# Patient Record
Sex: Female | Born: 1940 | Race: Black or African American | Hispanic: No | Smoking: Former smoker
Health system: Southern US, Community
[De-identification: ages and names within clinical notes are randomized; demographics above are authoritative.]

## PROBLEM LIST (undated history)

## (undated) DIAGNOSIS — I10 Essential (primary) hypertension: Secondary | ICD-10-CM

## (undated) DIAGNOSIS — E119 Type 2 diabetes mellitus without complications: Secondary | ICD-10-CM

## (undated) DIAGNOSIS — J45909 Unspecified asthma, uncomplicated: Secondary | ICD-10-CM

## (undated) DIAGNOSIS — M1990 Unspecified osteoarthritis, unspecified site: Secondary | ICD-10-CM

## (undated) DIAGNOSIS — J302 Other seasonal allergic rhinitis: Secondary | ICD-10-CM

## (undated) DIAGNOSIS — K219 Gastro-esophageal reflux disease without esophagitis: Secondary | ICD-10-CM

## (undated) DIAGNOSIS — M75101 Unspecified rotator cuff tear or rupture of right shoulder, not specified as traumatic: Secondary | ICD-10-CM

## (undated) DIAGNOSIS — Z860100 Personal history of colon polyps, unspecified: Secondary | ICD-10-CM

## (undated) DIAGNOSIS — I499 Cardiac arrhythmia, unspecified: Secondary | ICD-10-CM

## (undated) DIAGNOSIS — J189 Pneumonia, unspecified organism: Secondary | ICD-10-CM

## (undated) HISTORY — PX: EYE SURGERY: SHX253

## (undated) HISTORY — PX: TONSILLECTOMY: SUR1361

## (undated) HISTORY — PX: VAGINAL HYSTERECTOMY: SUR661

## (undated) HISTORY — PX: BACK SURGERY: SHX140

## (undated) HISTORY — PX: INGUINAL HERNIA REPAIR: SHX194

## (undated) HISTORY — PX: HERNIA REPAIR: SHX51

## (undated) HISTORY — PX: LUMBAR DISC SURGERY: SHX700

## (undated) HISTORY — DX: Unspecified osteoarthritis, unspecified site: M19.90

## (undated) HISTORY — PX: CHOLECYSTECTOMY OPEN: SUR202

---

## 2004-02-25 ENCOUNTER — Encounter: Payer: Self-pay | Admitting: Internal Medicine

## 2004-09-24 ENCOUNTER — Ambulatory Visit: Payer: Self-pay | Admitting: Internal Medicine

## 2004-12-23 ENCOUNTER — Ambulatory Visit: Payer: Self-pay | Admitting: Internal Medicine

## 2005-01-26 ENCOUNTER — Ambulatory Visit: Payer: Self-pay | Admitting: Internal Medicine

## 2005-01-27 ENCOUNTER — Ambulatory Visit: Payer: Self-pay | Admitting: Internal Medicine

## 2005-02-26 ENCOUNTER — Encounter: Payer: Self-pay | Admitting: Internal Medicine

## 2005-02-26 ENCOUNTER — Ambulatory Visit: Payer: Self-pay | Admitting: Internal Medicine

## 2005-03-31 ENCOUNTER — Ambulatory Visit: Payer: Self-pay | Admitting: Internal Medicine

## 2005-04-30 ENCOUNTER — Ambulatory Visit: Payer: Self-pay | Admitting: Internal Medicine

## 2005-08-31 ENCOUNTER — Ambulatory Visit: Payer: Self-pay | Admitting: Internal Medicine

## 2006-02-08 ENCOUNTER — Ambulatory Visit: Payer: Self-pay | Admitting: Internal Medicine

## 2006-06-07 ENCOUNTER — Ambulatory Visit: Payer: Self-pay | Admitting: Internal Medicine

## 2006-09-30 ENCOUNTER — Ambulatory Visit: Payer: Self-pay | Admitting: Internal Medicine

## 2006-09-30 LAB — CONVERTED CEMR LAB
AST: 21 units/L (ref 0–37)
Albumin: 4.2 g/dL (ref 3.5–5.2)
Alkaline Phosphatase: 53 units/L (ref 39–117)
BUN: 15 mg/dL (ref 6–23)
CO2: 30 meq/L (ref 19–32)
Chloride: 101 meq/L (ref 96–112)
Creatinine, Ser: 0.8 mg/dL (ref 0.4–1.2)
HCT: 39.2 % (ref 36.0–46.0)
MCHC: 34 g/dL (ref 30.0–36.0)
MCV: 91 fL (ref 78.0–100.0)
Potassium: 3.9 meq/L (ref 3.5–5.1)
Sodium: 141 meq/L (ref 135–145)
Total Bilirubin: 0.6 mg/dL (ref 0.3–1.2)
Total Protein: 6.7 g/dL (ref 6.0–8.3)
VLDL: 16 mg/dL (ref 0–40)
WBC: 5.9 10*3/uL (ref 4.5–10.5)

## 2007-04-04 ENCOUNTER — Ambulatory Visit: Payer: Self-pay | Admitting: Internal Medicine

## 2007-04-07 ENCOUNTER — Encounter: Payer: Self-pay | Admitting: Internal Medicine

## 2007-04-07 DIAGNOSIS — I10 Essential (primary) hypertension: Secondary | ICD-10-CM | POA: Insufficient documentation

## 2007-04-07 DIAGNOSIS — J45909 Unspecified asthma, uncomplicated: Secondary | ICD-10-CM

## 2007-04-07 DIAGNOSIS — J309 Allergic rhinitis, unspecified: Secondary | ICD-10-CM

## 2007-04-07 DIAGNOSIS — Z860100 Personal history of colon polyps, unspecified: Secondary | ICD-10-CM | POA: Insufficient documentation

## 2007-04-07 DIAGNOSIS — K219 Gastro-esophageal reflux disease without esophagitis: Secondary | ICD-10-CM

## 2007-10-04 ENCOUNTER — Ambulatory Visit: Payer: Self-pay | Admitting: Internal Medicine

## 2007-12-14 ENCOUNTER — Ambulatory Visit: Payer: Self-pay | Admitting: Internal Medicine

## 2008-02-05 ENCOUNTER — Encounter: Payer: Self-pay | Admitting: Internal Medicine

## 2008-02-06 ENCOUNTER — Ambulatory Visit: Payer: Self-pay | Admitting: Internal Medicine

## 2008-02-06 LAB — CONVERTED CEMR LAB
ALT: 33 units/L (ref 0–35)
Albumin: 4.1 g/dL (ref 3.5–5.2)
BUN: 13 mg/dL (ref 6–23)
Basophils Relative: 0 % (ref 0.0–1.0)
Bilirubin, Direct: 0.1 mg/dL (ref 0.0–0.3)
CO2: 33 meq/L — ABNORMAL HIGH (ref 19–32)
Calcium: 9.9 mg/dL (ref 8.4–10.5)
Cholesterol: 241 mg/dL (ref 0–200)
Creatinine, Ser: 0.7 mg/dL (ref 0.4–1.2)
Eosinophils Relative: 2.4 % (ref 0.0–5.0)
GFR calc Af Amer: 107 mL/min
Glucose, Bld: 101 mg/dL — ABNORMAL HIGH (ref 70–99)
HCT: 40.4 % (ref 36.0–46.0)
Hemoglobin: 13.2 g/dL (ref 12.0–15.0)
Lymphocytes Relative: 33.6 % (ref 12.0–46.0)
Monocytes Absolute: 0.4 10*3/uL (ref 0.1–1.0)
Monocytes Relative: 5.5 % (ref 3.0–12.0)
Neutro Abs: 4.1 10*3/uL (ref 1.4–7.7)
RBC: 4.38 M/uL (ref 3.87–5.11)
RDW: 13.1 % (ref 11.5–14.6)
TSH: 0.67 microintl units/mL (ref 0.35–5.50)
Total CHOL/HDL Ratio: 3
Total Protein: 7.5 g/dL (ref 6.0–8.3)
VLDL: 16 mg/dL (ref 0–40)

## 2008-08-07 ENCOUNTER — Ambulatory Visit: Payer: Self-pay | Admitting: Internal Medicine

## 2009-03-14 ENCOUNTER — Ambulatory Visit: Payer: Self-pay | Admitting: Internal Medicine

## 2009-04-10 ENCOUNTER — Encounter: Admission: RE | Admit: 2009-04-10 | Discharge: 2009-04-10 | Payer: Self-pay | Admitting: Orthopedic Surgery

## 2009-09-16 ENCOUNTER — Ambulatory Visit: Payer: Self-pay | Admitting: Internal Medicine

## 2009-09-16 DIAGNOSIS — M549 Dorsalgia, unspecified: Secondary | ICD-10-CM | POA: Insufficient documentation

## 2009-09-16 LAB — CONVERTED CEMR LAB
ALT: 31 units/L (ref 0–35)
BUN: 13 mg/dL (ref 6–23)
Basophils Relative: 0.4 % (ref 0.0–3.0)
CO2: 27 meq/L (ref 19–32)
Chloride: 105 meq/L (ref 96–112)
Cholesterol: 219 mg/dL — ABNORMAL HIGH (ref 0–200)
Direct LDL: 137 mg/dL
Eosinophils Absolute: 0.2 10*3/uL (ref 0.0–0.7)
Eosinophils Relative: 3.5 % (ref 0.0–5.0)
HCT: 39.7 % (ref 36.0–46.0)
Lymphs Abs: 2.3 10*3/uL (ref 0.7–4.0)
MCHC: 32.8 g/dL (ref 30.0–36.0)
MCV: 93.7 fL (ref 78.0–100.0)
Monocytes Absolute: 0.5 10*3/uL (ref 0.1–1.0)
Platelets: 311 10*3/uL (ref 150.0–400.0)
Potassium: 3.6 meq/L (ref 3.5–5.1)
RBC: 4.24 M/uL (ref 3.87–5.11)
TSH: 1.09 microintl units/mL (ref 0.35–5.50)
Total Protein: 7.7 g/dL (ref 6.0–8.3)
VLDL: 23.2 mg/dL (ref 0.0–40.0)
WBC: 6.4 10*3/uL (ref 4.5–10.5)

## 2010-02-17 ENCOUNTER — Encounter (INDEPENDENT_AMBULATORY_CARE_PROVIDER_SITE_OTHER): Payer: Self-pay | Admitting: *Deleted

## 2010-03-12 ENCOUNTER — Ambulatory Visit: Payer: Self-pay | Admitting: Internal Medicine

## 2010-09-09 ENCOUNTER — Ambulatory Visit: Payer: Self-pay | Admitting: Internal Medicine

## 2010-09-09 DIAGNOSIS — K089 Disorder of teeth and supporting structures, unspecified: Secondary | ICD-10-CM | POA: Insufficient documentation

## 2010-09-19 ENCOUNTER — Encounter: Payer: Self-pay | Admitting: Internal Medicine

## 2010-09-25 ENCOUNTER — Encounter: Payer: Self-pay | Admitting: Internal Medicine

## 2010-11-16 LAB — CONVERTED CEMR LAB
ALT: 16 units/L (ref 0–35)
BUN: 19 mg/dL (ref 6–23)
Basophils Absolute: 0.1 10*3/uL (ref 0.0–0.1)
CO2: 31 meq/L (ref 19–32)
Chloride: 101 meq/L (ref 96–112)
Direct LDL: 118.1 mg/dL
Eosinophils Absolute: 0.2 10*3/uL (ref 0.0–0.7)
Eosinophils Relative: 2.6 % (ref 0.0–5.0)
Glucose, Bld: 85 mg/dL (ref 70–99)
HCT: 37.8 % (ref 36.0–46.0)
Lymphs Abs: 2.4 10*3/uL (ref 0.7–4.0)
MCHC: 33.8 g/dL (ref 30.0–36.0)
MCV: 92.5 fL (ref 78.0–100.0)
Monocytes Absolute: 0.5 10*3/uL (ref 0.1–1.0)
Platelets: 305 10*3/uL (ref 150.0–400.0)
Potassium: 3.8 meq/L (ref 3.5–5.1)
RDW: 13.9 % (ref 11.5–14.6)
TSH: 1.52 microintl units/mL (ref 0.35–5.50)
Total Bilirubin: 0.4 mg/dL (ref 0.3–1.2)

## 2010-11-18 NOTE — Assessment & Plan Note (Signed)
Summary: 6 month rov/njr   Vital Signs:  Patient profile:   70 year old female Weight:      154 pounds Temp:     98.0 degrees F oral BP sitting:   160 / 100  (left arm) Cuff size:   regular  Vitals Entered By: Duard Brady LPN (September 09, 2010 8:13 AM) CC: 6 mos rov - doing ok - pain r/t dental procedure Is Patient Diabetic? No   CC:  6 mos rov - doing ok - pain r/t dental procedure.  History of Present Illness: 70 year old patient who is being followed closely by her dentist due to a dental pain.  She apparent has had a recent bone grafting and is having considerable pain.  She is on Vicodin and also having considerable nausea.  The pain has not been well controlled.  She is also on ibuprofen.  Seen today for follow-up of her asthma and hypertension.  Earlier in the week,  Her blood pressure was low normal.  She has not slept much last night due to the pain.  Her asthma has been stable.  She also has gastroesophageal reflux disease, currently controlled with lifestyle issues.  Allergies: 1)  ! Asa 2)  ! Catapres  Past History:  Past Medical History: Reviewed history from 04/07/2007 and no changes required. Allergic rhinitis Asthma Colonic polyps, hx of GERD Hypertension  Past Surgical History: Reviewed history from 02/06/2008 and no changes required. Cholecystectomy Hysterectomy Lumbar laminectomy colonoscopy May 2006  Review of Systems  The patient denies anorexia, fever, weight loss, weight gain, vision loss, decreased hearing, hoarseness, chest pain, syncope, dyspnea on exertion, peripheral edema, prolonged cough, headaches, hemoptysis, abdominal pain, melena, hematochezia, severe indigestion/heartburn, hematuria, incontinence, genital sores, muscle weakness, suspicious skin lesions, transient blindness, difficulty walking, depression, unusual weight change, abnormal bleeding, enlarged lymph nodes, angioedema, and breast masses.    Physical Exam  General:   overweight-appearing.  140/96 Head:  Normocephalic and atraumatic without obvious abnormalities. No apparent alopecia or balding. Eyes:  No corneal or conjunctival inflammation noted. EOMI. Perrla. Funduscopic exam benign, without hemorrhages, exudates or papilledema. Vision grossly normal. Mouth:  Oral mucosa and oropharynx without lesions or exudates.  Teeth in good repair. Neck:  No deformities, masses, or tenderness noted. Lungs:  Normal respiratory effort, chest expands symmetrically. Lungs are clear to auscultation, no crackles or wheezes. Heart:  Normal rate and regular rhythm. S1 and S2 normal without gallop, murmur, click, rub or other extra sounds. Abdomen:  Bowel sounds positive,abdomen soft and non-tender without masses, organomegaly or hernias noted. Msk:  No deformity or scoliosis noted of thoracic or lumbar spine.   Pulses:  R and L carotid,radial,femoral,dorsalis pedis and posterior tibial pulses are full and equal bilaterally Extremities:  No clubbing, cyanosis, edema, or deformity noted with normal full range of motion of all joints.   Skin:  Intact without suspicious lesions or rashes Cervical Nodes:  No lymphadenopathy noted   Impression & Recommendations:  Problem # 1:  HYPERTENSION (ICD-401.9)  Her updated medication list for this problem includes:    Hydrochlorothiazide 25 Mg Tabs (Hydrochlorothiazide) .Marland Kitchen... 1 once daily    Benazepril Hcl 40 Mg Tabs (Benazepril hcl) .Marland Kitchen... 1 once daily  Problem # 2:  GERD (ICD-530.81)  Problem # 3:  ASTHMA (ICD-493.90)  Her updated medication list for this problem includes:    Albuterol 90 Mcg/act Aers (Albuterol) .Marland Kitchen... As needed    Advair Diskus 250-50 Mcg/dose Misc (Fluticasone-salmeterol) ..... Use two times a day as needed  Problem # 4:  UNSPECIFIED DISORDER TEETH&SUPPORTING STRUCTURES (ICD-525.9) will give a prescription for a small quantity of oxycodone ; will follow-up with her dentist  Complete Medication List: 1)   Allegra-d 12 Hour 60-120 Mg Tb12 (Fexofenadine-pseudoephedrine) 2)  Hydrochlorothiazide 25 Mg Tabs (Hydrochlorothiazide) .Marland Kitchen.. 1 once daily 3)  Benazepril Hcl 40 Mg Tabs (Benazepril hcl) .Marland Kitchen.. 1 once daily 4)  Albuterol 90 Mcg/act Aers (Albuterol) .... As needed 5)  Advair Diskus 250-50 Mcg/dose Misc (Fluticasone-salmeterol) .... Use two times a day as needed 6)  Celebrex 200 Mg Caps (Celecoxib) .Marland Kitchen.. 1 once daily 7)  Tramadol Hcl 50 Mg Tabs (Tramadol hcl) .... One every 6 hours for pain 8)  Amoxicillin 500 Mg Caps (Amoxicillin) .... Three times a day - dental procedure 9)  Vicodin Es 7.5-750 Mg Tabs (Hydrocodone-acetaminophen) .... As needed - dental procdure 10)  Oxycodone-acetaminophen 5-500 Mg Caps (Oxycodone-acetaminophen) .... One every 4 hours as needed for pain  Patient Instructions: 1)  Please schedule a follow-up appointment in 4 months. 2)  Limit your Sodium (Salt). 3)  It is important that you exercise regularly at least 20 minutes 5 times a week. If you develop chest pain, have severe difficulty breathing, or feel very tired , stop exercising immediately and seek medical attention. 4)  You need to lose weight. Consider a lower calorie diet and regular exercise.  5)  Check your Blood Pressure regularly. If it is above: 150/90 you should make an appointment. Prescriptions: OXYCODONE-ACETAMINOPHEN 5-500 MG CAPS (OXYCODONE-ACETAMINOPHEN) one every 4 hours as needed for pain  #30 x 0   Entered and Authorized by:   Gordy Savers  MD   Signed by:   Gordy Savers  MD on 09/09/2010   Method used:   Print then Give to Patient   RxID:   [id number] TRAMADOL HCL 50 MG TABS (TRAMADOL HCL) one every 6 hours for pain  #90 x 6   Entered and Authorized by:   Gordy Savers  MD   Signed by:   Gordy Savers  MD on 09/09/2010   Method used:   Print then Give to Patient   RxID:   [id number] CELEBREX 200 MG CAPS (CELECOXIB) 1 once daily  #90 x 4   Entered and  Authorized by:   Gordy Savers  MD   Signed by:   Gordy Savers  MD on 09/09/2010   Method used:   Print then Give to Patient   RxID:   [id number] ADVAIR DISKUS 250-50 MCG/DOSE  MISC (FLUTICASONE-SALMETEROL) use two times a day as needed  #60 Each x 6   Entered and Authorized by:   Gordy Savers  MD   Signed by:   Gordy Savers  MD on 09/09/2010   Method used:   Print then Give to Patient   RxID:   [id number] BENAZEPRIL HCL 40 MG  TABS (BENAZEPRIL HCL) 1 once daily  #90 Tablet x 6   Entered and Authorized by:   Gordy Savers  MD   Signed by:   Gordy Savers  MD on 09/09/2010   Method used:   Print then Give to Patient   RxID:   [id number] HYDROCHLOROTHIAZIDE 25 MG  TABS (HYDROCHLOROTHIAZIDE) 1 once daily  #90 Tablet x 6   Entered and Authorized by:   Gordy Savers  MD   Signed by:   Gordy Savers  MD on 09/09/2010   Method used:   Print then Give to Patient  RxID:   [id number]    Orders Added: 1)  Est. Patient Level IV [14782]

## 2010-11-18 NOTE — Letter (Signed)
Summary: Colonoscopy Letter  [facility]  [address] [place], [place] [address]   Phone: [phone]  Fax: [phone]      Feb 17, 2010 MRN: [id number]   [facility] [address] [place], [place]  [address]   Dear [name],   According to your medical record, it is time for you to schedule a Colonoscopy. The American Cancer Society recommends this procedure as a method to detect early colon cancer. Patients with a family history of colon cancer, or a personal history of colon polyps or inflammatory bowel disease are at increased risk.  This letter has beeen generated based on the recommendations made at the time of your procedure. If you feel that in your particular situation this may no longer apply, please contact our office.  Please call our office at [phone] to schedule this appointment or to update your records at your earliest convenience.  Thank you for cooperating with [place] to provide you with the very best care possible.   Sincerely,    Wilhemina Bonito. Marina Goodell, M.D.  [facility] [phone]

## 2010-11-18 NOTE — Assessment & Plan Note (Signed)
Summary: EMP PT WILL FAST//SLM   Vital Signs:  Patient profile:   70 year old female Height:      60.5 inches Weight:      153 pounds Temp:     97.8 degrees F oral  Vitals Entered By: Duard Brady LPN (Mar 12, 2010 8:44 AM) CC: cpx - doing well Is Patient Diabetic? No   CC:  cpx - doing well.  History of Present Illness: 70 year old patient who is seen today for a health maintenance examination.  She has a history of asthma, which has been stable.  She has treated hypertension.  No concerns or complaints.  She does have a history of colonic polyps and is due for a follow-up colonoscopy soon.  She is scheduled for a mammogram in two days  Here for Medicare AWV:  1.   Risk factors based on Past M, S, F history:  as factors include hypertension, and a family history of premature coronary artery disease.  Females in her family have enjoyed longevity often in excess of 100 years 2.   Physical Activities: active with gardening.  No exercise limitations 3.   Depression/mood: none 4.   Hearing: no impairment 5.   ADL's: independent in all aspects of daily living 6.   Fall Risk: low 7.   Home Safety: the proms identifying 8.   Height, weight, &visual acuity:height and weight stable.  No visual deficits 9.   Counseling: mammogram and colonoscopy to be scheduled 10.   Labs ordered based on risk factors: laboratory profile, including lipid panel will be reviewed 11.           Referral Coordination- colonoscopy referral 12.           Care Plan- heart healthy diet, and more active lifestyle, discussed and encouraged 13.            Cognitive Assessment- alert and oriented, with normal affect.  No cognitive dysfunction   Preventive Screening-Counseling & Management  Alcohol-Tobacco     Smoking Status: quit  Allergies: 1)  ! Asa 2)  ! Catapres  Past History:  Past Medical History: Reviewed history from 04/07/2007 and no changes required. Allergic rhinitis Asthma Colonic  polyps, hx of GERD Hypertension  Past Surgical History: Reviewed history from 02/06/2008 and no changes required. Cholecystectomy Hysterectomy Lumbar laminectomy colonoscopy May 2006  Family History: Reviewed history from 02/06/2008 and no changes required. father died age 34,myocardial infarction mother age 53 mild DJD-history breast cancer at age 20 Four half-brothers four half-sisters in good health  Social History: Reviewed history from 10/04/2007 and no changes required. Retired Married  Review of Systems  The patient denies anorexia, fever, weight loss, weight gain, vision loss, decreased hearing, hoarseness, chest pain, syncope, dyspnea on exertion, peripheral edema, prolonged cough, headaches, hemoptysis, abdominal pain, melena, hematochezia, severe indigestion/heartburn, hematuria, incontinence, genital sores, muscle weakness, suspicious skin lesions, transient blindness, difficulty walking, depression, unusual weight change, abnormal bleeding, enlarged lymph nodes, angioedema, and breast masses.    Physical Exam  General:  overweight-appearing.  130/82 Head:  Normocephalic and atraumatic without obvious abnormalities. No apparent alopecia or balding. Eyes:  No corneal or conjunctival inflammation noted. EOMI. Perrla. Funduscopic exam benign, without hemorrhages, exudates or papilledema. Vision grossly normal. Ears:  External ear exam shows no significant lesions or deformities.  Otoscopic examination reveals clear canals, tympanic membranes are intact bilaterally without bulging, retraction, inflammation or discharge. Hearing is grossly normal bilaterally. Nose:  External nasal examination shows no deformity or inflammation. Nasal  mucosa are pink and moist without lesions or exudates. Neck:  No deformities, masses, or tenderness noted. Chest Wall:  No deformities, masses, or tenderness noted. Breasts:  No mass, nodules, thickening, tenderness, bulging, retraction,  inflamation, nipple discharge or skin changes noted.   Lungs:  Normal respiratory effort, chest expands symmetrically. Lungs are clear to auscultation, no crackles or wheezes. Heart:  Normal rate and regular rhythm. S1 and S2 normal without gallop, murmur, click, rub or other extra sounds. Abdomen:  Bowel sounds positive,abdomen soft and non-tender without masses, organomegaly or hernias noted. Rectal:  No external abnormalities noted. Normal sphincter tone. No rectal masses or tenderness. Genitalia:  status post hysterectomynormal introitus, no external lesions, and mucosa pink and moist.  normal introitus, no external lesions, and mucosa pink and moist.   Msk:  No deformity or scoliosis noted of thoracic or lumbar spine.   Pulses:  R and L carotid,radial,femoral,dorsalis pedis and posterior tibial pulses are full and equal bilaterally Extremities:  No clubbing, cyanosis, edema, or deformity noted with normal full range of motion of all joints.   Neurologic:  No cranial nerve deficits noted. Station and gait are normal. Plantar reflexes are down-going bilaterally. DTRs are symmetrical throughout. Sensory, motor and coordinative functions appear intact. Skin:  Intact without suspicious lesions or rashes Cervical Nodes:  No lymphadenopathy noted Axillary Nodes:  No palpable lymphadenopathy Inguinal Nodes:  No significant adenopathy Psych:  Cognition and judgment appear intact. Alert and cooperative with normal attention span and concentration. No apparent delusions, illusions, hallucinations   Impression & Recommendations:  Problem # 1:  PREVENTIVE HEALTH CARE (ICD-V70.0)  Orders: First annual wellness visit with prevention plan  (Q0347)  Problem # 2:  GERD (ICD-530.81)  Orders: Prescription Created Electronically [id number]) Venipuncture (63875) TLB-Lipid Panel (80061-LIPID) TLB-BMP (Basic Metabolic Panel-BMET) (80048-METABOL) TLB-CBC Platelet - w/Differential  (85025-CBCD) TLB-Hepatic/Liver Function Pnl (80076-HEPATIC) TLB-TSH (Thyroid Stimulating Hormone) (84443-TSH)  Problem # 3:  ASTHMA (ICD-493.90)  Her updated medication list for this problem includes:    Albuterol 90 Mcg/act Aers (Albuterol) .Marland Kitchen... As needed    Advair Diskus 250-50 Mcg/dose Misc (Fluticasone-salmeterol) ..... Use two times a day as needed  Her updated medication list for this problem includes:    Albuterol 90 Mcg/act Aers (Albuterol) .Marland Kitchen... As needed    Advair Diskus 250-50 Mcg/dose Misc (Fluticasone-salmeterol) ..... Use two times a day as needed  Problem # 4:  COLONIC POLYPS, HX OF (ICD-V12.72)  Orders: Venipuncture (64332) TLB-BMP (Basic Metabolic Panel-BMET) (80048-METABOL) TLB-CBC Platelet - w/Differential (85025-CBCD) TLB-Hepatic/Liver Function Pnl (80076-HEPATIC) TLB-TSH (Thyroid Stimulating Hormone) (84443-TSH)  Problem # 5:  HYPERTENSION (ICD-401.9)  Her updated medication list for this problem includes:    Hydrochlorothiazide 25 Mg Tabs (Hydrochlorothiazide) .Marland Kitchen... 1 once daily    Benazepril Hcl 40 Mg Tabs (Benazepril hcl) .Marland Kitchen... 1 once daily  Orders: EKG w/ Interpretation (93000)  Her updated medication list for this problem includes:    Hydrochlorothiazide 25 Mg Tabs (Hydrochlorothiazide) .Marland Kitchen... 1 once daily    Benazepril Hcl 40 Mg Tabs (Benazepril hcl) .Marland Kitchen... 1 once daily  Complete Medication List: 1)  Allegra-d 12 Hour 60-120 Mg Tb12 (Fexofenadine-pseudoephedrine) 2)  Hydrochlorothiazide 25 Mg Tabs (Hydrochlorothiazide) .Marland Kitchen.. 1 once daily 3)  Benazepril Hcl 40 Mg Tabs (Benazepril hcl) .Marland Kitchen.. 1 once daily 4)  Albuterol 90 Mcg/act Aers (Albuterol) .... As needed 5)  Advair Diskus 250-50 Mcg/dose Misc (Fluticasone-salmeterol) .... Use two times a day as needed 6)  Celebrex 200 Mg Caps (Celecoxib) .Marland Kitchen.. 1 once daily 7)  Tramadol Hcl  50 Mg Tabs (Tramadol hcl) .... One every 6 hours for pain  Patient Instructions: 1)  Please schedule a follow-up  appointment in 6 months. 2)  Limit your Sodium (Salt) to less than 2 grams a day(slightly less than 1/2 a teaspoon) to prevent fluid retention, swelling, or worsening of symptoms. 3)  Avoid foods high in acid (tomatoes, citrus juices, spicy foods). Avoid eating within two hours of lying down or before exercising. Do not over eat; try smaller more frequent meals. Elevate head of bed twelve inches when sleeping. 4)  It is important that you exercise regularly at least 20 minutes 5 times a week. If you develop chest pain, have severe difficulty breathing, or feel very tired , stop exercising immediately and seek medical attention. 5)  You need to lose weight. Consider a lower calorie diet and regular exercise.  6)  Schedule a colonoscopy/sigmoidoscopy to help detect colon cancer. 7)  Take calcium +Vitamin D daily. 8)  Check your Blood Pressure regularly. If it is above: 150/90 you should make an appointment. Prescriptions: TRAMADOL HCL 50 MG TABS (TRAMADOL HCL) one every 6 hours for pain  #90 x 6   Entered and Authorized by:   Gordy Savers  MD   Signed by:   Gordy Savers  MD on 03/12/2010   Method used:   Electronically to        [facility] # [phone]* (retail)       [address] [place], [place]  [address]       Ph: [phone]       Fax: [phone]   RxID:   [id number] CELEBREX 200 MG CAPS (CELECOXIB) 1 once daily  #90 x 4   Entered and Authorized by:   Gordy Savers  MD   Signed by:   Gordy Savers  MD on 03/12/2010   Method used:   Electronically to        [facility] # [phone]* (retail)       [address] [place], [place]  [address]       Ph: [phone]       Fax: [phone]   RxID:   [id number] ADVAIR DISKUS 250-50 MCG/DOSE  MISC (FLUTICASONE-SALMETEROL) use two times a day as needed  #60 Each x 3   Entered and Authorized by:   Gordy Savers  MD   Signed by:   Gordy Savers  MD on 03/12/2010   Method used:    Electronically to        [facility] # [phone]* (retail)       [address] [place], [place]  [address]       Ph: [phone]       Fax: [phone]   RxID:   [id number] BENAZEPRIL HCL 40 MG  TABS (BENAZEPRIL HCL) 1 once daily  #90 x 6   Entered and Authorized by:   Gordy Savers  MD   Signed by:   Gordy Savers  MD on 03/12/2010   Method used:   Electronically to        [facility] # [phone]* (retail)       [address] [place], [place]  [address]       Ph: [phone]       Fax: [phone]  RxID:   [id number] HYDROCHLOROTHIAZIDE 25 MG  TABS (HYDROCHLOROTHIAZIDE) 1 once daily  #90 x 6   Entered and Authorized by:   Gordy Savers  MD   Signed by:   Gordy Savers  MD on 03/12/2010   Method used:   Electronically to        [facility] # [phone]* (retail)       [address] [place], [place]  [address]       Ph: [phone]       Fax: [phone]   RxID:   [id number]

## 2010-12-19 ENCOUNTER — Ambulatory Visit (INDEPENDENT_AMBULATORY_CARE_PROVIDER_SITE_OTHER): Payer: Medicare Other | Admitting: Internal Medicine

## 2010-12-19 ENCOUNTER — Encounter: Payer: Self-pay | Admitting: Internal Medicine

## 2010-12-19 DIAGNOSIS — M1990 Unspecified osteoarthritis, unspecified site: Secondary | ICD-10-CM

## 2010-12-19 DIAGNOSIS — J45909 Unspecified asthma, uncomplicated: Secondary | ICD-10-CM

## 2010-12-19 DIAGNOSIS — R197 Diarrhea, unspecified: Secondary | ICD-10-CM

## 2010-12-19 DIAGNOSIS — I10 Essential (primary) hypertension: Secondary | ICD-10-CM

## 2010-12-19 LAB — CBC WITH DIFFERENTIAL/PLATELET
Eosinophils Absolute: 0.2 10*3/uL (ref 0.0–0.7)
Eosinophils Relative: 2.6 % (ref 0.0–5.0)
MCHC: 33.8 g/dL (ref 30.0–36.0)
MCV: 91.6 fl (ref 78.0–100.0)
Monocytes Absolute: 0.7 10*3/uL (ref 0.1–1.0)
Neutrophils Relative %: 51.3 % (ref 43.0–77.0)
Platelets: 300 10*3/uL (ref 150.0–400.0)
WBC: 6.3 10*3/uL (ref 4.5–10.5)

## 2010-12-19 LAB — BASIC METABOLIC PANEL
GFR: 84.68 mL/min (ref >60.00)
Potassium: 3.7 mEq/L (ref 3.5–5.1)
Sodium: 139 mEq/L (ref 135–145)

## 2010-12-19 MED ORDER — DIPHENOXYLATE-ATROPINE 2.5-0.025 MG PO TABS: 1.0000 | ORAL_TABLET | Freq: Four times a day (QID) | ORAL | Status: DC | PRN

## 2010-12-19 NOTE — Progress Notes (Signed)
  Subjective:    Patient ID: Kristy [name], female    DOB: September 22, 1941, 70 y.o.   MRN: [id number]  HPI   70 year old patient who has a history of osteoarthritis. She takes Celebrex every other day. She has a history of hypertension which has been controlled on diuretic therapy as well as Lotensin. For the past 5 days she has had frequent watery diarrhea. She states that she had 12 loose bowel movements through the night over the past   12  Hours. There's been no fever nausea or vomiting. She has been maintained on a clear to full liquid diet which he tolerates well and has been consuming considerable fluids. She does have a history of colonic polyps and is scheduled for redo colonoscopy soon. No recent antibiotic use but has used ampicillin for a dental problem in November   Review of Systems  Constitutional: Negative.   HENT: Negative for hearing loss, congestion, sore throat, rhinorrhea, dental problem, sinus pressure and tinnitus.   Eyes: Negative for pain, discharge and visual disturbance.  Respiratory: Negative for cough and shortness of breath.   Cardiovascular: Negative for chest pain, palpitations and leg swelling.  Gastrointestinal: Positive for diarrhea. Negative for nausea, vomiting, abdominal pain, constipation, blood in stool, abdominal distention, anal bleeding and rectal pain.  Genitourinary: Negative for dysuria, urgency, frequency, hematuria, flank pain, vaginal bleeding, vaginal discharge, difficulty urinating, vaginal pain and pelvic pain.  Musculoskeletal: Negative for joint swelling, arthralgias and gait problem.  Skin: Negative for rash.  Neurological: Negative for dizziness, syncope, speech difficulty, weakness, numbness and headaches.  Hematological: Negative for adenopathy.  Psychiatric/Behavioral: Negative for behavioral problems, dysphoric mood and agitation. The patient is not nervous/anxious.        Objective:   Physical Exam  Constitutional: She is oriented to  person, place, and time. She appears well-developed and well-nourished. No distress.        Blood pressure 120/78  No distress  No tachycardia  HENT:  Head: Normocephalic.  Right Ear: External ear normal.  Left Ear: External ear normal.  Mouth/Throat: Oropharynx is clear and moist.  Eyes: Conjunctivae and EOM are normal. Pupils are equal, round, and reactive to light.  Neck: Normal range of motion. Neck supple. No thyromegaly present.  Cardiovascular: Normal rate, regular rhythm, normal heart sounds and intact distal pulses.   Pulmonary/Chest: Effort normal and breath sounds normal.  Abdominal: Soft. Bowel sounds are normal. She exhibits no distension and no mass. There is no tenderness. There is no rebound and no guarding.  Musculoskeletal: Normal range of motion.  Lymphadenopathy:    She has no cervical adenopathy.  Neurological: She is alert and oriented to person, place, and time.  Skin: Skin is warm and dry. No rash noted.  Psychiatric: She has a normal mood and affect. Her behavior is normal.          Assessment & Plan:   diarrhea.   We'll discontinue Celebrex and her blood pressure medications short-term. Will place on a clear liquid diet and advance as tolerated Will treat with Lomotil for diarrhea. 2 weeks of Align  Samples dispensed. CBC and electrolytes will be checked today. She will call Monday if not resolved or markedly improved  Hypertension- hold blood pressure medications at this time in view of her risk of volume repletion  Osteoarthritis- hold Celebrex at this time in view of her risk of volume of the lesion

## 2010-12-19 NOTE — Patient Instructions (Signed)
Drink clear liquids only for the next 24 hours,  then slowly add other liquids and food as you  tolerate them  take antidiarrheal medication as directed   Hold Celebrex hydrochlorothiazide and benazepril   call if worsening abdominal pain nausea or vomiting or any clinical worsening  Align  One daily for 2 weeks

## 2010-12-23 ENCOUNTER — Encounter (INDEPENDENT_AMBULATORY_CARE_PROVIDER_SITE_OTHER): Payer: Self-pay | Admitting: *Deleted

## 2010-12-30 NOTE — Letter (Signed)
Summary: Pre Visit Letter Revised  [facility]  [address] [place], [place] [address]   Phone: [phone]  Fax: [phone]        12/23/2010 MRN: [id number] [facility] [address] [place], [place]  [address]  [place]             Procedure Date:  01/20/2011 @ 1:30   Recall colon-Dr. Marina Goodell   Welcome to the Gastroenterology Division at [facility].    You are scheduled to see a nurse for your pre-procedure visit on 01/06/2011 at 2:00 on the 3rd floor at [facility], [address].  We ask that you try to arrive at our office 15 minutes prior to your appointment time to allow for check-in.  Please take a minute to review the attached form.  If you answer "Yes" to one or more of the questions on the first page, we ask that you call the person listed at your earliest opportunity.  If you answer "No" to all of the questions, please complete the rest of the form and bring it to your appointment.    Your nurse visit will consist of discussing your medical and surgical history, your immediate family medical history, and your medications.   If you are unable to list all of your medications on the form, please bring the medication bottles to your appointment and we will list them.  We will need to be aware of both prescribed and over the counter drugs.  We will need to know exact dosage information as well.    Please be prepared to read and sign documents such as consent forms, a financial agreement, and acknowledgement forms.  If necessary, and with your consent, a friend or relative is welcome to sit-in on the nurse visit with you.  Please bring your insurance card so that we may make a copy of it.  If your insurance requires a referral to see a specialist, please bring your referral form from your primary care physician.  No co-pay is required for this nurse visit.     If you cannot keep your appointment, please call [phone] to cancel or reschedule prior to  your appointment date.  This allows [place] the opportunity to schedule an appointment for another patient in need of care.    Thank you for choosing [facility] for your medical needs.  We appreciate the opportunity to care for you.  Please visit [place] at our website  to learn more about our practice.  Sincerely, The Gastroenterology Division

## 2011-01-06 ENCOUNTER — Ambulatory Visit (AMBULATORY_SURGERY_CENTER): Payer: Medicare Other

## 2011-01-06 VITALS — Ht 62.0 in | Wt 156.3 lb

## 2011-01-06 DIAGNOSIS — Z1211 Encounter for screening for malignant neoplasm of colon: Secondary | ICD-10-CM

## 2011-01-06 MED ORDER — PEG-KCL-NACL-NASULF-NA ASC-C 100 G PO SOLR: 1.0000 | Freq: Once | ORAL | Status: AC

## 2011-01-06 NOTE — Patient Instructions (Signed)
Pt to pick up prep with in 5 days 

## 2011-01-08 ENCOUNTER — Ambulatory Visit: Payer: Self-pay | Admitting: Internal Medicine

## 2011-01-10 ENCOUNTER — Other Ambulatory Visit: Payer: Self-pay | Admitting: Internal Medicine

## 2011-01-19 ENCOUNTER — Encounter: Payer: Self-pay | Admitting: Internal Medicine

## 2011-01-20 ENCOUNTER — Encounter: Payer: Self-pay | Admitting: Internal Medicine

## 2011-01-20 ENCOUNTER — Ambulatory Visit (AMBULATORY_SURGERY_CENTER): Payer: Medicare Other | Admitting: Internal Medicine

## 2011-01-20 VITALS — BP 155/87 | HR 80 | Temp 98.5°F | Resp 20 | Ht 61.5 in | Wt 155.0 lb

## 2011-01-20 DIAGNOSIS — Z86010 Personal history of colonic polyps: Secondary | ICD-10-CM

## 2011-01-20 DIAGNOSIS — Z1211 Encounter for screening for malignant neoplasm of colon: Secondary | ICD-10-CM

## 2011-01-20 NOTE — Patient Instructions (Addendum)
We would like to see you again for another colonoscopy in 5 yrs. Per Dr. Marina Goodell.    You had a normal test today!  Resume your medicines, except the ones located in the stop taking box.    Call us if you have any questions at  [phone]. Thank you.

## 2011-01-21 ENCOUNTER — Telehealth: Payer: Self-pay | Admitting: *Deleted

## 2011-01-21 NOTE — Telephone Encounter (Signed)

## 2011-03-06 NOTE — Assessment & Plan Note (Signed)
[facility]                                 ON-CALL NOTE   NAME:RIVESToy, Samarin                          MRN:          [id number]  DATE:12/17/2007                            DOB:          1941-09-23    TIME OF CALL:  9:24 a.m.   PHONE NUMBER:  [phone].   OBJECTIVE:  The patient wants an appointment, was seen on Wednesday by  Dr. Amador Cunas for bronchitis.  Was given Doxycycline 100 mg b.i.d.,  Advair twice a day, and Proventil twice a day, and was told to take  Mucinex, which she thinks is Robitussin, every 4 hours.  She is not  breathing any better and is going out of town tomorrow.  Wants to know  if anything else she can do, or if she needs an appointment.  I would  advise that her treatment is about maxed out at this point.  The only  thing she is lacking is oral steroids, which I do not think I would want  to give her, going out of town.  Was told to give her antibiotics and  her inhalers more time to work, as they have only been for a couple of  days.   PRIMARY CARE Travonna Swindle:  Dr. Amador Cunas.  Home office is [place].  She was told to go to the ER if she had acute problems breathing.     Arta Silence, MD  Electronically Signed    RNS/MedQ  DD: 12/17/2007  DT: 12/18/2007  Job #: [id number]

## 2011-03-06 NOTE — Assessment & Plan Note (Signed)
[facility]                            [facility] OFFICE NOTE   NAME:[name], [name]                          MRN:          [id number]  DATE:09/30/2006                            DOB:          Jan 24, 1941    A 70 year old African-American female seen today for an annual exam.  She has a history of hypertension, colonic polyps, menopausal syndrome.  She does remarkably well.  There is a history of asthma and allergic  rhinitis which has been stable.  She has had a prior laminectomy,  hysterectomy, and cholecystectomy in the past.  Had a colonoscopy in  2006.   FAMILY HISTORY:  Father died young of an MI at 77.  Mother is in her  late 81s.  Maternal grandmother died at age 65.  All siblings are in  good health.   EXAMINATION:  GENERAL:  Revealed a healthy-appearing black female who  appeared younger than her stated age.  Blood pressure was well  controlled.  SKIN:  Negative.  HEENT:  Fundi, ear, nose and throat clear.  NECK:  No  bruits or adenopathy.  CHEST:  Clear.  BREASTS:  Negative.  CARDIOVASCULAR:  Normal heart sounds, no murmurs.  ABDOMEN:  Benign.  PELVIC:  Revealed absent uterus, no adnexal masses.  RECTAL:  Deferred.  EXTREMITIES:  Revealed full peripheral pulses, no edema.  NEUROLOGIC:  Negative.   IMPRESSION:  1. Hypertension.  2. Asthma.  3. Seasonal allergic rhinitis.  4. Colonic polyps.   DISPOSITION:  Medical regimen unchanged.  Reassess in 6 months.     Gordy Savers, MD  Electronically Signed    PFK/MedQ  DD: 09/30/2006  DT: 09/30/2006  Job #: [id number]

## 2011-03-25 ENCOUNTER — Ambulatory Visit (INDEPENDENT_AMBULATORY_CARE_PROVIDER_SITE_OTHER): Payer: Medicare Other | Admitting: Internal Medicine

## 2011-03-25 ENCOUNTER — Encounter: Payer: Self-pay | Admitting: Internal Medicine

## 2011-03-25 DIAGNOSIS — I10 Essential (primary) hypertension: Secondary | ICD-10-CM

## 2011-03-25 DIAGNOSIS — J45909 Unspecified asthma, uncomplicated: Secondary | ICD-10-CM

## 2011-03-25 DIAGNOSIS — J309 Allergic rhinitis, unspecified: Secondary | ICD-10-CM

## 2011-03-25 DIAGNOSIS — K219 Gastro-esophageal reflux disease without esophagitis: Secondary | ICD-10-CM

## 2011-03-25 MED ORDER — TRAMADOL HCL 50 MG PO TABS: 50.0000 mg | ORAL_TABLET | Freq: Four times a day (QID) | ORAL | Status: DC | PRN

## 2011-03-25 NOTE — Progress Notes (Signed)
  Subjective:    Patient ID: Kristy [name], female    DOB: Apr 22, 1941, 70 y.o.   MRN: [id number]  HPI  70 year old patient who is seen today for followup of her hypertension. She remains quite active in spite of caring for a disabled husband. Her main complaint is of left knee pain. She has had arthroscopic surgery and will require totally replacement therapy when able. She has a allergic rhinitis and asthma which has been stable. She had a fairly uneventful spring. She uses very little albuterol rescue medication. She is maintained on the Advair Diskus which controls her asthma quite well. She has gastro esophageal reflux disease which has been well-controlled.  She remains on a anti-reflux diet She has much situational stress caring for her husband who is now on chronic oxygen therapy for pulmonary hypertension  Review of Systems  Constitutional: Negative.   HENT: Negative for hearing loss, congestion, sore throat, rhinorrhea, dental problem, sinus pressure and tinnitus.   Eyes: Negative for pain, discharge and visual disturbance.  Respiratory: Negative for cough and shortness of breath.   Cardiovascular: Negative for chest pain, palpitations and leg swelling.  Gastrointestinal: Negative for nausea, vomiting, abdominal pain, diarrhea, constipation, blood in stool and abdominal distention.  Genitourinary: Negative for dysuria, urgency, frequency, hematuria, flank pain, vaginal bleeding, vaginal discharge, difficulty urinating, vaginal pain and pelvic pain.  Musculoskeletal: Negative for joint swelling, arthralgias and gait problem.  Skin: Negative for rash.  Neurological: Negative for dizziness, syncope, speech difficulty, weakness, numbness and headaches.  Hematological: Negative for adenopathy.  Psychiatric/Behavioral: Negative for behavioral problems, dysphoric mood and agitation. The patient is not nervous/anxious.        Objective:   Physical Exam  Constitutional: She is oriented to  person, place, and time. She appears well-developed and well-nourished.  HENT:  Head: Normocephalic.  Right Ear: External ear normal.  Left Ear: External ear normal.  Mouth/Throat: Oropharynx is clear and moist.  Eyes: Conjunctivae and EOM are normal. Pupils are equal, round, and reactive to light.  Neck: Normal range of motion. Neck supple. No thyromegaly present.  Cardiovascular: Normal rate, regular rhythm, normal heart sounds and intact distal pulses.   Pulmonary/Chest: Effort normal and breath sounds normal.  Abdominal: Soft. Bowel sounds are normal. She exhibits no mass. There is no tenderness.  Musculoskeletal: Normal range of motion.  Lymphadenopathy:    She has no cervical adenopathy.  Neurological: She is alert and oriented to person, place, and time.  Skin: Skin is warm and dry. No rash noted.  Psychiatric: She has a normal mood and affect. Her behavior is normal.          Assessment & Plan:   Hypertension. Well controlled; will continue diuretic therapy Asthma and allergic rhinitis. Stable we'll continue maintenance Advair discus Osteoarthritis with the left knee pain. Tramadol prescription refilled

## 2011-03-25 NOTE — Patient Instructions (Signed)
Avoids foods high in acid such as tomatoes citrus juices, and spicy foods.  Avoid eating within two hours of lying down or before exercising.  Do not overheat.  Try smaller more frequent meals.  If symptoms persist, elevate the head of her bed 12 inches while sleeping.  Please check your blood pressure on a regular basis.  If it is consistently greater than 150/90, please make an office appointment.  Take a calcium supplement, plus [phone] units of vitamin D  Limit your sodium (Salt) intake    It is important that you exercise regularly, at least 20 minutes 3 to 4 times per week.  If you develop chest pain or shortness of breath seek  medical attention.  Return in 6 months for follow-up

## 2011-06-29 ENCOUNTER — Emergency Department (INDEPENDENT_AMBULATORY_CARE_PROVIDER_SITE_OTHER): Payer: Medicare Other

## 2011-06-29 ENCOUNTER — Emergency Department (HOSPITAL_BASED_OUTPATIENT_CLINIC_OR_DEPARTMENT_OTHER)
Admission: EM | Admit: 2011-06-29 | Discharge: 2011-06-29 | Disposition: A | Discharge status: Home / Self Care | Payer: Medicare Other | Attending: Emergency Medicine | Admitting: Emergency Medicine

## 2011-06-29 ENCOUNTER — Encounter (HOSPITAL_BASED_OUTPATIENT_CLINIC_OR_DEPARTMENT_OTHER): Payer: Self-pay

## 2011-06-29 DIAGNOSIS — M25519 Pain in unspecified shoulder: Secondary | ICD-10-CM

## 2011-06-29 DIAGNOSIS — J45909 Unspecified asthma, uncomplicated: Secondary | ICD-10-CM | POA: Insufficient documentation

## 2011-06-29 DIAGNOSIS — I10 Essential (primary) hypertension: Secondary | ICD-10-CM | POA: Insufficient documentation

## 2011-06-29 MED ORDER — IBUPROFEN 400 MG PO TABS
ORAL_TABLET | ORAL | Status: AC
  Filled 2011-06-29: qty 1

## 2011-06-29 MED ORDER — IBUPROFEN 200 MG PO TABS
ORAL_TABLET | ORAL | Status: AC
  Filled 2011-06-29: qty 1

## 2011-06-29 MED ORDER — IBUPROFEN 200 MG PO TABS
600.0000 mg | ORAL_TABLET | Freq: Once | ORAL | Status: AC
  Administered 2011-06-29: 600 mg via ORAL

## 2011-06-29 MED ORDER — OXYCODONE-ACETAMINOPHEN 5-325 MG PO TABS: 1.0000 | ORAL_TABLET | ORAL | Status: AC | PRN

## 2011-06-29 NOTE — ED Notes (Signed)
Near fall today-caught slef with right shoulder-decreased movement to RUE

## 2011-06-29 NOTE — ED Provider Notes (Signed)
History     CSN: [id number] Arrival date & time: 06/29/2011  4:02 PM  Chief Complaint  Patient presents with  . Shoulder Pain   HPI  Past Medical History  Diagnosis Date  . ALLERGIC RHINITIS 04/07/2007  . ASTHMA 04/07/2007  . COLONIC POLYPS, HX OF 04/07/2007  . GERD 04/07/2007  . HYPERTENSION 04/07/2007  . UNSPECIFIED DISORDER TEETH&SUPPORTING STRUCTURES 09/09/2010    Past Surgical History  Procedure Date  . Cholecystectomy   . Abdominal hysterectomy   . Laminectomy     No family history on file.  History  Substance Use Topics  . Smoking status: Former Smoker    Quit date: 10/19/1988  . Smokeless tobacco: Not on file  . Alcohol Use: 0.0 oz/week    OB History    Grav Para Term Preterm Abortions TAB SAB Ect Mult Living                  Review of Systems  Physical Exam  BP 138/108  Pulse 78  Temp(Src) 98.2 F (36.8 C) (Oral)  Resp 18  Ht 5\' 2"  (1.575 m)  Wt 150 lb (68.04 kg)  BMI 27.44 kg/m2  SpO2 98%  Physical Exam  ED Course  Procedures  MDM Patient without s/s fracture or dislocation.  Plan sling and nsaid and pain medicine.  Patient will follow up with her orhtopedist asap.      Hilario Quarry, MD 06/29/11 [phone]

## 2011-06-29 NOTE — ED Provider Notes (Addendum)
History  Scribed for Dr. Rosalia Hammers, the patient was seen in room 7. The chart was scribed by Gilman Schmidt. The patients care was started at 1653.  CSN: [id number] Arrival date & time: 06/29/2011  4:02 PM  Chief Complaint  Patient presents with  . Shoulder Pain   HPI Kristy [name] is a 70 y.o. female with a history of HTN who presents to the Emergency Department complaining of right shoulder pain. States that pain has been present for ~1 week but believed it to be bursitis. Pt reports worsening of pain after a near fall today  where she caught herself with right shoulder. States that pain was an 8/10 when she came in but the pain is subsiding. Reports taking Tramadol for pain. There are no other associated symptoms and no other alleviating or aggravating factors.   HPI ELEMENTS:  Location: right shoulder  Onset: ~1 week ago Duration: consistent since onset  Modifying Factors: alleviated by Tramadol Timing: constant  Context: as above    PAST MEDICAL HISTORY:  Past Medical History  Diagnosis Date  . ALLERGIC RHINITIS 04/07/2007  . ASTHMA 04/07/2007  . COLONIC POLYPS, HX OF 04/07/2007  . GERD 04/07/2007  . HYPERTENSION 04/07/2007  . UNSPECIFIED DISORDER TEETH&SUPPORTING STRUCTURES 09/09/2010     PAST SURGICAL HISTORY:  Past Surgical History  Procedure Date  . Cholecystectomy   . Abdominal hysterectomy   . Laminectomy      MEDICATIONS:  Previous Medications   ALBUTEROL (PROVENTIL HFA;VENTOLIN HFA) 108 (90 BASE) MCG/ACT INHALER    Inhale 2 puffs into the lungs every 6 (six) hours as needed.     ASPIRIN EC 81 MG TABLET    Take 81 mg by mouth daily.     CELECOXIB (CELEBREX) 200 MG CAPSULE    Take 200 mg by mouth daily.     FEXOFENADINE-PSEUDOEPHEDRINE (ALLEGRA-D) 60-120 MG PER TABLET    Take 1 tablet by mouth daily as needed. allergies   FLUTICASONE-SALMETEROL (ADVAIR DISKUS) 250-50 MCG/DOSE AEPB    Inhale 1 puff into the lungs every 12 (twelve) hours as needed. Shortness of breath and  wheezing   HYDROCHLOROTHIAZIDE 25 MG TABLET    TAKE 1 TABLET BY MOUTH EVERY DAY   PREDNISONE, PAK, (STERAPRED) 5 MG TABS       TRAMADOL (ULTRAM) 50 MG TABLET    Take 1 tablet (50 mg total) by mouth every 6 (six) hours as needed.   TRAMADOL-ACETAMINOPHEN (ULTRACET) 37.5-325 MG PER TABLET       TROLAMINE SALICYLATE (ASPERCREME) 10 % CREAM    Apply topically as needed. pain      ALLERGIES:  Allergies as of 06/29/2011 - Review Complete 06/29/2011  Allergen Reaction Noted  . Aspirin Itching 04/07/2007  . Clonidine Hives 04/07/2007     FAMILY HISTORY:  No family history on file.   SOCIAL HISTORY: History  Substance Use Topics  . Smoking status: Former Smoker    Quit date: 10/19/1988  . Smokeless tobacco: Not on file  . Alcohol Use: 0.0 oz/week      Review of Systems  Musculoskeletal:       RUE pain  All other systems reviewed and are negative.    Physical Exam  BP 138/108  Pulse 78  Temp(Src) 98.2 F (36.8 C) (Oral)  Resp 18  Ht 5\' 2"  (1.575 m)  Wt 150 lb (68.04 kg)  BMI 27.44 kg/m2  SpO2 98%  Physical Exam  Nursing note and vitals reviewed. Constitutional: She is oriented to person, place, and  time. She appears well-developed and well-nourished.  HENT:  Head: Normocephalic and atraumatic.  Eyes: Conjunctivae and EOM are normal. Pupils are equal, round, and reactive to light.  Neck: Normal range of motion. Neck supple.  Cardiovascular: Normal rate and regular rhythm.   Pulmonary/Chest: Effort normal and breath sounds normal.  Abdominal: Soft. Bowel sounds are normal.  Musculoskeletal:       Tenderness to trapezius muscle   Neurological: She is alert and oriented to person, place, and time.  Skin: Skin is warm and dry.  Psychiatric: She has a normal mood and affect.   OTHER DATA REVIEWED: Nursing notes, vital signs, and past medical records reviewed.  DIAGNOSTIC STUDIES: Oxygen Saturation is  98% on room air, normal by my interpretation.    RADIOLOGY:  DG  Shoulder Right IMPRESSION: No acute bony findings. Original Report Authenticated By: P. Loralie Champagne, M.D.    MDM: No fracture of dislocation seen  IMPRESSION: Diagnoses that have been ruled out:  Diagnoses that are still under consideration:  Final diagnoses:    PLAN:  Home The patient is to return the emergency department if there is any worsening of symptoms. I have reviewed the discharge instructions with the patient and family.   CONDITION ON DISCHARGE: Stable.   MEDICATIONS GIVEN IN THE E.D.  Medications  aspirin EC 81 MG tablet (not administered)  trolamine salicylate (ASPERCREME) 10 % cream (not administered)  benazepril (LOTENSIN) 40 MG tablet (not administered)    DISCHARGE MEDICATIONS: New Prescriptions   No medications on file    SCRIBE ATTESTATION:   Procedures  . I personally performed the services described in this documentation, which was scribed in my presence. The recorded information has been reviewed and considered. Hilario Quarry, MD     Hilario Quarry, MD 06/29/11 1712  Hilario Quarry, MD 07/01/11 [phone]

## 2011-09-23 ENCOUNTER — Encounter: Payer: Self-pay | Admitting: Internal Medicine

## 2011-09-23 ENCOUNTER — Ambulatory Visit (INDEPENDENT_AMBULATORY_CARE_PROVIDER_SITE_OTHER): Payer: Medicare Other | Admitting: Internal Medicine

## 2011-09-23 VITALS — BP 108/80 | HR 80 | Temp 97.5°F | Wt 161.0 lb

## 2011-09-23 DIAGNOSIS — Z0000 Encounter for general adult medical examination without abnormal findings: Secondary | ICD-10-CM

## 2011-09-23 DIAGNOSIS — Z23 Encounter for immunization: Secondary | ICD-10-CM

## 2011-09-23 DIAGNOSIS — J309 Allergic rhinitis, unspecified: Secondary | ICD-10-CM

## 2011-09-23 DIAGNOSIS — I10 Essential (primary) hypertension: Secondary | ICD-10-CM

## 2011-09-23 DIAGNOSIS — J45909 Unspecified asthma, uncomplicated: Secondary | ICD-10-CM

## 2011-09-23 MED ORDER — BENAZEPRIL HCL 40 MG PO TABS: 40.0000 mg | ORAL_TABLET | Freq: Every day | ORAL | Status: DC

## 2011-09-23 MED ORDER — CELECOXIB 200 MG PO CAPS: 200.0000 mg | ORAL_CAPSULE | Freq: Every day | ORAL | Status: DC

## 2011-09-23 MED ORDER — TRAMADOL HCL 50 MG PO TABS: 50.0000 mg | ORAL_TABLET | Freq: Four times a day (QID) | ORAL | Status: DC | PRN

## 2011-09-23 MED ORDER — HYDROCHLOROTHIAZIDE 25 MG PO TABS: 25.0000 mg | ORAL_TABLET | Freq: Every day | ORAL | Status: DC

## 2011-09-23 NOTE — Progress Notes (Signed)
  Subjective:    Patient ID: Kristy [name], female    DOB: 1941-03-08, 70 y.o.   MRN: [id number]  HPI  70 year old patient who is in today for her six-month followup she has a history of asthma which has been quite stable. She is on no maintenance medications at this time. She has not required any albuterol rescue medication She has treated hypertension and blood pressure today is in the low-normal range. She remains on combination therapy. She has some osteoarthritis and benefits well from Celebrex and when necessary tramadol. She is quite active with dancing 3 times per week and bowling 2 times weekly. Kristy [name] quite well without concerns or complaints.    Review of Systems  Constitutional: Negative.   HENT: Negative for hearing loss, congestion, sore throat, rhinorrhea, dental problem, sinus pressure and tinnitus.   Eyes: Negative for pain, discharge and visual disturbance.  Respiratory: Negative for cough and shortness of breath.   Cardiovascular: Negative for chest pain, palpitations and leg swelling.  Gastrointestinal: Negative for nausea, vomiting, abdominal pain, diarrhea, constipation, blood in stool and abdominal distention.  Genitourinary: Negative for dysuria, urgency, frequency, hematuria, flank pain, vaginal bleeding, vaginal discharge, difficulty urinating, vaginal pain and pelvic pain.  Musculoskeletal: Positive for back pain and arthralgias. Negative for joint swelling and gait problem.  Skin: Negative for rash.  Neurological: Negative for dizziness, syncope, speech difficulty, weakness, numbness and headaches.  Hematological: Negative for adenopathy.  Psychiatric/Behavioral: Negative for behavioral problems, dysphoric mood and agitation. The patient is not nervous/anxious.        Objective:   Physical Exam  Constitutional: She is oriented to person, place, and time. She appears well-developed and well-nourished.  HENT:  Head: Normocephalic.  Right Ear: External ear  normal.  Left Ear: External ear normal.  Mouth/Throat: Oropharynx is clear and moist.  Eyes: Conjunctivae and EOM are normal. Pupils are equal, round, and reactive to light.  Neck: Normal range of motion. Neck supple. No thyromegaly present.  Cardiovascular: Normal rate, regular rhythm, normal heart sounds and intact distal pulses.   Pulmonary/Chest: Effort normal and breath sounds normal.  Abdominal: Soft. Bowel sounds are normal. She exhibits no mass. There is no tenderness.  Musculoskeletal: Normal range of motion.  Lymphadenopathy:    She has no cervical adenopathy.  Neurological: She is alert and oriented to person, place, and time.  Skin: Skin is warm and dry. No rash noted.  Psychiatric: She has a normal mood and affect. Her behavior is normal.          Assessment & Plan:   Hypertension. Well controlled we'll continue combination therapy Asthma stable we'll continue rescue albuterol samples refilled Osteoarthritis. Fairly mild and stable. Medications refilled  Low-salt diet regular exercise encouraged. We'll see in 6 months.

## 2011-09-23 NOTE — Patient Instructions (Signed)
Limit your sodium (Salt) intake    It is important that you exercise regularly, at least 20 minutes 3 to 4 times per week.  If you develop chest pain or shortness of breath seek  medical attention.  Return in 6 months for follow-up  Please check your blood pressure on a regular basis.  If it is consistently greater than 150/90, please make an office appointment.   

## 2011-10-21 ENCOUNTER — Other Ambulatory Visit: Payer: Self-pay | Admitting: Internal Medicine

## 2012-03-05 DIAGNOSIS — Z803 Family history of malignant neoplasm of breast: Secondary | ICD-10-CM | POA: Diagnosis not present

## 2012-03-05 DIAGNOSIS — Z1231 Encounter for screening mammogram for malignant neoplasm of breast: Secondary | ICD-10-CM | POA: Diagnosis not present

## 2012-03-08 ENCOUNTER — Encounter: Payer: Self-pay | Admitting: Internal Medicine

## 2012-03-17 ENCOUNTER — Encounter: Payer: Self-pay | Admitting: Internal Medicine

## 2012-03-23 ENCOUNTER — Encounter: Payer: Self-pay | Admitting: Internal Medicine

## 2012-03-23 ENCOUNTER — Ambulatory Visit (INDEPENDENT_AMBULATORY_CARE_PROVIDER_SITE_OTHER): Payer: Medicare Other | Admitting: Internal Medicine

## 2012-03-23 ENCOUNTER — Telehealth: Payer: Self-pay

## 2012-03-23 VITALS — BP 130/88 | HR 72 | Temp 97.6°F | Resp 18 | Ht 60.5 in | Wt 156.0 lb

## 2012-03-23 DIAGNOSIS — Z0000 Encounter for general adult medical examination without abnormal findings: Secondary | ICD-10-CM

## 2012-03-23 DIAGNOSIS — J45909 Unspecified asthma, uncomplicated: Secondary | ICD-10-CM

## 2012-03-23 DIAGNOSIS — E785 Hyperlipidemia, unspecified: Secondary | ICD-10-CM | POA: Diagnosis not present

## 2012-03-23 DIAGNOSIS — I10 Essential (primary) hypertension: Secondary | ICD-10-CM | POA: Diagnosis not present

## 2012-03-23 DIAGNOSIS — K219 Gastro-esophageal reflux disease without esophagitis: Secondary | ICD-10-CM

## 2012-03-23 DIAGNOSIS — Z860100 Personal history of colon polyps, unspecified: Secondary | ICD-10-CM

## 2012-03-23 DIAGNOSIS — J309 Allergic rhinitis, unspecified: Secondary | ICD-10-CM

## 2012-03-23 LAB — CBC WITH DIFFERENTIAL/PLATELET
Basophils Relative: 0.5 % (ref 0.0–3.0)
Eosinophils Relative: 3.5 % (ref 0.0–5.0)
HCT: 39.9 % (ref 36.0–46.0)
Hemoglobin: 13 g/dL (ref 12.0–15.0)
Lymphs Abs: 2.6 10*3/uL (ref 0.7–4.0)
MCV: 91.4 fl (ref 78.0–100.0)
Monocytes Absolute: 0.3 10*3/uL (ref 0.1–1.0)
Neutro Abs: 3.3 10*3/uL (ref 1.4–7.7)
Platelets: 289 10*3/uL (ref 150.0–400.0)
RBC: 4.37 Mil/uL (ref 3.87–5.11)
WBC: 6.5 10*3/uL (ref 4.5–10.5)

## 2012-03-23 LAB — COMPREHENSIVE METABOLIC PANEL
Alkaline Phosphatase: 51 U/L (ref 39–117)
Glucose, Bld: 83 mg/dL (ref 70–99)
Sodium: 143 mEq/L (ref 135–145)
Total Bilirubin: 0.6 mg/dL (ref 0.3–1.2)
Total Protein: 7.3 g/dL (ref 6.0–8.3)

## 2012-03-23 LAB — TSH: TSH: 1.3 u[IU]/mL (ref 0.35–5.50)

## 2012-03-23 LAB — LIPID PANEL
HDL: 67.8 mg/dL (ref >39.00)
VLDL: 17.8 mg/dL (ref 0.0–40.0)

## 2012-03-23 MED ORDER — BENAZEPRIL HCL 40 MG PO TABS: 40.0000 mg | ORAL_TABLET | Freq: Every day | ORAL | Status: DC

## 2012-03-23 MED ORDER — HYDROCHLOROTHIAZIDE 25 MG PO TABS: 25.0000 mg | ORAL_TABLET | Freq: Every day | ORAL | Status: DC

## 2012-03-23 MED ORDER — TRAMADOL HCL 50 MG PO TABS: 50.0000 mg | ORAL_TABLET | Freq: Four times a day (QID) | ORAL | Status: DC | PRN

## 2012-03-23 MED ORDER — CELECOXIB 200 MG PO CAPS: 200.0000 mg | ORAL_CAPSULE | Freq: Every day | ORAL | Status: DC

## 2012-03-23 NOTE — Telephone Encounter (Signed)
Opened in error

## 2012-03-23 NOTE — Patient Instructions (Signed)
Limit your sodium (Salt) intake    It is important that you exercise regularly, at least 20 minutes 3 to 4 times per week.  If you develop chest pain or shortness of breath seek  medical attention.  Return in 6 months for follow-up  

## 2012-03-23 NOTE — Progress Notes (Signed)
Subjective:    Patient ID: Kristy [name], female    DOB: Aug 25, 1941, 71 y.o.   MRN: [id number]  HPI  71 year old patient who is seen today for a preventive health examination. She does quite well for history of hypertension. She does have a history of allergic rhinitis and asthma that didn't feel well this past print she has gastroesophageal reflux disease. Her most pressing problem is osteoarthritis affecting primarily the small joints of the hands. She uses Celebrex and also tramadol as needed. She is maintained nice blood pressure control.  She had a colonoscopy last year She had a mammogram performed last month  Past Medical History  Diagnosis Date  . ALLERGIC RHINITIS 04/07/2007  . ASTHMA 04/07/2007  . COLONIC POLYPS, HX OF 04/07/2007  . GERD 04/07/2007  . HYPERTENSION 04/07/2007  . UNSPECIFIED DISORDER TEETH&SUPPORTING STRUCTURES 09/09/2010    History   Social History  . Marital Status: Married    Spouse Name: N/A    Number of Children: N/A  . Years of Education: N/A   Occupational History  . Not on file.   Social History Main Topics  . Smoking status: Former Smoker    Quit date: 10/19/1988  . Smokeless tobacco: Not on file  . Alcohol Use: 0.0 oz/week  . Drug Use: Not on file  . Sexually Active: Not on file   Other Topics Concern  . Not on file   Social History Narrative  . No narrative on file    Past Surgical History  Procedure Date  . Cholecystectomy   . Abdominal hysterectomy   . Laminectomy     No family history on file.  Allergies  Allergen Reactions  . Aspirin Itching  . Clonidine Hives    Current Outpatient Prescriptions on File Prior to Visit  Medication Sig Dispense Refill  . aspirin EC 81 MG tablet Take 81 mg by mouth daily.        Marland Kitchen trolamine salicylate (ASPERCREME) 10 % cream Apply topically as needed. pain       . DISCONTD: benazepril (LOTENSIN) 40 MG tablet TAKE 1 TABLET BY MOUTH EVERY DAY  90 tablet  3  . DISCONTD: hydrochlorothiazide  (HYDRODIURIL) 25 MG tablet TAKE 1 TABLET BY MOUTH EVERY DAY  90 tablet  3  . DISCONTD: benazepril (LOTENSIN) 40 MG tablet Take 1 tablet (40 mg total) by mouth daily.  90 tablet  6  . DISCONTD: hydrochlorothiazide (HYDRODIURIL) 25 MG tablet Take 1 tablet (25 mg total) by mouth daily.  90 tablet  6    BP 130/88  Pulse 72  Temp(Src) 97.6 F (36.4 C) (Oral)  Resp 18  Ht 5' 0.5" (1.537 m)  Wt 156 lb (70.761 kg)  BMI 29.97 kg/m2  SpO2 95%  1. Risk factors, based on past  M,S,F history-   cardiovascular risk factors include a history of hypertension  2.  Physical activities: Remains quite active walks daily and bowls frequently does care for her disabled husband who has pulmonary hypertension and is on chronic oxygen therapy  3.  Depression/mood: No history depression or mood disorder  4.  Hearing: No deficits  5.  ADL's: Independent in all aspects of daily living  6.  Fall risk: Low  7.  Home safety: No problems identified  8.  Height weight, and visual acuity; height and weight stable no change in visual acuity  9.  Counseling: Heart healthy diet regular exercise all encouraged  10. Lab orders based on risk factors: Laboratory profile including lipid  panel will be reviewed  11. Referral : Not appropriate at this time  12. Care plan: Heart healthy diet regular exercise encouraged. Home blood pressure monitoring encouraged 13. Cognitive assessment: Alert and appropriate with normal affect no cognitive dysfunction        Review of Systems  Constitutional: Negative for fever, appetite change, fatigue and unexpected weight change.  HENT: Negative for hearing loss, ear pain, nosebleeds, congestion, sore throat, mouth sores, trouble swallowing, neck stiffness, dental problem, voice change, sinus pressure and tinnitus.   Eyes: Negative for photophobia, pain, redness and visual disturbance.  Respiratory: Negative for cough, chest tightness and shortness of breath.     Cardiovascular: Negative for chest pain, palpitations and leg swelling.  Gastrointestinal: Negative for nausea, vomiting, abdominal pain, diarrhea, constipation, blood in stool, abdominal distention and rectal pain.  Genitourinary: Negative for dysuria, urgency, frequency, hematuria, flank pain, vaginal bleeding, vaginal discharge, difficulty urinating, genital sores, vaginal pain, menstrual problem and pelvic pain.  Musculoskeletal: Positive for joint swelling and arthralgias. Negative for back pain.  Skin: Negative for rash.  Neurological: Negative for dizziness, syncope, speech difficulty, weakness, light-headedness, numbness and headaches.  Hematological: Negative for adenopathy. Does not bruise/bleed easily.  Psychiatric/Behavioral: Negative for suicidal ideas, behavioral problems, self-injury, dysphoric mood and agitation. The patient is not nervous/anxious.        Objective:   Physical Exam  Constitutional: She is oriented to person, place, and time. She appears well-developed and well-nourished.       Weight 156. Blood pressure 140/82 (the medications today)  HENT:  Head: Normocephalic and atraumatic.  Right Ear: External ear normal.  Left Ear: External ear normal.  Mouth/Throat: Oropharynx is clear and moist.  Eyes: Conjunctivae and EOM are normal.  Neck: Normal range of motion. Neck supple. No JVD present. No thyromegaly present.  Cardiovascular: Normal rate, regular rhythm, normal heart sounds and intact distal pulses.   No murmur heard. Pulmonary/Chest: Effort normal and breath sounds normal. She has no wheezes. She has no rales.  Abdominal: Soft. Bowel sounds are normal. She exhibits no distension and no mass. There is no tenderness. There is no rebound and no guarding.       Right upper quadrant scar  Genitourinary: Vagina normal.  Musculoskeletal: Normal range of motion. She exhibits no edema and no tenderness.  Neurological: She is alert and oriented to person, place,  and time. She has normal reflexes. No cranial nerve deficit. She exhibits normal muscle tone. Coordination normal.  Skin: Skin is warm and dry. No rash noted.  Psychiatric: She has a normal mood and affect. Her behavior is normal.          Assessment & Plan:   Preventive health examination Hypertension stable we'll continue present regimen we'll continue low-salt diet and exercise program modest weight loss encouraged Asthma/allergic rhinitis stable Osteoarthritis  Recheck 6 months

## 2012-06-01 DIAGNOSIS — M2550 Pain in unspecified joint: Secondary | ICD-10-CM | POA: Diagnosis not present

## 2012-06-01 DIAGNOSIS — M1990 Unspecified osteoarthritis, unspecified site: Secondary | ICD-10-CM | POA: Diagnosis not present

## 2012-06-01 DIAGNOSIS — M545 Low back pain: Secondary | ICD-10-CM | POA: Diagnosis not present

## 2012-07-08 DIAGNOSIS — M545 Low back pain: Secondary | ICD-10-CM | POA: Diagnosis not present

## 2012-07-08 DIAGNOSIS — Z79899 Other long term (current) drug therapy: Secondary | ICD-10-CM | POA: Diagnosis not present

## 2012-07-08 DIAGNOSIS — M1990 Unspecified osteoarthritis, unspecified site: Secondary | ICD-10-CM | POA: Diagnosis not present

## 2012-07-08 DIAGNOSIS — M159 Polyosteoarthritis, unspecified: Secondary | ICD-10-CM | POA: Diagnosis not present

## 2012-09-22 ENCOUNTER — Ambulatory Visit (INDEPENDENT_AMBULATORY_CARE_PROVIDER_SITE_OTHER): Payer: Medicare Other | Admitting: Internal Medicine

## 2012-09-22 ENCOUNTER — Encounter: Payer: Self-pay | Admitting: Internal Medicine

## 2012-09-22 VITALS — BP 138/88 | HR 76 | Temp 97.8°F | Resp 18 | Wt 159.0 lb

## 2012-09-22 DIAGNOSIS — I10 Essential (primary) hypertension: Secondary | ICD-10-CM | POA: Diagnosis not present

## 2012-09-22 DIAGNOSIS — M1710 Unilateral primary osteoarthritis, unspecified knee: Secondary | ICD-10-CM | POA: Insufficient documentation

## 2012-09-22 DIAGNOSIS — M1990 Unspecified osteoarthritis, unspecified site: Secondary | ICD-10-CM

## 2012-09-22 MED ORDER — BENAZEPRIL HCL 40 MG PO TABS: 40.0000 mg | ORAL_TABLET | Freq: Every day | ORAL | Status: DC

## 2012-09-22 MED ORDER — CELECOXIB 200 MG PO CAPS: 200.0000 mg | ORAL_CAPSULE | Freq: Every day | ORAL | Status: DC

## 2012-09-22 MED ORDER — HYDROCHLOROTHIAZIDE 25 MG PO TABS: 25.0000 mg | ORAL_TABLET | Freq: Every day | ORAL | Status: DC

## 2012-09-22 MED ORDER — TRAMADOL HCL 50 MG PO TABS: 50.0000 mg | ORAL_TABLET | Freq: Four times a day (QID) | ORAL | Status: DC | PRN

## 2012-09-22 NOTE — Patient Instructions (Signed)
Limit your sodium (Salt) intake    It is important that you exercise regularly, at least 20 minutes 3 to 4 times per week.  If you develop chest pain or shortness of breath seek  medical attention.  Please check your blood pressure on a regular basis.  If it is consistently greater than 150/90, please make an office appointment.  You need to lose weight.  Consider a lower calorie diet and regular exercise. 

## 2012-09-22 NOTE — Progress Notes (Signed)
Subjective:    Patient ID: Kristy [name], female    DOB: 01-31-1941, 71 y.o.   MRN: [id number]  HPI  71 year old patient who is seen today for followup of hypertension. She has a history of osteoarthritis and has been self-referred to her mother's rheumatologist. She continues to have pain most marked in the small joints of the hands. Otherwise done quite well she has considerable situational stress due to the poor health of her husband an elderly mother. Otherwise done quite well. She has a history of asthma and allergic rhinitis which has been stable. No new concerns or complaints. She does need medications refilled  Past Medical History  Diagnosis Date  . ALLERGIC RHINITIS 04/07/2007  . ASTHMA 04/07/2007  . COLONIC POLYPS, HX OF 04/07/2007  . GERD 04/07/2007  . HYPERTENSION 04/07/2007  . UNSPECIFIED DISORDER TEETH&SUPPORTING STRUCTURES 09/09/2010    History   Social History  . Marital Status: Married    Spouse Name: N/A    Number of Children: N/A  . Years of Education: N/A   Occupational History  . Not on file.   Social History Main Topics  . Smoking status: Former Smoker    Quit date: 10/19/1988  . Smokeless tobacco: Not on file  . Alcohol Use: 0.0 oz/week  . Drug Use: Not on file  . Sexually Active: Not on file   Other Topics Concern  . Not on file   Social History Narrative  . No narrative on file    Past Surgical History  Procedure Date  . Cholecystectomy   . Abdominal hysterectomy   . Laminectomy     No family history on file.  Allergies  Allergen Reactions  . Aspirin Itching  . Clonidine Hives    Current Outpatient Prescriptions on File Prior to Visit  Medication Sig Dispense Refill  . aspirin EC 81 MG tablet Take 81 mg by mouth daily.        . benazepril (LOTENSIN) 40 MG tablet Take 1 tablet (40 mg total) by mouth daily.  90 tablet  3  . celecoxib (CELEBREX) 200 MG capsule Take 1 capsule (200 mg total) by mouth daily.  90 capsule  6  .  hydrochlorothiazide (HYDRODIURIL) 25 MG tablet Take 1 tablet (25 mg total) by mouth daily.  90 tablet  3  . traMADol (ULTRAM) 50 MG tablet Take 1 tablet (50 mg total) by mouth every 6 (six) hours as needed.  90 tablet  4  . trolamine salicylate (ASPERCREME) 10 % cream Apply topically as needed. pain         BP 138/88  Pulse 76  Temp 97.8 F (36.6 C) (Oral)  Resp 18  Wt 159 lb (72.122 kg)       Review of Systems  Constitutional: Negative.   HENT: Negative for hearing loss, congestion, sore throat, rhinorrhea, dental problem, sinus pressure and tinnitus.   Eyes: Negative for pain, discharge and visual disturbance.  Respiratory: Negative for cough and shortness of breath.   Cardiovascular: Negative for chest pain, palpitations and leg swelling.  Gastrointestinal: Negative for nausea, vomiting, abdominal pain, diarrhea, constipation, blood in stool and abdominal distention.  Genitourinary: Negative for dysuria, urgency, frequency, hematuria, flank pain, vaginal bleeding, vaginal discharge, difficulty urinating, vaginal pain and pelvic pain.  Musculoskeletal: Negative for joint swelling, arthralgias and gait problem.  Skin: Negative for rash.  Neurological: Negative for dizziness, syncope, speech difficulty, weakness, numbness and headaches.  Hematological: Negative for adenopathy.  Psychiatric/Behavioral: Negative for behavioral problems, dysphoric mood and  agitation. The patient is not nervous/anxious.        Objective:   Physical Exam  Constitutional: She is oriented to person, place, and time. She appears well-developed and well-nourished.  HENT:  Head: Normocephalic.  Right Ear: External ear normal.  Left Ear: External ear normal.  Mouth/Throat: Oropharynx is clear and moist.  Eyes: Conjunctivae normal and EOM are normal. Pupils are equal, round, and reactive to light.  Neck: Normal range of motion. Neck supple. No thyromegaly present.  Cardiovascular: Normal rate, regular  rhythm, normal heart sounds and intact distal pulses.   Pulmonary/Chest: Effort normal and breath sounds normal.  Abdominal: Soft. Bowel sounds are normal. She exhibits no mass. There is no tenderness.  Musculoskeletal: Normal range of motion.       Striking osteoarthritic changes of the small joints of the hands  Lymphadenopathy:    She has no cervical adenopathy.  Neurological: She is alert and oriented to person, place, and time.  Skin: Skin is warm and dry. No rash noted.  Psychiatric: She has a normal mood and affect. Her behavior is normal.          Assessment & Plan:   Hypertension well controlled. Repeat blood pressure 130/80 Osteoarthritis History of asthma/allergic rhinitis stable  All medications refilled Schedule CPX

## 2012-10-19 HISTORY — PX: KNEE ARTHROSCOPY: SUR90

## 2012-10-23 ENCOUNTER — Other Ambulatory Visit: Payer: Self-pay | Admitting: Internal Medicine

## 2012-12-29 ENCOUNTER — Telehealth: Payer: Self-pay | Admitting: Internal Medicine

## 2012-12-29 NOTE — Telephone Encounter (Signed)
Patient Information:  Caller Name: Adanya  Phone: [phone]  Patient: Kristy [name]  Gender: Female  DOB: [birth year]  Age: 72 Years  PCP: Eleonore Chiquito [facility])  Office Follow Up:  Does the office need to follow up with this patient?: Yes  Instructions For The Office: triaged to go to office now  she is about 20 min from the office  RN Note:  onset Monday of scratchy throat and then developed into a lot of chest congestion, she has productive cough of yellow sputum  no fever.  she said she does have wheezing if she doesn't use inhaler.   She said chest feels tight like it does when she has bronchitis  Symptoms  Reason For Call & Symptoms: sorethroat, cough and chest congestion  Reviewed Health History In EMR: Yes  Reviewed Medications In EMR: Yes  Reviewed Allergies In EMR: Yes  Reviewed Surgeries / Procedures: Yes  Date of Onset of Symptoms: 12/26/2012  Guideline(s) Used:  Colds  Cough  Disposition Per Guideline:   Go to Office Now  Reason For Disposition Reached:   Wheezing is present  Advice Given:  N/A  RN Overrode Recommendation:  Follow Up With Office Later  have to check with office for workin

## 2012-12-29 NOTE — Telephone Encounter (Signed)
appt set/kjh 

## 2012-12-30 ENCOUNTER — Encounter: Payer: Self-pay | Admitting: Internal Medicine

## 2012-12-30 ENCOUNTER — Ambulatory Visit (INDEPENDENT_AMBULATORY_CARE_PROVIDER_SITE_OTHER): Payer: Medicare Other | Admitting: Internal Medicine

## 2012-12-30 VITALS — BP 138/90 | HR 83 | Temp 97.6°F | Resp 20 | Wt 165.0 lb

## 2012-12-30 DIAGNOSIS — I10 Essential (primary) hypertension: Secondary | ICD-10-CM | POA: Diagnosis not present

## 2012-12-30 DIAGNOSIS — J309 Allergic rhinitis, unspecified: Secondary | ICD-10-CM

## 2012-12-30 DIAGNOSIS — J45909 Unspecified asthma, uncomplicated: Secondary | ICD-10-CM | POA: Diagnosis not present

## 2012-12-30 MED ORDER — PREDNISONE 20 MG PO TABS: 20.0000 mg | ORAL_TABLET | Freq: Every day | ORAL | Status: DC

## 2012-12-30 NOTE — Progress Notes (Signed)
Subjective:    Patient ID: Kristy [name], female    DOB: [birth year], 72 y.o.   MRN: [id number]  HPI  72 year old patient who has a history of asthma. She has been ill for 5 days with initial sore throat but presently has had head and chest congestion and cough. Cough is largely nonproductive but she has noted increasing wheezing. She is on maintenance Advair but no albuterol. No fever.  Past Medical History  Diagnosis Date  . ALLERGIC RHINITIS 04/07/2007  . ASTHMA 04/07/2007  . COLONIC POLYPS, HX OF 04/07/2007  . GERD 04/07/2007  . HYPERTENSION 04/07/2007  . UNSPECIFIED DISORDER TEETH&SUPPORTING STRUCTURES 09/09/2010    History   Social History  . Marital Status: Married    Spouse Name: N/A    Number of Children: N/A  . Years of Education: N/A   Occupational History  . Not on file.   Social History Main Topics  . Smoking status: Former Smoker    Quit date: 10/19/1988  . Smokeless tobacco: Not on file  . Alcohol Use: 0.0 oz/week  . Drug Use: Not on file  . Sexually Active: Not on file   Other Topics Concern  . Not on file   Social History Narrative  . No narrative on file    Past Surgical History  Procedure Laterality Date  . Cholecystectomy    . Abdominal hysterectomy    . Laminectomy      No family history on file.  Allergies  Allergen Reactions  . Aspirin Itching  . Clonidine Hives    Current Outpatient Prescriptions on File Prior to Visit  Medication Sig Dispense Refill  . aspirin EC 81 MG tablet Take 81 mg by mouth daily.        . benazepril (LOTENSIN) 40 MG tablet TAKE 1 TABLET BY MOUTH EVERY DAY  90 tablet  3  . celecoxib (CELEBREX) 200 MG capsule Take 1 capsule (200 mg total) by mouth daily.  90 capsule  6  . hydrochlorothiazide (HYDRODIURIL) 25 MG tablet TAKE 1 TABLET BY MOUTH EVERY DAY  90 tablet  3  . traMADol (ULTRAM) 50 MG tablet Take 1 tablet (50 mg total) by mouth every 6 (six) hours as needed.  90 tablet  4  . trolamine salicylate (ASPERCREME)  10 % cream Apply topically as needed. pain        No current facility-administered medications on file prior to visit.    BP 138/90  Pulse 83  Temp(Src) 97.6 F (36.4 C) (Oral)  Resp 20  Wt 165 lb (74.844 kg)  BMI 31.68 kg/m2  SpO2 95%       Review of Systems  Constitutional: Negative.   HENT: Positive for congestion and rhinorrhea. Negative for hearing loss, sore throat, dental problem, sinus pressure and tinnitus.   Eyes: Negative for pain, discharge and visual disturbance.  Respiratory: Positive for cough and wheezing. Negative for shortness of breath.   Cardiovascular: Negative for chest pain, palpitations and leg swelling.  Gastrointestinal: Negative for nausea, vomiting, abdominal pain, diarrhea, constipation, blood in stool and abdominal distention.  Genitourinary: Negative for dysuria, urgency, frequency, hematuria, flank pain, vaginal bleeding, vaginal discharge, difficulty urinating, vaginal pain and pelvic pain.  Musculoskeletal: Negative for joint swelling, arthralgias and gait problem.  Skin: Negative for rash.  Neurological: Negative for dizziness, syncope, speech difficulty, weakness, numbness and headaches.  Hematological: Negative for adenopathy.  Psychiatric/Behavioral: Negative for behavioral problems, dysphoric mood and agitation. The patient is not nervous/anxious.  Objective:   Physical Exam  Constitutional: She is oriented to person, place, and time. She appears well-developed and well-nourished. No distress.  HENT:  Head: Normocephalic.  Right Ear: External ear normal.  Left Ear: External ear normal.  Mouth/Throat: Oropharynx is clear and moist.  Eyes: Conjunctivae and EOM are normal. Pupils are equal, round, and reactive to light.  Neck: Normal range of motion. Neck supple. No thyromegaly present.  Cardiovascular: Normal rate, regular rhythm, normal heart sounds and intact distal pulses.   Pulmonary/Chest: Effort normal. She has no wheezes.   Scattered coarse rhonchi  Abdominal: Soft. Bowel sounds are normal. She exhibits no mass. There is no tenderness.  Musculoskeletal: Normal range of motion.  Lymphadenopathy:    She has no cervical adenopathy.  Neurological: She is alert and oriented to person, place, and time.  Skin: Skin is warm and dry. No rash noted.  Psychiatric: She has a normal mood and affect. Her behavior is normal.          Assessment & Plan:   Viral URI with cough and asthma exacerbation. We'll add albuterol continue Advair expectorants. We'll treat with the brief course of oral prednisone Hypertension stable

## 2012-12-30 NOTE — Patient Instructions (Signed)
Use saline irrigation, warm  moist compresses and over-the-counter decongestants only as directed.  Call if there is no improvement in 5 to 7 days, or sooner if you develop increasing pain, fever, or any new symptoms. Albuterol 2 inhalations every 6 hours as needed for shortness of breath or wheezing  Continue Advair Continue Mucinex  Prednisone twice daily for 5 days

## 2013-01-03 ENCOUNTER — Encounter (HOSPITAL_BASED_OUTPATIENT_CLINIC_OR_DEPARTMENT_OTHER): Payer: Self-pay | Admitting: *Deleted

## 2013-01-03 ENCOUNTER — Emergency Department (HOSPITAL_BASED_OUTPATIENT_CLINIC_OR_DEPARTMENT_OTHER): Payer: Medicare Other

## 2013-01-03 ENCOUNTER — Telehealth: Payer: Self-pay | Admitting: Internal Medicine

## 2013-01-03 ENCOUNTER — Emergency Department (HOSPITAL_BASED_OUTPATIENT_CLINIC_OR_DEPARTMENT_OTHER)
Admission: EM | Admit: 2013-01-03 | Discharge: 2013-01-03 | Disposition: A | Discharge status: Home / Self Care | Payer: Medicare Other | Attending: Emergency Medicine | Admitting: Emergency Medicine

## 2013-01-03 DIAGNOSIS — R059 Cough, unspecified: Secondary | ICD-10-CM | POA: Diagnosis not present

## 2013-01-03 DIAGNOSIS — IMO0002 Reserved for concepts with insufficient information to code with codable children: Secondary | ICD-10-CM | POA: Diagnosis not present

## 2013-01-03 DIAGNOSIS — J209 Acute bronchitis, unspecified: Secondary | ICD-10-CM | POA: Diagnosis not present

## 2013-01-03 DIAGNOSIS — J40 Bronchitis, not specified as acute or chronic: Secondary | ICD-10-CM | POA: Diagnosis not present

## 2013-01-03 DIAGNOSIS — J45909 Unspecified asthma, uncomplicated: Secondary | ICD-10-CM | POA: Diagnosis not present

## 2013-01-03 DIAGNOSIS — Z860100 Personal history of colon polyps, unspecified: Secondary | ICD-10-CM | POA: Insufficient documentation

## 2013-01-03 DIAGNOSIS — Z8719 Personal history of other diseases of the digestive system: Secondary | ICD-10-CM | POA: Diagnosis not present

## 2013-01-03 DIAGNOSIS — Z87891 Personal history of nicotine dependence: Secondary | ICD-10-CM | POA: Insufficient documentation

## 2013-01-03 DIAGNOSIS — Z7982 Long term (current) use of aspirin: Secondary | ICD-10-CM | POA: Insufficient documentation

## 2013-01-03 DIAGNOSIS — Z79899 Other long term (current) drug therapy: Secondary | ICD-10-CM | POA: Insufficient documentation

## 2013-01-03 DIAGNOSIS — I10 Essential (primary) hypertension: Secondary | ICD-10-CM | POA: Insufficient documentation

## 2013-01-03 DIAGNOSIS — R05 Cough: Secondary | ICD-10-CM | POA: Insufficient documentation

## 2013-01-03 MED ORDER — IPRATROPIUM BROMIDE 0.02 % IN SOLN
0.5000 mg | Freq: Once | RESPIRATORY_TRACT | Status: AC
  Administered 2013-01-03: 0.5 mg via RESPIRATORY_TRACT
  Filled 2013-01-03: qty 2.5

## 2013-01-03 MED ORDER — ALBUTEROL SULFATE (5 MG/ML) 0.5% IN NEBU
5.0000 mg | INHALATION_SOLUTION | Freq: Once | RESPIRATORY_TRACT | Status: AC
  Administered 2013-01-03: 5 mg via RESPIRATORY_TRACT
  Filled 2013-01-03: qty 1

## 2013-01-03 NOTE — ED Notes (Signed)
Pt states she finished previous antibiotics but is still having difficulty with SOB and coughing. Doctors office sent her to ED for chest xray.

## 2013-01-03 NOTE — Telephone Encounter (Signed)
Received call from CAN Terri regarding pt refusing to go to ED. Pt is SOB using inhaler every hour with no relief asked me to speak to pt.   Spoke to pt told her need to go to the ED to be evaluated for possible pneumonia due to breathing worse. Told pt see if someone can stay with her husband so she can go. Pt verbalized understanding and will go to ED to be evaluated.

## 2013-01-03 NOTE — ED Notes (Signed)
Sob. Was seen by her MD on Friday for bronchitis. No better.

## 2013-01-03 NOTE — ED Provider Notes (Signed)
History     CSN: [id number]  Arrival date & time 01/03/13  1752   First MD Initiated Contact with Patient 01/03/13 1808      Chief Complaint  Patient presents with  . Shortness of Breath    (Consider location/radiation/quality/duration/timing/severity/associated sxs/prior treatment) The history is provided by the patient.  Kristy [name] is a 72 y.o. female history of asthma, GERD here presenting with shortness of breath and cough. Cough and shortness of breath for the last week. She's been having nonproductive cough. She was started on prednisone 3 days ago and continued on albuterol and Advair. However she says she is not feeling better shows she sent here for rule out pneumonia. She denies any fevers or chills. Denies any leg swelling or history of DVT or PE. Denies any chest pain and no history of MIs.    Past Medical History  Diagnosis Date  . ALLERGIC RHINITIS 04/07/2007  . ASTHMA 04/07/2007  . COLONIC POLYPS, HX OF 04/07/2007  . GERD 04/07/2007  . HYPERTENSION 04/07/2007  . UNSPECIFIED DISORDER TEETH&SUPPORTING STRUCTURES 09/09/2010    Past Surgical History  Procedure Laterality Date  . Cholecystectomy    . Abdominal hysterectomy    . Laminectomy      No family history on file.  History  Substance Use Topics  . Smoking status: Former Smoker    Quit date: 10/19/1988  . Smokeless tobacco: Not on file  . Alcohol Use: 0.0 oz/week    OB History   Grav Para Term Preterm Abortions TAB SAB Ect Mult Living                  Review of Systems  Respiratory: Positive for cough and shortness of breath.   All other systems reviewed and are negative.    Allergies  Aspirin and Clonidine  Home Medications   Current Outpatient Rx  Name  Route  Sig  Dispense  Refill  . albuterol (PROVENTIL HFA;VENTOLIN HFA) 108 (90 BASE) MCG/ACT inhaler   Inhalation   Inhale 2 puffs into the lungs every 6 (six) hours as needed for wheezing.         Marland Kitchen Fexofenadine HCl (ALLEGRA PO)  Oral   Take by mouth.         . Fluticasone-Salmeterol (ADVAIR HFA IN)   Inhalation   Inhale into the lungs.         Marland Kitchen aspirin EC 81 MG tablet   Oral   Take 81 mg by mouth daily.           . benazepril (LOTENSIN) 40 MG tablet      TAKE 1 TABLET BY MOUTH EVERY DAY   90 tablet   3   . celecoxib (CELEBREX) 200 MG capsule   Oral   Take 1 capsule (200 mg total) by mouth daily.   90 capsule   6   . hydrochlorothiazide (HYDRODIURIL) 25 MG tablet      TAKE 1 TABLET BY MOUTH EVERY DAY   90 tablet   3   . predniSONE (DELTASONE) 20 MG tablet   Oral   Take 1 tablet (20 mg total) by mouth daily.   10 tablet   0   . traMADol (ULTRAM) 50 MG tablet   Oral   Take 1 tablet (50 mg total) by mouth every 6 (six) hours as needed.   90 tablet   4   . trolamine salicylate (ASPERCREME) 10 % cream   Topical   Apply topically as needed. pain  BP 147/90  Pulse 88  Temp(Src) 97.4 F (36.3 C) (Oral)  Resp 24  Wt 165 lb (74.844 kg)  BMI 31.68 kg/m2  SpO2 99%  Physical Exam  Nursing note and vitals reviewed. Constitutional: She is oriented to person, place, and time. She appears well-developed and well-nourished.  + coughing   HENT:  Head: Normocephalic.  Mouth/Throat: Oropharynx is clear and moist.  Eyes: Conjunctivae are normal. Pupils are equal, round, and reactive to light.  Neck: Normal range of motion. Neck supple.  Cardiovascular: Normal rate, regular rhythm and normal heart sounds.   Pulmonary/Chest:  + slightly tachypneic. + diffuse wheezing, no retractions. No crackles.   Abdominal: Soft. Bowel sounds are normal. She exhibits no distension. There is no tenderness. There is no rebound.  Musculoskeletal: Normal range of motion. She exhibits no edema and no tenderness.  Neurological: She is alert and oriented to person, place, and time.  Skin: Skin is warm and dry.  Psychiatric: She has a normal mood and affect. Her behavior is normal. Judgment and  thought content normal.    ED Course  Procedures (including critical care time)  Labs Reviewed - No data to display Dg Chest 2 View  01/03/2013  *RADIOLOGY REPORT*  Clinical Data: Cough and shortness of breath.  Ex-smoker.  CHEST - 2 VIEW  Comparison: None.  Findings: Normal sized heart.  Tortuous aorta.  Clear lungs.  Mild central peribronchial thickening.  Thoracic spine degenerative changes.  IMPRESSION: Mild bronchitic changes.   Original Report Authenticated By: Beckie Salts, M.D.      No diagnosis found.    MDM  Kristy [name] is a 72 y.o. female here with wheezing, SOB. Likely asthma exacerbation. She is on steroids already. Not hypoxic here. Comfortable and well appearing. Will get cxr to r/o pneumonia. Will give nebs and reassess.   6:56 PM Felt better after 1 neb. Less wheezing. CXR showed no pneumonia. I don't think she requires admission. Continue albuterol prn for now.        Richardean Canal, MD 01/03/13 [phone]

## 2013-01-03 NOTE — Telephone Encounter (Signed)
Patient Information:  Caller Name: Tikita  Phone: [phone]  Patient: Kristy [name]  Gender: Female  DOB: [birth year]  Age: 72 Years  PCP: Eleonore Chiquito [facility])  Office Follow Up:  Does the office need to follow up with this patient?: No  Instructions For The Office: N/A  RN Note:  Patient was seen on 12/30/12 in office and diagnosed with Bronchial-asthma.  Stated she was feeling better, but today is worse. Patient is talking in breathy short phrases-used inhaler about 60 mins. ago.  Coughing a lot with drainage mostly clear.  Cares for an elderly husband at home.  Is already on steroid from the visit and albuterol inhaler. Refused to go to ED or UC due to ill husband.  Stated she could go to office.  With office closing in 15 mins-Rn attempted to reach [place] [facility] [address] but was out of the office.  Transferred to Dr. Vernon Prey nurse who offered to speak to patient to get her to go to the care level needed.  Symptoms  Reason For Call & Symptoms: Was seen on Friday-12/30/12 for bronchospasms and asthma  Reviewed Health History In EMR: No  Reviewed Medications In EMR: No  Reviewed Allergies In EMR: Yes  Reviewed Surgeries / Procedures: Yes  Date of Onset of Symptoms: 01/03/2013  Treatments Tried: Medications have not helped  Treatments Tried Worked: No  Guideline(s) Used:  Asthma Attack  Disposition Per Guideline:   Go to ED Now (or to Office with PCP Approval)  Reason For Disposition Reached:   Patient sounds very sick or weak to the triager  Advice Given:  N/A  Patient Refused Recommendation:  Patient Refused Care Advice  Patient will not go to UC or ED but is willing to go to office.

## 2013-01-24 ENCOUNTER — Other Ambulatory Visit: Payer: Self-pay | Admitting: Internal Medicine

## 2013-02-15 DIAGNOSIS — M1990 Unspecified osteoarthritis, unspecified site: Secondary | ICD-10-CM | POA: Diagnosis not present

## 2013-02-15 DIAGNOSIS — M159 Polyosteoarthritis, unspecified: Secondary | ICD-10-CM | POA: Diagnosis not present

## 2013-02-15 DIAGNOSIS — Z79899 Other long term (current) drug therapy: Secondary | ICD-10-CM | POA: Diagnosis not present

## 2013-02-15 DIAGNOSIS — M2550 Pain in unspecified joint: Secondary | ICD-10-CM | POA: Diagnosis not present

## 2013-03-24 ENCOUNTER — Encounter: Payer: Medicare Other | Admitting: Internal Medicine

## 2013-03-24 ENCOUNTER — Telehealth: Payer: Self-pay | Admitting: Internal Medicine

## 2013-03-24 NOTE — Telephone Encounter (Signed)
Pt had 9:30am appt 6/10 for CPE.  Pt must come in fasting. Pt just found out her husband must have pre op done that very same morning as her appt. (Colon cancer).  Pt needs 30 min early am appt to come in fasting. Is it OK to put 2/ 15 min slots together?

## 2013-03-24 NOTE — Telephone Encounter (Signed)
appt set/kh 

## 2013-03-28 ENCOUNTER — Encounter: Payer: Medicare Other | Admitting: Internal Medicine

## 2013-03-30 ENCOUNTER — Encounter: Payer: Self-pay | Admitting: Internal Medicine

## 2013-03-30 ENCOUNTER — Ambulatory Visit (INDEPENDENT_AMBULATORY_CARE_PROVIDER_SITE_OTHER): Payer: Medicare Other | Admitting: Internal Medicine

## 2013-03-30 VITALS — BP 130/80 | HR 79 | Temp 98.1°F | Resp 20 | Ht 60.5 in | Wt 159.0 lb

## 2013-03-30 DIAGNOSIS — J45909 Unspecified asthma, uncomplicated: Secondary | ICD-10-CM | POA: Diagnosis not present

## 2013-03-30 DIAGNOSIS — Z0000 Encounter for general adult medical examination without abnormal findings: Secondary | ICD-10-CM

## 2013-03-30 DIAGNOSIS — M1990 Unspecified osteoarthritis, unspecified site: Secondary | ICD-10-CM

## 2013-03-30 DIAGNOSIS — I10 Essential (primary) hypertension: Secondary | ICD-10-CM

## 2013-03-30 LAB — LIPID PANEL
Cholesterol: 205 mg/dL — ABNORMAL HIGH (ref 0–200)
HDL: 60.3 mg/dL (ref >39.00)
Total CHOL/HDL Ratio: 3
VLDL: 22.4 mg/dL (ref 0.0–40.0)

## 2013-03-30 LAB — CBC WITH DIFFERENTIAL/PLATELET
Basophils Absolute: 0 10*3/uL (ref 0.0–0.1)
Basophils Relative: 0.6 % (ref 0.0–3.0)
Eosinophils Absolute: 0.2 10*3/uL (ref 0.0–0.7)
HCT: 39.7 % (ref 36.0–46.0)
Hemoglobin: 13.3 g/dL (ref 12.0–15.0)
Lymphocytes Relative: 40.7 % (ref 12.0–46.0)
Lymphs Abs: 2.6 10*3/uL (ref 0.7–4.0)
MCHC: 33.5 g/dL (ref 30.0–36.0)
MCV: 91.2 fl (ref 78.0–100.0)
Monocytes Absolute: 0.4 10*3/uL (ref 0.1–1.0)
Neutro Abs: 3 10*3/uL (ref 1.4–7.7)
RBC: 4.36 Mil/uL (ref 3.87–5.11)
RDW: 13.7 % (ref 11.5–14.6)

## 2013-03-30 LAB — COMPREHENSIVE METABOLIC PANEL
ALT: 15 U/L (ref 0–35)
AST: 16 U/L (ref 0–37)
Alkaline Phosphatase: 48 U/L (ref 39–117)
BUN: 15 mg/dL (ref 6–23)
Calcium: 9.8 mg/dL (ref 8.4–10.5)
Chloride: 103 mEq/L (ref 96–112)
Creatinine, Ser: 0.7 mg/dL (ref 0.4–1.2)
Total Bilirubin: 0.6 mg/dL (ref 0.3–1.2)

## 2013-03-30 LAB — TSH: TSH: 0.74 u[IU]/mL (ref 0.35–5.50)

## 2013-03-30 NOTE — Patient Instructions (Addendum)

## 2013-03-30 NOTE — Progress Notes (Signed)
Patient ID: Kristy [name], female   DOB: [birth year], 72 y.o.   MRN: [id number]  Subjective:    Patient ID: Kristy [name], female    DOB: 1940-11-09, 72 y.o.   MRN: [id number]  HPI  72 year-old patient who is seen today for a preventive health examination. She does quite well for history of hypertension. She does have a history of allergic rhinitis and asthma that didn't feel well this past print she has gastroesophageal reflux disease. Her most pressing problem is osteoarthritis affecting primarily the small joints of the hands. She uses Celebrex and also tramadol as needed. She is maintained nice blood pressure control.  She had a colonoscopy 2 years ago She had a mammogram performed last month  Past Medical History  Diagnosis Date  . ALLERGIC RHINITIS 04/07/2007  . ASTHMA 04/07/2007  . COLONIC POLYPS, HX OF 04/07/2007  . GERD 04/07/2007  . HYPERTENSION 04/07/2007  . UNSPECIFIED DISORDER TEETH&SUPPORTING STRUCTURES 09/09/2010    History   Social History  . Marital Status: Married    Spouse Name: N/A    Number of Children: N/A  . Years of Education: N/A   Occupational History  . Not on file.   Social History Main Topics  . Smoking status: Former Smoker    Quit date: 10/19/1988  . Smokeless tobacco: Not on file  . Alcohol Use: 0.0 oz/week  . Drug Use: Not on file  . Sexually Active: Not on file   Other Topics Concern  . Not on file   Social History Narrative  . No narrative on file    Past Surgical History  Procedure Laterality Date  . Cholecystectomy    . Abdominal hysterectomy    . Laminectomy      No family history on file.  Allergies  Allergen Reactions  . Aspirin Itching  . Clonidine Hives    Current Outpatient Prescriptions on File Prior to Visit  Medication Sig Dispense Refill  . albuterol (PROVENTIL HFA;VENTOLIN HFA) 108 (90 BASE) MCG/ACT inhaler Inhale 2 puffs into the lungs every 6 (six) hours as needed for wheezing.      Marland Kitchen aspirin EC 81 MG tablet Take 81  mg by mouth daily.        . benazepril (LOTENSIN) 40 MG tablet TAKE 1 TABLET BY MOUTH EVERY DAY  90 tablet  3  . celecoxib (CELEBREX) 200 MG capsule Take 1 capsule (200 mg total) by mouth daily.  90 capsule  6  . Fexofenadine HCl (ALLEGRA PO) Take by mouth.      . Fluticasone-Salmeterol (ADVAIR HFA IN) Inhale into the lungs.      . hydrochlorothiazide (HYDRODIURIL) 25 MG tablet TAKE 1 TABLET BY MOUTH EVERY DAY  90 tablet  3  . traMADol (ULTRAM) 50 MG tablet Take 1 tablet (50 mg total) by mouth every 6 (six) hours as needed.  90 tablet  4  . trolamine salicylate (ASPERCREME) 10 % cream Apply topically as needed. pain        No current facility-administered medications on file prior to visit.    BP 130/80  Pulse 79  Temp(Src) 98.1 F (36.7 C) (Oral)  Resp 20  Ht 5' 0.5" (1.537 m)  Wt 159 lb (72.122 kg)  BMI 30.53 kg/m2  SpO2 94%  1. Risk factors, based on past  M,S,F history-   cardiovascular risk factors include a history of hypertension  2.  Physical activities: Remains quite active walks daily and bowls frequently does care for her disabled husband who  has pulmonary hypertension and is on chronic oxygen therapy  3.  Depression/mood: No history depression or mood disorder  4.  Hearing: No deficits  5.  ADL's: Independent in all aspects of daily living  6.  Fall risk: Low  7.  Home safety: No problems identified  8.  Height weight, and visual acuity; height and weight stable no change in visual acuity  9.  Counseling: Heart healthy diet regular exercise all encouraged  10. Lab orders based on risk factors: Laboratory profile including lipid panel will be reviewed  11. Referral : Not appropriate at this time  12. Care plan: Heart healthy diet regular exercise encouraged. Home blood pressure monitoring encouraged 13. Cognitive assessment: Alert and appropriate with normal affect no cognitive dysfunction        Review of Systems  Constitutional: Negative for fever,  appetite change, fatigue and unexpected weight change.  HENT: Negative for hearing loss, ear pain, nosebleeds, congestion, sore throat, mouth sores, trouble swallowing, neck stiffness, dental problem, voice change, sinus pressure and tinnitus.   Eyes: Negative for photophobia, pain, redness and visual disturbance.  Respiratory: Negative for cough, chest tightness and shortness of breath.   Cardiovascular: Negative for chest pain, palpitations and leg swelling.  Gastrointestinal: Negative for nausea, vomiting, abdominal pain, diarrhea, constipation, blood in stool, abdominal distention and rectal pain.  Genitourinary: Negative for dysuria, urgency, frequency, hematuria, flank pain, vaginal bleeding, vaginal discharge, difficulty urinating, genital sores, vaginal pain, menstrual problem and pelvic pain.  Musculoskeletal: Positive for joint swelling and arthralgias. Negative for back pain.  Skin: Negative for rash.  Neurological: Negative for dizziness, syncope, speech difficulty, weakness, light-headedness, numbness and headaches.  Hematological: Negative for adenopathy. Does not bruise/bleed easily.  Psychiatric/Behavioral: Negative for suicidal ideas, behavioral problems, self-injury, dysphoric mood and agitation. The patient is not nervous/anxious.        Objective:   Physical Exam  Constitutional: She is oriented to person, place, and time. She appears well-developed and well-nourished.  Weight 159. Blood pressure 130/82 (the medications today)  HENT:  Head: Normocephalic and atraumatic.  Right Ear: External ear normal.  Left Ear: External ear normal.  Mouth/Throat: Oropharynx is clear and moist.  Eyes: Conjunctivae and EOM are normal.  Neck: Normal range of motion. Neck supple. No JVD present. No thyromegaly present.  Cardiovascular: Normal rate, regular rhythm, normal heart sounds and intact distal pulses.   No murmur heard. Pulmonary/Chest: Effort normal and breath sounds normal. She  has no wheezes. She has no rales.  Abdominal: Soft. Bowel sounds are normal. She exhibits no distension and no mass. There is no tenderness. There is no rebound and no guarding.  Right upper quadrant scar  Genitourinary: Vagina normal.  Musculoskeletal: Normal range of motion. She exhibits no edema and no tenderness.  Neurological: She is alert and oriented to person, place, and time. She has normal reflexes. No cranial nerve deficit. She exhibits normal muscle tone. Coordination normal.  Skin: Skin is warm and dry. No rash noted.  Psychiatric: She has a normal mood and affect. Her behavior is normal.          Assessment & Plan:   Preventive health examination Hypertension stable we'll continue present regimen we'll continue low-salt diet and exercise program modest weight loss encouraged Asthma/allergic rhinitis stable Osteoarthritis  Recheck 6 months

## 2013-06-26 ENCOUNTER — Other Ambulatory Visit: Payer: Self-pay | Admitting: Internal Medicine

## 2013-08-18 DIAGNOSIS — M1990 Unspecified osteoarthritis, unspecified site: Secondary | ICD-10-CM | POA: Diagnosis not present

## 2013-08-18 DIAGNOSIS — M2550 Pain in unspecified joint: Secondary | ICD-10-CM | POA: Diagnosis not present

## 2013-08-18 DIAGNOSIS — Z791 Long term (current) use of non-steroidal anti-inflammatories (NSAID): Secondary | ICD-10-CM | POA: Diagnosis not present

## 2013-08-18 DIAGNOSIS — M19049 Primary osteoarthritis, unspecified hand: Secondary | ICD-10-CM | POA: Diagnosis not present

## 2013-08-18 DIAGNOSIS — M25579 Pain in unspecified ankle and joints of unspecified foot: Secondary | ICD-10-CM | POA: Diagnosis not present

## 2013-09-01 DIAGNOSIS — Z1231 Encounter for screening mammogram for malignant neoplasm of breast: Secondary | ICD-10-CM | POA: Diagnosis not present

## 2013-09-29 ENCOUNTER — Ambulatory Visit: Payer: Medicare Other | Admitting: Internal Medicine

## 2013-09-30 ENCOUNTER — Encounter: Payer: Self-pay | Admitting: Internal Medicine

## 2013-10-06 ENCOUNTER — Encounter: Payer: Self-pay | Admitting: *Deleted

## 2013-10-09 ENCOUNTER — Ambulatory Visit (INDEPENDENT_AMBULATORY_CARE_PROVIDER_SITE_OTHER): Payer: Medicare Other | Admitting: Internal Medicine

## 2013-10-09 ENCOUNTER — Encounter: Payer: Self-pay | Admitting: Internal Medicine

## 2013-10-09 VITALS — BP 140/80 | Temp 98.7°F | Wt 164.0 lb

## 2013-10-09 DIAGNOSIS — I10 Essential (primary) hypertension: Secondary | ICD-10-CM

## 2013-10-09 DIAGNOSIS — J45909 Unspecified asthma, uncomplicated: Secondary | ICD-10-CM | POA: Diagnosis not present

## 2013-10-09 DIAGNOSIS — M1990 Unspecified osteoarthritis, unspecified site: Secondary | ICD-10-CM | POA: Diagnosis not present

## 2013-10-09 DIAGNOSIS — Z23 Encounter for immunization: Secondary | ICD-10-CM

## 2013-10-09 MED ORDER — TRAMADOL HCL 50 MG PO TABS: 50.0000 mg | ORAL_TABLET | Freq: Four times a day (QID) | ORAL | Status: DC | PRN

## 2013-10-09 NOTE — Progress Notes (Signed)
Subjective:    Patient ID: Kristy [name], female    DOB: [birth year], 72 y.o.   MRN: [id number]  HPI  72 year old patient who is seen today for followup of hypertension. This remains well controlled on combination therapy. She has significant symptomatic osteoarthritis and this is her chief complaint today. She remains on Celebrex daily as well as tramadol. She has been seen by rheumatology. She has a history of asthma allergic rhinitis which is well-controlled on her present medications. She does have a history of colonic polyps and did have colonoscopy in 2012. She is under considerable situational stress due to the poor health of her husband and also his primary caregiver for her elderly mother  Past Medical History  Diagnosis Date  . ALLERGIC RHINITIS 04/07/2007  . ASTHMA 04/07/2007  . COLONIC POLYPS, HX OF 04/07/2007  . GERD 04/07/2007  . HYPERTENSION 04/07/2007  . UNSPECIFIED DISORDER TEETH&SUPPORTING STRUCTURES 09/09/2010    History   Social History  . Marital Status: Married    Spouse Name: N/A    Number of Children: N/A  . Years of Education: N/A   Occupational History  . Not on file.   Social History Main Topics  . Smoking status: Former Smoker    Quit date: 10/19/1988  . Smokeless tobacco: Not on file  . Alcohol Use: 0.0 oz/week  . Drug Use: Not on file  . Sexual Activity: Not on file   Other Topics Concern  . Not on file   Social History Narrative  . No narrative on file    Past Surgical History  Procedure Laterality Date  . Cholecystectomy    . Abdominal hysterectomy    . Laminectomy      No family history on file.  Allergies  Allergen Reactions  . Aspirin Itching  . Clonidine Hives    Current Outpatient Prescriptions on File Prior to Visit  Medication Sig Dispense Refill  . albuterol (PROVENTIL HFA;VENTOLIN HFA) 108 (90 BASE) MCG/ACT inhaler Inhale 2 puffs into the lungs every 6 (six) hours as needed for wheezing.      Marland Kitchen aspirin EC 81 MG tablet  Take 81 mg by mouth daily.        . benazepril (LOTENSIN) 40 MG tablet TAKE 1 TABLET BY MOUTH EVERY DAY  90 tablet  3  . celecoxib (CELEBREX) 200 MG capsule Take 1 capsule (200 mg total) by mouth daily.  90 capsule  6  . Fexofenadine HCl (ALLEGRA PO) Take by mouth.      . Fluticasone-Salmeterol (ADVAIR HFA IN) Inhale into the lungs.      . hydrochlorothiazide (HYDRODIURIL) 25 MG tablet TAKE 1 TABLET BY MOUTH EVERY DAY  90 tablet  3  . traMADol (ULTRAM) 50 MG tablet Take 1 tablet (50 mg total) by mouth every 6 (six) hours as needed.  90 tablet  4  . trolamine salicylate (ASPERCREME) 10 % cream Apply topically as needed. pain        No current facility-administered medications on file prior to visit.    BP 140/80  Temp(Src) 98.7 F (37.1 C) (Oral)  Wt 164 lb (74.39 kg)      Review of Systems  Constitutional: Negative.   HENT: Negative for congestion, dental problem, hearing loss, rhinorrhea, sinus pressure, sore throat and tinnitus.   Eyes: Negative for pain, discharge and visual disturbance.  Respiratory: Negative for cough and shortness of breath.   Cardiovascular: Negative for chest pain, palpitations and leg swelling.  Gastrointestinal: Negative for nausea, vomiting,  abdominal pain, diarrhea, constipation, blood in stool and abdominal distention.  Genitourinary: Negative for dysuria, urgency, frequency, hematuria, flank pain, vaginal bleeding, vaginal discharge, difficulty urinating, vaginal pain and pelvic pain.  Musculoskeletal: Positive for arthralgias, back pain, neck pain and neck stiffness. Negative for gait problem and joint swelling.  Skin: Negative for rash.  Neurological: Negative for dizziness, syncope, speech difficulty, weakness, numbness and headaches.  Hematological: Negative for adenopathy.  Psychiatric/Behavioral: Positive for dysphoric mood. Negative for behavioral problems and agitation. The patient is not nervous/anxious.        Objective:   Physical Exam    Constitutional: She is oriented to person, place, and time. She appears well-developed and well-nourished.  HENT:  Head: Normocephalic.  Right Ear: External ear normal.  Left Ear: External ear normal.  Mouth/Throat: Oropharynx is clear and moist.  Eyes: Conjunctivae and EOM are normal. Pupils are equal, round, and reactive to light.  Neck: Normal range of motion. Neck supple. No thyromegaly present.  Cardiovascular: Normal rate, regular rhythm, normal heart sounds and intact distal pulses.   Pulmonary/Chest: Effort normal and breath sounds normal.  Abdominal: Soft. Bowel sounds are normal. She exhibits no mass. There is no tenderness.  Musculoskeletal: Normal range of motion.  Osteoarthritic changes of the small joints of the hands  Lymphadenopathy:    She has no cervical adenopathy.  Neurological: She is alert and oriented to person, place, and time.  Skin: Skin is warm and dry. No rash noted.  Psychiatric: She has a normal mood and affect. Her behavior is normal.          Assessment & Plan:   Hypertension well controlled Asthma/allergic rhinitis stable Oseoarthritis. Will continue present regimen  All medications updated Pneumonia vaccine and tetanus vaccine administered CPX 6 months he

## 2013-10-09 NOTE — Patient Instructions (Signed)
Limit your sodium (Salt) intake    It is important that you exercise regularly, at least 20 minutes 3 to 4 times per week.  If you develop chest pain or shortness of breath seek  medical attention.  Return in 6 months for follow-up  

## 2013-10-09 NOTE — Progress Notes (Signed)
Pre visit review using our clinic review tool, if applicable. No additional management support is needed unless otherwise documented below in the visit note. 

## 2013-11-20 DIAGNOSIS — M2550 Pain in unspecified joint: Secondary | ICD-10-CM | POA: Diagnosis not present

## 2013-11-20 DIAGNOSIS — Z791 Long term (current) use of non-steroidal anti-inflammatories (NSAID): Secondary | ICD-10-CM | POA: Diagnosis not present

## 2013-11-20 DIAGNOSIS — M1990 Unspecified osteoarthritis, unspecified site: Secondary | ICD-10-CM | POA: Diagnosis not present

## 2013-11-20 DIAGNOSIS — M19049 Primary osteoarthritis, unspecified hand: Secondary | ICD-10-CM | POA: Diagnosis not present

## 2014-01-11 ENCOUNTER — Other Ambulatory Visit: Payer: Self-pay | Admitting: Internal Medicine

## 2014-01-12 DIAGNOSIS — M76899 Other specified enthesopathies of unspecified lower limb, excluding foot: Secondary | ICD-10-CM | POA: Diagnosis not present

## 2014-01-17 ENCOUNTER — Ambulatory Visit: Payer: Medicare Other | Admitting: Internal Medicine

## 2014-01-23 ENCOUNTER — Ambulatory Visit: Payer: Medicare Other | Admitting: Internal Medicine

## 2014-01-24 ENCOUNTER — Telehealth: Payer: Self-pay | Admitting: Internal Medicine

## 2014-01-24 ENCOUNTER — Encounter: Payer: Self-pay | Admitting: Internal Medicine

## 2014-01-24 ENCOUNTER — Ambulatory Visit (INDEPENDENT_AMBULATORY_CARE_PROVIDER_SITE_OTHER): Payer: Medicare Other | Admitting: Internal Medicine

## 2014-01-24 VITALS — BP 140/80 | HR 73 | Temp 98.3°F | Resp 20 | Ht 60.5 in | Wt 160.0 lb

## 2014-01-24 DIAGNOSIS — I10 Essential (primary) hypertension: Secondary | ICD-10-CM

## 2014-01-24 DIAGNOSIS — L678 Other hair color and hair shaft abnormalities: Secondary | ICD-10-CM | POA: Diagnosis not present

## 2014-01-24 DIAGNOSIS — L738 Other specified follicular disorders: Secondary | ICD-10-CM | POA: Diagnosis not present

## 2014-01-24 DIAGNOSIS — L739 Follicular disorder, unspecified: Secondary | ICD-10-CM

## 2014-01-24 MED ORDER — DOXYCYCLINE HYCLATE 100 MG PO TABS: 100.0000 mg | ORAL_TABLET | Freq: Two times a day (BID) | ORAL | Status: DC

## 2014-01-24 NOTE — Telephone Encounter (Signed)
Relevant patient education assigned to patient using Emmi. ° °

## 2014-01-24 NOTE — Progress Notes (Signed)
Subjective:    Patient ID: Kristy [name], female    DOB: 1941/01/23, 73 y.o.   MRN: [id number]  HPI  73 year old patient who has a family history of breast cancer.  Her mother was affected.  She did have a mammogram performed in December of last year.  She presents with a inflammatory nodule over the right, breast.  That's been the present off and on for the past 4 weeks.  Past Medical History  Diagnosis Date  . ALLERGIC RHINITIS 04/07/2007  . ASTHMA 04/07/2007  . COLONIC POLYPS, HX OF 04/07/2007  . GERD 04/07/2007  . HYPERTENSION 04/07/2007  . UNSPECIFIED DISORDER TEETH&SUPPORTING STRUCTURES 09/09/2010    History   Social History  . Marital Status: Married    Spouse Name: N/A    Number of Children: N/A  . Years of Education: N/A   Occupational History  . Not on file.   Social History Main Topics  . Smoking status: Former Smoker    Quit date: 10/19/1988  . Smokeless tobacco: Not on file  . Alcohol Use: 0.0 oz/week  . Drug Use: Not on file  . Sexual Activity: Not on file   Other Topics Concern  . Not on file   Social History Narrative  . No narrative on file    Past Surgical History  Procedure Laterality Date  . Cholecystectomy    . Abdominal hysterectomy    . Laminectomy      No family history on file.  Allergies  Allergen Reactions  . Aspirin Itching  . Clonidine Hives    Current Outpatient Prescriptions on File Prior to Visit  Medication Sig Dispense Refill  . albuterol (PROVENTIL HFA;VENTOLIN HFA) 108 (90 BASE) MCG/ACT inhaler Inhale 2 puffs into the lungs every 6 (six) hours as needed for wheezing.      . benazepril (LOTENSIN) 40 MG tablet TAKE 1 TABLET BY MOUTH EVERY DAY  90 tablet  3  . CELEBREX 200 MG capsule TAKE 1 CAPSULE (200 MG TOTAL) BY MOUTH DAILY.  90 capsule  0  . Fexofenadine HCl (ALLEGRA PO) Take by mouth.      . Fluticasone-Salmeterol (ADVAIR HFA IN) Inhale into the lungs.      . hydrochlorothiazide (HYDRODIURIL) 25 MG tablet TAKE 1 TABLET  BY MOUTH EVERY DAY  90 tablet  0  . traMADol (ULTRAM) 50 MG tablet Take 1 tablet (50 mg total) by mouth every 6 (six) hours as needed.  90 tablet  4  . trolamine salicylate (ASPERCREME) 10 % cream Apply topically as needed. pain        No current facility-administered medications on file prior to visit.    BP 140/80  Pulse 73  Temp(Src) 98.3 F (36.8 C) (Oral)  Resp 20  Ht 5' 0.5" (1.537 m)  Wt 160 lb (72.576 kg)  BMI 30.72 kg/m2  SpO2 96%       Review of Systems  Constitutional: Negative.   HENT: Negative for congestion, dental problem, hearing loss, rhinorrhea, sinus pressure, sore throat and tinnitus.   Eyes: Negative for pain, discharge and visual disturbance.  Respiratory: Negative for cough and shortness of breath.   Cardiovascular: Negative for chest pain, palpitations and leg swelling.  Gastrointestinal: Negative for nausea, vomiting, abdominal pain, diarrhea, constipation, blood in stool and abdominal distention.  Genitourinary: Negative for dysuria, urgency, frequency, hematuria, flank pain, vaginal bleeding, vaginal discharge, difficulty urinating, vaginal pain and pelvic pain.  Musculoskeletal: Negative for arthralgias, gait problem and joint swelling.  Skin: Positive for  rash.  Neurological: Negative for dizziness, syncope, speech difficulty, weakness, numbness and headaches.  Hematological: Negative for adenopathy.  Psychiatric/Behavioral: Negative for behavioral problems, dysphoric mood and agitation. The patient is not nervous/anxious.        Objective:   Physical Exam  Constitutional: She appears well-developed and well-nourished. No distress.  Skin:  A small pustule noted over the right lateral breast.  Breast exam unremarkable          Assessment & Plan:   Small pustule skin overlying right breast.  Local wound care discussed. We'll place on doxycycline for one week  Hypertension stable

## 2014-01-24 NOTE — Progress Notes (Signed)
Pre-visit discussion using our clinic review tool. No additional management support is needed unless otherwise documented below in the visit note.  

## 2014-01-24 NOTE — Patient Instructions (Addendum)
Folliculitis  Folliculitis is redness, soreness, and swelling (inflammation) of the hair follicles. This condition can occur anywhere on the body. People with weakened immune systems, diabetes, or obesity have a greater risk of getting folliculitis. CAUSES  Bacterial infection. This is the most common cause.  Fungal infection.  Viral infection.  Contact with certain chemicals, especially oils and tars. Long-term folliculitis can result from bacteria that live in the nostrils. The bacteria may trigger multiple outbreaks of folliculitis over time. SYMPTOMS Folliculitis most commonly occurs on the scalp, thighs, legs, back, buttocks, and areas where hair is shaved frequently. An early sign of folliculitis is a small, white or yellow, pus-filled, itchy lesion (pustule). These lesions appear on a red, inflamed follicle. They are usually less than 0.2 inches (5 mm) wide. When there is an infection of the follicle that goes deeper, it becomes a boil or furuncle. A group of closely packed boils creates a larger lesion (carbuncle). Carbuncles tend to occur in hairy, sweaty areas of the body. DIAGNOSIS  Your caregiver can usually tell what is wrong by doing a physical exam. A sample may be taken from one of the lesions and tested in a lab. This can help determine what is causing your folliculitis. TREATMENT  Treatment may include:  Applying warm compresses to the affected areas.  Taking antibiotic medicines orally or applying them to the skin.  Draining the lesions if they contain a large amount of pus or fluid.  Laser hair removal for cases of long-lasting folliculitis. This helps to prevent regrowth of the hair. HOME CARE INSTRUCTIONS  Apply warm compresses to the affected areas as directed by your caregiver.  If antibiotics are prescribed, take them as directed. Finish them even if you start to feel better.  You may take over-the-counter medicines to relieve itching.  Do not shave  irritated skin.  Follow up with your caregiver as directed. SEEK IMMEDIATE MEDICAL CARE IF:   You have increasing redness, swelling, or pain in the affected area.  You have a fever. MAKE SURE YOU:  Understand these instructions.  Will watch your condition.  Will get help right away if you are not doing well or get worse. Document Released: 12/14/2001 Document Revised: 04/05/2012 Document Reviewed: 01/05/2012 [facility] Patient Information 2014 [place], [place].   Call or return to clinic prn if these symptoms worsen or fail to improve as anticipated.  Take your antibiotic as prescribed until ALL of it is gone, but stop if you develop a rash, swelling, or any side effects of the medication.  Contact our office as soon as possible if  there are side effects of the medication.

## 2014-02-19 DIAGNOSIS — Z791 Long term (current) use of non-steroidal anti-inflammatories (NSAID): Secondary | ICD-10-CM | POA: Diagnosis not present

## 2014-02-19 DIAGNOSIS — M2550 Pain in unspecified joint: Secondary | ICD-10-CM | POA: Diagnosis not present

## 2014-02-19 DIAGNOSIS — M1990 Unspecified osteoarthritis, unspecified site: Secondary | ICD-10-CM | POA: Diagnosis not present

## 2014-02-19 DIAGNOSIS — M19049 Primary osteoarthritis, unspecified hand: Secondary | ICD-10-CM | POA: Diagnosis not present

## 2014-03-08 ENCOUNTER — Other Ambulatory Visit: Payer: Self-pay | Admitting: Internal Medicine

## 2014-04-02 ENCOUNTER — Encounter: Payer: Medicare Other | Admitting: Internal Medicine

## 2014-04-02 DIAGNOSIS — Z0289 Encounter for other administrative examinations: Secondary | ICD-10-CM

## 2014-04-22 ENCOUNTER — Other Ambulatory Visit: Payer: Self-pay | Admitting: Internal Medicine

## 2014-06-05 ENCOUNTER — Encounter: Payer: Self-pay | Admitting: Internal Medicine

## 2014-06-05 ENCOUNTER — Ambulatory Visit (INDEPENDENT_AMBULATORY_CARE_PROVIDER_SITE_OTHER): Payer: Medicare Other | Admitting: Internal Medicine

## 2014-06-05 ENCOUNTER — Encounter: Payer: Self-pay | Admitting: *Deleted

## 2014-06-05 ENCOUNTER — Ambulatory Visit (INDEPENDENT_AMBULATORY_CARE_PROVIDER_SITE_OTHER)
Admission: RE | Admit: 2014-06-05 | Discharge: 2014-06-05 | Disposition: A | Discharge status: Home / Self Care | Payer: Medicare Other | Source: Ambulatory Visit | Attending: Internal Medicine | Admitting: Internal Medicine

## 2014-06-05 VITALS — BP 162/90 | HR 69 | Temp 98.0°F | Resp 20 | Ht 60.5 in | Wt 159.0 lb

## 2014-06-05 DIAGNOSIS — M25519 Pain in unspecified shoulder: Secondary | ICD-10-CM | POA: Diagnosis not present

## 2014-06-05 DIAGNOSIS — M7989 Other specified soft tissue disorders: Secondary | ICD-10-CM | POA: Diagnosis not present

## 2014-06-05 DIAGNOSIS — M25512 Pain in left shoulder: Secondary | ICD-10-CM

## 2014-06-05 DIAGNOSIS — I10 Essential (primary) hypertension: Secondary | ICD-10-CM

## 2014-06-05 NOTE — Patient Instructions (Signed)
X-ray evaluation as discussed

## 2014-06-05 NOTE — Progress Notes (Signed)
Pre visit review using our clinic review tool, if applicable. No additional management support is needed unless otherwise documented below in the visit note. 

## 2014-06-05 NOTE — Progress Notes (Signed)
Subjective:    Patient ID: Kristy [name], female    DOB: 18-Dec-1940, 73 y.o.   MRN: [id number]  HPI  73 year old patient, who presents with a two-week history of fullness and tenderness involving the left upper anterior chest wall area.  She has noted a painful nodule just lateral to the upper sternal area.  She also describes some mild left-sided headache and sore throat  for the past few days.  No recent trauma  No fever, or other constitutional complaints  Past Medical History  Diagnosis Date  . ALLERGIC RHINITIS 04/07/2007  . ASTHMA 04/07/2007  . COLONIC POLYPS, HX OF 04/07/2007  . GERD 04/07/2007  . HYPERTENSION 04/07/2007  . UNSPECIFIED DISORDER TEETH&SUPPORTING STRUCTURES 09/09/2010    History   Social History  . Marital Status: Married    Spouse Name: N/A    Number of Children: N/A  . Years of Education: N/A   Occupational History  . Not on file.   Social History Main Topics  . Smoking status: Former Smoker    Quit date: 10/19/1988  . Smokeless tobacco: Not on file  . Alcohol Use: 0.0 oz/week  . Drug Use: Not on file  . Sexual Activity: Not on file   Other Topics Concern  . Not on file   Social History Narrative  . No narrative on file    Past Surgical History  Procedure Laterality Date  . Cholecystectomy    . Abdominal hysterectomy    . Laminectomy      No family history on file.  Allergies  Allergen Reactions  . Aspirin Itching  . Clonidine Hives    Current Outpatient Prescriptions on File Prior to Visit  Medication Sig Dispense Refill  . albuterol (PROVENTIL HFA;VENTOLIN HFA) 108 (90 BASE) MCG/ACT inhaler Inhale 2 puffs into the lungs every 6 (six) hours as needed for wheezing.      . benazepril (LOTENSIN) 40 MG tablet TAKE 1 TABLET BY MOUTH EVERY DAY  90 tablet  3  . celecoxib (CELEBREX) 200 MG capsule TAKE 1 CAPSULE (200 MG TOTAL) BY MOUTH DAILY.  90 capsule  0  . Fexofenadine HCl (ALLEGRA PO) Take by mouth.      . Fluticasone-Salmeterol  (ADVAIR HFA IN) Inhale into the lungs.      . hydrochlorothiazide (HYDRODIURIL) 25 MG tablet TAKE 1 TABLET BY MOUTH EVERY DAY  90 tablet  0  . traMADol (ULTRAM) 50 MG tablet TAKE 1 TABLET EVERY 6 HOURS AS NEEDED FOR PAIN  90 tablet  3  . trolamine salicylate (ASPERCREME) 10 % cream Apply topically as needed. pain        No current facility-administered medications on file prior to visit.    BP 162/90  Pulse 69  Temp(Src) 98 F (36.7 C) (Oral)  Resp 20  Ht 5' 0.5" (1.537 m)  Wt 159 lb (72.122 kg)  BMI 30.53 kg/m2  SpO2 96%     Review of Systems  Constitutional: Negative.   HENT: Negative for congestion, dental problem, hearing loss, rhinorrhea, sinus pressure, sore throat and tinnitus.   Eyes: Negative for pain, discharge and visual disturbance.  Respiratory: Negative for cough and shortness of breath.   Cardiovascular: Negative for chest pain, palpitations and leg swelling.  Gastrointestinal: Negative for nausea, vomiting, abdominal pain, diarrhea, constipation, blood in stool and abdominal distention.  Genitourinary: Negative for dysuria, urgency, frequency, hematuria, flank pain, vaginal bleeding, vaginal discharge, difficulty urinating, vaginal pain and pelvic pain.  Musculoskeletal: Positive for joint swelling. Negative for  arthralgias and gait problem.  Skin: Negative for rash.  Neurological: Negative for dizziness, syncope, speech difficulty, weakness, numbness and headaches.  Hematological: Negative for adenopathy.  Psychiatric/Behavioral: Negative for behavioral problems, dysphoric mood and agitation. The patient is not nervous/anxious.        Objective:   Physical Exam  Constitutional: She is oriented to person, place, and time. She appears well-developed and well-nourished. No distress.  HENT:  Head: Normocephalic.  Right Ear: External ear normal.  Left Ear: External ear normal.  Mouth/Throat: Oropharynx is clear and moist.  Eyes: Conjunctivae and EOM are  normal. Pupils are equal, round, and reactive to light.  Neck: Normal range of motion. Neck supple. No thyromegaly present.  Cardiovascular: Normal rate, regular rhythm, normal heart sounds and intact distal pulses.   Pulmonary/Chest: Effort normal and breath sounds normal.  Abdominal: She exhibits no mass. There is no tenderness.  Musculoskeletal:  Soft tissue swelling and tenderness about the left sternoclavicular joint  Lymphadenopathy:    She has no cervical adenopathy.  Neurological: She is alert and oriented to person, place, and time.  Skin: No rash noted.  Psychiatric: She has a normal mood and affect. Her behavior is normal.          Assessment & Plan:   Pain and tenderness left sternoclavicular joint.  Rule out dislocation, arthritis, other etiologies.  Will check a x-ray of the left clavicle.  Continue Celebrex and tramadol

## 2014-06-12 DIAGNOSIS — M949 Disorder of cartilage, unspecified: Secondary | ICD-10-CM | POA: Diagnosis not present

## 2014-06-12 DIAGNOSIS — Z791 Long term (current) use of non-steroidal anti-inflammatories (NSAID): Secondary | ICD-10-CM | POA: Diagnosis not present

## 2014-06-12 DIAGNOSIS — M19049 Primary osteoarthritis, unspecified hand: Secondary | ICD-10-CM | POA: Diagnosis not present

## 2014-06-12 DIAGNOSIS — M899 Disorder of bone, unspecified: Secondary | ICD-10-CM | POA: Diagnosis not present

## 2014-06-12 DIAGNOSIS — M1990 Unspecified osteoarthritis, unspecified site: Secondary | ICD-10-CM | POA: Diagnosis not present

## 2014-06-13 ENCOUNTER — Other Ambulatory Visit: Payer: Self-pay | Admitting: Internal Medicine

## 2014-07-25 ENCOUNTER — Other Ambulatory Visit: Payer: Self-pay | Admitting: Internal Medicine

## 2014-07-26 ENCOUNTER — Other Ambulatory Visit: Payer: Self-pay | Admitting: Internal Medicine

## 2014-08-07 ENCOUNTER — Ambulatory Visit (INDEPENDENT_AMBULATORY_CARE_PROVIDER_SITE_OTHER): Payer: Medicare Other | Admitting: Internal Medicine

## 2014-08-07 ENCOUNTER — Encounter: Payer: Self-pay | Admitting: Internal Medicine

## 2014-08-07 VITALS — BP 150/86 | HR 76 | Temp 98.2°F | Resp 20 | Ht 60.75 in | Wt 162.0 lb

## 2014-08-07 DIAGNOSIS — Z23 Encounter for immunization: Secondary | ICD-10-CM | POA: Diagnosis not present

## 2014-08-07 DIAGNOSIS — M159 Polyosteoarthritis, unspecified: Secondary | ICD-10-CM

## 2014-08-07 DIAGNOSIS — Z0000 Encounter for general adult medical examination without abnormal findings: Secondary | ICD-10-CM | POA: Diagnosis not present

## 2014-08-07 DIAGNOSIS — I10 Essential (primary) hypertension: Secondary | ICD-10-CM

## 2014-08-07 DIAGNOSIS — M150 Primary generalized (osteo)arthritis: Secondary | ICD-10-CM | POA: Diagnosis not present

## 2014-08-07 LAB — COMPREHENSIVE METABOLIC PANEL
ALBUMIN: 3.7 g/dL (ref 3.5–5.2)
ALT: 26 U/L (ref 0–35)
AST: 21 U/L (ref 0–37)
Alkaline Phosphatase: 53 U/L (ref 39–117)
BUN: 15 mg/dL (ref 6–23)
CALCIUM: 9.5 mg/dL (ref 8.4–10.5)
CHLORIDE: 102 meq/L (ref 96–112)
CO2: 30 mEq/L (ref 19–32)
Creatinine, Ser: 0.7 mg/dL (ref 0.4–1.2)
GFR: 99.92 mL/min (ref >60.00)
GLUCOSE: 89 mg/dL (ref 70–99)
POTASSIUM: 3.3 meq/L — AB (ref 3.5–5.1)
Sodium: 140 mEq/L (ref 135–145)
TOTAL PROTEIN: 7.7 g/dL (ref 6.0–8.3)
Total Bilirubin: 0.6 mg/dL (ref 0.2–1.2)

## 2014-08-07 LAB — LIPID PANEL
CHOL/HDL RATIO: 3
CHOLESTEROL: 194 mg/dL (ref 0–200)
HDL: 64.9 mg/dL (ref >39.00)
LDL Cholesterol: 109 mg/dL — ABNORMAL HIGH (ref 0–99)
NonHDL: 129.1
TRIGLYCERIDES: 103 mg/dL (ref 0.0–149.0)
VLDL: 20.6 mg/dL (ref 0.0–40.0)

## 2014-08-07 LAB — CBC WITH DIFFERENTIAL/PLATELET
BASOS ABS: 0 10*3/uL (ref 0.0–0.1)
BASOS PCT: 0.7 % (ref 0.0–3.0)
EOS ABS: 0.2 10*3/uL (ref 0.0–0.7)
Eosinophils Relative: 3.2 % (ref 0.0–5.0)
HCT: 39.3 % (ref 36.0–46.0)
Hemoglobin: 13 g/dL (ref 12.0–15.0)
LYMPHS PCT: 42.8 % (ref 12.0–46.0)
Lymphs Abs: 2.6 10*3/uL (ref 0.7–4.0)
MCHC: 33 g/dL (ref 30.0–36.0)
MCV: 90.2 fl (ref 78.0–100.0)
Monocytes Absolute: 0.4 10*3/uL (ref 0.1–1.0)
Monocytes Relative: 5.8 % (ref 3.0–12.0)
NEUTROS PCT: 47.5 % (ref 43.0–77.0)
Neutro Abs: 2.9 10*3/uL (ref 1.4–7.7)
PLATELETS: 281 10*3/uL (ref 150.0–400.0)
RBC: 4.36 Mil/uL (ref 3.87–5.11)
RDW: 13.6 % (ref 11.5–15.5)
WBC: 6.2 10*3/uL (ref 4.0–10.5)

## 2014-08-07 LAB — TSH: TSH: 1.17 u[IU]/mL (ref 0.35–4.50)

## 2014-08-07 MED ORDER — TRAMADOL HCL 50 MG PO TABS: ORAL_TABLET | ORAL | Status: DC

## 2014-08-07 MED ORDER — HYDROCHLOROTHIAZIDE 25 MG PO TABS: ORAL_TABLET | ORAL | Status: DC

## 2014-08-07 MED ORDER — BENAZEPRIL HCL 40 MG PO TABS: ORAL_TABLET | ORAL | Status: DC

## 2014-08-07 NOTE — Progress Notes (Signed)
Pre visit review using our clinic review tool, if applicable. No additional management support is needed unless otherwise documented below in the visit note. 

## 2014-08-07 NOTE — Patient Instructions (Addendum)
Limit your sodium (Salt) intake    It is important that you exercise regularly, at least 20 minutes 3 to 4 times per week.  If you develop chest pain or shortness of breath seek  medical attention.  Please check your blood pressure on a regular basis.  If it is consistently greater than 150/90, please make an office appointment.  Return in 6 months for follow-up Health Maintenance Adopting a healthy lifestyle and getting preventive care can go a long way to promote health and wellness. Talk with your health care provider about what schedule of regular examinations is right for you. This is a good chance for you to check in with your provider about disease prevention and staying healthy. In between checkups, there are plenty of things you can do on your own. Experts have done a lot of research about which lifestyle changes and preventive measures are most likely to keep you healthy. Ask your health care provider for more information. WEIGHT AND DIET  Eat a healthy diet  Be sure to include plenty of vegetables, fruits, low-fat dairy products, and lean protein.  Do not eat a lot of foods high in solid fats, added sugars, or salt.  Get regular exercise. This is one of the most important things you can do for your health.  Most adults should exercise for at least 150 minutes each week. The exercise should increase your heart rate and make you sweat (moderate-intensity exercise).  Most adults should also do strengthening exercises at least twice a week. This is in addition to the moderate-intensity exercise.  Maintain a healthy weight  Body mass index (BMI) is a measurement that can be used to identify possible weight problems. It estimates body fat based on height and weight. Your health care provider can help determine your BMI and help you achieve or maintain a healthy weight.  For females 30 years of age and older:   A BMI below 18.5 is considered underweight.  A BMI of 18.5 to 24.9 is  normal.  A BMI of 25 to 29.9 is considered overweight.  A BMI of 30 and above is considered obese.  Watch levels of cholesterol and blood lipids  You should start having your blood tested for lipids and cholesterol at 75 years of age, then have this test every 5 years.  You may need to have your cholesterol levels checked more often if:  Your lipid or cholesterol levels are high.  You are older than 75 years of age.  You are at high risk for heart disease.  CANCER SCREENING   Lung Cancer  Lung cancer screening is recommended for adults 37-42 years old who are at high risk for lung cancer because of a history of smoking.  A yearly low-dose CT scan of the lungs is recommended for people who:  Currently smoke.  Have quit within the past 15 years.  Have at least a 30-pack-year history of smoking. A pack year is smoking an average of one pack of cigarettes a day for 1 year.  Yearly screening should continue until it has been 15 years since you quit.  Yearly screening should stop if you develop a health problem that would prevent you from having lung cancer treatment.  Breast Cancer  Practice breast self-awareness. This means understanding how your breasts normally appear and feel.  It also means doing regular breast self-exams. Let your health care provider know about any changes, no matter how small.  If you are in your 48s  or 30s, you should have a clinical breast exam (CBE) by a health care provider every 1-3 years as part of a regular health exam.  If you are 40 or older, have a CBE every year. Also consider having a breast X-ray (mammogram) every year.  If you have a family history of breast cancer, talk to your health care provider about genetic screening.  If you are at high risk for breast cancer, talk to your health care provider about having an MRI and a mammogram every year.  Breast cancer gene (BRCA) assessment is recommended for women who have family members  with BRCA-related cancers. BRCA-related cancers include:  Breast.  Ovarian.  Tubal.  Peritoneal cancers.  Results of the assessment will determine the need for genetic counseling and BRCA1 and BRCA2 testing. Cervical Cancer Routine pelvic examinations to screen for cervical cancer are no longer recommended for nonpregnant women who are considered low risk for cancer of the pelvic organs (ovaries, uterus, and vagina) and who do not have symptoms. A pelvic examination may be necessary if you have symptoms including those associated with pelvic infections. Ask your health care provider if a screening pelvic exam is right for you.   The Pap test is the screening test for cervical cancer for women who are considered at risk.  If you had a hysterectomy for a problem that was not cancer or a condition that could lead to cancer, then you no longer need Pap tests.  If you are older than 65 years, and you have had normal Pap tests for the past 10 years, you no longer need to have Pap tests.  If you have had past treatment for cervical cancer or a condition that could lead to cancer, you need Pap tests and screening for cancer for at least 20 years after your treatment.  If you no longer get a Pap test, assess your risk factors if they change (such as having a new sexual partner). This can affect whether you should start being screened again.  Some women have medical problems that increase their chance of getting cervical cancer. If this is the case for you, your health care provider may recommend more frequent screening and Pap tests.  The human papillomavirus (HPV) test is another test that may be used for cervical cancer screening. The HPV test looks for the virus that can cause cell changes in the cervix. The cells collected during the Pap test can be tested for HPV.  The HPV test can be used to screen women 30 years of age and older. Getting tested for HPV can extend the interval between normal  Pap tests from three to five years.  An HPV test also should be used to screen women of any age who have unclear Pap test results.  After 75 years of age, women should have HPV testing as often as Pap tests.  Colorectal Cancer  This type of cancer can be detected and often prevented.  Routine colorectal cancer screening usually begins at 75 years of age and continues through 75 years of age.  Your health care provider may recommend screening at an earlier age if you have risk factors for colon cancer.  Your health care provider may also recommend using home test kits to check for hidden blood in the stool.  A small camera at the end of a tube can be used to examine your colon directly (sigmoidoscopy or colonoscopy). This is done to check for the earliest forms of colorectal cancer.    Routine screening usually begins at age 50.  Direct examination of the colon should be repeated every 5-10 years through 75 years of age. However, you may need to be screened more often if early forms of precancerous polyps or small growths are found. Skin Cancer  Check your skin from head to toe regularly.  Tell your health care provider about any new moles or changes in moles, especially if there is a change in a mole's shape or color.  Also tell your health care provider if you have a mole that is larger than the size of a pencil eraser.  Always use sunscreen. Apply sunscreen liberally and repeatedly throughout the day.  Protect yourself by wearing long sleeves, pants, a wide-brimmed hat, and sunglasses whenever you are outside. HEART DISEASE, DIABETES, AND HIGH BLOOD PRESSURE   Have your blood pressure checked at least every 1-2 years. High blood pressure causes heart disease and increases the risk of stroke.  If you are between 55 years and 79 years old, ask your health care provider if you should take aspirin to prevent strokes.  Have regular diabetes screenings. This involves taking a blood  sample to check your fasting blood sugar level.  If you are at a normal weight and have a low risk for diabetes, have this test once every three years after 75 years of age.  If you are overweight and have a high risk for diabetes, consider being tested at a younger age or more often. PREVENTING INFECTION  Hepatitis B  If you have a higher risk for hepatitis B, you should be screened for this virus. You are considered at high risk for hepatitis B if:  You were born in a country where hepatitis B is common. Ask your health care provider which countries are considered high risk.  Your parents were born in a high-risk country, and you have not been immunized against hepatitis B (hepatitis B vaccine).  You have HIV or AIDS.  You use needles to inject street drugs.  You live with someone who has hepatitis B.  You have had sex with someone who has hepatitis B.  You get hemodialysis treatment.  You take certain medicines for conditions, including cancer, organ transplantation, and autoimmune conditions. Hepatitis C  Blood testing is recommended for:  Everyone born from 1945 through 1965.  Anyone with known risk factors for hepatitis C. Sexually transmitted infections (STIs)  You should be screened for sexually transmitted infections (STIs) including gonorrhea and chlamydia if:  You are sexually active and are younger than 75 years of age.  You are older than 75 years of age and your health care provider tells you that you are at risk for this type of infection.  Your sexual activity has changed since you were last screened and you are at an increased risk for chlamydia or gonorrhea. Ask your health care provider if you are at risk.  If you do not have HIV, but are at risk, it may be recommended that you take a prescription medicine daily to prevent HIV infection. This is called pre-exposure prophylaxis (PrEP). You are considered at risk if:  You are sexually active and do not  regularly use condoms or know the HIV status of your partner(s).  You take drugs by injection.  You are sexually active with a partner who has HIV. Talk with your health care provider about whether you are at high risk of being infected with HIV. If you choose to begin PrEP, you should first   be tested for HIV. You should then be tested every 3 months for as long as you are taking PrEP.  PREGNANCY   If you are premenopausal and you may become pregnant, ask your health care provider about preconception counseling.  If you may become pregnant, take 400 to 800 micrograms (mcg) of folic acid every day.  If you want to prevent pregnancy, talk to your health care provider about birth control (contraception). OSTEOPOROSIS AND MENOPAUSE   Osteoporosis is a disease in which the bones lose minerals and strength with aging. This can result in serious bone fractures. Your risk for osteoporosis can be identified using a bone density scan.  If you are 65 years of age or older, or if you are at risk for osteoporosis and fractures, ask your health care provider if you should be screened.  Ask your health care provider whether you should take a calcium or vitamin D supplement to lower your risk for osteoporosis.  Menopause may have certain physical symptoms and risks.  Hormone replacement therapy may reduce some of these symptoms and risks. Talk to your health care provider about whether hormone replacement therapy is right for you.  HOME CARE INSTRUCTIONS   Schedule regular health, dental, and eye exams.  Stay current with your immunizations.   Do not use any tobacco products including cigarettes, chewing tobacco, or electronic cigarettes.  If you are pregnant, do not drink alcohol.  If you are breastfeeding, limit how much and how often you drink alcohol.  Limit alcohol intake to no more than 1 drink per day for nonpregnant women. One drink equals 12 ounces of beer, 5 ounces of wine, or 1  ounces of hard liquor.  Do not use street drugs.  Do not share needles.  Ask your health care provider for help if you need support or information about quitting drugs.  Tell your health care provider if you often feel depressed.  Tell your health care provider if you have ever been abused or do not feel safe at home. Document Released: 04/20/2011 Document Revised: 02/19/2014 Document Reviewed: 09/06/2013 [employer] Patient Information 2015 [employer]. This information is not intended to replace advice given to you by your health care provider. Make sure you discuss any questions you have with your health care provider.  

## 2014-08-07 NOTE — Progress Notes (Signed)
Patient ID: Kristy [name], female   DOB: Mar 08, 1941, 73 y.o.   MRN: [id number]  Subjective:    Patient ID: Kristy PinaNancy Vandenbos, female    DOB: Mar 08, 1941, 73 y.o.   MRN: [id number]  HPI 73  year-old patient who is seen today for a preventive health examination.  She does quite well for history of hypertension. She does have a history of allergic rhinitis and asthma that didn't feel well this past print she has gastroesophageal reflux disease. Her most pressing problem is osteoarthritis affecting primarily the small joints of the hands. She uses Celebrex and also tramadol as needed. She is maintained nice blood pressure control.  She had a colonoscopy  2012 She had a mammogram performed last month  Past Medical History  Diagnosis Date  . ALLERGIC RHINITIS 04/07/2007  . ASTHMA 04/07/2007  . COLONIC POLYPS, HX OF 04/07/2007  . GERD 04/07/2007  . HYPERTENSION 04/07/2007  . UNSPECIFIED DISORDER TEETH&SUPPORTING STRUCTURES 09/09/2010    History   Social History  . Marital Status: Married    Spouse Name: N/A    Number of Children: N/A  . Years of Education: N/A   Occupational History  . Not on file.   Social History Main Topics  . Smoking status: Former Smoker    Quit date: 10/19/1988  . Smokeless tobacco: Not on file  . Alcohol Use: 0.0 oz/week  . Drug Use: Not on file  . Sexual Activity: Not on file   Other Topics Concern  . Not on file   Social History Narrative  . No narrative on file    Past Surgical History  Procedure Laterality Date  . Cholecystectomy    . Abdominal hysterectomy    . Laminectomy      No family history on file.  Allergies  Allergen Reactions  . Aspirin Itching  . Clonidine Hives    Current Outpatient Prescriptions on File Prior to Visit  Medication Sig Dispense Refill  . albuterol (PROVENTIL HFA;VENTOLIN HFA) 108 (90 BASE) MCG/ACT inhaler Inhale 2 puffs into the lungs every 6 (six) hours as needed for wheezing.      . benazepril (LOTENSIN) 40 MG tablet  TAKE 1 TABLET BY MOUTH EVERY DAY  90 tablet  3  . celecoxib (CELEBREX) 200 MG capsule TAKE 1 CAPSULE (200 MG TOTAL) BY MOUTH DAILY.  90 capsule  0  . celecoxib (CELEBREX) 200 MG capsule TAKE 1 CAPSULE (200 MG TOTAL) BY MOUTH DAILY.  90 capsule  3  . Fexofenadine HCl (ALLEGRA PO) Take 1 tablet by mouth daily.       . Fluticasone-Salmeterol (ADVAIR HFA IN) Inhale 1 puff into the lungs as needed.       . hydrochlorothiazide (HYDRODIURIL) 25 MG tablet TAKE 1 TABLET BY MOUTH EVERY DAY  90 tablet  0  . trolamine salicylate (ASPERCREME) 10 % cream Apply topically as needed. pain        No current facility-administered medications on file prior to visit.    BP 150/86  Pulse 76  Temp(Src) 98.2 F (36.8 C) (Oral)  Resp 20  Ht 5' 0.75" (1.543 m)  Wt 162 lb (73.483 kg)  BMI 30.86 kg/m2  SpO2 97%  1. Risk factors, based on past  M,S,F history-   cardiovascular risk factors include a history of hypertension  2.  Physical activities: Remains quite active walks daily and bowls frequently does care for her disabled husband who has pulmonary hypertension and is on chronic oxygen therapy  3.  Depression/mood: No history  depression or mood disorder  4.  Hearing: No deficits  5.  ADL's: Independent in all aspects of daily living  6.  Fall risk: Low  7.  Home safety: No problems identified  8.  Height weight, and visual acuity; height and weight stable no change in visual acuity  9.  Counseling: Heart healthy diet regular exercise all encouraged  10. Lab orders based on risk factors: Laboratory profile including lipid panel will be reviewed  11. Referral : Not appropriate at this time  12. Care plan: Heart healthy diet regular exercise encouraged. Home blood pressure monitoring encouraged 13. Cognitive assessment: Alert and appropriate with normal affect no cognitive dysfunction 14.  Preventive services will include an annual mammogram and health maintenance evaluation with screening lab.   Patient was provided with a written and personalized care plan 15.  Provider list includes ophthalmology radiology primary care.  She also sees GI at five-year intervals do to a history of colonic polyps         Review of Systems  Constitutional: Negative for fever, appetite change, fatigue and unexpected weight change.  HENT: Negative for congestion, dental problem, ear pain, hearing loss, mouth sores, nosebleeds, sinus pressure, sore throat, tinnitus, trouble swallowing and voice change.   Eyes: Negative for photophobia, pain, redness and visual disturbance.  Respiratory: Negative for cough, chest tightness and shortness of breath.   Cardiovascular: Negative for chest pain, palpitations and leg swelling.  Gastrointestinal: Negative for nausea, vomiting, abdominal pain, diarrhea, constipation, blood in stool, abdominal distention and rectal pain.  Genitourinary: Negative for dysuria, urgency, frequency, hematuria, flank pain, vaginal bleeding, vaginal discharge, difficulty urinating, genital sores, vaginal pain, menstrual problem and pelvic pain.  Musculoskeletal: Positive for arthralgias and joint swelling. Negative for back pain and neck stiffness.  Skin: Negative for rash.  Neurological: Negative for dizziness, syncope, speech difficulty, weakness, light-headedness, numbness and headaches.  Hematological: Negative for adenopathy. Does not bruise/bleed easily.  Psychiatric/Behavioral: Negative for suicidal ideas, behavioral problems, self-injury, dysphoric mood and agitation. The patient is not nervous/anxious.        Objective:   Physical Exam  Constitutional: She is oriented to person, place, and time. She appears well-developed and well-nourished.  Weight 159. Blood pressure  140/80  HENT:  Head: Normocephalic and atraumatic.  Right Ear: External ear normal.  Left Ear: External ear normal.  Mouth/Throat: Oropharynx is clear and moist.  Eyes: Conjunctivae and EOM are normal.   Neck: Normal range of motion. Neck supple. No JVD present. No thyromegaly present.  Cardiovascular: Normal rate, regular rhythm, normal heart sounds and intact distal pulses.   No murmur heard. Pulmonary/Chest: Effort normal. She has no wheezes. She has rales.  Few crackles, right base  Abdominal: Soft. Bowel sounds are normal. She exhibits no distension and no mass. There is no tenderness. There is no rebound and no guarding.  Right upper quadrant scar  Genitourinary: Vagina normal.  Musculoskeletal: Normal range of motion. She exhibits no edema and no tenderness.  Neurological: She is alert and oriented to person, place, and time. She has normal reflexes. No cranial nerve deficit. She exhibits normal muscle tone. Coordination normal.  Skin: Skin is warm and dry. No rash noted.  Psychiatric: She has a normal mood and affect. Her behavior is normal.          Assessment & Plan:   Preventive health examination Hypertension stable we'll continue present regimen we'll continue low-salt diet and exercise program modest weight loss encouraged Asthma/allergic rhinitis stable Osteoarthritis  Recheck 6 months and in her and in and and

## 2014-08-22 ENCOUNTER — Other Ambulatory Visit: Payer: Self-pay | Admitting: Rheumatology

## 2014-08-22 DIAGNOSIS — M1990 Unspecified osteoarthritis, unspecified site: Secondary | ICD-10-CM | POA: Diagnosis not present

## 2014-08-22 DIAGNOSIS — M2550 Pain in unspecified joint: Secondary | ICD-10-CM | POA: Diagnosis not present

## 2014-08-22 DIAGNOSIS — R5383 Other fatigue: Secondary | ICD-10-CM | POA: Diagnosis not present

## 2014-08-22 DIAGNOSIS — M899 Disorder of bone, unspecified: Secondary | ICD-10-CM | POA: Diagnosis not present

## 2014-08-22 DIAGNOSIS — M154 Erosive (osteo)arthritis: Secondary | ICD-10-CM | POA: Diagnosis not present

## 2014-08-23 ENCOUNTER — Ambulatory Visit
Admission: RE | Admit: 2014-08-23 | Discharge: 2014-08-23 | Disposition: A | Discharge status: Home / Self Care | Payer: Medicare Other | Source: Ambulatory Visit | Attending: Rheumatology | Admitting: Rheumatology

## 2014-08-23 DIAGNOSIS — M899 Disorder of bone, unspecified: Secondary | ICD-10-CM

## 2014-08-23 DIAGNOSIS — R079 Chest pain, unspecified: Secondary | ICD-10-CM | POA: Diagnosis not present

## 2014-08-30 ENCOUNTER — Other Ambulatory Visit: Payer: Self-pay | Admitting: Rheumatology

## 2014-08-30 DIAGNOSIS — M25512 Pain in left shoulder: Secondary | ICD-10-CM

## 2014-09-19 DIAGNOSIS — Z791 Long term (current) use of non-steroidal anti-inflammatories (NSAID): Secondary | ICD-10-CM | POA: Diagnosis not present

## 2014-09-19 DIAGNOSIS — M1990 Unspecified osteoarthritis, unspecified site: Secondary | ICD-10-CM | POA: Diagnosis not present

## 2014-09-19 DIAGNOSIS — M898X1 Other specified disorders of bone, shoulder: Secondary | ICD-10-CM | POA: Diagnosis not present

## 2014-09-19 DIAGNOSIS — M154 Erosive (osteo)arthritis: Secondary | ICD-10-CM | POA: Diagnosis not present

## 2015-01-03 DIAGNOSIS — S92514A Nondisplaced fracture of proximal phalanx of right lesser toe(s), initial encounter for closed fracture: Secondary | ICD-10-CM | POA: Diagnosis not present

## 2015-01-03 DIAGNOSIS — M79674 Pain in right toe(s): Secondary | ICD-10-CM | POA: Diagnosis not present

## 2015-01-03 DIAGNOSIS — M25512 Pain in left shoulder: Secondary | ICD-10-CM | POA: Diagnosis not present

## 2015-01-17 DIAGNOSIS — M25511 Pain in right shoulder: Secondary | ICD-10-CM | POA: Diagnosis not present

## 2015-01-17 DIAGNOSIS — S92514D Nondisplaced fracture of proximal phalanx of right lesser toe(s), subsequent encounter for fracture with routine healing: Secondary | ICD-10-CM | POA: Diagnosis not present

## 2015-01-18 HISTORY — PX: ROTATOR CUFF REPAIR: SHX139

## 2015-01-24 DIAGNOSIS — M25512 Pain in left shoulder: Secondary | ICD-10-CM | POA: Diagnosis not present

## 2015-01-31 DIAGNOSIS — S46019A Strain of muscle(s) and tendon(s) of the rotator cuff of unspecified shoulder, initial encounter: Secondary | ICD-10-CM | POA: Diagnosis not present

## 2015-02-06 ENCOUNTER — Ambulatory Visit: Payer: Medicare Other | Admitting: Internal Medicine

## 2015-02-06 DIAGNOSIS — G8918 Other acute postprocedural pain: Secondary | ICD-10-CM | POA: Diagnosis not present

## 2015-02-06 DIAGNOSIS — M7542 Impingement syndrome of left shoulder: Secondary | ICD-10-CM | POA: Diagnosis not present

## 2015-02-06 DIAGNOSIS — M75122 Complete rotator cuff tear or rupture of left shoulder, not specified as traumatic: Secondary | ICD-10-CM | POA: Diagnosis not present

## 2015-02-06 DIAGNOSIS — S46012A Strain of muscle(s) and tendon(s) of the rotator cuff of left shoulder, initial encounter: Secondary | ICD-10-CM | POA: Diagnosis not present

## 2015-02-11 ENCOUNTER — Ambulatory Visit: Payer: Medicare Other | Admitting: Internal Medicine

## 2015-02-14 DIAGNOSIS — S46012D Strain of muscle(s) and tendon(s) of the rotator cuff of left shoulder, subsequent encounter: Secondary | ICD-10-CM | POA: Diagnosis not present

## 2015-02-21 ENCOUNTER — Ambulatory Visit: Payer: Medicare Other | Admitting: Internal Medicine

## 2015-02-21 DIAGNOSIS — M7542 Impingement syndrome of left shoulder: Secondary | ICD-10-CM | POA: Diagnosis not present

## 2015-02-21 DIAGNOSIS — M545 Low back pain: Secondary | ICD-10-CM | POA: Diagnosis not present

## 2015-02-21 DIAGNOSIS — S46012D Strain of muscle(s) and tendon(s) of the rotator cuff of left shoulder, subsequent encounter: Secondary | ICD-10-CM | POA: Diagnosis not present

## 2015-02-21 DIAGNOSIS — Z4789 Encounter for other orthopedic aftercare: Secondary | ICD-10-CM | POA: Diagnosis not present

## 2015-03-12 DIAGNOSIS — S46012D Strain of muscle(s) and tendon(s) of the rotator cuff of left shoulder, subsequent encounter: Secondary | ICD-10-CM | POA: Diagnosis not present

## 2015-03-12 DIAGNOSIS — Z4789 Encounter for other orthopedic aftercare: Secondary | ICD-10-CM | POA: Diagnosis not present

## 2015-03-14 ENCOUNTER — Ambulatory Visit: Payer: Medicare Other | Admitting: Internal Medicine

## 2015-03-26 DIAGNOSIS — S46012D Strain of muscle(s) and tendon(s) of the rotator cuff of left shoulder, subsequent encounter: Secondary | ICD-10-CM | POA: Diagnosis not present

## 2015-03-26 DIAGNOSIS — Z4789 Encounter for other orthopedic aftercare: Secondary | ICD-10-CM | POA: Diagnosis not present

## 2015-03-28 DIAGNOSIS — S46012D Strain of muscle(s) and tendon(s) of the rotator cuff of left shoulder, subsequent encounter: Secondary | ICD-10-CM | POA: Diagnosis not present

## 2015-04-01 DIAGNOSIS — S46012D Strain of muscle(s) and tendon(s) of the rotator cuff of left shoulder, subsequent encounter: Secondary | ICD-10-CM | POA: Diagnosis not present

## 2015-04-04 ENCOUNTER — Ambulatory Visit (INDEPENDENT_AMBULATORY_CARE_PROVIDER_SITE_OTHER): Payer: Medicare Other | Admitting: Internal Medicine

## 2015-04-04 ENCOUNTER — Encounter: Payer: Self-pay | Admitting: Internal Medicine

## 2015-04-04 VITALS — BP 140/90 | HR 82 | Temp 98.6°F | Resp 20 | Ht 60.75 in | Wt 159.0 lb

## 2015-04-04 DIAGNOSIS — J069 Acute upper respiratory infection, unspecified: Secondary | ICD-10-CM | POA: Diagnosis not present

## 2015-04-04 DIAGNOSIS — I10 Essential (primary) hypertension: Secondary | ICD-10-CM

## 2015-04-04 DIAGNOSIS — J4520 Mild intermittent asthma, uncomplicated: Secondary | ICD-10-CM

## 2015-04-04 DIAGNOSIS — S46012D Strain of muscle(s) and tendon(s) of the rotator cuff of left shoulder, subsequent encounter: Secondary | ICD-10-CM | POA: Diagnosis not present

## 2015-04-04 DIAGNOSIS — B9789 Other viral agents as the cause of diseases classified elsewhere: Secondary | ICD-10-CM

## 2015-04-04 MED ORDER — HYDROCODONE-ACETAMINOPHEN 5-325 MG PO TABS: 1.0000 | ORAL_TABLET | Freq: Four times a day (QID) | ORAL | Status: DC | PRN

## 2015-04-04 NOTE — Progress Notes (Signed)
Pre visit review using our clinic review tool, if applicable. No additional management support is needed unless otherwise documented below in the visit note. 

## 2015-04-04 NOTE — Progress Notes (Signed)
Subjective:    Patient ID: Kristy [name], female    DOB: 24-Jul-1941, 74 y.o.   MRN: [id number]  HPI  74 year old patient who has a history of essential hypertension and asthma. She is status post surgery for a left rotator cuff tear.  2 months ago.  For the past several days she has had cough, sore throat, congestion and headache.  There is been no fever.  Past Medical History  Diagnosis Date  . ALLERGIC RHINITIS 04/07/2007  . ASTHMA 04/07/2007  . COLONIC POLYPS, HX OF 04/07/2007  . GERD 04/07/2007  . HYPERTENSION 04/07/2007  . UNSPECIFIED DISORDER TEETH&SUPPORTING STRUCTURES 09/09/2010    History   Social History  . Marital Status: Married    Spouse Name: N/A  . Number of Children: N/A  . Years of Education: N/A   Occupational History  . Not on file.   Social History Main Topics  . Smoking status: Former Smoker    Quit date: 10/19/1988  . Smokeless tobacco: Not on file  . Alcohol Use: 0.0 oz/week  . Drug Use: Not on file  . Sexual Activity: Not on file   Other Topics Concern  . Not on file   Social History Narrative    Past Surgical History  Procedure Laterality Date  . Cholecystectomy    . Abdominal hysterectomy    . Laminectomy      No family history on file.  Allergies  Allergen Reactions  . Aspirin Itching  . Clonidine Hives    Current Outpatient Prescriptions on File Prior to Visit  Medication Sig Dispense Refill  . albuterol (PROVENTIL HFA;VENTOLIN HFA) 108 (90 BASE) MCG/ACT inhaler Inhale 2 puffs into the lungs every 6 (six) hours as needed for wheezing.    . benazepril (LOTENSIN) 40 MG tablet TAKE 1 TABLET BY MOUTH EVERY DAY 90 tablet 3  . celecoxib (CELEBREX) 200 MG capsule TAKE 1 CAPSULE (200 MG TOTAL) BY MOUTH DAILY. 90 capsule 3  . Fexofenadine HCl (ALLEGRA PO) Take 1 tablet by mouth daily.     . Fluticasone-Salmeterol (ADVAIR HFA IN) Inhale 1 puff into the lungs as needed.     . hydrochlorothiazide (HYDRODIURIL) 25 MG tablet TAKE 1 TABLET BY  MOUTH EVERY DAY 90 tablet 3  . traMADol (ULTRAM) 50 MG tablet TAKE 1 TABLET EVERY 6 HOURS AS NEEDED FOR PAIN 90 tablet 5  . trolamine salicylate (ASPERCREME) 10 % cream Apply topically as needed. pain      No current facility-administered medications on file prior to visit.    BP 140/90 mmHg  Pulse 82  Temp(Src) 98.6 F (37 C) (Oral)  Resp 20  Ht 5' 0.75" (1.543 m)  Wt 159 lb (72.122 kg)  BMI 30.29 kg/m2  SpO2 95%     Review of Systems  Constitutional: Positive for activity change, appetite change and fatigue.  HENT: Positive for congestion, postnasal drip and rhinorrhea. Negative for dental problem, hearing loss, sinus pressure, sore throat and tinnitus.   Eyes: Negative for pain, discharge and visual disturbance.  Respiratory: Positive for cough. Negative for shortness of breath.   Cardiovascular: Negative for chest pain, palpitations and leg swelling.  Gastrointestinal: Negative for nausea, vomiting, abdominal pain, diarrhea, constipation, blood in stool and abdominal distention.  Genitourinary: Negative for dysuria, urgency, frequency, hematuria, flank pain, vaginal bleeding, vaginal discharge, difficulty urinating, vaginal pain and pelvic pain.  Musculoskeletal: Negative for joint swelling, arthralgias and gait problem.  Skin: Negative for rash.  Neurological: Negative for dizziness, syncope, speech difficulty, weakness,  numbness and headaches.  Hematological: Negative for adenopathy.  Psychiatric/Behavioral: Negative for behavioral problems, dysphoric mood and agitation. The patient is not nervous/anxious.        Objective:   Physical Exam  Constitutional: She is oriented to person, place, and time. She appears well-developed and well-nourished.  HENT:  Head: Normocephalic.  Right Ear: External ear normal.  Left Ear: External ear normal.  Mouth/Throat: Oropharynx is clear and moist.  Eyes: Conjunctivae and EOM are normal. Pupils are equal, round, and reactive to  light.  Neck: Normal range of motion. Neck supple. No thyromegaly present.  Cardiovascular: Normal rate, regular rhythm, normal heart sounds and intact distal pulses.   Pulmonary/Chest: Effort normal and breath sounds normal.  Abdominal: Soft. Bowel sounds are normal. She exhibits no mass. There is no tenderness.  Musculoskeletal: Normal range of motion.  Lymphadenopathy:    She has no cervical adenopathy.  Neurological: She is alert and oriented to person, place, and time.  Skin: Skin is warm and dry. No rash noted.  Psychiatric: She has a normal mood and affect. Her behavior is normal.          Assessment & Plan:   Viral URI with cough Essential hypertension, stable  We'll treat symptomatically Patient will report any new or worsening symptoms

## 2015-04-04 NOTE — Patient Instructions (Signed)
Acute bronchitis symptoms for less than 10 days are generally not helped by antibiotics.  Take over-the-counter expectorants and cough medications such as  Mucinex DM.  Call if there is no improvement in 5 to 7 days or if  you develop worsening cough, fever, or new symptoms, such as shortness of breath or chest pain.  HOME CARE INSTRUCTIONS  Get plenty of rest.  Drink enough fluids to keep your urine clear or pale yellow (unless you have a medical condition that requires fluid restriction). Increasing fluids may help thin your respiratory secretions (sputum) and reduce chest congestion, and it will prevent dehydration.  Take medicines only as directed by your health care provider.  If you were prescribed an antibiotic medicine, finish it all even if you start to feel better.  Avoid smoking and secondhand smoke. Exposure to cigarette smoke or irritating chemicals will make bronchitis worse. If you are a smoker, consider using nicotine gum or skin patches to help control withdrawal symptoms. Quitting smoking will help your lungs heal faster.  Reduce the chances of another bout of acute bronchitis by washing your hands frequently, avoiding people with cold symptoms, and trying not to touch your hands to your mouth, nose, or eyes.   

## 2015-04-08 DIAGNOSIS — S46012D Strain of muscle(s) and tendon(s) of the rotator cuff of left shoulder, subsequent encounter: Secondary | ICD-10-CM | POA: Diagnosis not present

## 2015-04-09 DIAGNOSIS — Z4789 Encounter for other orthopedic aftercare: Secondary | ICD-10-CM | POA: Diagnosis not present

## 2015-04-09 DIAGNOSIS — S46012D Strain of muscle(s) and tendon(s) of the rotator cuff of left shoulder, subsequent encounter: Secondary | ICD-10-CM | POA: Diagnosis not present

## 2015-04-12 DIAGNOSIS — S46012D Strain of muscle(s) and tendon(s) of the rotator cuff of left shoulder, subsequent encounter: Secondary | ICD-10-CM | POA: Diagnosis not present

## 2015-04-15 ENCOUNTER — Other Ambulatory Visit: Payer: Self-pay

## 2015-04-15 DIAGNOSIS — S46012D Strain of muscle(s) and tendon(s) of the rotator cuff of left shoulder, subsequent encounter: Secondary | ICD-10-CM | POA: Diagnosis not present

## 2015-04-17 DIAGNOSIS — S46012D Strain of muscle(s) and tendon(s) of the rotator cuff of left shoulder, subsequent encounter: Secondary | ICD-10-CM | POA: Diagnosis not present

## 2015-04-23 DIAGNOSIS — S46012D Strain of muscle(s) and tendon(s) of the rotator cuff of left shoulder, subsequent encounter: Secondary | ICD-10-CM | POA: Diagnosis not present

## 2015-04-26 DIAGNOSIS — S46012D Strain of muscle(s) and tendon(s) of the rotator cuff of left shoulder, subsequent encounter: Secondary | ICD-10-CM | POA: Diagnosis not present

## 2015-04-30 DIAGNOSIS — S46012D Strain of muscle(s) and tendon(s) of the rotator cuff of left shoulder, subsequent encounter: Secondary | ICD-10-CM | POA: Diagnosis not present

## 2015-05-02 DIAGNOSIS — S46012D Strain of muscle(s) and tendon(s) of the rotator cuff of left shoulder, subsequent encounter: Secondary | ICD-10-CM | POA: Diagnosis not present

## 2015-05-03 DIAGNOSIS — Z1231 Encounter for screening mammogram for malignant neoplasm of breast: Secondary | ICD-10-CM | POA: Diagnosis not present

## 2015-05-03 LAB — HM MAMMOGRAPHY

## 2015-05-06 DIAGNOSIS — S46012D Strain of muscle(s) and tendon(s) of the rotator cuff of left shoulder, subsequent encounter: Secondary | ICD-10-CM | POA: Diagnosis not present

## 2015-05-07 ENCOUNTER — Encounter: Payer: Self-pay | Admitting: Internal Medicine

## 2015-05-07 DIAGNOSIS — Z4789 Encounter for other orthopedic aftercare: Secondary | ICD-10-CM | POA: Diagnosis not present

## 2015-05-07 DIAGNOSIS — M25511 Pain in right shoulder: Secondary | ICD-10-CM | POA: Diagnosis not present

## 2015-05-10 DIAGNOSIS — S46012D Strain of muscle(s) and tendon(s) of the rotator cuff of left shoulder, subsequent encounter: Secondary | ICD-10-CM | POA: Diagnosis not present

## 2015-05-15 DIAGNOSIS — S46012D Strain of muscle(s) and tendon(s) of the rotator cuff of left shoulder, subsequent encounter: Secondary | ICD-10-CM | POA: Diagnosis not present

## 2015-05-17 DIAGNOSIS — S46012D Strain of muscle(s) and tendon(s) of the rotator cuff of left shoulder, subsequent encounter: Secondary | ICD-10-CM | POA: Diagnosis not present

## 2015-05-21 DIAGNOSIS — S46012D Strain of muscle(s) and tendon(s) of the rotator cuff of left shoulder, subsequent encounter: Secondary | ICD-10-CM | POA: Diagnosis not present

## 2015-05-23 DIAGNOSIS — S46012D Strain of muscle(s) and tendon(s) of the rotator cuff of left shoulder, subsequent encounter: Secondary | ICD-10-CM | POA: Diagnosis not present

## 2015-05-28 DIAGNOSIS — S46012D Strain of muscle(s) and tendon(s) of the rotator cuff of left shoulder, subsequent encounter: Secondary | ICD-10-CM | POA: Diagnosis not present

## 2015-05-31 DIAGNOSIS — S46012D Strain of muscle(s) and tendon(s) of the rotator cuff of left shoulder, subsequent encounter: Secondary | ICD-10-CM | POA: Diagnosis not present

## 2015-06-04 DIAGNOSIS — S46012D Strain of muscle(s) and tendon(s) of the rotator cuff of left shoulder, subsequent encounter: Secondary | ICD-10-CM | POA: Diagnosis not present

## 2015-06-07 DIAGNOSIS — S46012D Strain of muscle(s) and tendon(s) of the rotator cuff of left shoulder, subsequent encounter: Secondary | ICD-10-CM | POA: Diagnosis not present

## 2015-06-11 DIAGNOSIS — S46012D Strain of muscle(s) and tendon(s) of the rotator cuff of left shoulder, subsequent encounter: Secondary | ICD-10-CM | POA: Diagnosis not present

## 2015-06-18 ENCOUNTER — Other Ambulatory Visit: Payer: Self-pay | Admitting: Internal Medicine

## 2015-07-02 IMAGING — CT CT CHEST LIMITED W/O CM
1 of 2 series · 16 of 30 positions shown, 20 images · non-contrast
Comparison: Chest x-ray of 01/03/2013

CLINICAL DATA: Pain in the left sternoclavicular joint for 2 months

EXAM:
CT CHEST WITHOUT CONTRAST
TECHNIQUE: Multidetector CT imaging of the chest was performed following the
standard protocol without IV contrast..

[Series 400: sag · sagittal · 0.61mm/px · 16 of 155 slices shown, 20 images]
[im 8/155  mediastinal]
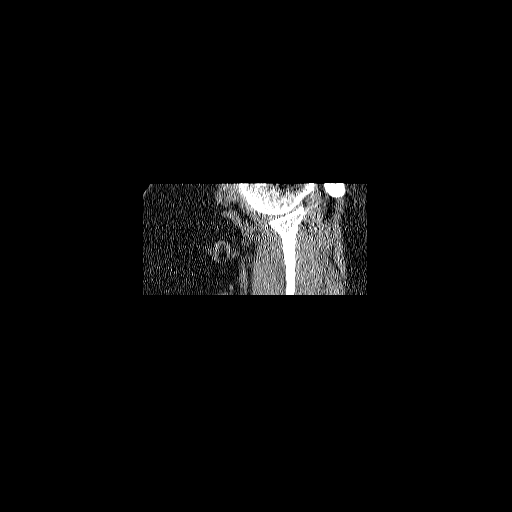
[im 8/155  lung]
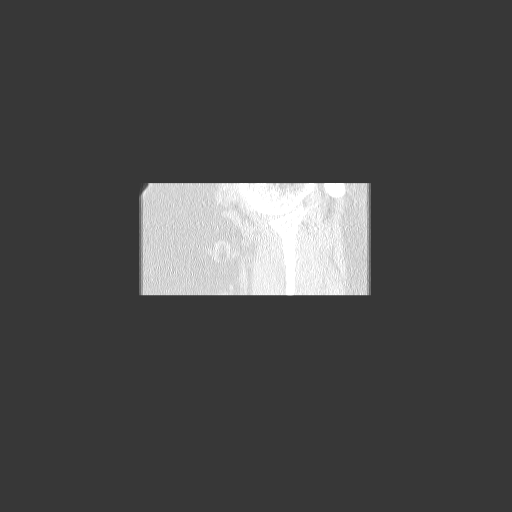
[im 16/155  lung]
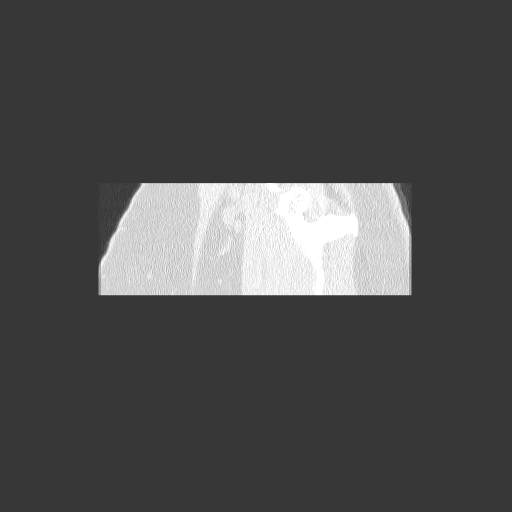
[im 31/155  lung]
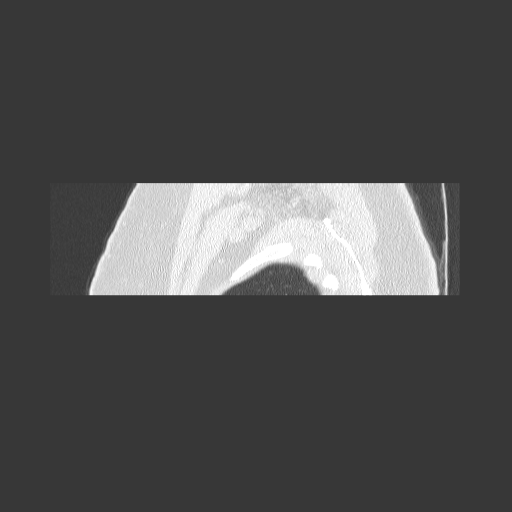
[im 39/155  lung]
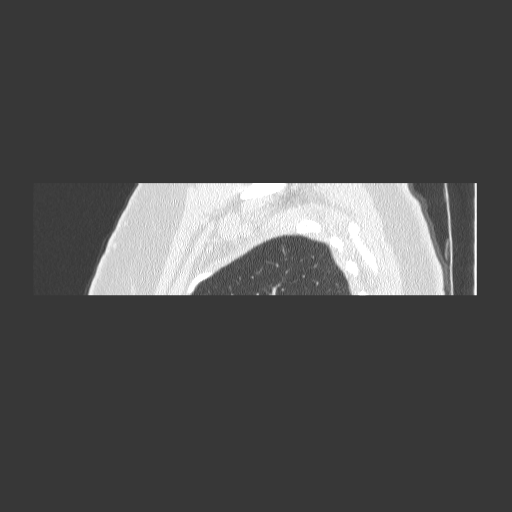
[im 47/155  mediastinal]
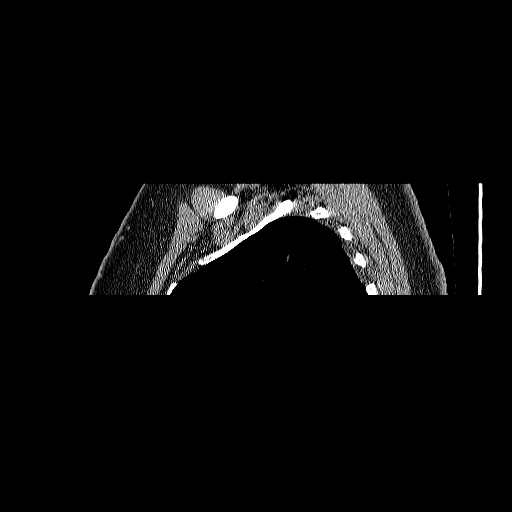
[im 47/155  lung]
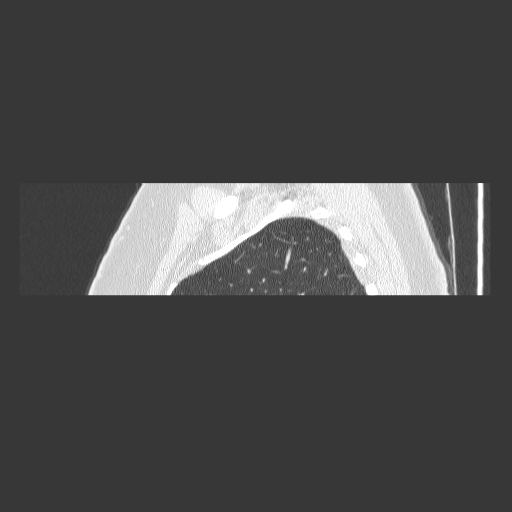
[im 54/155  lung]
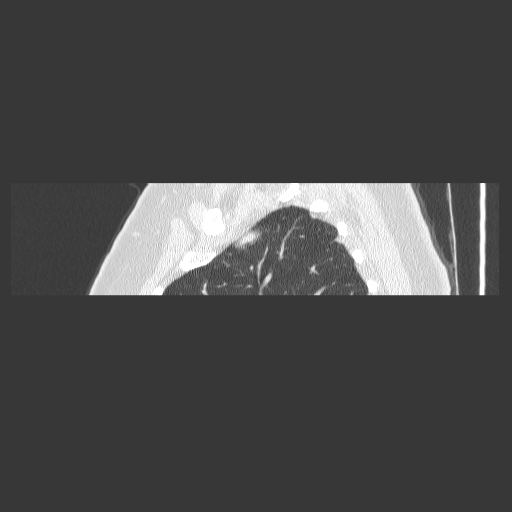
[im 62/155  lung]
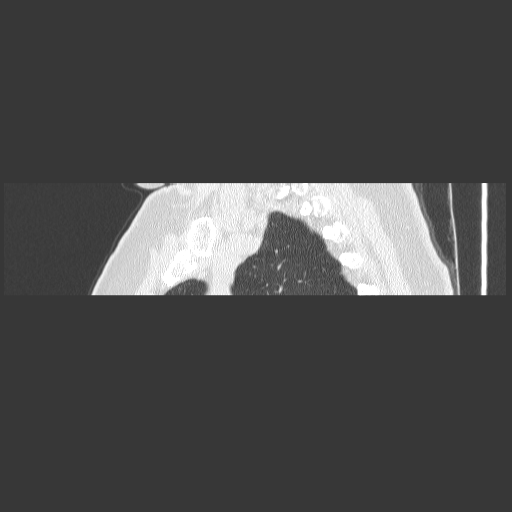
[im 70/155  lung]
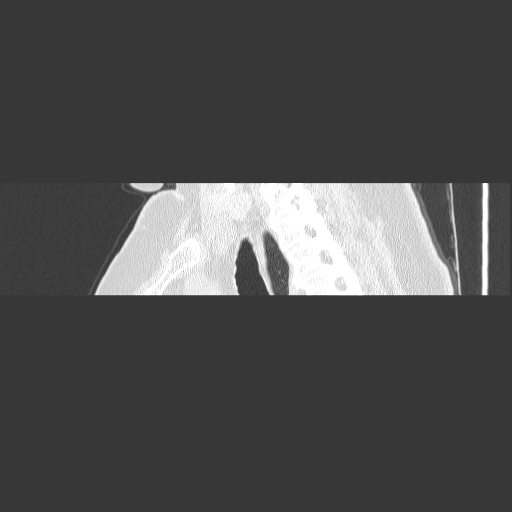
[im 85/155  mediastinal]
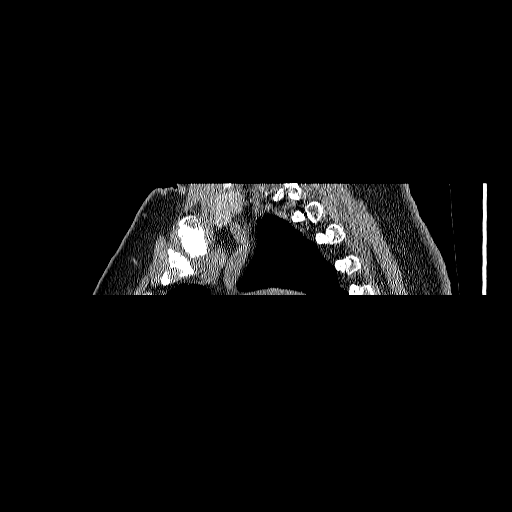
[im 85/155  lung]
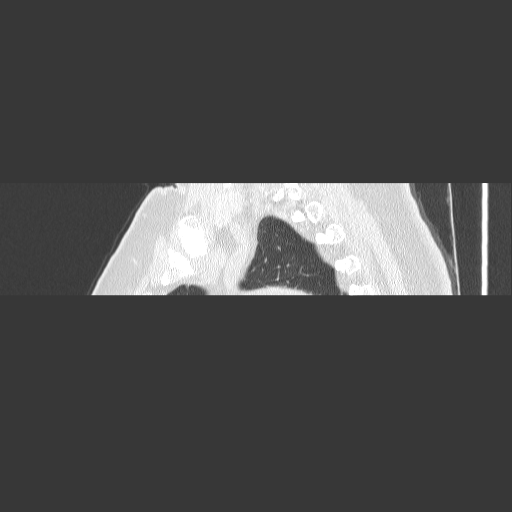
[im 93/155  lung]
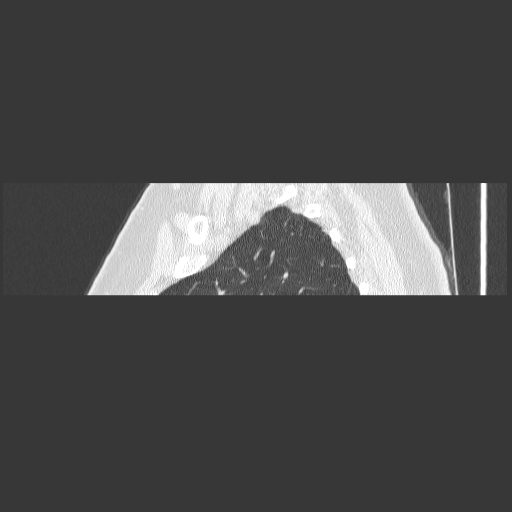
[im 101/155  lung]
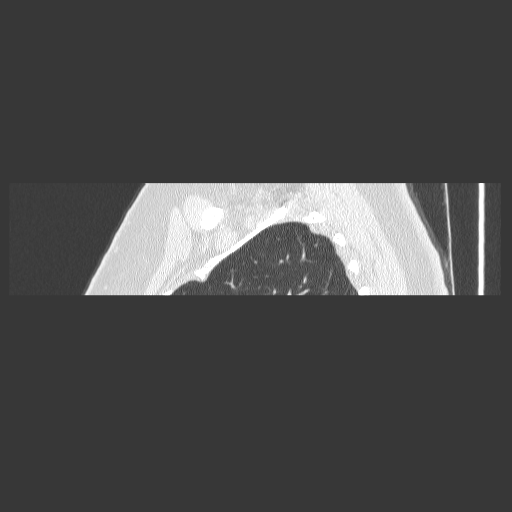
[im 108/155  lung]
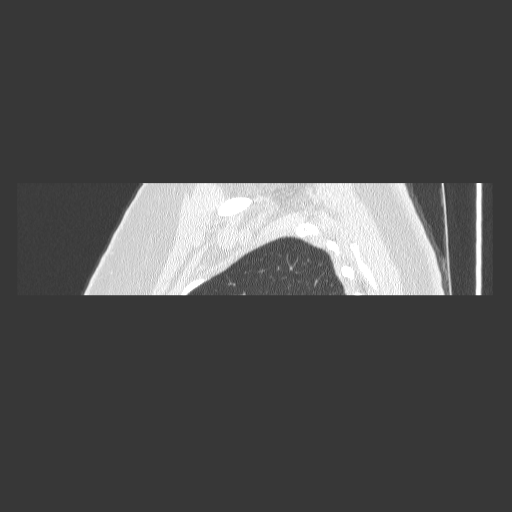
[im 116/155  mediastinal]
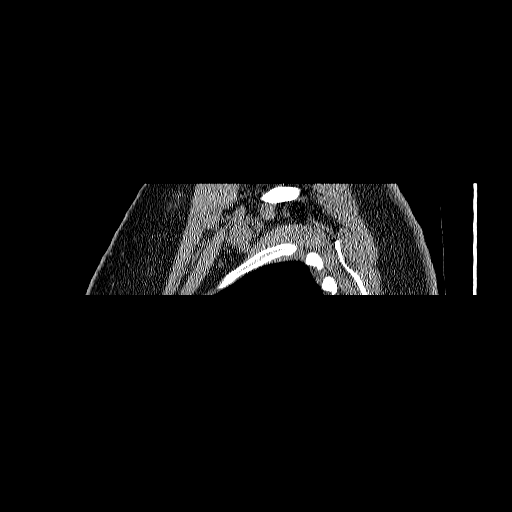
[im 116/155  lung]
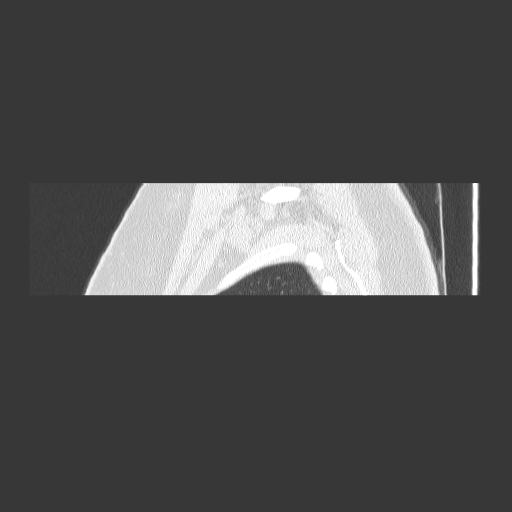
[im 124/155  lung]
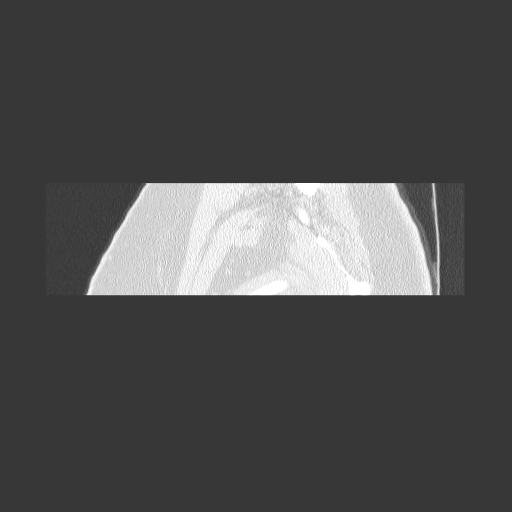
[im 139/155  lung]
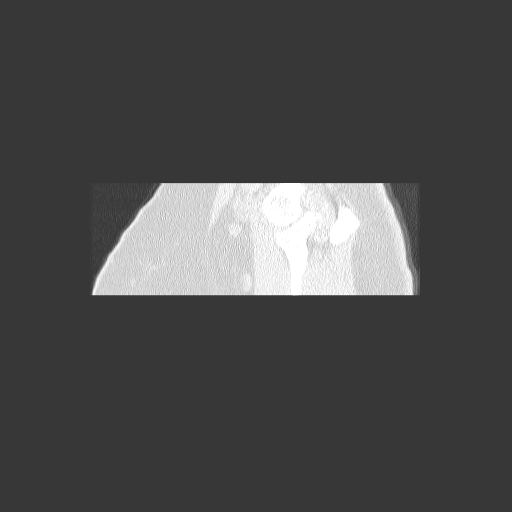
[im 147/155  lung]
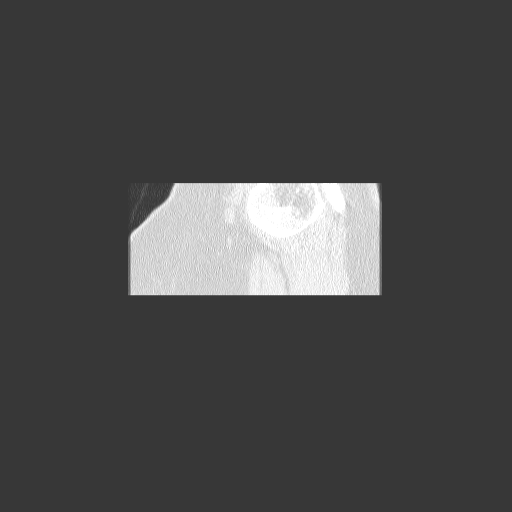

[16 of 30 positions shown; findings below may reference images not displayed]

FINDINGS: There is degenerative joint disease primarily involving the left
sternoclavicular joint. There is sclerosis of the left clavicular
head with spur formation and subchondral cyst formation also
affecting the sternum. No bony erosion or destruction is seen. No
rib abnormality is evident. The thyroid gland is normal in size. No
axillary adenopathy is seen. The lung apices are clear.
IMPRESSION: Degenerative joint disease involving the left sternoclavicular joint
with sclerosis, spurring, and subchondral cyst formation.

## 2015-07-09 DIAGNOSIS — S46012D Strain of muscle(s) and tendon(s) of the rotator cuff of left shoulder, subsequent encounter: Secondary | ICD-10-CM | POA: Diagnosis not present

## 2015-07-09 DIAGNOSIS — Z4789 Encounter for other orthopedic aftercare: Secondary | ICD-10-CM | POA: Diagnosis not present

## 2015-08-28 ENCOUNTER — Telehealth: Payer: Self-pay | Admitting: Internal Medicine

## 2015-08-28 DIAGNOSIS — M898X1 Other specified disorders of bone, shoulder: Secondary | ICD-10-CM | POA: Diagnosis not present

## 2015-08-28 DIAGNOSIS — Z79899 Other long term (current) drug therapy: Secondary | ICD-10-CM | POA: Diagnosis not present

## 2015-08-28 DIAGNOSIS — M154 Erosive (osteo)arthritis: Secondary | ICD-10-CM | POA: Diagnosis not present

## 2015-08-28 DIAGNOSIS — M150 Primary generalized (osteo)arthritis: Secondary | ICD-10-CM | POA: Diagnosis not present

## 2015-08-28 MED ORDER — ALBUTEROL SULFATE HFA 108 (90 BASE) MCG/ACT IN AERS: 2.0000 | INHALATION_SPRAY | Freq: Four times a day (QID) | RESPIRATORY_TRACT | Status: DC | PRN

## 2015-08-28 MED ORDER — FLUTICASONE-SALMETEROL 250-50 MCG/DOSE IN AEPB: 1.0000 | INHALATION_SPRAY | Freq: Two times a day (BID) | RESPIRATORY_TRACT | Status: DC

## 2015-08-28 NOTE — Telephone Encounter (Signed)
Left message Rx's sent to pharmacy as requested. 

## 2015-08-28 NOTE — Telephone Encounter (Signed)
Pt request refill   Fluticasone-Salmeterol (ADVAIR HFA IN) albuterol (PROVENTIL HFA;VENTOLIN HFA) 108 (90 BASE) MCG/ACT inhaler  [facility] Pt is out and needs asap  Pt has 6 mo follow up appointment on 10/04/15

## 2015-09-13 ENCOUNTER — Other Ambulatory Visit: Payer: Self-pay | Admitting: Internal Medicine

## 2015-10-04 ENCOUNTER — Ambulatory Visit (INDEPENDENT_AMBULATORY_CARE_PROVIDER_SITE_OTHER): Payer: Medicare Other | Admitting: Internal Medicine

## 2015-10-04 ENCOUNTER — Encounter: Payer: Self-pay | Admitting: Internal Medicine

## 2015-10-04 VITALS — BP 140/80 | HR 81 | Temp 98.6°F | Ht 60.0 in | Wt 168.0 lb

## 2015-10-04 DIAGNOSIS — M150 Primary generalized (osteo)arthritis: Secondary | ICD-10-CM

## 2015-10-04 DIAGNOSIS — I10 Essential (primary) hypertension: Secondary | ICD-10-CM | POA: Diagnosis not present

## 2015-10-04 DIAGNOSIS — Z23 Encounter for immunization: Secondary | ICD-10-CM

## 2015-10-04 DIAGNOSIS — J4520 Mild intermittent asthma, uncomplicated: Secondary | ICD-10-CM | POA: Diagnosis not present

## 2015-10-04 DIAGNOSIS — M159 Polyosteoarthritis, unspecified: Secondary | ICD-10-CM

## 2015-10-04 MED ORDER — TRAMADOL HCL 50 MG PO TABS: 50.0000 mg | ORAL_TABLET | Freq: Four times a day (QID) | ORAL | Status: DC | PRN

## 2015-10-04 MED ORDER — HYDROCHLOROTHIAZIDE 25 MG PO TABS: ORAL_TABLET | ORAL | Status: DC

## 2015-10-04 MED ORDER — CELECOXIB 200 MG PO CAPS: ORAL_CAPSULE | ORAL | Status: DC

## 2015-10-04 MED ORDER — BENAZEPRIL HCL 40 MG PO TABS: 40.0000 mg | ORAL_TABLET | Freq: Every day | ORAL | Status: DC

## 2015-10-04 NOTE — Addendum Note (Signed)
Addended by: Griselda MinerJIMENEZ, Wyatt Galvan E on: 10/04/2015 08:44 AM   Modules accepted: Orders

## 2015-10-04 NOTE — Progress Notes (Signed)
Pre visit review using our clinic review tool, if applicable. No additional management support is needed unless otherwise documented below in the visit note. 

## 2015-10-04 NOTE — Patient Instructions (Signed)
Limit your sodium (Salt) intake  Please check your blood pressure on a regular basis.  If it is consistently greater than 150/90, please make an office appointment.    It is important that you exercise regularly, at least 20 minutes 3 to 4 times per week.  If you develop chest pain or shortness of breath seek  medical attention.  Return in 6 months for follow-up  

## 2015-10-04 NOTE — Progress Notes (Signed)
Subjective:    Patient ID: Kristy [name], female    DOB: 13-Jul-1941, 74 y.o.   MRN: [id number]  HPI  74 year old patient who is seen for follow-up.  She has essential hypertension, asthma.  She has significant osteoarthritis.  Has been fairly stable.  Past Medical History  Diagnosis Date  . ALLERGIC RHINITIS 04/07/2007  . ASTHMA 04/07/2007  . COLONIC POLYPS, HX OF 04/07/2007  . GERD 04/07/2007  . HYPERTENSION 04/07/2007  . UNSPECIFIED DISORDER TEETH&SUPPORTING STRUCTURES 09/09/2010    Social History   Social History  . Marital Status: Married    Spouse Name: N/A  . Number of Children: N/A  . Years of Education: N/A   Occupational History  . Not on file.   Social History Main Topics  . Smoking status: Former Smoker    Quit date: 10/19/1988  . Smokeless tobacco: Not on file  . Alcohol Use: 0.0 oz/week  . Drug Use: Not on file  . Sexual Activity: Not on file   Other Topics Concern  . Not on file   Social History Narrative    Past Surgical History  Procedure Laterality Date  . Cholecystectomy    . Abdominal hysterectomy    . Laminectomy      No family history on file.  Allergies  Allergen Reactions  . Aspirin Itching  . Clonidine Hives    Current Outpatient Prescriptions on File Prior to Visit  Medication Sig Dispense Refill  . albuterol (PROVENTIL HFA;VENTOLIN HFA) 108 (90 BASE) MCG/ACT inhaler Inhale 2 puffs into the lungs every 6 (six) hours as needed for wheezing. 1 Inhaler 2  . benazepril (LOTENSIN) 40 MG tablet TAKE 1 TABLET BY MOUTH EVERY DAY 90 tablet 3  . celecoxib (CELEBREX) 200 MG capsule TAKE 1 CAPSULE (200 MG TOTAL) BY MOUTH DAILY. 90 capsule 3  . Fluticasone-Salmeterol (ADVAIR DISKUS) 250-50 MCG/DOSE AEPB Inhale 1 puff into the lungs every 12 (twelve) hours. Shortness of breath and wheezing 60 each 2  . hydrochlorothiazide (HYDRODIURIL) 25 MG tablet TAKE 1 TABLET BY MOUTH EVERY DAY 90 tablet 3  . traMADol (ULTRAM) 50 MG tablet TAKE 1 TABLET BY  MOUTH EVERY 6 HOURS AS NEEDED PAIN 90 tablet 2  . trolamine salicylate (ASPERCREME) 10 % cream Apply topically as needed. pain      No current facility-administered medications on file prior to visit.    BP 140/80 mmHg  Pulse 81  Temp(Src) 98.6 F (37 C) (Oral)  Ht 5' (1.524 m)  Wt 168 lb (76.204 kg)  BMI 32.81 kg/m2  SpO2 95%      Review of Systems  Constitutional: Negative.   HENT: Negative for congestion, dental problem, hearing loss, rhinorrhea, sinus pressure, sore throat and tinnitus.   Eyes: Negative for pain, discharge and visual disturbance.  Respiratory: Negative for cough and shortness of breath.   Cardiovascular: Negative for chest pain, palpitations and leg swelling.  Gastrointestinal: Negative for nausea, vomiting, abdominal pain, diarrhea, constipation, blood in stool and abdominal distention.  Genitourinary: Negative for dysuria, urgency, frequency, hematuria, flank pain, vaginal bleeding, vaginal discharge, difficulty urinating, vaginal pain and pelvic pain.  Musculoskeletal: Positive for back pain and arthralgias. Negative for joint swelling and gait problem.  Skin: Negative for rash.  Neurological: Negative for dizziness, syncope, speech difficulty, weakness, numbness and headaches.  Hematological: Negative for adenopathy.  Psychiatric/Behavioral: Negative for behavioral problems, dysphoric mood and agitation. The patient is not nervous/anxious.        Objective:   Physical Exam  Constitutional:  She is oriented to person, place, and time. She appears well-developed and well-nourished.  HENT:  Head: Normocephalic.  Right Ear: External ear normal.  Left Ear: External ear normal.  Mouth/Throat: Oropharynx is clear and moist.  Eyes: Conjunctivae and EOM are normal. Pupils are equal, round, and reactive to light.  Neck: Normal range of motion. Neck [name]. No thyromegaly present.  Cardiovascular: Normal rate, regular rhythm, normal heart sounds and intact  distal pulses.   Pulmonary/Chest: Effort normal and breath sounds normal. No respiratory distress. She has no wheezes. She has no rales.  Abdominal: Soft. Bowel sounds are normal. She exhibits no mass. There is no tenderness.  Musculoskeletal: Normal range of motion.  Lymphadenopathy:    She has no cervical adenopathy.  Neurological: She is alert and oriented to person, place, and time.  Skin: Skin is warm and dry. No rash noted.  Psychiatric: She has a normal mood and affect. Her behavior is normal.          Assessment & Plan:    Essential hypertension, stable.  No change in therapy  Asthma, stable  Osteoarthritis.  Celebrex and tramadol refilled   CPX 6 months

## 2015-10-16 ENCOUNTER — Encounter: Payer: Self-pay | Admitting: *Deleted

## 2015-12-13 ENCOUNTER — Other Ambulatory Visit: Payer: Self-pay | Admitting: Internal Medicine

## 2016-01-13 ENCOUNTER — Ambulatory Visit (INDEPENDENT_AMBULATORY_CARE_PROVIDER_SITE_OTHER): Payer: Medicare Other | Admitting: Internal Medicine

## 2016-01-13 ENCOUNTER — Encounter: Payer: Self-pay | Admitting: Internal Medicine

## 2016-01-13 VITALS — BP 148/90 | HR 77 | Temp 98.1°F | Resp 20 | Ht 60.0 in | Wt 164.0 lb

## 2016-01-13 DIAGNOSIS — I10 Essential (primary) hypertension: Secondary | ICD-10-CM

## 2016-01-13 DIAGNOSIS — J3089 Other allergic rhinitis: Secondary | ICD-10-CM | POA: Diagnosis not present

## 2016-01-13 DIAGNOSIS — R519 Headache, unspecified: Secondary | ICD-10-CM

## 2016-01-13 DIAGNOSIS — H1132 Conjunctival hemorrhage, left eye: Secondary | ICD-10-CM | POA: Diagnosis not present

## 2016-01-13 DIAGNOSIS — R51 Headache: Secondary | ICD-10-CM | POA: Diagnosis not present

## 2016-01-13 MED ORDER — FLUTICASONE PROPIONATE 50 MCG/ACT NA SUSP: 2.0000 | Freq: Every day | NASAL | Status: DC

## 2016-01-13 NOTE — Progress Notes (Signed)
Subjective:    Patient ID: Kristy [name], female    DOB: 1941/05/18, 75 y.o.   MRN: [id number]  HPI  75 year old patient who has a history of allergic rhinitis and essential hypertension.  She has mild asthma. The past week she has had worsening headaches.  These are bilateral described as stabbing and at times throbbing.  She does describe some sinus congestion.  Over this week.  Her blood pressure has been labile.  She was seen by her eye doctor earlier today due to a sub-conjunctival hemorrhage involving the left eye. No history of chronic headaches  Past Medical History  Diagnosis Date  . ALLERGIC RHINITIS 04/07/2007  . ASTHMA 04/07/2007  . COLONIC POLYPS, HX OF 04/07/2007  . GERD 04/07/2007  . HYPERTENSION 04/07/2007  . UNSPECIFIED DISORDER TEETH&SUPPORTING STRUCTURES 09/09/2010    Social History   Social History  . Marital Status: Married    Spouse Name: N/A  . Number of Children: N/A  . Years of Education: N/A   Occupational History  . Not on file.   Social History Main Topics  . Smoking status: Former Smoker    Quit date: 10/19/1988  . Smokeless tobacco: Not on file  . Alcohol Use: 0.0 oz/week  . Drug Use: Not on file  . Sexual Activity: Not on file   Other Topics Concern  . Not on file   Social History Narrative    Past Surgical History  Procedure Laterality Date  . Cholecystectomy    . Abdominal hysterectomy    . Laminectomy      No family history on file.  Allergies  Allergen Reactions  . Aspirin Itching  . Clonidine Hives    Current Outpatient Prescriptions on File Prior to Visit  Medication Sig Dispense Refill  . ADVAIR DISKUS 250-50 MCG/DOSE AEPB INHALE 1 PUFF INTO THE LUNGS EVERY 12 (TWELVE) HOURS FOR SHORTNESS OF BREATH AND WHEEZING 60 each 2  . albuterol (PROVENTIL HFA;VENTOLIN HFA) 108 (90 BASE) MCG/ACT inhaler Inhale 2 puffs into the lungs every 6 (six) hours as needed for wheezing. 1 Inhaler 2  . benazepril (LOTENSIN) 40 MG tablet Take 1  tablet (40 mg total) by mouth daily. 90 tablet 3  . celecoxib (CELEBREX) 200 MG capsule TAKE 1 CAPSULE (200 MG TOTAL) BY MOUTH DAILY. 90 capsule 3  . hydrochlorothiazide (HYDRODIURIL) 25 MG tablet TAKE 1 TABLET BY MOUTH EVERY DAY 90 tablet 3  . traMADol (ULTRAM) 50 MG tablet Take 1 tablet (50 mg total) by mouth every 6 (six) hours as needed. 90 tablet 2  . trolamine salicylate (ASPERCREME) 10 % cream Apply topically as needed. pain      No current facility-administered medications on file prior to visit.    BP 148/90 mmHg  Pulse 77  Temp(Src) 98.1 F (36.7 C) (Oral)  Resp 20  Ht 5' (1.524 m)  Wt 164 lb (74.39 kg)  BMI 32.03 kg/m2  SpO2 95%     Review of Systems  Constitutional: Positive for fatigue.  HENT: Positive for congestion. Negative for dental problem, hearing loss, rhinorrhea, sinus pressure, sore throat and tinnitus.   Eyes: Positive for redness. Negative for pain, discharge and visual disturbance.  Respiratory: Negative for cough and shortness of breath.   Cardiovascular: Negative for chest pain, palpitations and leg swelling.  Gastrointestinal: Negative for nausea, vomiting, abdominal pain, diarrhea, constipation, blood in stool and abdominal distention.  Genitourinary: Negative for dysuria, urgency, frequency, hematuria, flank pain, vaginal bleeding, vaginal discharge, difficulty urinating, vaginal pain and  pelvic pain.  Musculoskeletal: Negative for joint swelling, arthralgias and gait problem.  Skin: Negative for rash.  Neurological: Positive for headaches. Negative for dizziness, syncope, speech difficulty, weakness and numbness.  Hematological: Negative for adenopathy.  Psychiatric/Behavioral: Negative for behavioral problems, dysphoric mood and agitation. The patient is not nervous/anxious.        Objective:   Physical Exam  Constitutional: She is oriented to person, place, and time. She appears well-developed and well-nourished.  HENT:  Head: Normocephalic.   Right Ear: External ear normal.  Left Ear: External ear normal.  Mouth/Throat: Oropharynx is clear and moist.  Left subconjunctival hemorrhage  No focal sinus tenderness  Eyes: Conjunctivae and EOM are normal. Pupils are equal, round, and reactive to light.  Neck: Normal range of motion. Neck supple. No thyromegaly present.  Cardiovascular: Normal rate, regular rhythm, normal heart sounds and intact distal pulses.   Pulmonary/Chest: Effort normal and breath sounds normal. No respiratory distress. She has no wheezes. She has no rales.  Abdominal: Soft. Bowel sounds are normal. She exhibits no mass. There is no tenderness.  Musculoskeletal: Normal range of motion.  Lymphadenopathy:    She has no cervical adenopathy.  Neurological: She is alert and oriented to person, place, and time.  Skin: Skin is warm and dry. No rash noted.  Psychiatric: She has a normal mood and affect. Her behavior is normal.          Assessment & Plan:   Headaches.  Probable flare allergic rhinitis.  Will treat aggressively.  Will place on a prednisone taper for 1 week.  Will add fluticasone nasal spray Labile hypertension Asthma, stable Left sub-conjunctival hemorrhage

## 2016-01-13 NOTE — Progress Notes (Signed)
Pre visit review using our clinic review tool, if applicable. No additional management support is needed unless otherwise documented below in the visit note. 

## 2016-01-13 NOTE — Patient Instructions (Signed)
Limit your sodium (Salt) intake  Used fluticasone nasal spray daily  Allegra daily  Please check your blood pressure on a regular basis.  If it is consistently greater than 150/90, please make an office appointment.    HOME CARE INSTRUCTIONS  Drink plenty of water. Water helps thin the mucus so your sinuses can drain more easily.  Use a humidifier.  Inhale steam 3-4 times a day (for example, sit in the bathroom with the shower running).  Apply a warm, moist washcloth to your face 3-4 times a day, or as directed by your health care provider.  Use saline nasal sprays to help moisten and clean your sinuses.  Take medicines only as directed by your health care provider.

## 2016-01-28 DIAGNOSIS — H2513 Age-related nuclear cataract, bilateral: Secondary | ICD-10-CM | POA: Diagnosis not present

## 2016-01-28 DIAGNOSIS — H1132 Conjunctival hemorrhage, left eye: Secondary | ICD-10-CM | POA: Diagnosis not present

## 2016-01-28 DIAGNOSIS — Z0100 Encounter for examination of eyes and vision without abnormal findings: Secondary | ICD-10-CM | POA: Diagnosis not present

## 2016-02-20 DIAGNOSIS — Z79899 Other long term (current) drug therapy: Secondary | ICD-10-CM | POA: Diagnosis not present

## 2016-02-20 DIAGNOSIS — M150 Primary generalized (osteo)arthritis: Secondary | ICD-10-CM | POA: Diagnosis not present

## 2016-02-20 DIAGNOSIS — M154 Erosive (osteo)arthritis: Secondary | ICD-10-CM | POA: Diagnosis not present

## 2016-02-20 DIAGNOSIS — M898X1 Other specified disorders of bone, shoulder: Secondary | ICD-10-CM | POA: Diagnosis not present

## 2016-04-03 ENCOUNTER — Ambulatory Visit (INDEPENDENT_AMBULATORY_CARE_PROVIDER_SITE_OTHER): Payer: Medicare Other | Admitting: Internal Medicine

## 2016-04-03 ENCOUNTER — Encounter: Payer: Self-pay | Admitting: Internal Medicine

## 2016-04-03 VITALS — BP 156/90 | HR 72 | Temp 98.6°F | Resp 20 | Ht 60.0 in | Wt 160.0 lb

## 2016-04-03 DIAGNOSIS — E2839 Other primary ovarian failure: Secondary | ICD-10-CM | POA: Diagnosis not present

## 2016-04-03 DIAGNOSIS — Z23 Encounter for immunization: Secondary | ICD-10-CM

## 2016-04-03 DIAGNOSIS — M150 Primary generalized (osteo)arthritis: Secondary | ICD-10-CM | POA: Diagnosis not present

## 2016-04-03 DIAGNOSIS — J4520 Mild intermittent asthma, uncomplicated: Secondary | ICD-10-CM | POA: Diagnosis not present

## 2016-04-03 DIAGNOSIS — E785 Hyperlipidemia, unspecified: Secondary | ICD-10-CM | POA: Diagnosis not present

## 2016-04-03 DIAGNOSIS — M159 Polyosteoarthritis, unspecified: Secondary | ICD-10-CM

## 2016-04-03 DIAGNOSIS — I10 Essential (primary) hypertension: Secondary | ICD-10-CM

## 2016-04-03 LAB — LIPID PANEL
Cholesterol: 208 mg/dL — ABNORMAL HIGH (ref 0–200)
HDL: 61.9 mg/dL (ref >39.00)
LDL Cholesterol: 123 mg/dL — ABNORMAL HIGH (ref 0–99)
NONHDL: 145.75
Total CHOL/HDL Ratio: 3
Triglycerides: 112 mg/dL (ref 0.0–149.0)
VLDL: 22.4 mg/dL (ref 0.0–40.0)

## 2016-04-03 LAB — CBC WITH DIFFERENTIAL/PLATELET
BASOS PCT: 0.7 % (ref 0.0–3.0)
Basophils Absolute: 0 10*3/uL (ref 0.0–0.1)
EOS ABS: 0.3 10*3/uL (ref 0.0–0.7)
EOS PCT: 4.7 % (ref 0.0–5.0)
HEMATOCRIT: 39.3 % (ref 36.0–46.0)
HEMOGLOBIN: 13.1 g/dL (ref 12.0–15.0)
LYMPHS PCT: 41.1 % (ref 12.0–46.0)
Lymphs Abs: 2.5 10*3/uL (ref 0.7–4.0)
MCHC: 33.4 g/dL (ref 30.0–36.0)
MCV: 90.3 fl (ref 78.0–100.0)
Monocytes Absolute: 0.4 10*3/uL (ref 0.1–1.0)
Monocytes Relative: 6.2 % (ref 3.0–12.0)
Neutro Abs: 2.9 10*3/uL (ref 1.4–7.7)
Neutrophils Relative %: 47.3 % (ref 43.0–77.0)
Platelets: 309 10*3/uL (ref 150.0–400.0)
RBC: 4.35 Mil/uL (ref 3.87–5.11)
RDW: 14.4 % (ref 11.5–15.5)
WBC: 6.1 10*3/uL (ref 4.0–10.5)

## 2016-04-03 LAB — COMPREHENSIVE METABOLIC PANEL
ALT: 12 U/L (ref 0–35)
AST: 12 U/L (ref 0–37)
Albumin: 4.3 g/dL (ref 3.5–5.2)
Alkaline Phosphatase: 52 U/L (ref 39–117)
BUN: 15 mg/dL (ref 6–23)
CHLORIDE: 102 meq/L (ref 96–112)
CO2: 30 mEq/L (ref 19–32)
Calcium: 9.7 mg/dL (ref 8.4–10.5)
Creatinine, Ser: 0.7 mg/dL (ref 0.40–1.20)
GFR: 104.42 mL/min (ref >60.00)
Glucose, Bld: 99 mg/dL (ref 70–99)
POTASSIUM: 3.8 meq/L (ref 3.5–5.1)
SODIUM: 141 meq/L (ref 135–145)
TOTAL PROTEIN: 7.3 g/dL (ref 6.0–8.3)
Total Bilirubin: 0.5 mg/dL (ref 0.2–1.2)

## 2016-04-03 LAB — TSH: TSH: 1.68 u[IU]/mL (ref 0.35–4.50)

## 2016-04-03 NOTE — Progress Notes (Signed)
Subjective:    Patient ID: Kristy [name], female    DOB: [birth year], 75 y.o.   MRN: [id number]  HPI 75 year old patient who is seen today for follow-up.  She has a history of essential hypertension, allergic rhinitis and asthma.  She has done quite well.  She does monitor home blood pressure readings with good results. No blood pressure medications today since she came in fasting. Doing quite well and remains quite active.  She bowls twice per week and is also very active with dance.  She mows her own lawn  Past Medical History  Diagnosis Date  . ALLERGIC RHINITIS 04/07/2007  . ASTHMA 04/07/2007  . COLONIC POLYPS, HX OF 04/07/2007  . GERD 04/07/2007  . HYPERTENSION 04/07/2007  . UNSPECIFIED DISORDER TEETH&SUPPORTING STRUCTURES 09/09/2010     Social History   Social History  . Marital Status: Married    Spouse Name: N/A  . Number of Children: N/A  . Years of Education: N/A   Occupational History  . Not on file.   Social History Main Topics  . Smoking status: Former Smoker    Quit date: 10/19/1988  . Smokeless tobacco: Not on file  . Alcohol Use: 0.0 oz/week  . Drug Use: Not on file  . Sexual Activity: Not on file   Other Topics Concern  . Not on file   Social History Narrative    Past Surgical History  Procedure Laterality Date  . Cholecystectomy    . Abdominal hysterectomy    . Laminectomy      No family history on file.  Allergies  Allergen Reactions  . Aspirin Itching  . Clonidine Hives    Current Outpatient Prescriptions on File Prior to Visit  Medication Sig Dispense Refill  . ADVAIR DISKUS 250-50 MCG/DOSE AEPB INHALE 1 PUFF INTO THE LUNGS EVERY 12 (TWELVE) HOURS FOR SHORTNESS OF BREATH AND WHEEZING 60 each 2  . albuterol (PROVENTIL HFA;VENTOLIN HFA) 108 (90 BASE) MCG/ACT inhaler Inhale 2 puffs into the lungs every 6 (six) hours as needed for wheezing. 1 Inhaler 2  . benazepril (LOTENSIN) 40 MG tablet Take 1 tablet (40 mg total) by mouth daily. 90 tablet  3  . celecoxib (CELEBREX) 200 MG capsule TAKE 1 CAPSULE (200 MG TOTAL) BY MOUTH DAILY. 90 capsule 3  . fluticasone (FLONASE) 50 MCG/ACT nasal spray Place 2 sprays into both nostrils daily. 16 g 6  . hydrochlorothiazide (HYDRODIURIL) 25 MG tablet TAKE 1 TABLET BY MOUTH EVERY DAY 90 tablet 3  . traMADol (ULTRAM) 50 MG tablet Take 1 tablet (50 mg total) by mouth every 6 (six) hours as needed. 90 tablet 2  . trolamine salicylate (ASPERCREME) 10 % cream Apply topically as needed. pain      No current facility-administered medications on file prior to visit.    BP 156/90 mmHg  Pulse 72  Temp(Src) 98.6 F (37 C) (Oral)  Resp 20  Ht 5' (1.524 m)  Wt 160 lb (72.576 kg)  BMI 31.25 kg/m2  SpO2 96%      Review of Systems  Constitutional: Negative.   HENT: Negative for congestion, dental problem, hearing loss, rhinorrhea, sinus pressure, sore throat and tinnitus.   Eyes: Negative for pain, discharge and visual disturbance.  Respiratory: Negative for cough and shortness of breath.   Cardiovascular: Negative for chest pain, palpitations and leg swelling.  Gastrointestinal: Negative for nausea, vomiting, abdominal pain, diarrhea, constipation, blood in stool and abdominal distention.  Genitourinary: Negative for dysuria, urgency, frequency, hematuria, flank pain, vaginal  bleeding, vaginal discharge, difficulty urinating, vaginal pain and pelvic pain.  Musculoskeletal: Negative for joint swelling, arthralgias and gait problem.  Skin: Negative for rash.  Neurological: Negative for dizziness, syncope, speech difficulty, weakness, numbness and headaches.  Hematological: Negative for adenopathy.  Psychiatric/Behavioral: Negative for behavioral problems, dysphoric mood and agitation. The patient is not nervous/anxious.        Objective:   Physical Exam  Constitutional: She is oriented to person, place, and time. She appears well-developed and well-nourished.  Blood pressure 150/85-90  HENT:    Head: Normocephalic.  Right Ear: External ear normal.  Left Ear: External ear normal.  Mouth/Throat: Oropharynx is clear and moist.  Eyes: Conjunctivae and EOM are normal. Pupils are equal, round, and reactive to light.  Neck: Normal range of motion. Neck supple. No thyromegaly present.  Cardiovascular: Normal rate, regular rhythm, normal heart sounds and intact distal pulses.   Pulmonary/Chest: Effort normal and breath sounds normal.  Abdominal: Soft. Bowel sounds are normal. She exhibits no mass. There is no tenderness.  Musculoskeletal: Normal range of motion.  Lymphadenopathy:    She has no cervical adenopathy.  Neurological: She is alert and oriented to person, place, and time.  Skin: Skin is warm and dry. No rash noted.  Psychiatric: She has a normal mood and affect. Her behavior is normal.          Assessment & Plan:   Hypertension.  Will continue home blood pressure monitoring.  No change in regimen Allergic rhinitis Asthma Osteoarthritis   We'll check screening lab Check bone density CPX 6 months  Rogelia BogaKWIATKOWSKI,PETER FRANK, MD

## 2016-04-03 NOTE — Patient Instructions (Signed)
Limit your sodium (Salt) intake  Please check your blood pressure on a regular basis.  If it is consistently greater than 150/90, please make an office appointment.    It is important that you exercise regularly, at least 20 minutes 3 to 4 times per week.  If you develop chest pain or shortness of breath seek  medical attention.  Return in 6 months for follow-up  

## 2016-04-03 NOTE — Progress Notes (Signed)
Pre visit review using our clinic review tool, if applicable. No additional management support is needed unless otherwise documented below in the visit note. 

## 2016-04-06 ENCOUNTER — Ambulatory Visit (INDEPENDENT_AMBULATORY_CARE_PROVIDER_SITE_OTHER)
Admission: RE | Admit: 2016-04-06 | Discharge: 2016-04-06 | Disposition: A | Discharge status: Home / Self Care | Payer: Medicare Other | Source: Ambulatory Visit | Attending: Internal Medicine | Admitting: Internal Medicine

## 2016-04-06 DIAGNOSIS — E2839 Other primary ovarian failure: Secondary | ICD-10-CM | POA: Diagnosis not present

## 2016-04-14 ENCOUNTER — Encounter: Payer: Medicare Other | Admitting: Internal Medicine

## 2016-05-27 ENCOUNTER — Other Ambulatory Visit: Payer: Self-pay | Admitting: *Deleted

## 2016-05-27 MED ORDER — MELOXICAM 15 MG PO TABS: 15.0000 mg | ORAL_TABLET | Freq: Every day | ORAL | 3 refills | Status: DC

## 2016-05-27 NOTE — Telephone Encounter (Signed)
Per [employer]: Rx for Celecoxib was changed to Meloxicam 15 mg to be taken once daily as okayed by Dr. Kirtland BouchardK. New Rx sent in to pharmacy and old one discontinued.

## 2016-06-04 ENCOUNTER — Other Ambulatory Visit: Payer: Self-pay | Admitting: Internal Medicine

## 2016-06-05 NOTE — Telephone Encounter (Signed)
Okay to refill? 
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
## 2016-07-03 ENCOUNTER — Ambulatory Visit: Payer: Medicare Other

## 2016-07-17 DIAGNOSIS — Z1231 Encounter for screening mammogram for malignant neoplasm of breast: Secondary | ICD-10-CM | POA: Diagnosis not present

## 2016-07-17 DIAGNOSIS — Z803 Family history of malignant neoplasm of breast: Secondary | ICD-10-CM | POA: Diagnosis not present

## 2016-07-17 LAB — HM MAMMOGRAPHY

## 2016-07-22 ENCOUNTER — Encounter: Payer: Self-pay | Admitting: Internal Medicine

## 2016-08-05 ENCOUNTER — Ambulatory Visit (INDEPENDENT_AMBULATORY_CARE_PROVIDER_SITE_OTHER): Payer: Medicare Other

## 2016-08-05 DIAGNOSIS — Z23 Encounter for immunization: Secondary | ICD-10-CM

## 2016-08-10 ENCOUNTER — Other Ambulatory Visit: Payer: Self-pay | Admitting: Internal Medicine

## 2016-08-25 DIAGNOSIS — Z79899 Other long term (current) drug therapy: Secondary | ICD-10-CM | POA: Diagnosis not present

## 2016-08-25 DIAGNOSIS — M154 Erosive (osteo)arthritis: Secondary | ICD-10-CM | POA: Diagnosis not present

## 2016-08-25 DIAGNOSIS — M150 Primary generalized (osteo)arthritis: Secondary | ICD-10-CM | POA: Diagnosis not present

## 2016-09-24 ENCOUNTER — Other Ambulatory Visit: Payer: Self-pay

## 2016-10-02 ENCOUNTER — Other Ambulatory Visit: Payer: Self-pay | Admitting: Internal Medicine

## 2016-10-07 ENCOUNTER — Encounter: Payer: Self-pay | Admitting: Internal Medicine

## 2016-10-07 ENCOUNTER — Ambulatory Visit (INDEPENDENT_AMBULATORY_CARE_PROVIDER_SITE_OTHER): Payer: Medicare Other | Admitting: Internal Medicine

## 2016-10-07 VITALS — BP 160/90 | HR 76 | Temp 98.0°F | Ht 60.5 in | Wt 161.0 lb

## 2016-10-07 DIAGNOSIS — I10 Essential (primary) hypertension: Secondary | ICD-10-CM

## 2016-10-07 DIAGNOSIS — M159 Polyosteoarthritis, unspecified: Secondary | ICD-10-CM

## 2016-10-07 DIAGNOSIS — M150 Primary generalized (osteo)arthritis: Secondary | ICD-10-CM | POA: Diagnosis not present

## 2016-10-07 DIAGNOSIS — J4520 Mild intermittent asthma, uncomplicated: Secondary | ICD-10-CM | POA: Diagnosis not present

## 2016-10-07 DIAGNOSIS — Z0000 Encounter for general adult medical examination without abnormal findings: Secondary | ICD-10-CM

## 2016-10-07 NOTE — Progress Notes (Signed)
Subjective:    Patient ID: Kristy PinaNancy [name], female    DOB: 09-17-1941, 75 y.o.   MRN: [id number]  HPI  75 year old patient who is seen today for a Medicare wellness visit and annual evaluation. She does quite well.  She does have a history of essential hypertension.  She has a history of osteoarthritis and has been followed by orthopedics as well as rheumatology. Arthritic pain is her only complaint. She remains quite active with yoga daily walking and bowls with 2 leagues.  She does have responsibilities as primary caregiver for her disabled husband and her elderly mother  She does monitor home blood pressure readings with readings in the 1:30 over 80 range, generally  She has a history mild asthma, which has been stable   Past Medical History:  Diagnosis Date  . ALLERGIC RHINITIS 04/07/2007  . ASTHMA 04/07/2007  . COLONIC POLYPS, HX OF 04/07/2007  . GERD 04/07/2007  . HYPERTENSION 04/07/2007  . UNSPECIFIED DISORDER TEETH&SUPPORTING STRUCTURES 09/09/2010     Social History   Social History  . Marital status: Married    Spouse name: N/A  . Number of children: N/A  . Years of education: N/A   Occupational History  . Not on file.   Social History Main Topics  . Smoking status: Former Smoker    Quit date: 10/19/1988  . Smokeless tobacco: Not on file  . Alcohol use 0.0 oz/week  . Drug use: Unknown  . Sexual activity: Not on file   Other Topics Concern  . Not on file   Social History Narrative  . No narrative on file    Past Surgical History:  Procedure Laterality Date  . ABDOMINAL HYSTERECTOMY    . CHOLECYSTECTOMY    . LAMINECTOMY      No family history on file.  Allergies  Allergen Reactions  . Aspirin Itching  . Clonidine Hives    Current Outpatient Prescriptions on File Prior to Visit  Medication Sig Dispense Refill  . ADVAIR DISKUS 250-50 MCG/DOSE AEPB INHALE 1 PUFF INTO THE LUNGS EVERY 12 (TWELVE) HOURS FOR SHORTNESS OF BREATH AND WHEEZING 60 each 2  .  albuterol (PROVENTIL HFA;VENTOLIN HFA) 108 (90 BASE) MCG/ACT inhaler Inhale 2 puffs into the lungs every 6 (six) hours as needed for wheezing. 1 Inhaler 2  . benazepril (LOTENSIN) 40 MG tablet Take 1 tablet (40 mg total) by mouth daily. 90 tablet 3  . fluticasone (FLONASE) 50 MCG/ACT nasal spray Place 2 sprays into both nostrils daily. 16 g 6  . hydrochlorothiazide (HYDRODIURIL) 25 MG tablet TAKE 1 TABLET BY MOUTH EVERY DAY 90 tablet 3  . meloxicam (MOBIC) 15 MG tablet Take 1 tablet (15 mg total) by mouth daily. 90 tablet 3  . traMADol (ULTRAM) 50 MG tablet TAKE 1 TABLET BY MOUTH EVERY 6 HOURS AS NEEDED 90 tablet 2  . trolamine salicylate (ASPERCREME) 10 % cream Apply topically as needed. pain      No current facility-administered medications on file prior to visit.     BP (!) 160/90 (BP Location: Left Arm, Patient Position: Sitting, Cuff Size: Normal)   Pulse 76   Temp 98 F (36.7 C) (Oral)   Ht 5' 0.5" (1.537 m)   Wt 161 lb (73 kg)   SpO2 97%   BMI 30.93 kg/m   Medicare wellness visit  1. Risk factors, based on past  M,S,F history.  Cardio vascular risk factors include hypertension.  2.  Physical activities:remains quite active, although does have some arthritic  complaints.  She walks her dog twice daily and participates in yoga and dance activities and bowls at least twice weekly into bowling leagues  3.  Depression/mood:no history of major depression or mood disorder  4.  Hearing:no deficits  5.  ADL's:independent  6.  Fall risk:low  7.  Home safety:no problems identified  8.  Height weight, and visual acuity;height and weight stable no change in visual acuity  9.  Counseling:continue active lifestyle and heart healthy diet  10. Lab orders based on risk factors:laboratory studies reviewed from June of this year  6511. Referral :follow-up rheumatology  12. Care plan:continue efforts at aggressive risk factor modification.  Active lifestyle.  Heart healthy diet all  encouraged  13. Cognitive assessment: alert in order with normal affect.  No cognitive dysfunction 14. Screening: Patient provided with a written and personalized 5-10 year screening schedule in the AVS.    15. Provider List Update: primary care.  Rheumatology and orthopedic   Review of Systems  Constitutional: Negative.   HENT: Negative for congestion, dental problem, hearing loss, rhinorrhea, sinus pressure, sore throat and tinnitus.   Eyes: Negative for pain, discharge and visual disturbance.  Respiratory: Negative for cough and shortness of breath.   Cardiovascular: Negative for chest pain, palpitations and leg swelling.  Gastrointestinal: Negative for abdominal distention, abdominal pain, blood in stool, constipation, diarrhea, nausea and vomiting.  Genitourinary: Negative for difficulty urinating, dysuria, flank pain, frequency, hematuria, pelvic pain, urgency, vaginal bleeding, vaginal discharge and vaginal pain.  Musculoskeletal: Positive for arthralgias, back pain, neck pain and neck stiffness. Negative for gait problem and joint swelling.  Skin: Negative for rash.  Neurological: Negative for dizziness, syncope, speech difficulty, weakness, numbness and headaches.  Hematological: Negative for adenopathy.  Psychiatric/Behavioral: Negative for agitation, behavioral problems and dysphoric mood. The patient is not nervous/anxious.        Objective:   Physical Exam  Constitutional: She is oriented to person, place, and time. She appears well-developed and well-nourished.  HENT:  Head: Normocephalic and atraumatic.  Right Ear: External ear normal.  Left Ear: External ear normal.  Mouth/Throat: Oropharynx is clear and moist.  Eyes: Conjunctivae and EOM are normal.  Neck: Normal range of motion. Neck supple. No JVD present. No thyromegaly present.  Cardiovascular: Normal rate, regular rhythm, normal heart sounds and intact distal pulses.   No murmur heard. Pulmonary/Chest: Effort  normal and breath sounds normal. She has no wheezes. She has no rales.  Abdominal: Soft. Bowel sounds are normal. She exhibits no distension and no mass. There is no tenderness. There is no rebound and no guarding.  Musculoskeletal: Normal range of motion. She exhibits no edema or tenderness.  Neurological: She is alert and oriented to person, place, and time. She has normal reflexes. No cranial nerve deficit. She exhibits normal muscle tone. Coordination normal.  Skin: Skin is warm and dry. No rash noted.  Psychiatric: She has a normal mood and affect. Her behavior is normal.          Assessment & Plan:   Preventive health examination Subsequent annual Medicare wellness visit Essential hypertension.  Blood pressure high normal today but generally runs better at home.  No change in medical regimen.  Will encourage low-salt diet regular exercise.  Continue home blood pressure monitoring Osteoarthritis.  Follow-up rheumatology.  Continue anti-inflammatory medications  Follow-up 6 months or as needed  Rogelia BogaKWIATKOWSKI,Shameek Nyquist FRANK

## 2016-10-07 NOTE — Progress Notes (Signed)
Pre visit review using our clinic review tool, if applicable. No additional management support is needed unless otherwise documented below in the visit note. 

## 2016-10-07 NOTE — Patient Instructions (Signed)
Limit your sodium (Salt) intake    It is important that you exercise regularly, at least 20 minutes 3 to 4 times per week.  If you develop chest pain or shortness of breath seek  medical attention.  Please check your blood pressure on a regular basis.  If it is consistently greater than 150/90, please make an office appointment.  Return in 6 months for follow-up  Take a calcium supplement, plus 800-1200 units of vitamin D 

## 2016-10-13 ENCOUNTER — Other Ambulatory Visit: Payer: Self-pay | Admitting: Emergency Medicine

## 2016-10-13 MED ORDER — FLUTICASONE PROPIONATE 50 MCG/ACT NA SUSP: 2.0000 | Freq: Every day | NASAL | 6 refills | Status: DC

## 2016-10-14 ENCOUNTER — Other Ambulatory Visit: Payer: Self-pay | Admitting: Emergency Medicine

## 2016-10-23 ENCOUNTER — Other Ambulatory Visit: Payer: Self-pay | Admitting: Internal Medicine

## 2016-11-03 ENCOUNTER — Encounter: Payer: Self-pay | Admitting: Internal Medicine

## 2016-11-03 ENCOUNTER — Ambulatory Visit (INDEPENDENT_AMBULATORY_CARE_PROVIDER_SITE_OTHER): Payer: Medicare Other | Admitting: Internal Medicine

## 2016-11-03 VITALS — BP 130/80 | HR 86 | Temp 98.0°F | Ht 60.5 in | Wt 159.8 lb

## 2016-11-03 DIAGNOSIS — I10 Essential (primary) hypertension: Secondary | ICD-10-CM

## 2016-11-03 DIAGNOSIS — J181 Lobar pneumonia, unspecified organism: Secondary | ICD-10-CM | POA: Diagnosis not present

## 2016-11-03 DIAGNOSIS — J4520 Mild intermittent asthma, uncomplicated: Secondary | ICD-10-CM

## 2016-11-03 DIAGNOSIS — J189 Pneumonia, unspecified organism: Secondary | ICD-10-CM

## 2016-11-03 MED ORDER — HYDROCODONE-ACETAMINOPHEN 5-325 MG PO TABS: 1.0000 | ORAL_TABLET | Freq: Four times a day (QID) | ORAL | 0 refills | Status: DC | PRN

## 2016-11-03 MED ORDER — AZITHROMYCIN 250 MG PO TABS: ORAL_TABLET | ORAL | 0 refills | Status: DC

## 2016-11-03 NOTE — Progress Notes (Signed)
Pre visit review using our clinic review tool, if applicable. No additional management support is needed unless otherwise documented below in the visit note. 

## 2016-11-03 NOTE — Patient Instructions (Addendum)
Take over-the-counter expectorants and cough medications such as  Mucinex DM.  Call if there is no improvement in 5 to 7 days or if  you develop worsening cough, fever, or new symptoms, such as shortness of breath or chest pain.  Take your antibiotic as prescribed until ALL of it is gone, but stop if you develop a rash, swelling, or any side effects of the medication.  Contact our office as soon as possible if  there are side effects of the medication.  Hydrocodone.  One tablet every 6 hours as needed for cough  Hydrate and Humidify  Drink enough water to keep your urine clear or pale yellow. Staying hydrated will help to thin your mucus.  Use a cool mist humidifier to keep the humidity level in your home above 50%.  Inhale steam for 10-15 minutes, 3-4 times a day or as told by your health care provider. You can do this in the bathroom while a hot shower is running.  Limit your exposure to cool or dry air. Rest  Rest as much as possible.  Sleep with your head raised (elevated).  Make sure to get enough sleep each night.  Please call for any clinical worsening such as wheezing, fever, shortness of breath or chills

## 2016-11-03 NOTE — Progress Notes (Signed)
Subjective:    Patient ID: Kristy [name], female    DOB: 06/24/1941, 76 y.o.   MRN: [id number]  HPI  [age] year old patient who has a history of essential hypertension and chronic asthma. For the past 2 weeks she has had increasing cough and wheezing.  This has intensified over the past 2-3 days and has been associated with more purulent sputum production. Medical regimen includes maintenance bronchodilators.  She has been using Mucinex. Denies any high fever or chills  Past Medical History:  Diagnosis Date  . ALLERGIC RHINITIS 04/07/2007  . ASTHMA 04/07/2007  . COLONIC POLYPS, HX OF 04/07/2007  . GERD 04/07/2007  . HYPERTENSION 04/07/2007  . UNSPECIFIED DISORDER TEETH&SUPPORTING STRUCTURES 09/09/2010     Social History   Social History  . Marital status: Married    Spouse name: N/A  . Number of children: N/A  . Years of education: N/A   Occupational History  . Not on file.   Social History Main Topics  . Smoking status: Former Smoker    Quit date: 10/19/1988  . Smokeless tobacco: Not on file  . Alcohol use 0.0 oz/week  . Drug use: Unknown  . Sexual activity: Not on file   Other Topics Concern  . Not on file   Social History Narrative  . No narrative on file    Past Surgical History:  Procedure Laterality Date  . ABDOMINAL HYSTERECTOMY    . CHOLECYSTECTOMY    . LAMINECTOMY      No family history on file.  Allergies  Allergen Reactions  . Aspirin Itching  . Clonidine Hives    Current Outpatient Prescriptions on File Prior to Visit  Medication Sig Dispense Refill  . ADVAIR DISKUS 250-50 MCG/DOSE AEPB INHALE 1 PUFF INTO THE LUNGS EVERY 12 (TWELVE) HOURS FOR SHORTNESS OF BREATH AND WHEEZING 60 each 2  . albuterol (PROVENTIL HFA;VENTOLIN HFA) 108 (90 BASE) MCG/ACT inhaler Inhale 2 puffs into the lungs every 6 (six) hours as needed for wheezing. 1 Inhaler 2  . benazepril (LOTENSIN) 40 MG tablet Take 1 tablet (40 mg total) by mouth daily. 90 tablet 3  . fluticasone  (FLONASE) 50 MCG/ACT nasal spray Place 2 sprays into both nostrils daily. 16 g 6  . hydrochlorothiazide (HYDRODIURIL) 25 MG tablet TAKE 1 TABLET BY MOUTH EVERY DAY 90 tablet 3  . meloxicam (MOBIC) 15 MG tablet Take 1 tablet (15 mg total) by mouth daily. 90 tablet 3  . traMADol (ULTRAM) 50 MG tablet TAKE 1 TABLET BY MOUTH EVERY 6 HOURS AS NEEDED 90 tablet 2  . trolamine salicylate (ASPERCREME) 10 % cream Apply topically as needed. pain      No current facility-administered medications on file prior to visit.     BP 130/80 (BP Location: Left Arm, Patient Position: Sitting, Cuff Size: Normal)   Pulse 86   Temp 98 F (36.7 C) (Oral)   Ht 5' 0.5" (1.537 m)   Wt 159 lb 12.8 oz (72.5 kg)   SpO2 97%   BMI 30.70 kg/m     Review of Systems  Constitutional: Positive for activity change, appetite change and fatigue.  HENT: Negative for congestion, dental problem, hearing loss, rhinorrhea, sinus pressure, sore throat and tinnitus.   Eyes: Negative for pain, discharge and visual disturbance.  Respiratory: Positive for cough, shortness of breath and wheezing.   Cardiovascular: Negative for chest pain, palpitations and leg swelling.  Gastrointestinal: Negative for abdominal distention, abdominal pain, blood in stool, constipation, diarrhea, nausea and vomiting.  Genitourinary:  Negative for difficulty urinating, dysuria, flank pain, frequency, hematuria, pelvic pain, urgency, vaginal bleeding, vaginal discharge and vaginal pain.  Musculoskeletal: Negative for arthralgias, gait problem and joint swelling.  Skin: Negative for rash.  Neurological: Negative for dizziness, syncope, speech difficulty, weakness, numbness and headaches.  Hematological: Negative for adenopathy.  Psychiatric/Behavioral: Negative for agitation, behavioral problems and dysphoric mood. The patient is not nervous/anxious.        Objective:   Physical Exam  Constitutional: She is oriented to person, place, and time. She  appears well-developed and well-nourished. No distress.  Appears unwell, but in no acute distress Afebrile Frequent paroxysms of coughing  HENT:  Head: Normocephalic.  Right Ear: External ear normal.  Left Ear: External ear normal.  Mouth/Throat: Oropharynx is clear and moist.  Eyes: Conjunctivae and EOM are normal. Pupils are equal, round, and reactive to light.  Neck: Normal range of motion. Neck supple. No thyromegaly present.  Cardiovascular: Normal rate, regular rhythm, normal heart sounds and intact distal pulses.   Pulmonary/Chest: Effort normal.  Considerable rales right lower lung  Abdominal: Soft. Bowel sounds are normal. She exhibits no mass. There is no tenderness.  Musculoskeletal: Normal range of motion.  Lymphadenopathy:    She has no cervical adenopathy.  Neurological: She is alert and oriented to person, place, and time.  Skin: Skin is warm and dry. No rash noted.  Psychiatric: She has a normal mood and affect. Her behavior is normal.          Assessment & Plan:   Suspect right lower lobe community-acquired pneumonia.  Will start on azithromycin.  Continue expectorants and maintenance bronchodilators.  Will treat cough with hydrocodone. Chest x-ray deferred at this time since it is now 5 PM and would need to be deferred till tomorrow and will not affect treatment.  She report any clinical worsening Essential hypertension Chronic asthma  Rogelia Boga

## 2016-11-11 ENCOUNTER — Encounter: Payer: Self-pay | Admitting: Internal Medicine

## 2016-11-11 ENCOUNTER — Ambulatory Visit (INDEPENDENT_AMBULATORY_CARE_PROVIDER_SITE_OTHER)
Admission: RE | Admit: 2016-11-11 | Discharge: 2016-11-11 | Disposition: A | Discharge status: Home / Self Care | Payer: Medicare Other | Source: Ambulatory Visit | Attending: Internal Medicine | Admitting: Internal Medicine

## 2016-11-11 ENCOUNTER — Ambulatory Visit (INDEPENDENT_AMBULATORY_CARE_PROVIDER_SITE_OTHER): Payer: Medicare Other | Admitting: Internal Medicine

## 2016-11-11 VITALS — BP 122/70 | HR 80 | Temp 98.2°F | Ht 60.5 in | Wt 161.0 lb

## 2016-11-11 DIAGNOSIS — J4520 Mild intermittent asthma, uncomplicated: Secondary | ICD-10-CM

## 2016-11-11 DIAGNOSIS — I10 Essential (primary) hypertension: Secondary | ICD-10-CM | POA: Diagnosis not present

## 2016-11-11 DIAGNOSIS — J189 Pneumonia, unspecified organism: Secondary | ICD-10-CM

## 2016-11-11 DIAGNOSIS — J181 Lobar pneumonia, unspecified organism: Secondary | ICD-10-CM

## 2016-11-11 DIAGNOSIS — R0602 Shortness of breath: Secondary | ICD-10-CM | POA: Diagnosis not present

## 2016-11-11 DIAGNOSIS — R05 Cough: Secondary | ICD-10-CM | POA: Diagnosis not present

## 2016-11-11 LAB — COMPREHENSIVE METABOLIC PANEL
ALT: 57 U/L — ABNORMAL HIGH (ref 0–35)
AST: 40 U/L — ABNORMAL HIGH (ref 0–37)
Albumin: 4.4 g/dL (ref 3.5–5.2)
Alkaline Phosphatase: 165 U/L — ABNORMAL HIGH (ref 39–117)
BUN: 19 mg/dL (ref 6–23)
CALCIUM: 9.6 mg/dL (ref 8.4–10.5)
CHLORIDE: 102 meq/L (ref 96–112)
CO2: 30 mEq/L (ref 19–32)
Creatinine, Ser: 0.69 mg/dL (ref 0.40–1.20)
GFR: 106 mL/min (ref >60.00)
Glucose, Bld: 74 mg/dL (ref 70–99)
POTASSIUM: 3.8 meq/L (ref 3.5–5.1)
SODIUM: 140 meq/L (ref 135–145)
Total Bilirubin: 0.4 mg/dL (ref 0.2–1.2)
Total Protein: 7.3 g/dL (ref 6.0–8.3)

## 2016-11-11 LAB — CBC WITH DIFFERENTIAL/PLATELET
BASOS PCT: 0.4 % (ref 0.0–3.0)
Basophils Absolute: 0 10*3/uL (ref 0.0–0.1)
EOS PCT: 5.2 % — AB (ref 0.0–5.0)
Eosinophils Absolute: 0.4 10*3/uL (ref 0.0–0.7)
HEMATOCRIT: 39.7 % (ref 36.0–46.0)
HEMOGLOBIN: 13.6 g/dL (ref 12.0–15.0)
LYMPHS PCT: 43.9 % (ref 12.0–46.0)
Lymphs Abs: 3.4 10*3/uL (ref 0.7–4.0)
MCHC: 34.1 g/dL (ref 30.0–36.0)
MCV: 89.5 fl (ref 78.0–100.0)
MONOS PCT: 6.3 % (ref 3.0–12.0)
Monocytes Absolute: 0.5 10*3/uL (ref 0.1–1.0)
Neutro Abs: 3.4 10*3/uL (ref 1.4–7.7)
Neutrophils Relative %: 44.2 % (ref 43.0–77.0)
Platelets: 316 10*3/uL (ref 150.0–400.0)
RBC: 4.44 Mil/uL (ref 3.87–5.11)
RDW: 13.6 % (ref 11.5–15.5)
WBC: 7.7 10*3/uL (ref 4.0–10.5)

## 2016-11-11 MED ORDER — CEFTRIAXONE SODIUM 500 MG IJ SOLR
500.0000 mg | Freq: Once | INTRAMUSCULAR | Status: AC
  Administered 2016-11-11: 500 mg via INTRAMUSCULAR

## 2016-11-11 MED ORDER — LEVOFLOXACIN 500 MG PO TABS: 500.0000 mg | ORAL_TABLET | Freq: Every day | ORAL | 0 refills | Status: DC

## 2016-11-11 NOTE — Patient Instructions (Addendum)
Take your antibiotic as prescribed until ALL of it is gone, but stop if you develop a rash, swelling, or any side effects of the medication.  Contact our office as soon as possible if  there are side effects of the medication.  Take over-the-counter expectorants and cough medications such as  Mucinex DM.  Call if there is no improvement in 5 to 7 days or if  you develop worsening cough, fever, or new symptoms, such as shortness of breath or chest pain.  Chest x-ray as discussed  Report any new or worsening symptoms such as fever or chills or worsening shortness of breath Community-Acquired Pneumonia, Adult Introduction Pneumonia is an infection of the lungs. One type of pneumonia can happen while a person is in a hospital. A different type can happen when a person is not in a hospital (community-acquired pneumonia). It is easy for this kind to spread from person to person. It can spread to you if you breathe near an infected person who coughs or sneezes. Some symptoms include:  A dry cough.  A wet (productive) cough.  Fever.  Sweating.  Chest pain. Follow these instructions at home:  Take over-the-counter and prescription medicines only as told by your doctor.  Only take cough medicine if you are losing sleep.  If you were prescribed an antibiotic medicine, take it as told by your doctor. Do not stop taking the antibiotic even if you start to feel better.  Sleep with your head and neck raised (elevated). You can do this by putting a few pillows under your head, or you can sleep in a recliner.  Do not use tobacco products. These include cigarettes, chewing tobacco, and e-cigarettes. If you need help quitting, ask your doctor.  Drink enough water to keep your pee (urine) clear or pale yellow. A shot (vaccine) can help prevent pneumonia. Shots are often suggested for:  People older than 78 years of age.  People older than 78 years of age:  Who are having cancer treatment.  Who  have long-term (chronic) lung disease.  Who have problems with their body's defense system (immune system). You may also prevent pneumonia if you take these actions:  Get the flu (influenza) shot every year.  Go to the dentist as often as told.  Wash your hands often. If soap and water are not available, use hand sanitizer. Contact a doctor if:  You have a fever.  You lose sleep because your cough medicine does not help. Get help right away if:  You are short of breath and it gets worse.  You have more chest pain.  Your sickness gets worse. This is very serious if:  You are an older adult.  Your body's defense system is weak.  You cough up blood. This information is not intended to replace advice given to you by your health care provider. Make sure you discuss any questions you have with your health care provider. Document Released: 03/23/2008 Document Revised: 03/12/2016 Document Reviewed: 01/30/2015  2017 [employer]

## 2016-11-11 NOTE — Progress Notes (Signed)
Subjective:    Patient ID: Kristy [name], female    DOB: 04-08-1941, 76 y.o.   MRN: [id number]  HPI  76 year old patient who has a history of chronic asthma.  She also has essential hypertension. She was seen 8 days ago and treated for suspected right lower lobe community-acquired pneumonia.  She was treated with a Zithromax and has not improved.  She remains weak with occasional wheezing and mildly productive cough.  A chest x-ray was not obtained 8 days ago. Denies any documented fever or chills.  Past Medical History:  Diagnosis Date  . ALLERGIC RHINITIS 04/07/2007  . ASTHMA 04/07/2007  . COLONIC POLYPS, HX OF 04/07/2007  . GERD 04/07/2007  . HYPERTENSION 04/07/2007  . UNSPECIFIED DISORDER TEETH&SUPPORTING STRUCTURES 09/09/2010     Social History   Social History  . Marital status: Married    Spouse name: N/A  . Number of children: N/A  . Years of education: N/A   Occupational History  . Not on file.   Social History Main Topics  . Smoking status: Former Smoker    Quit date: 10/19/1988  . Smokeless tobacco: Not on file  . Alcohol use 0.0 oz/week  . Drug use: Unknown  . Sexual activity: Not on file   Other Topics Concern  . Not on file   Social History Narrative  . No narrative on file    Past Surgical History:  Procedure Laterality Date  . ABDOMINAL HYSTERECTOMY    . CHOLECYSTECTOMY    . LAMINECTOMY      No family history on file.  Allergies  Allergen Reactions  . Aspirin Itching  . Clonidine Hives    Current Outpatient Prescriptions on File Prior to Visit  Medication Sig Dispense Refill  . ADVAIR DISKUS 250-50 MCG/DOSE AEPB INHALE 1 PUFF INTO THE LUNGS EVERY 12 (TWELVE) HOURS FOR SHORTNESS OF BREATH AND WHEEZING 60 each 2  . albuterol (PROVENTIL HFA;VENTOLIN HFA) 108 (90 BASE) MCG/ACT inhaler Inhale 2 puffs into the lungs every 6 (six) hours as needed for wheezing. 1 Inhaler 2  . benazepril (LOTENSIN) 40 MG tablet Take 1 tablet (40 mg total) by mouth  daily. 90 tablet 3  . fluticasone (FLONASE) 50 MCG/ACT nasal spray Place 2 sprays into both nostrils daily. 16 g 6  . hydrochlorothiazide (HYDRODIURIL) 25 MG tablet TAKE 1 TABLET BY MOUTH EVERY DAY 90 tablet 3  . HYDROcodone-acetaminophen (NORCO/VICODIN) 5-325 MG tablet Take 1 tablet by mouth every 6 (six) hours as needed for moderate pain. 30 tablet 0  . meloxicam (MOBIC) 15 MG tablet Take 1 tablet (15 mg total) by mouth daily. 90 tablet 3  . traMADol (ULTRAM) 50 MG tablet TAKE 1 TABLET BY MOUTH EVERY 6 HOURS AS NEEDED 90 tablet 2  . trolamine salicylate (ASPERCREME) 10 % cream Apply topically as needed. pain      No current facility-administered medications on file prior to visit.     BP 122/70 (BP Location: Left Arm, Patient Position: Sitting, Cuff Size: Normal)   Pulse 80   Temp 98.2 F (36.8 C) (Oral)   Ht 5' 0.5" (1.537 m)   Wt 161 lb (73 kg)   SpO2 98%   BMI 30.93 kg/m     Review of Systems  Constitutional: Positive for activity change, appetite change and fatigue. Negative for chills, diaphoresis and fever.  HENT: Negative for congestion, dental problem, hearing loss, rhinorrhea, sinus pressure, sore throat and tinnitus.   Eyes: Negative for pain, discharge and visual disturbance.  Respiratory:  Positive for cough, shortness of breath and wheezing.   Cardiovascular: Negative for chest pain, palpitations and leg swelling.  Gastrointestinal: Negative for abdominal distention, abdominal pain, blood in stool, constipation, diarrhea, nausea and vomiting.  Genitourinary: Negative for difficulty urinating, dysuria, flank pain, frequency, hematuria, pelvic pain, urgency, vaginal bleeding, vaginal discharge and vaginal pain.  Musculoskeletal: Negative for arthralgias, gait problem and joint swelling.  Skin: Negative for rash.  Neurological: Negative for dizziness, syncope, speech difficulty, weakness, numbness and headaches.  Hematological: Negative for adenopathy.    Psychiatric/Behavioral: Negative for agitation, behavioral problems and dysphoric mood. The patient is not nervous/anxious.        Objective:   Physical Exam  Constitutional: She is oriented to person, place, and time. She appears well-developed and well-nourished. No distress.  Appears unwell, but in no acute distress Afebrile Pulse 80 O2 saturation 98% Blood pressure controlled  HENT:  Head: Normocephalic.  Right Ear: External ear normal.  Left Ear: External ear normal.  Mouth/Throat: Oropharynx is clear and moist.  Well-hydrated  Eyes: Conjunctivae and EOM are normal. Pupils are equal, round, and reactive to light.  Neck: Normal range of motion. Neck supple. No thyromegaly present.  Cardiovascular: Normal rate, regular rhythm, normal heart sounds and intact distal pulses.   No tachycardia  Pulmonary/Chest: Effort normal. She has rales.  No increased work of breathing Rales involving right lower lung field  Abdominal: Soft. Bowel sounds are normal. She exhibits no mass. There is no tenderness.  Musculoskeletal: Normal range of motion.  Lymphadenopathy:    She has no cervical adenopathy.  Neurological: She is alert and oriented to person, place, and time.  Skin: Skin is warm and dry. No rash noted.  Psychiatric: She has a normal mood and affect. Her behavior is normal.          Assessment & Plan:   Suspected right lower lobe pneumonia.  Will check lab and  a 2 view chest x-ray Treat with Levaquin 500 milligrams daily for one week.  Continue humidification expectorants.  Will also treat with Rocephin 500 mg IM  Rogelia Boga

## 2016-11-11 NOTE — Progress Notes (Signed)
Pre visit review using our clinic review tool, if applicable. No additional management support is needed unless otherwise documented below in the visit note. 

## 2016-12-16 ENCOUNTER — Other Ambulatory Visit: Payer: Self-pay | Admitting: Internal Medicine

## 2016-12-17 ENCOUNTER — Other Ambulatory Visit: Payer: Self-pay | Admitting: Internal Medicine

## 2017-02-20 ENCOUNTER — Other Ambulatory Visit: Payer: Self-pay | Admitting: Internal Medicine

## 2017-02-23 DIAGNOSIS — Z79899 Other long term (current) drug therapy: Secondary | ICD-10-CM | POA: Diagnosis not present

## 2017-02-23 DIAGNOSIS — M150 Primary generalized (osteo)arthritis: Secondary | ICD-10-CM | POA: Diagnosis not present

## 2017-02-23 DIAGNOSIS — M154 Erosive (osteo)arthritis: Secondary | ICD-10-CM | POA: Diagnosis not present

## 2017-02-23 DIAGNOSIS — Z6830 Body mass index (BMI) 30.0-30.9, adult: Secondary | ICD-10-CM | POA: Diagnosis not present

## 2017-02-23 DIAGNOSIS — E669 Obesity, unspecified: Secondary | ICD-10-CM | POA: Diagnosis not present

## 2017-03-10 DIAGNOSIS — M79675 Pain in left toe(s): Secondary | ICD-10-CM | POA: Diagnosis not present

## 2017-03-22 ENCOUNTER — Other Ambulatory Visit: Payer: Self-pay | Admitting: Internal Medicine

## 2017-04-04 ENCOUNTER — Other Ambulatory Visit: Payer: Self-pay | Admitting: Internal Medicine

## 2017-04-07 ENCOUNTER — Encounter: Payer: Self-pay | Admitting: Internal Medicine

## 2017-04-07 ENCOUNTER — Ambulatory Visit (INDEPENDENT_AMBULATORY_CARE_PROVIDER_SITE_OTHER): Payer: Medicare Other | Admitting: Internal Medicine

## 2017-04-07 VITALS — BP 138/82 | HR 76 | Temp 97.8°F | Wt 164.2 lb

## 2017-04-07 DIAGNOSIS — I10 Essential (primary) hypertension: Secondary | ICD-10-CM

## 2017-04-07 DIAGNOSIS — J4520 Mild intermittent asthma, uncomplicated: Secondary | ICD-10-CM | POA: Diagnosis not present

## 2017-04-07 DIAGNOSIS — M159 Polyosteoarthritis, unspecified: Secondary | ICD-10-CM

## 2017-04-07 DIAGNOSIS — M150 Primary generalized (osteo)arthritis: Secondary | ICD-10-CM | POA: Diagnosis not present

## 2017-04-07 NOTE — Progress Notes (Signed)
Subjective:    Patient ID: Kristy PinaNancy [name], female    DOB: 1941-03-14, 76 y.o.   MRN: [id number]  HPI  76 year old patient who has a history of essential hypertension.  She has asthma. She has done quite well over the past several months.  She was treated for community-acquired pneumonia earlier in the year She remains quite active without concerns or complaints  Past Medical History:  Diagnosis Date  . ALLERGIC RHINITIS 04/07/2007  . ASTHMA 04/07/2007  . COLONIC POLYPS, HX OF 04/07/2007  . GERD 04/07/2007  . HYPERTENSION 04/07/2007  . UNSPECIFIED DISORDER TEETH&SUPPORTING STRUCTURES 09/09/2010     Social History   Social History  . Marital status: Married    Spouse name: N/A  . Number of children: N/A  . Years of education: N/A   Occupational History  . Not on file.   Social History Main Topics  . Smoking status: Former Smoker    Quit date: 10/19/1988  . Smokeless tobacco: Never Used  . Alcohol use 0.0 oz/week  . Drug use: Unknown  . Sexual activity: Not on file   Other Topics Concern  . Not on file   Social History Narrative  . No narrative on file    Past Surgical History:  Procedure Laterality Date  . ABDOMINAL HYSTERECTOMY    . CHOLECYSTECTOMY    . LAMINECTOMY      No family history on file.  Allergies  Allergen Reactions  . Aspirin Itching  . Clonidine Hives    Current Outpatient Prescriptions on File Prior to Visit  Medication Sig Dispense Refill  . ADVAIR DISKUS 250-50 MCG/DOSE AEPB INHALE 1 PUFF INTO THE LUNGS EVERY 12 (TWELVE) HOURS FOR SHORTNESS OF BREATH AND WHEEZING 60 each 1  . albuterol (PROVENTIL HFA;VENTOLIN HFA) 108 (90 BASE) MCG/ACT inhaler Inhale 2 puffs into the lungs every 6 (six) hours as needed for wheezing. 1 Inhaler 2  . benazepril (LOTENSIN) 40 MG tablet TAKE 1 TABLET (40 MG TOTAL) BY MOUTH DAILY. 90 tablet 0  . fluticasone (FLONASE) 50 MCG/ACT nasal spray Place 2 sprays into both nostrils daily. 16 g 6  . hydrochlorothiazide  (HYDRODIURIL) 25 MG tablet TAKE 1 TABLET BY MOUTH EVERY DAY 90 tablet 3  . HYDROcodone-acetaminophen (NORCO/VICODIN) 5-325 MG tablet Take 1 tablet by mouth every 6 (six) hours as needed for moderate pain. 30 tablet 0  . traMADol (ULTRAM) 50 MG tablet TAKE 1 TABLET BY MOUTH EVERY 6 HOURS AS NEEDED 90 tablet 2  . trolamine salicylate (ASPERCREME) 10 % cream Apply topically as needed. pain      No current facility-administered medications on file prior to visit.     BP 138/82 (BP Location: Left Arm, Patient Position: Sitting, Cuff Size: Normal)   Pulse 76   Temp 97.8 F (36.6 C) (Oral)   Wt 164 lb 3.2 oz (74.5 kg)   SpO2 96%   BMI 31.54 kg/m     Review of Systems  Constitutional: Negative.   HENT: Negative for congestion, dental problem, hearing loss, rhinorrhea, sinus pressure, sore throat and tinnitus.   Eyes: Negative for pain, discharge and visual disturbance.  Respiratory: Negative for cough and shortness of breath.   Cardiovascular: Negative for chest pain, palpitations and leg swelling.  Gastrointestinal: Negative for abdominal distention, abdominal pain, blood in stool, constipation, diarrhea, nausea and vomiting.  Genitourinary: Negative for difficulty urinating, dysuria, flank pain, frequency, hematuria, pelvic pain, urgency, vaginal bleeding, vaginal discharge and vaginal pain.  Musculoskeletal: Negative for arthralgias, gait problem and  joint swelling.  Skin: Negative for rash.  Neurological: Negative for dizziness, syncope, speech difficulty, weakness, numbness and headaches.  Hematological: Negative for adenopathy.  Psychiatric/Behavioral: Negative for agitation, behavioral problems and dysphoric mood. The patient is not nervous/anxious.        Objective:   Physical Exam  Constitutional: She is oriented to person, place, and time. She appears well-developed and well-nourished.  HENT:  Head: Normocephalic.  Right Ear: External ear normal.  Left Ear: External ear  normal.  Mouth/Throat: Oropharynx is clear and moist.  Eyes: Conjunctivae and EOM are normal. Pupils are equal, round, and reactive to light.  Neck: Normal range of motion. Neck supple. No thyromegaly present.  Cardiovascular: Normal rate, regular rhythm, normal heart sounds and intact distal pulses.   Pulmonary/Chest: Effort normal and breath sounds normal.  Abdominal: Soft. Bowel sounds are normal. She exhibits no mass. There is no tenderness.  Musculoskeletal: Normal range of motion.  Lymphadenopathy:    She has no cervical adenopathy.  Neurological: She is alert and oriented to person, place, and time.  Skin: Skin is warm and dry. No rash noted.  Psychiatric: She has a normal mood and affect. Her behavior is normal.          Assessment & Plan:   Essential hypertension, well-controlled Asthma, stable  No change in medical regimen CPX 6 months  KWIATKOWSKI,PETER Homero Fellers

## 2017-04-07 NOTE — Patient Instructions (Signed)
Limit your sodium (Salt) intake  Please check your blood pressure on a regular basis.  If it is consistently greater than 150/90, please make an office appointment.    It is important that you exercise regularly, at least 20 minutes 3 to 4 times per week.  If you develop chest pain or shortness of breath seek  medical attention.  Return in 6 months for follow-up  

## 2017-04-13 ENCOUNTER — Other Ambulatory Visit: Payer: Self-pay | Admitting: Family Medicine

## 2017-04-13 MED ORDER — CELECOXIB 200 MG PO CAPS: 200.0000 mg | ORAL_CAPSULE | Freq: Every day | ORAL | 0 refills | Status: DC

## 2017-04-13 NOTE — Telephone Encounter (Signed)
Pt now seen in the office on 04/07/17.  Medication was denied before she made appointment.

## 2017-06-19 ENCOUNTER — Other Ambulatory Visit: Payer: Self-pay | Admitting: Internal Medicine

## 2017-07-08 ENCOUNTER — Encounter: Payer: Self-pay | Admitting: Internal Medicine

## 2017-07-12 ENCOUNTER — Other Ambulatory Visit: Payer: Self-pay | Admitting: Internal Medicine

## 2017-07-30 DIAGNOSIS — Z803 Family history of malignant neoplasm of breast: Secondary | ICD-10-CM | POA: Diagnosis not present

## 2017-07-30 DIAGNOSIS — Z1231 Encounter for screening mammogram for malignant neoplasm of breast: Secondary | ICD-10-CM | POA: Diagnosis not present

## 2017-07-30 LAB — HM MAMMOGRAPHY

## 2017-08-04 ENCOUNTER — Encounter: Payer: Self-pay | Admitting: Internal Medicine

## 2017-08-17 ENCOUNTER — Other Ambulatory Visit: Payer: Self-pay | Admitting: Internal Medicine

## 2017-08-18 ENCOUNTER — Other Ambulatory Visit: Payer: Self-pay | Admitting: Internal Medicine

## 2017-08-26 DIAGNOSIS — M2550 Pain in unspecified joint: Secondary | ICD-10-CM | POA: Diagnosis not present

## 2017-08-26 DIAGNOSIS — Z79899 Other long term (current) drug therapy: Secondary | ICD-10-CM | POA: Diagnosis not present

## 2017-08-26 DIAGNOSIS — E669 Obesity, unspecified: Secondary | ICD-10-CM | POA: Diagnosis not present

## 2017-08-26 DIAGNOSIS — Z6830 Body mass index (BMI) 30.0-30.9, adult: Secondary | ICD-10-CM | POA: Diagnosis not present

## 2017-08-26 DIAGNOSIS — M150 Primary generalized (osteo)arthritis: Secondary | ICD-10-CM | POA: Diagnosis not present

## 2017-08-26 DIAGNOSIS — J40 Bronchitis, not specified as acute or chronic: Secondary | ICD-10-CM | POA: Diagnosis not present

## 2017-08-26 DIAGNOSIS — M154 Erosive (osteo)arthritis: Secondary | ICD-10-CM | POA: Diagnosis not present

## 2017-09-03 ENCOUNTER — Ambulatory Visit (INDEPENDENT_AMBULATORY_CARE_PROVIDER_SITE_OTHER): Payer: Medicare Other | Admitting: Family Medicine

## 2017-09-03 ENCOUNTER — Encounter: Payer: Self-pay | Admitting: Family Medicine

## 2017-09-03 VITALS — BP 128/84 | HR 80 | Temp 97.7°F | Resp 16 | Ht 60.5 in | Wt 165.0 lb

## 2017-09-03 DIAGNOSIS — I499 Cardiac arrhythmia, unspecified: Secondary | ICD-10-CM | POA: Diagnosis not present

## 2017-09-03 DIAGNOSIS — R0609 Other forms of dyspnea: Secondary | ICD-10-CM

## 2017-09-03 DIAGNOSIS — J4521 Mild intermittent asthma with (acute) exacerbation: Secondary | ICD-10-CM | POA: Diagnosis not present

## 2017-09-03 DIAGNOSIS — M549 Dorsalgia, unspecified: Secondary | ICD-10-CM | POA: Diagnosis not present

## 2017-09-03 MED ORDER — PREDNISONE 20 MG PO TABS: 40.0000 mg | ORAL_TABLET | Freq: Every day | ORAL | 0 refills | Status: AC

## 2017-09-03 MED ORDER — DOXYCYCLINE HYCLATE 100 MG PO TABS: 100.0000 mg | ORAL_TABLET | Freq: Two times a day (BID) | ORAL | 0 refills | Status: AC

## 2017-09-03 NOTE — Progress Notes (Signed)
ACUTE VISIT  HPI:  Chief Complaint  Patient presents with  . Bronchitis    [name] is a 76 y.o.female here today complaining of 2 weeks of respiratory symptoms. According to patient, she has similar symptoms once or twice per year, reporting history of "chronic bronchitis."  Productive cough with yellowish sputum.  Cough  This is a recurrent problem. The current episode started 1 to 4 weeks ago. The problem has been unchanged. The cough is productive of sputum. Associated symptoms include postnasal drip, rhinorrhea, shortness of breath and wheezing. Pertinent negatives include no ear congestion, ear pain, eye redness, fever, headaches, heartburn, hemoptysis, myalgias, rash, sore throat or sweats. The symptoms are aggravated by exercise. Risk factors for lung disease include smoking/tobacco exposure. She has tried a beta-agonist inhaler for the symptoms. Her past medical history is significant for environmental allergies.    Right upper back pain for the past few days, exacerbated by deep breathing. She denies recent injury. Hx of back pain but this pain is different. She has not noted rash,numbness,or tingling.   No Hx of recent travel. No sick contact. No known insect bite.  Hx of allergies: Allergic rhinitis on Flonase nasal spray and per records asthma. Former smoker.  OTC medications for this problem: None. She is on Advair and Albuterol inh, the latter one use about 2 hours ago.  Symptoms otherwise stable.   Today noted irregular HR, She has not noted palpitations. States that occasionally she has sharp chest pain upon walking, she has not had this recently.  No Hx of CVD.   Review of Systems  Constitutional: Positive for fatigue. Negative for activity change, appetite change and fever.  HENT: Positive for congestion, postnasal drip, rhinorrhea and trouble swallowing. Negative for ear pain, mouth sores, sinus pressure, sore throat and voice change.    Eyes: Negative for discharge, redness and itching.  Respiratory: Positive for cough, shortness of breath and wheezing. Negative for hemoptysis and chest tightness.   Cardiovascular: Negative for palpitations and leg swelling.  Gastrointestinal: Negative for abdominal pain, diarrhea, heartburn, nausea and vomiting.  Musculoskeletal: Positive for back pain. Negative for myalgias.  Skin: Negative for pallor and rash.  Allergic/Immunologic: Positive for environmental allergies.  Neurological: Negative for syncope, weakness and headaches.  Hematological: Negative for adenopathy. Does not bruise/bleed easily.  Psychiatric/Behavioral: Negative for confusion. The patient is not nervous/anxious.       Current Outpatient Medications on File Prior to Visit  Medication Sig Dispense Refill  . ADVAIR DISKUS 250-50 MCG/DOSE AEPB INHALE 1 PUFF INTO THE LUNGS EVERY 12 (TWELVE) HOURS FOR SHORTNESS OF BREATH AND WHEEZING 60 each 1  . albuterol (PROVENTIL HFA;VENTOLIN HFA) 108 (90 Base) MCG/ACT inhaler Inhale 2 puffs into the lungs every 6 (six) hours as needed for wheezing. 1 each 0  . benazepril (LOTENSIN) 40 MG tablet TAKE 1 TABLET (40 MG TOTAL) BY MOUTH DAILY. 90 tablet 0  . celecoxib (CELEBREX) 200 MG capsule TAKE 1 CAPSULE BY MOUTH EVERY DAY 90 capsule 0  . fluticasone (FLONASE) 50 MCG/ACT nasal spray Place 2 sprays into both nostrils daily. 16 g 6  . hydrochlorothiazide (HYDRODIURIL) 25 MG tablet TAKE 1 TABLET BY MOUTH EVERY DAY 90 tablet 3  . traMADol (ULTRAM) 50 MG tablet TAKE 1 TABLET BY MOUTH EVERY 6 HOURS AS NEEDED 90 tablet 2  . trolamine salicylate (ASPERCREME) 10 % cream Apply topically as needed. pain      No current facility-administered medications on file prior  to visit.      Past Medical History:  Diagnosis Date  . ALLERGIC RHINITIS 04/07/2007  . ASTHMA 04/07/2007  . COLONIC POLYPS, HX OF 04/07/2007  . GERD 04/07/2007  . HYPERTENSION 04/07/2007  . UNSPECIFIED DISORDER  TEETH&SUPPORTING STRUCTURES 09/09/2010   Allergies  Allergen Reactions  . Aspirin Itching  . Clonidine Hives    Social History   Socioeconomic History  . Marital status: Married    Spouse name: None  . Number of children: None  . Years of education: None  . Highest education level: None  Social Needs  . Financial resource strain: None  . Food insecurity - worry: None  . Food insecurity - inability: None  . Transportation needs - medical: None  . Transportation needs - non-medical: None  Occupational History  . None  Tobacco Use  . Smoking status: Former Smoker    Last attempt to quit: 10/19/1988    Years since quitting: 28.8  . Smokeless tobacco: Never Used  Substance and Sexual Activity  . Alcohol use: Yes    Alcohol/week: 0.0 oz  . Drug use: None  . Sexual activity: None  Other Topics Concern  . None  Social History Narrative  . None    Vitals:   09/03/17 1500  BP: 128/84  Pulse: 80  Resp: 16  Temp: 97.7 F (36.5 C)  SpO2: 98%   Body mass index is 31.69 kg/m.   Physical Exam  Nursing note and vitals reviewed. Constitutional: She is oriented to person, place, and time. She appears well-developed. She does not appear ill. No distress.  HENT:  Head: Normocephalic and atraumatic.  Nose: Rhinorrhea present.  Mouth/Throat: Oropharynx is clear and moist and mucous membranes are normal.  Dysphonic. Post nasal drainage.   Eyes: Conjunctivae are normal.  Cardiovascular: Normal rate. An irregular rhythm present.  Occasional extrasystoles are present.  No murmur heard. Respiratory: Effort normal. No respiratory distress. She has wheezes. She has no rales.  Musculoskeletal: She exhibits no edema.       Thoracic back: She exhibits tenderness. She exhibits no bony tenderness.  Tenderness upon palpation of right interscapular muscles.  Lymphadenopathy:    She has no cervical adenopathy.  Neurological: She is alert and oriented to person, place, and time. She  has normal strength.  Skin: Skin is warm. No rash noted. No erythema.  Psychiatric: She has a normal mood and affect. Her speech is normal.  Well groomed, good eye contact.    ASSESSMENT AND PLAN:   [name] was seen today for bronchitis.  Diagnoses and all orders for this visit:  Exertional dyspnea  We discussed possible etiologies, most likely related to asthma/? COPD. She was clearly instructed about warning signs. Follow-up with PCP in 7-10 days.  Irregular heart rate  Mild. We discussed possible causes. EKG today shows sinus arrhythmia, normal axis and intervals.  No signs of acute ischemia.? LAE.  No significant differences when compared with EKG in 03/2013. EKG 03/2012 rate variation. Instructed about warning signs. Follow with PCP if needed.  -     EKG 12-Lead  Mild intermittent asthma with acute exacerbation  Wheezing improved with DuoNeb treatment here in the office, which she tolerated well. I do not appreciate rhonchi or rales.  Because she has had symptoms for about 2 weeks, prescription for Doxycycline was sent to the pharmacy to start in 2-3 days if not any better or if she feels worse. We discussed some side effects.  She was instructed to  continue with Albuterol inh 2 puff every 6 hours for a week then as needed for wheezing or shortness of breath.  No changes on Advair. She has taken Prednisone in the past and has tolerated it well, some side effects discussed. Instructed about warning signs. Follow-up with PCP in 7-10 days.  -     predniSONE (DELTASONE) 20 MG tablet; Take 2 tablets (40 mg total) daily with breakfast for 3 days by mouth. -     doxycycline (VIBRA-TABS) 100 MG tablet; Take 1 tablet (100 mg total) 2 (two) times daily for 7 days by mouth.  Upper back pain on right side  Most likely muscle related. Recommend topical OTC icy hot/Aspercreme. Follow-up with PCP as needed.    -[name] advised to seek attention immediately if symptoms  worsen or to follow if they persist or new concerns arise.       Kristy [name] G. [place], MD  [facility]. [place] office.

## 2017-09-03 NOTE — Patient Instructions (Addendum)
  [name] I have seen you today for an acute visit.  A few things to remember from today's visit:   Exertional dyspnea  Irregular heart rate - Plan: EKG 12-Lead  Mild intermittent asthma with acute exacerbation - Plan: predniSONE (DELTASONE) 20 MG tablet, doxycycline (VIBRA-TABS) 100 MG tablet  You could hold on antibiotic and start in 2-3 days if not any better.  Medications prescribed today are intended for short period of time and will not be refill upon request, a follow up appointment might be necessary to discuss continuation of of treatment if appropriate.   Albuterol inh 2 puff every 6 hours for a week then as needed for wheezing or shortness of breath.    In general please monitor for signs of worsening symptoms and seek immediate medical attention if any concerning.    I hope you get better soon!

## 2017-09-14 ENCOUNTER — Ambulatory Visit (INDEPENDENT_AMBULATORY_CARE_PROVIDER_SITE_OTHER): Payer: Medicare Other | Admitting: Internal Medicine

## 2017-09-14 ENCOUNTER — Encounter: Payer: Self-pay | Admitting: Internal Medicine

## 2017-09-14 VITALS — BP 142/70 | HR 90 | Temp 97.8°F | Ht 60.0 in | Wt 166.0 lb

## 2017-09-14 DIAGNOSIS — J4520 Mild intermittent asthma, uncomplicated: Secondary | ICD-10-CM

## 2017-09-14 DIAGNOSIS — M150 Primary generalized (osteo)arthritis: Secondary | ICD-10-CM | POA: Diagnosis not present

## 2017-09-14 DIAGNOSIS — I10 Essential (primary) hypertension: Secondary | ICD-10-CM

## 2017-09-14 DIAGNOSIS — Z23 Encounter for immunization: Secondary | ICD-10-CM

## 2017-09-14 DIAGNOSIS — M159 Polyosteoarthritis, unspecified: Secondary | ICD-10-CM

## 2017-09-14 NOTE — Progress Notes (Signed)
Subjective:    Patient ID: Kristy PinaNancy [name], female    DOB: 07-12-1941, 76 y.o.   MRN: [id number]  HPI  76 year old patient has essential hypertension.  She also has a history of allergic rhinitis and asthma.  She was seen and evaluated 1 week ago with URI with asthma exacerbation.  She was treated with short-term steroids and azithromycin and has nicely improved. His medications include Advair and albuterol rescue inhaler. Today she feels quite well ; she is scheduled for her annual exam next month  Past Medical History:  Diagnosis Date  . ALLERGIC RHINITIS 04/07/2007  . ASTHMA 04/07/2007  . COLONIC POLYPS, HX OF 04/07/2007  . GERD 04/07/2007  . HYPERTENSION 04/07/2007  . UNSPECIFIED DISORDER TEETH&SUPPORTING STRUCTURES 09/09/2010     Social History   Socioeconomic History  . Marital status: Married    Spouse name: Not on file  . Number of children: Not on file  . Years of education: Not on file  . Highest education level: Not on file  Social Needs  . Financial resource strain: Not on file  . Food insecurity - worry: Not on file  . Food insecurity - inability: Not on file  . Transportation needs - medical: Not on file  . Transportation needs - non-medical: Not on file  Occupational History  . Not on file  Tobacco Use  . Smoking status: Former Smoker    Last attempt to quit: 10/19/1988    Years since quitting: 28.9  . Smokeless tobacco: Never Used  Substance and Sexual Activity  . Alcohol use: Yes    Alcohol/week: 0.0 oz  . Drug use: Not on file  . Sexual activity: Not on file  Other Topics Concern  . Not on file  Social History Narrative  . Not on file    Past Surgical History:  Procedure Laterality Date  . ABDOMINAL HYSTERECTOMY    . CHOLECYSTECTOMY    . LAMINECTOMY      History reviewed. No pertinent family history.  Allergies  Allergen Reactions  . Aspirin Itching  . Clonidine Hives    Current Outpatient Medications on File Prior to Visit  Medication Sig  Dispense Refill  . ADVAIR DISKUS 250-50 MCG/DOSE AEPB INHALE 1 PUFF INTO THE LUNGS EVERY 12 (TWELVE) HOURS FOR SHORTNESS OF BREATH AND WHEEZING 60 each 1  . albuterol (PROVENTIL HFA;VENTOLIN HFA) 108 (90 Base) MCG/ACT inhaler Inhale 2 puffs into the lungs every 6 (six) hours as needed for wheezing. 1 each 0  . benazepril (LOTENSIN) 40 MG tablet TAKE 1 TABLET (40 MG TOTAL) BY MOUTH DAILY. 90 tablet 0  . celecoxib (CELEBREX) 200 MG capsule TAKE 1 CAPSULE BY MOUTH EVERY DAY 90 capsule 0  . fluticasone (FLONASE) 50 MCG/ACT nasal spray Place 2 sprays into both nostrils daily. 16 g 6  . hydrochlorothiazide (HYDRODIURIL) 25 MG tablet TAKE 1 TABLET BY MOUTH EVERY DAY 90 tablet 3  . traMADol (ULTRAM) 50 MG tablet TAKE 1 TABLET BY MOUTH EVERY 6 HOURS AS NEEDED 90 tablet 2  . trolamine salicylate (ASPERCREME) 10 % cream Apply topically as needed. pain      No current facility-administered medications on file prior to visit.     BP (!) 142/70 (BP Location: Left Arm, Patient Position: Sitting, Cuff Size: Normal)   Pulse 90   Temp 97.8 F (36.6 C) (Oral)   Ht 5' (1.524 m)   Wt 166 lb (75.3 kg)   SpO2 96%   BMI 32.42 kg/m  Review of Systems  Constitutional: Negative.   HENT: Negative for congestion, dental problem, hearing loss, rhinorrhea, sinus pressure, sore throat and tinnitus.   Eyes: Negative for pain, discharge and visual disturbance.  Respiratory: Positive for cough and wheezing. Negative for shortness of breath.   Cardiovascular: Negative for chest pain, palpitations and leg swelling.  Gastrointestinal: Negative for abdominal distention, abdominal pain, blood in stool, constipation, diarrhea, nausea and vomiting.  Genitourinary: Negative for difficulty urinating, dysuria, flank pain, frequency, hematuria, pelvic pain, urgency, vaginal bleeding, vaginal discharge and vaginal pain.  Musculoskeletal: Negative for arthralgias, gait problem and joint swelling.  Skin: Negative for rash.    Neurological: Negative for dizziness, syncope, speech difficulty, weakness, numbness and headaches.  Hematological: Negative for adenopathy.  Psychiatric/Behavioral: Negative for agitation, behavioral problems and dysphoric mood. The patient is not nervous/anxious.        Objective:   Physical Exam  Constitutional: She is oriented to person, place, and time. She appears well-developed and well-nourished. No distress.  HENT:  Head: Normocephalic.  Right Ear: External ear normal.  Left Ear: External ear normal.  Mouth/Throat: Oropharynx is clear and moist.  Eyes: Conjunctivae and EOM are normal. Pupils are equal, round, and reactive to light.  Neck: Normal range of motion. Neck supple. No thyromegaly present.  Cardiovascular: Normal rate, regular rhythm, normal heart sounds and intact distal pulses.  Pulmonary/Chest: Effort normal and breath sounds normal. No respiratory distress. She has no wheezes. She has no rales.  Abdominal: Soft. Bowel sounds are normal. She exhibits no mass. There is no tenderness.  Musculoskeletal: Normal range of motion.  Lymphadenopathy:    She has no cervical adenopathy.  Neurological: She is alert and oriented to person, place, and time.  Skin: Skin is warm and dry. No rash noted.  Psychiatric: She has a normal mood and affect. Her behavior is normal.          Assessment & Plan:   Asthma exacerbation.  Resolved Essential hypertension stable  No change in medical regimen Annual exam as scheduled  Kristy [name],Kristy [name]

## 2017-10-01 ENCOUNTER — Other Ambulatory Visit: Payer: Self-pay | Admitting: Internal Medicine

## 2017-10-06 ENCOUNTER — Ambulatory Visit: Payer: Medicare Other | Admitting: Internal Medicine

## 2017-10-08 ENCOUNTER — Other Ambulatory Visit: Payer: Self-pay | Admitting: Internal Medicine

## 2017-10-08 MED ORDER — CELECOXIB 200 MG PO CAPS: ORAL_CAPSULE | ORAL | 1 refills | Status: DC

## 2017-10-30 ENCOUNTER — Other Ambulatory Visit: Payer: Self-pay | Admitting: Internal Medicine

## 2017-12-01 ENCOUNTER — Other Ambulatory Visit: Payer: Self-pay | Admitting: Internal Medicine

## 2017-12-07 DIAGNOSIS — H2513 Age-related nuclear cataract, bilateral: Secondary | ICD-10-CM | POA: Diagnosis not present

## 2017-12-07 DIAGNOSIS — H524 Presbyopia: Secondary | ICD-10-CM | POA: Diagnosis not present

## 2017-12-07 NOTE — Telephone Encounter (Signed)
Rx printed, to PCP for signature. 

## 2017-12-07 NOTE — Telephone Encounter (Signed)
Okay for refill?  

## 2017-12-07 NOTE — Telephone Encounter (Signed)
Rx faxed to pharmacy. Fax confirmation received. 

## 2017-12-07 NOTE — Telephone Encounter (Signed)
Last OV: 09/14/17 Last filled: 03/24/17, #90, 2 RF Sig: TAKE 1 TABLET BY MOUTH EVERY 6 HOURS AS NEEDED UDS: Not on file

## 2018-01-10 ENCOUNTER — Encounter: Payer: Self-pay | Admitting: Internal Medicine

## 2018-01-10 ENCOUNTER — Ambulatory Visit (INDEPENDENT_AMBULATORY_CARE_PROVIDER_SITE_OTHER): Payer: Medicare Other | Admitting: Internal Medicine

## 2018-01-10 VITALS — BP 118/76 | HR 77 | Temp 97.6°F | Ht 60.0 in | Wt 163.2 lb

## 2018-01-10 DIAGNOSIS — M150 Primary generalized (osteo)arthritis: Secondary | ICD-10-CM | POA: Diagnosis not present

## 2018-01-10 DIAGNOSIS — J4520 Mild intermittent asthma, uncomplicated: Secondary | ICD-10-CM

## 2018-01-10 DIAGNOSIS — Z0000 Encounter for general adult medical examination without abnormal findings: Secondary | ICD-10-CM

## 2018-01-10 DIAGNOSIS — Z86010 Personal history of colonic polyps: Secondary | ICD-10-CM

## 2018-01-10 DIAGNOSIS — M159 Polyosteoarthritis, unspecified: Secondary | ICD-10-CM

## 2018-01-10 DIAGNOSIS — I10 Essential (primary) hypertension: Secondary | ICD-10-CM | POA: Diagnosis not present

## 2018-01-10 LAB — CBC WITH DIFFERENTIAL/PLATELET
BASOS ABS: 0 10*3/uL (ref 0.0–0.1)
Basophils Relative: 0.7 % (ref 0.0–3.0)
EOS ABS: 0.2 10*3/uL (ref 0.0–0.7)
Eosinophils Relative: 3.1 % (ref 0.0–5.0)
HCT: 39.4 % (ref 36.0–46.0)
Hemoglobin: 13.4 g/dL (ref 12.0–15.0)
LYMPHS ABS: 2.4 10*3/uL (ref 0.7–4.0)
Lymphocytes Relative: 37.1 % (ref 12.0–46.0)
MCHC: 33.9 g/dL (ref 30.0–36.0)
MCV: 91.6 fl (ref 78.0–100.0)
MONOS PCT: 7.3 % (ref 3.0–12.0)
Monocytes Absolute: 0.5 10*3/uL (ref 0.1–1.0)
NEUTROS ABS: 3.3 10*3/uL (ref 1.4–7.7)
NEUTROS PCT: 51.8 % (ref 43.0–77.0)
PLATELETS: 303 10*3/uL (ref 150.0–400.0)
RBC: 4.31 Mil/uL (ref 3.87–5.11)
RDW: 13.7 % (ref 11.5–15.5)
WBC: 6.4 10*3/uL (ref 4.0–10.5)

## 2018-01-10 LAB — COMPREHENSIVE METABOLIC PANEL
ALK PHOS: 49 U/L (ref 39–117)
ALT: 14 U/L (ref 0–35)
AST: 13 U/L (ref 0–37)
Albumin: 4 g/dL (ref 3.5–5.2)
BILIRUBIN TOTAL: 0.5 mg/dL (ref 0.2–1.2)
BUN: 23 mg/dL (ref 6–23)
CALCIUM: 9.6 mg/dL (ref 8.4–10.5)
CO2: 32 meq/L (ref 19–32)
CREATININE: 0.69 mg/dL (ref 0.40–1.20)
Chloride: 101 mEq/L (ref 96–112)
GFR: 105.68 mL/min (ref >60.00)
GLUCOSE: 107 mg/dL — AB (ref 70–99)
Potassium: 3.8 mEq/L (ref 3.5–5.1)
Sodium: 140 mEq/L (ref 135–145)
TOTAL PROTEIN: 7.1 g/dL (ref 6.0–8.3)

## 2018-01-10 LAB — LIPID PANEL
CHOL/HDL RATIO: 3
Cholesterol: 174 mg/dL (ref 0–200)
HDL: 61.3 mg/dL (ref >39.00)
LDL Cholesterol: 94 mg/dL (ref 0–99)
NONHDL: 112.78
TRIGLYCERIDES: 92 mg/dL (ref 0.0–149.0)
VLDL: 18.4 mg/dL (ref 0.0–40.0)

## 2018-01-10 LAB — TSH: TSH: 2.31 u[IU]/mL (ref 0.35–4.50)

## 2018-01-10 NOTE — Patient Instructions (Signed)
Please check your blood pressure on a regular basis.  If it is consistently greater than 150/90, please make an office appointment.    It is important that you exercise regularly, at least 20 minutes 3 to 4 times per week.  If you develop chest pain or shortness of breath seek  medical attention.  Return in 6 months for follow-up  Schedule your colonoscopy to help detect colon cancer.

## 2018-01-10 NOTE — Progress Notes (Signed)
Subjective:    Patient ID: Kristy [name], female    DOB: 09/29/1941, 77 y.o.   MRN: [id number]  HPI  77 year old patient who is seen today for a preventive health examination as well as subsequent Medicare wellness visit.  She has a history of hypertension as well as chronic stable asthma.  She is followed by rheumatology.  She also has a history of colonic polyps.  Her last colonoscopy was 2012 and a 5-year follow-up was suggested.  No change in her bowel habits. Other than significant arthritic pain she is doing quite well.  Does have considerable situational stress due to an elderly mother as well as the poor health of her disabled husband.  Her asthma has been stable  Past Medical History:  Diagnosis Date  . ALLERGIC RHINITIS 04/07/2007  . ASTHMA 04/07/2007  . COLONIC POLYPS, HX OF 04/07/2007  . GERD 04/07/2007  . HYPERTENSION 04/07/2007  . UNSPECIFIED DISORDER TEETH&SUPPORTING STRUCTURES 09/09/2010     Social History   Socioeconomic History  . Marital status: Married    Spouse name: Not on file  . Number of children: Not on file  . Years of education: Not on file  . Highest education level: Not on file  Occupational History  . Not on file  Social Needs  . Financial resource strain: Not on file  . Food insecurity:    Worry: Not on file    Inability: Not on file  . Transportation needs:    Medical: Not on file    Non-medical: Not on file  Tobacco Use  . Smoking status: Former Smoker    Last attempt to quit: 10/19/1988    Years since quitting: 29.2  . Smokeless tobacco: Never Used  Substance and Sexual Activity  . Alcohol use: Yes    Alcohol/week: 0.0 oz  . Drug use: Not on file  . Sexual activity: Not on file  Lifestyle  . Physical activity:    Days per week: Not on file    Minutes per session: Not on file  . Stress: Not on file  Relationships  . Social connections:    Talks on phone: Not on file    Gets together: Not on file    Attends religious service: Not on  file    Active member of club or organization: Not on file    Attends meetings of clubs or organizations: Not on file    Relationship status: Not on file  . Intimate partner violence:    Fear of current or ex partner: Not on file    Emotionally abused: Not on file    Physically abused: Not on file    Forced sexual activity: Not on file  Other Topics Concern  . Not on file  Social History Narrative  . Not on file    Past Surgical History:  Procedure Laterality Date  . ABDOMINAL HYSTERECTOMY    . CHOLECYSTECTOMY    . LAMINECTOMY      History reviewed. No pertinent family history.  Allergies  Allergen Reactions  . Aspirin Itching  . Clonidine Hives    Current Outpatient Medications on File Prior to Visit  Medication Sig Dispense Refill  . ADVAIR DISKUS 250-50 MCG/DOSE AEPB INHALE 1 PUFF INTO THE LUNGS EVERY 12 (TWELVE) HOURS FOR SHORTNESS OF BREATH AND WHEEZING 60 each 1  . albuterol (PROVENTIL HFA;VENTOLIN HFA) 108 (90 Base) MCG/ACT inhaler Inhale 2 puffs into the lungs every 6 (six) hours as needed for wheezing. 1 each 0  .  benazepril (LOTENSIN) 40 MG tablet TAKE 1 TABLET (40 MG TOTAL) BY MOUTH DAILY. 90 tablet 0  . celecoxib (CELEBREX) 200 MG capsule TAKE 1 CAPSULE BY MOUTH EVERY DAY 90 capsule 1  . fluticasone (FLONASE) 50 MCG/ACT nasal spray Place 2 sprays into both nostrils daily. 16 g 6  . hydrochlorothiazide (HYDRODIURIL) 25 MG tablet TAKE 1 TABLET BY MOUTH EVERY DAY 90 tablet 3  . traMADol (ULTRAM) 50 MG tablet TAKE 1 TABLET BY MOUTH EVERY 6 HOURS 90 tablet 0  . trolamine salicylate (ASPERCREME) 10 % cream Apply topically as needed. pain      No current facility-administered medications on file prior to visit.     BP 118/76 (BP Location: Right Arm, Patient Position: Sitting, Cuff Size: Large)   Pulse 77   Temp 97.6 F (36.4 C) (Oral)   Ht 5' (1.524 m)   Wt 163 lb 3.2 oz (74 kg)   SpO2 96%   BMI 31.87 kg/m   Subsequent Medicare wellness visit  1. Risk  factors, based on past  M,S,F history.  Cardio vascular risk factors include hypertension.  2.  Physical activities:remains quite active, although does have some arthritic complaints.  She walks her dog twice daily and participates in yoga and dance activities and bowls at least twice weekly into bowling leagues  3.  Depression/mood:no history of major depression or mood disorder.  Situational stress due to poor health of her husband.  She also cares for her elderly mother who is age 92  4.  Hearing:no deficits  5.  ADL's:independent  6.  Fall risk:low  7.  Home safety:no problems identified  8.  Height weight, and visual acuity;height and weight stable no change in visual acuity.  Is followed by ophthalmology annually  9.  Counseling:continue active lifestyle and heart healthy diet  10. Lab orders based on risk factors:laboratory studies  will be updated   11. Referral :follow-up rheumatology.  Patient agreeable for follow-up colonoscopy  12. Care plan:continue efforts at aggressive risk factor modification.  Active lifestyle.  Heart healthy diet all encouraged  13. Cognitive assessment: alert in order with normal affect.  No cognitive dysfunction 14. Screening: Patient provided with a written and personalized 5-10 year screening schedule in the AVS.    15. Provider List Update: primary care.  Rheumatology and orthopedic as well as GI and ophthalmology     Review of Systems  Constitutional: Negative.   HENT: Negative for congestion, dental problem, hearing loss, rhinorrhea, sinus pressure, sore throat and tinnitus.   Eyes: Negative for pain, discharge and visual disturbance.  Respiratory: Negative for cough and shortness of breath.   Cardiovascular: Negative for chest pain, palpitations and leg swelling.  Gastrointestinal: Negative for abdominal distention, abdominal pain, blood in stool, constipation, diarrhea, nausea and vomiting.  Genitourinary: Negative for  difficulty urinating, dysuria, flank pain, frequency, hematuria, pelvic pain, urgency, vaginal bleeding, vaginal discharge and vaginal pain.  Musculoskeletal: Positive for arthralgias. Negative for gait problem and joint swelling.  Skin: Negative for rash.  Neurological: Negative for dizziness, syncope, speech difficulty, weakness, numbness and headaches.  Hematological: Negative for adenopathy.  Psychiatric/Behavioral: Positive for dysphoric mood. Negative for agitation and behavioral problems. The patient is not nervous/anxious.        Objective:   Physical Exam  Constitutional: She is oriented to person, place, and time. She appears well-developed and well-nourished.  HENT:  Head: Normocephalic and atraumatic.  Right Ear: External ear normal.  Left Ear: External ear normal.  Mouth/Throat: Oropharynx  is clear and moist.  Eyes: Conjunctivae and EOM are normal.  Neck: Normal range of motion. Neck supple. No JVD present. No thyromegaly present.  Cardiovascular: Normal rate, regular rhythm, normal heart sounds and intact distal pulses.  No murmur heard. Pulmonary/Chest: Effort normal and breath sounds normal. She has no wheezes. She has no rales.  Abdominal: Soft. Bowel sounds are normal. She exhibits no distension and no mass. There is no tenderness. There is no rebound and no guarding.  Right upper quadrant and lower midline scar  Genitourinary: Vagina normal.  Musculoskeletal: Normal range of motion. She exhibits no edema or tenderness.  Neurological: She is alert and oriented to person, place, and time. She has normal reflexes. No cranial nerve deficit. She exhibits normal muscle tone. Coordination normal.  Skin: Skin is warm and dry. No rash noted.  Psychiatric: She has a normal mood and affect. Her behavior is normal.          Assessment & Plan:   Essential hypertension.  Well controlled History of colonic polyps.  We will schedule follow-up colonoscopy History of asthma  stable Osteoarthritis.  Follow-up rheumatology.  Patient encouraged to use tramadol on a more regular basis Subsequent Medicare wellness visit  Follow-up 6 months  Grier Vu Homero Fellers

## 2018-01-22 ENCOUNTER — Other Ambulatory Visit: Payer: Self-pay | Admitting: Internal Medicine

## 2018-01-27 DIAGNOSIS — M7061 Trochanteric bursitis, right hip: Secondary | ICD-10-CM | POA: Diagnosis not present

## 2018-02-13 ENCOUNTER — Other Ambulatory Visit: Payer: Self-pay | Admitting: Internal Medicine

## 2018-02-23 DIAGNOSIS — Z6830 Body mass index (BMI) 30.0-30.9, adult: Secondary | ICD-10-CM | POA: Diagnosis not present

## 2018-02-23 DIAGNOSIS — Z79899 Other long term (current) drug therapy: Secondary | ICD-10-CM | POA: Diagnosis not present

## 2018-02-23 DIAGNOSIS — M154 Erosive (osteo)arthritis: Secondary | ICD-10-CM | POA: Diagnosis not present

## 2018-02-23 DIAGNOSIS — E669 Obesity, unspecified: Secondary | ICD-10-CM | POA: Diagnosis not present

## 2018-02-23 DIAGNOSIS — M2550 Pain in unspecified joint: Secondary | ICD-10-CM | POA: Diagnosis not present

## 2018-02-23 DIAGNOSIS — M150 Primary generalized (osteo)arthritis: Secondary | ICD-10-CM | POA: Diagnosis not present

## 2018-02-24 ENCOUNTER — Telehealth: Payer: Self-pay | Admitting: Internal Medicine

## 2018-03-03 NOTE — Telephone Encounter (Signed)
Patient states pharmacy did not receive request. Please advise

## 2018-03-04 NOTE — Telephone Encounter (Signed)
I have tried calling pt several time to check the status of her medication but both phone numbers have a full vm. Calling pharmacy to check.

## 2018-03-04 NOTE — Telephone Encounter (Signed)
Called pharmacy and their system was down. However pharmacist took a phone in and stated that the Rx wil be filled soon.

## 2018-03-10 ENCOUNTER — Other Ambulatory Visit: Payer: Self-pay

## 2018-03-10 ENCOUNTER — Ambulatory Visit (AMBULATORY_SURGERY_CENTER): Payer: Self-pay | Admitting: *Deleted

## 2018-03-10 VITALS — Ht 60.5 in | Wt 163.8 lb

## 2018-03-10 DIAGNOSIS — Z86010 Personal history of colonic polyps: Secondary | ICD-10-CM

## 2018-03-10 MED ORDER — NA SULFATE-K SULFATE-MG SULF 17.5-3.13-1.6 GM/177ML PO SOLN: 1.0000 | Freq: Once | ORAL | 0 refills | Status: AC

## 2018-03-10 NOTE — Progress Notes (Signed)
Denies allergies to eggs or soy products. Denies complications with sedation or anesthesia. Denies O2 use. Denies use of diet or weight loss medications.  Emmi instructions given for colonoscopy.  

## 2018-03-24 ENCOUNTER — Ambulatory Visit (AMBULATORY_SURGERY_CENTER): Payer: Medicare Other | Admitting: Internal Medicine

## 2018-03-24 ENCOUNTER — Other Ambulatory Visit: Payer: Self-pay

## 2018-03-24 ENCOUNTER — Encounter: Payer: Self-pay | Admitting: Internal Medicine

## 2018-03-24 VITALS — BP 134/82 | HR 74 | Temp 96.9°F | Resp 20 | Ht 60.0 in | Wt 163.0 lb

## 2018-03-24 DIAGNOSIS — I10 Essential (primary) hypertension: Secondary | ICD-10-CM | POA: Diagnosis not present

## 2018-03-24 DIAGNOSIS — J45909 Unspecified asthma, uncomplicated: Secondary | ICD-10-CM | POA: Diagnosis not present

## 2018-03-24 DIAGNOSIS — Z86010 Personal history of colonic polyps: Secondary | ICD-10-CM | POA: Diagnosis not present

## 2018-03-24 MED ORDER — SODIUM CHLORIDE 0.9 % IV SOLN: 500.0000 mL | Freq: Once | INTRAVENOUS | Status: DC

## 2018-03-24 NOTE — Progress Notes (Signed)
Report to PACU, RN, vss, BBS= Clear.  

## 2018-03-24 NOTE — Op Note (Signed)
[facility] Patient Name: Kristy [name] Procedure Date: 03/24/2018 9:48 AM MRN: [id number] Endoscopist: Wilhemina Bonito. Marina Goodell , MD Age: 77 Referring MD:  Date of Birth: 03/29/1941 Gender: Female Account #: [id number] Procedure:                Colonoscopy Indications:              High risk colon cancer surveillance: Personal                            history of non-advanced adenoma. Previous                            examinations 2006, 2012 (negative) Medicines:                Monitored Anesthesia Care Procedure:                Pre-Anesthesia Assessment:                           - Prior to the procedure, a History and Physical                            was performed, and patient medications and                            allergies were reviewed. The patient's tolerance of                            previous anesthesia was also reviewed. The risks                            and benefits of the procedure and the sedation                            options and risks were discussed with the patient.                            All questions were answered, and informed consent                            was obtained. Prior Anticoagulants: The patient has                            taken no previous anticoagulant or antiplatelet                            agents. ASA Grade Assessment: II - A patient with                            mild systemic disease. After reviewing the risks                            and benefits, the patient was deemed in  satisfactory condition to undergo the procedure.                           After obtaining informed consent, the colonoscope                            was passed under direct vision. Throughout the                            procedure, the patient's blood pressure, pulse, and                            oxygen saturations were monitored continuously. The                            Colonoscope was introduced through the  anus and                            advanced to the the cecum, identified by                            appendiceal orifice and ileocecal valve. The                            ileocecal valve, appendiceal orifice, and rectum                            were photographed. The quality of the bowel                            preparation was excellent. The colonoscopy was                            performed without difficulty. The patient tolerated                            the procedure well. The bowel preparation used was                            SUPREP. Scope In: 9:58:13 AM Scope Out: 10:12:07 AM Scope Withdrawal Time: 0 hours 8 minutes 24 seconds  Total Procedure Duration: 0 hours 13 minutes 54 seconds  Findings:                 Multiple medium-mouthed diverticula were found in                            the sigmoid colon.                           Internal hemorrhoids were found during retroflexion.                           The exam was otherwise without abnormality on  direct and retroflexion views. Complications:            No immediate complications. Estimated blood loss:                            None. Estimated Blood Loss:     Estimated blood loss: none. Impression:               - Diverticulosis in the sigmoid colon.                           - Internal hemorrhoids.                           - The examination was otherwise normal on direct                            and retroflexion views.                           - No specimens collected. Recommendation:           - Repeat colonoscopy is not recommended for                            surveillance.                           - Patient has a contact number available for                            emergencies. The signs and symptoms of potential                            delayed complications were discussed with the                            patient. Return to normal activities tomorrow.                             Written discharge instructions were provided to the                            patient.                           - Resume previous diet.                           - Continue present medications. Wilhemina Bonito. Marina Goodell, MD 03/24/2018 10:18:28 AM This report has been signed electronically.

## 2018-03-24 NOTE — Patient Instructions (Addendum)
YOU HAD AN ENDOSCOPIC PROCEDURE TODAY AT THE  ENDOSCOPY CENTER:   Refer to the procedure report that was given to you for any specific questions about what was found during the examination.  If the procedure report does not answer your questions, please call your gastroenterologist to clarify.  If you requested that your care partner not be given the details of your procedure findings, then the procedure report has been included in a sealed envelope for you to review at your convenience later.  YOU SHOULD EXPECT: Some feelings of bloating in the abdomen. Passage of more gas than usual.  Walking can help get rid of the air that was put into your GI tract during the procedure and reduce the bloating. If you had a lower endoscopy (such as a colonoscopy or flexible sigmoidoscopy) you may notice spotting of blood in your stool or on the toilet paper. If you underwent a bowel prep for your procedure, you may not have a normal bowel movement for a few days.  Please Note:  You might notice some irritation and congestion in your nose or some drainage.  This is from the oxygen used during your procedure.  There is no need for concern and it should clear up in a day or so.  SYMPTOMS TO REPORT IMMEDIATELY:   Following lower endoscopy (colonoscopy or flexible sigmoidoscopy):  Excessive amounts of blood in the stool  Significant tenderness or worsening of abdominal pains  Swelling of the abdomen that is new, acute  Fever of 100F or higher   Following upper endoscopy (EGD)  Vomiting of blood or coffee ground material  New chest pain or pain under the shoulder blades  Painful or persistently difficult swallowing  New shortness of breath  Fever of 100F or higher  Black, tarry-looking stools  For urgent or emergent issues, a gastroenterologist can be reached at any hour by calling [phone].   DIET:  We do recommend a small meal at first, but then you may proceed to your regular diet.  Drink  plenty of fluids but you should avoid alcoholic beverages for 24 hours.  ACTIVITY:  You should plan to take it easy for the rest of today and you should NOT DRIVE or use heavy machinery until tomorrow (because of the sedation medicines used during the test).    FOLLOW UP: Our staff will call the number listed on your records the next business day following your procedure to check on you and address any questions or concerns that you may have regarding the information given to you following your procedure. If we do not reach you, we will leave a message.  However, if you are feeling well and you are not experiencing any problems, there is no need to return our call.  We will assume that you have returned to your regular daily activities without incident.  No further Colonoscopy screening needed Diverticulosis (handout given) Hemorrhoids (handout given)   If any biopsies were taken you will be contacted by phone or by letter within the next 1-3 weeks.  Please call us at [phone] if you have not heard about the biopsies in 3 weeks.    SIGNATURES/CONFIDENTIALITY: You and/or your care partner have signed paperwork which will be entered into your electronic medical record.  These signatures attest to the fact that that the information above on your After Visit Summary has been reviewed and is understood.  Full responsibility of the confidentiality of this discharge information lies with you and/or your  care-partner

## 2018-03-24 NOTE — Progress Notes (Signed)
Pt's states no medical or surgical changes since previsit or office visit. 

## 2018-03-25 ENCOUNTER — Telehealth: Payer: Self-pay | Admitting: *Deleted

## 2018-03-25 NOTE — Telephone Encounter (Signed)
  Follow up Call-  Call back number 03/24/2018  Post procedure Call Back phone  # [phone]  Permission to leave phone message Yes  Some recent data might be hidden     Patient questions:  Do you have a fever, pain , or abdominal swelling? No. Pain Score  0 *  Have you tolerated food without any problems? Yes.    Have you been able to return to your normal activities? Yes.    Do you have any questions about your discharge instructions: Diet   No. Medications  No. Follow up visit  No.  Do you have questions or concerns about your Care? No.  Actions: * If pain score is 4 or above: No action needed, pain <4.

## 2018-04-09 ENCOUNTER — Other Ambulatory Visit: Payer: Self-pay | Admitting: Internal Medicine

## 2018-04-12 ENCOUNTER — Other Ambulatory Visit: Payer: Self-pay | Admitting: Internal Medicine

## 2018-04-13 NOTE — Telephone Encounter (Signed)
Last filled on 02/16/18 for 6 months with 30 day supply.  Request is for 90 day supply.  Sent to the pharmacy by e-scribe.

## 2018-04-23 ENCOUNTER — Other Ambulatory Visit: Payer: Self-pay | Admitting: Internal Medicine

## 2018-05-30 ENCOUNTER — Other Ambulatory Visit: Payer: Self-pay | Admitting: Internal Medicine

## 2018-06-01 NOTE — Telephone Encounter (Signed)
Dr. Kirtland BouchardK, please advise if ok to refill the tramadol.  Last OV was 01/10/2018 and last refill was 03/02/2018 for #90 tablets.  Thanks

## 2018-06-15 ENCOUNTER — Emergency Department (HOSPITAL_COMMUNITY)
Admission: EM | Admit: 2018-06-15 | Discharge: 2018-06-15 | Disposition: A | Discharge status: Home / Self Care | Payer: Medicare Other | Attending: Emergency Medicine | Admitting: Emergency Medicine

## 2018-06-15 ENCOUNTER — Encounter (HOSPITAL_COMMUNITY): Payer: Self-pay

## 2018-06-15 ENCOUNTER — Emergency Department (HOSPITAL_COMMUNITY): Payer: Medicare Other

## 2018-06-15 ENCOUNTER — Other Ambulatory Visit: Payer: Self-pay

## 2018-06-15 DIAGNOSIS — S82851A Displaced trimalleolar fracture of right lower leg, initial encounter for closed fracture: Secondary | ICD-10-CM | POA: Insufficient documentation

## 2018-06-15 DIAGNOSIS — W109XXA Fall (on) (from) unspecified stairs and steps, initial encounter: Secondary | ICD-10-CM | POA: Diagnosis not present

## 2018-06-15 DIAGNOSIS — Y939 Activity, unspecified: Secondary | ICD-10-CM | POA: Diagnosis not present

## 2018-06-15 DIAGNOSIS — Y998 Other external cause status: Secondary | ICD-10-CM | POA: Insufficient documentation

## 2018-06-15 DIAGNOSIS — W19XXXA Unspecified fall, initial encounter: Secondary | ICD-10-CM

## 2018-06-15 DIAGNOSIS — I10 Essential (primary) hypertension: Secondary | ICD-10-CM | POA: Diagnosis not present

## 2018-06-15 DIAGNOSIS — J45909 Unspecified asthma, uncomplicated: Secondary | ICD-10-CM | POA: Insufficient documentation

## 2018-06-15 DIAGNOSIS — Y929 Unspecified place or not applicable: Secondary | ICD-10-CM | POA: Diagnosis not present

## 2018-06-15 DIAGNOSIS — Z79899 Other long term (current) drug therapy: Secondary | ICD-10-CM | POA: Diagnosis not present

## 2018-06-15 DIAGNOSIS — Z87891 Personal history of nicotine dependence: Secondary | ICD-10-CM | POA: Diagnosis not present

## 2018-06-15 DIAGNOSIS — S99911A Unspecified injury of right ankle, initial encounter: Secondary | ICD-10-CM | POA: Diagnosis present

## 2018-06-15 DIAGNOSIS — R0902 Hypoxemia: Secondary | ICD-10-CM | POA: Diagnosis not present

## 2018-06-15 MED ORDER — ETOMIDATE 2 MG/ML IV SOLN
INTRAVENOUS | Status: AC | PRN
  Administered 2018-06-15: 5 mg via INTRAVENOUS

## 2018-06-15 MED ORDER — ETOMIDATE 2 MG/ML IV SOLN
10.0000 mg | Freq: Once | INTRAVENOUS | Status: DC
  Filled 2018-06-15: qty 10

## 2018-06-15 MED ORDER — ACETAMINOPHEN 325 MG PO TABS
650.0000 mg | ORAL_TABLET | Freq: Once | ORAL | Status: AC
  Administered 2018-06-15: 650 mg via ORAL
  Filled 2018-06-15: qty 2

## 2018-06-15 MED ORDER — ETOMIDATE 2 MG/ML IV SOLN: 0.3000 mg/kg | Freq: Once | INTRAVENOUS | Status: DC

## 2018-06-15 MED ORDER — ONDANSETRON HCL 4 MG/2ML IJ SOLN
4.0000 mg | Freq: Once | INTRAMUSCULAR | Status: AC
  Administered 2018-06-15: 4 mg via INTRAVENOUS
  Filled 2018-06-15: qty 2

## 2018-06-15 MED ORDER — OXYCODONE-ACETAMINOPHEN 5-325 MG PO TABS
1.0000 | ORAL_TABLET | Freq: Once | ORAL | Status: AC
  Administered 2018-06-15: 1 via ORAL
  Filled 2018-06-15: qty 1

## 2018-06-15 MED ORDER — FENTANYL CITRATE (PF) 100 MCG/2ML IJ SOLN
50.0000 ug | Freq: Once | INTRAMUSCULAR | Status: AC
  Administered 2018-06-15: 50 ug via INTRAVENOUS
  Filled 2018-06-15: qty 2

## 2018-06-15 MED ORDER — OXYCODONE HCL 5 MG PO CAPS: 5.0000 mg | ORAL_CAPSULE | ORAL | 0 refills | Status: AC | PRN

## 2018-06-15 NOTE — Discharge Instructions (Signed)
You have a significant right ankle fracture.   This was splinted and reduced.   Call Dr Eliberto IvoryBlackman's office as soon as possible tomorrow to schedule appropriate follow up.  DO NOT BEAR WEIGHT ON YOUR RIGHT LEG.  Elevate as much as possible. Ice. Take [phone] mg acetaminophen for mild, moderate pain.  Add oxycodone for break through or severe pain. Ice.   Oxycodone is a narcotic pain medication that has risk of overdose, death, dependence and abuse. Mild and expected side effects include nausea, stomach upset, drowsiness, constipation. Do not consume alcohol, drive or use heavy machinery while taking this medication. Do not leave unattended around children. Flush any remaining pills that you do not use and do not share.  The emergency department has a strict policy regarding prescription of narcotic medications. We prescribe a short course for acute, new pain or injuries. We are unable to refill this medication in the emergency department for chronic pain or repeatedly.  Refill need to be done by specialist or primary care provider or pain clinic.  Contact your primary care provider or specialist for chronic pain management and refill on narcotic medications.

## 2018-06-15 NOTE — ED Provider Notes (Signed)
[facility]-EMERGENCY DEPT Provider Note   CSN: [id number] Arrival date & time: 06/15/18  1347     History   Chief Complaint Chief Complaint  Patient presents with  . Ankle Injury    HPI Kristy [name] is a 77 y.o. female with history of hypertension, asthma, GERD is here for evaluation of sudden, severe, gradually worsening right ankle pain after mechanical fall.  Patient states she was just put lotion on her feet and was walking down the steps when she slipped and fell down the bottom step.  Her right knee bent backwards and her foot twisted outwards.  Pain is worse with any movement or palpation.  No on route provided temporary relief.  No previous history of surgeries or injuries to this joint.  She denies paresthesias or loss of sensation distally.  Last PO intake 1030am.   HPI  Past Medical History:  Diagnosis Date  . ALLERGIC RHINITIS 04/07/2007  . Arthritis   . ASTHMA 04/07/2007  . Cataracts, bilateral   . COLONIC POLYPS, HX OF 04/07/2007  . GERD 04/07/2007  . HYPERTENSION 04/07/2007  . Seasonal allergies   . UNSPECIFIED DISORDER TEETH&SUPPORTING STRUCTURES 09/09/2010    Patient Active Problem List   Diagnosis Date Noted  . Osteoarthritis 09/22/2012  . UNSPECIFIED DISORDER TEETH&SUPPORTING STRUCTURES 09/09/2010  . Essential hypertension 04/07/2007  . Allergic rhinitis 04/07/2007  . Asthma 04/07/2007  . GERD 04/07/2007  . COLONIC POLYPS, HX OF 04/07/2007    Past Surgical History:  Procedure Laterality Date  . ABDOMINAL HYSTERECTOMY    . CHOLECYSTECTOMY    . KNEE SURGERY Left 2014  . LAMINECTOMY       OB History   None      Home Medications    Prior to Admission medications   Medication Sig Start Date End Date Taking? Authorizing Provider  ADVAIR DISKUS 250-50 MCG/DOSE AEPB INHALE 1 PUFF INTO THE LUNGS EVERY 12 (TWELVE) HOURS FOR SHORTNESS OF BREATH AND WHEEZING Patient taking differently: Inhale 1 puff into the lungs every 12 (twelve)  hours.  02/22/17  Yes Gordy Savers, MD  albuterol (PROVENTIL HFA;VENTOLIN HFA) 108 (90 Base) MCG/ACT inhaler Inhale 2 puffs into the lungs every 6 (six) hours as needed for wheezing. 08/18/17  Yes Gordy Savers, MD  benazepril (LOTENSIN) 40 MG tablet TAKE 1 TABLET (40 MG TOTAL) BY MOUTH DAILY. 04/25/18  Yes Gordy Savers, MD  celecoxib (CELEBREX) 200 MG capsule TAKE 1 CAPSULE BY MOUTH EVERY DAY 04/12/18  Yes Gordy Savers, MD  COLLAGEN PO Take by mouth. Collagen powder-mix with coffee and drink daily   Yes [provider]  fluticasone (FLONASE) 50 MCG/ACT nasal spray SPRAY 2 SPRAYS INTO EACH NOSTRIL EVERY DAY Patient taking differently: Place 2 sprays into both nostrils daily.  02/16/18  Yes Gordy Savers, MD  hydrochlorothiazide (HYDRODIURIL) 25 MG tablet TAKE 1 TABLET BY MOUTH EVERY DAY 10/01/17  Yes Gordy Savers, MD  Lin[facility] PAIN RELIEF PATCH EX) Apply 1 patch topically daily as needed (pain).   Yes [provider]  Menthol, Topical Analgesic, (BLUE-EMU MAXIMUM STRENGTH EX) Apply 1 application topically daily.   Yes [provider]  traMADol (ULTRAM) 50 MG tablet TAKE 1 TABLET BY MOUTH EVERY 6 HOURS 06/01/18  Yes Gordy Savers, MD  oxycodone (OXY-IR) 5 MG capsule Take 1 capsule (5 mg total) by mouth every 4 (four) hours as needed for up to 2 days. 06/15/18 06/17/18  Liberty Handy, PA-C  Family History Family History  Problem Relation Age of Onset  . Colon cancer Neg Hx   . Esophageal cancer Neg Hx   . Rectal cancer Neg Hx   . Stomach cancer Neg Hx     Social History Social History   Tobacco Use  . Smoking status: Former Smoker    Last attempt to quit: 10/19/1988    Years since quitting: 29.6  . Smokeless tobacco: Never Used  Substance Use Topics  . Alcohol use: Yes    Alcohol/week: 7.0 - 14.0 standard drinks    Types: 7 - 14 Glasses of wine per week  . Drug use: Never     Allergies     Aspirin and Clonidine   Review of Systems Review of Systems  Musculoskeletal: Positive for arthralgias.  All other systems reviewed and are negative.    Physical Exam Updated Vital Signs BP (!) 155/80   Pulse 70   Temp 98.3 F (36.8 C) (Oral)   Resp (!) 27   SpO2 98%   Physical Exam  Constitutional: She is oriented to person, place, and time. She appears well-developed and well-nourished. No distress.  NAD.  HENT:  Head: Normocephalic and atraumatic.  Right Ear: External ear normal.  Left Ear: External ear normal.  Nose: Nose normal.  Eyes: Conjunctivae and EOM are normal. No scleral icterus.  Neck: Normal range of motion. Neck supple.  Cardiovascular: Normal rate, regular rhythm and normal heart sounds.  Toes are warm.  1+ DP pulses bilaterally.  Pulmonary/Chest: Effort normal and breath sounds normal.  Musculoskeletal: Normal range of motion. She exhibits tenderness and deformity.  Right ankle: Obvious deformity noted with medial malleolus protrusion medially.  Skin is intact.  Patient can wiggle toes.  Tenderness up to mid tib-fib.  No focal bony tenderness to right toes, right knee. Pelvis: No AP/lateral instability noted with compression.  Neurological: She is alert and oriented to person, place, and time.  Skin: Skin is warm and dry. Capillary refill takes less than 2 seconds.  Psychiatric: She has a normal mood and affect. Her behavior is normal. Judgment and thought content normal.  Nursing note and vitals reviewed.    ED Treatments / Results  Labs (all labs ordered are listed, but only abnormal results are displayed) Labs Reviewed - No data to display  EKG None  Radiology Dg Tibia/fibula Right  Result Date: 06/15/2018 CLINICAL DATA:  Right ankle pain after fall. EXAM: RIGHT TIBIA AND FIBULA - 2 VIEW COMPARISON:  None. FINDINGS: Acute, trimalleolar fracture-dislocation of the right ankle with anteriorly dislocated tibial plafond relative to the talar  dome. Slight lateral displacement is also seen of the foot relative to the tibial plafond. The knee joint is maintained. The remainder of the tibia and fibula appear intact. There is overlying soft tissue swelling. IMPRESSION: Acute trimalleolar fracture-dislocation of the right ankle with anteriorly dislocated tibial plafond relative to the talar dome and slight lateral displacement of foot also present. Electronically Signed   By: Tollie Ethavid  Kwon M.D.   On: 06/15/2018 15:12   Dg Ankle Complete Right  Result Date: 06/15/2018 CLINICAL DATA:  Follow-up examination status post splinting and reduction. EXAM: RIGHT ANKLE - COMPLETE 3+ VIEW COMPARISON:  Prior radiograph from earlier same day. FINDINGS: Splinting material overlies right ankle, somewhat limiting evaluation for fine osseous detail. Previously identified tri malleolar fracture of the right ankle again seen. Tibiotalar joint now in anatomic alignment. Persistent 3 mm lateral displacement at the distal fibula. Mild persistent displacement/distraction at  the posterior and medial malleolar fractures. Diffuse soft tissue swelling about the ankle. IMPRESSION: Interval splinting and reduction about the acute right ankle trimalleolar fracture as above. Tibial talar joint in normal anatomic alignment. Electronically Signed   By: Rise Mu M.D.   On: 06/15/2018 17:24   Dg Ankle Complete Right  Result Date: 06/15/2018 CLINICAL DATA:  Diffuse right ankle pain after fall. EXAM: RIGHT ANKLE - COMPLETE 3+ VIEW COMPARISON:  None. FINDINGS: Acute, trimalleolar fracture-dislocation of the right ankle is noted with anteriorly dislocation of the tibial plafond relative to the talar dome. An oblique fracture of the distal fibular metadiaphysis and transverse fracture of the medial malleolus is noted. A coronal fracture of the posterior malleolus is identified. The midfoot and subtalar joints are intact. No fracture of the more proximal tibia nor fibula. Overlying  soft tissue swelling is seen about the ankle. IMPRESSION: Acute, trimalleolar fracture-dislocation of the right ankle with anterior displaced tibial plafond relative to the talar dome. Electronically Signed   By: Tollie Eth M.D.   On: 06/15/2018 15:09    Procedures .Ortho Injury Treatment Date/Time: 06/15/2018 5:40 PM Performed by: Liberty Handy, PA-C Authorized by: Liberty Handy, PA-C   Consent:    Consent obtained:  Written   Consent given by:  Patient   Risks discussed:  Nerve damage, restricted joint movement, vascular damage, recurrent dislocation and irreducible dislocation   Alternatives discussed:  Referral and delayed treatmentInjury location: ankle Location details: right ankle Injury type: fracture Fracture type: trimalleolar Pre-procedure neurovascular assessment: neurovascularly intact Pre-procedure distal perfusion: normal Pre-procedure neurological function: normal Pre-procedure range of motion: reduced  Anesthesia: Local anesthesia used: no  Patient sedated: Yes. Refer to sedation procedure documentation for details of sedation. Manipulation performed: yes Skin traction used: yes Skeletal traction used: yes Reduction successful: yes X-ray confirmed reduction: yes Immobilization: splint Splint type: short leg Supplies used: cotton padding,  elastic bandage and Ortho-Glass Post-procedure neurovascular assessment: post-procedure neurovascularly intact Post-procedure distal perfusion: normal Post-procedure neurological function: normal Post-procedure range of motion: unchanged Patient tolerance: Patient tolerated the procedure well with no immediate complications  .Sedation Date/Time: 06/15/2018 5:41 PM Performed by: Liberty Handy, PA-C Authorized by: Liberty Handy, PA-C   Consent:    Consent obtained:  Written   Consent given by:  Patient   Risks discussed:  Inadequate sedation, dysrhythmia, prolonged sedation necessitating reversal,  prolonged hypoxia resulting in organ damage, respiratory compromise necessitating ventilatory assistance and intubation, vomiting and nausea   Alternatives discussed:  Analgesia without sedation Universal protocol:    Procedure explained and questions answered to patient or proxy's satisfaction: yes     Relevant documents present and verified: yes     Immediately prior to procedure a time out was called: yes     Patient identity confirmation method:  Arm band Indications:    Procedure performed:  Fracture reduction   Procedure necessitating sedation performed by:  Physician performing sedation   Intended level of sedation:  Moderate (conscious sedation) Pre-sedation assessment:    Time since last food or drink:  1030 am   ASA classification: class 2 - patient with mild systemic disease     Neck mobility: normal     Mouth opening:  2 finger widths   Thyromental distance:  3 finger widths   Mallampati score:  III - soft palate, base of uvula visible   Pre-sedation assessments completed and reviewed: airway patency, cardiovascular function, mental status, pain level and temperature   Immediate pre-procedure details:  Reassessment: Patient reassessed immediately prior to procedure     Reviewed: vital signs     Verified: bag valve mask available, emergency equipment available, intubation equipment available, IV patency confirmed, oxygen available and suction available   Procedure details (see MAR for exact dosages):    Sedation:  Etomidate   Analgesia:  Fentanyl   Intra-procedure monitoring:  Blood pressure monitoring, cardiac monitor, continuous capnometry, continuous pulse oximetry and frequent vital sign checks   Intra-procedure events: none     Total Provider sedation time (minutes):  10 Post-procedure details:    Attendance: Constant attendance by certified staff until patient recovered     Recovery: Patient returned to pre-procedure baseline     Post-sedation assessments completed  and reviewed: airway patency, cardiovascular function, mental status and pain level     Patient is stable for discharge or admission: yes     Patient tolerance:  Tolerated well, no immediate complications   (including critical care time)  Medications Ordered in ED Medications  etomidate (AMIDATE) injection 10 mg (10 mg Intravenous Not Given 06/15/18 1615)  fentaNYL (SUBLIMAZE) injection 50 mcg (50 mcg Intravenous Given 06/15/18 1502)  ondansetron (ZOFRAN) injection 4 mg (4 mg Intravenous Given 06/15/18 1502)  etomidate (AMIDATE) injection (5 mg Intravenous Given 06/15/18 1551)  oxyCODONE-acetaminophen (PERCOCET/ROXICET) 5-325 MG per tablet 1 tablet (1 tablet Oral Given 06/15/18 1702)  acetaminophen (TYLENOL) tablet 650 mg (650 mg Oral Given 06/15/18 1702)     Initial Impression / Assessment and Plan / ED Course  I have reviewed the triage vital signs and the nursing notes.  Pertinent labs & imaging results that were available during my care of the patient were reviewed by me and considered in my medical decision making (see chart for details).  Clinical Course as of Jun 15 1742  Wed Jun 15, 2018  1539 IMPRESSION: Acute, trimalleolar fracture-dislocation of the right ankle with anterior displaced tibial plafond relative to the talar dome.    DG Ankle Complete Right [CG]    Clinical Course User Index [CG] Liberty Handy, PA-C    77 year old with trimalleolar fracture with dislocation of the right ankle.  Extremity is neurovascularly intact pre-and post reduction.  Reduction done in the ER with Dr. Rubin Payor.  She was placed in a splint.  She was observed after procedure and she had no acute complications.  Consulted orthopedic surgery (Dr. Magnus Ivan) who recommends splint, nonweightbearing and phone call in the morning to schedule urgent follow-up in the office.  Discussed this plan with patient and family at bedside.  No indication for further emergent lab work or imaging or admission  at this time.  Discussed return precautions.  Final Clinical Impressions(s) / ED Diagnoses   Final diagnoses:  Closed trimalleolar fracture of right ankle, initial encounter    ED Discharge Orders         Ordered    oxycodone (OXY-IR) 5 MG capsule  Every 4 hours PRN     06/15/18 1734           Liberty Handy, PA-C 06/15/18 1743    Benjiman Core, MD 06/16/18 0700

## 2018-06-15 NOTE — ED Notes (Signed)
Bed: [facility] Expected date:  Expected time:  Means of arrival:  Comments: No bed  

## 2018-06-15 NOTE — ED Triage Notes (Signed)
She states she tripped and fell at the bottom of her stairs, thereby twisting her right ankle. She arrives with foam velcro splint. She had rec'd. 50 mcg of Fentanyl intranasal and 100 mcg of Fentanyl I.M. En route to hospital.

## 2018-06-21 ENCOUNTER — Ambulatory Visit (INDEPENDENT_AMBULATORY_CARE_PROVIDER_SITE_OTHER): Payer: Medicare Other | Admitting: Orthopaedic Surgery

## 2018-06-21 ENCOUNTER — Other Ambulatory Visit (INDEPENDENT_AMBULATORY_CARE_PROVIDER_SITE_OTHER): Payer: Self-pay | Admitting: Physician Assistant

## 2018-06-21 ENCOUNTER — Encounter (INDEPENDENT_AMBULATORY_CARE_PROVIDER_SITE_OTHER): Payer: Self-pay | Admitting: Orthopaedic Surgery

## 2018-06-21 DIAGNOSIS — S82851A Displaced trimalleolar fracture of right lower leg, initial encounter for closed fracture: Secondary | ICD-10-CM | POA: Diagnosis not present

## 2018-06-21 NOTE — Progress Notes (Signed)
Need orders in epic asap for 06-24-18 surgery.

## 2018-06-21 NOTE — Patient Instructions (Addendum)
Kristy [name]               DOB  1941-04-03                Your procedure is scheduled on:  06-24-2018  Report to [facility] Main  Entrance to Admitting at 12:15 PM     Call this number if you have problems the morning of surgery [phone]               Remember: Do not eat food After Midnight.  May have clear liquid diet from midnight until 8:45 AM day of surgery. After 8:45 AM nothing by mouth including water, candy,                       gum,mints    CLEAR LIQUID DIET   Foods Allowed                                                                     Foods Excluded  Coffee and tea, regular and decaf                             liquids that you cannot  Plain Jell-O in any flavor                                             see through such as: Fruit ices (not with fruit pulp)                                     milk, soups, orange juice  Iced Popsicles                                    All solid food Carbonated beverages, regular and diet                                    Cranberry, grape and apple juices Sports drinks like Gatorade Lightly seasoned clear broth or consume(fat free) Sugar, honey syrup  Sample Menu Breakfast                                Lunch                                     Supper Cranberry juice                    Beef broth                            Chicken broth Jell-O  Grape juice                           Apple juice Coffee or tea                        Jell-O                                      Popsicle                                                Coffee or tea                        Coffee or tea  _____________________________________________________________________     Take these medicines the morning of surgery with A SIP OF WATER:   Flonase nasal spray if needed , Advair inhaler, Tramadol if needed, Bring rescue with you day of surgery.                                You may  not have any metal on your body including hair pins and              piercings               Do not wear jewelry, make-up, lotions, powders or perfumes, deodorant             Do not wear nail polish.  Do not shave  48 hours prior to surgery.     Do not bring valuables to the hospital. [facility] IS NOT             RESPONSIBLE   FOR VALUABLES.  Contacts, dentures or bridgework may not be worn into surgery.  Leave suitcase in the car. After surgery it may be brought to your room.     Patients discharged the day of surgery will not be allowed to drive home.  Name and phone number of your driver:  Special Instructions: N/A              Please read over the following fact sheets you were given: _____________________________________________________________________            Incentive Spirometer  An incentive spirometer is a tool that can help keep your lungs clear and active. This tool measures how well you are filling your lungs with each breath. Taking long deep breaths may help reverse or decrease the chance of developing breathing (pulmonary) problems (especially infection) following:  A long period of time when you are unable to move or be active. BEFORE THE PROCEDURE   If the spirometer includes an indicator to show your best effort, your nurse or respiratory therapist will set it to a desired goal.  If possible, sit up straight or lean slightly forward. Try not to slouch.  Hold the incentive spirometer in an upright position. INSTRUCTIONS FOR USE  1. Sit on the edge of your bed if possible, or sit up as far as you can in bed or on a chair. 2. Hold the incentive spirometer in an upright position. 3. Breathe out normally. 4. Place  the mouthpiece in your mouth and seal your lips tightly around it. 5. Breathe in slowly and as deeply as possible, raising the piston or the ball toward the top of the column. 6. Hold your breath for 3-5 seconds or for as long as possible. Allow the  piston or ball to fall to the bottom of the column. 7. Remove the mouthpiece from your mouth and breathe out normally. 8. Rest for a few seconds and repeat Steps 1 through 7 at least 10 times every 1-2 hours when you are awake. Take your time and take a few normal breaths between deep breaths. 9. The spirometer may include an indicator to show your best effort. Use the indicator as a goal to work toward during each repetition. 10. After each set of 10 deep breaths, practice coughing to be sure your lungs are clear. If you have an incision (the cut made at the time of surgery), support your incision when coughing by placing a pillow or rolled up towels firmly against it. Once you are able to get out of bed, walk around indoors and cough well. You may stop using the incentive spirometer when instructed by your caregiver.  RISKS AND COMPLICATIONS  Take your time so you do not get dizzy or light-headed.  If you are in pain, you may need to take or ask for pain medication before doing incentive spirometry. It is harder to take a deep breath if you are having pain. AFTER USE  Rest and breathe slowly and easily.  It can be helpful to keep track of a log of your progress. Your caregiver can provide you with a simple table to help with this. If you are using the spirometer at home, follow these instructions: SEEK MEDICAL CARE IF:   You are having difficultly using the spirometer.  You have trouble using the spirometer as often as instructed.  Your pain medication is not giving enough relief while using the spirometer.  You develop fever of 100.5 F (38.1 C) or higher. SEEK IMMEDIATE MEDICAL CARE IF:   You cough up bloody sputum that had not been present before.  You develop fever of 102 F (38.9 C) or greater.  You develop worsening pain at or near the incision site. MAKE SURE YOU:   Understand these instructions.  Will watch your condition.  Will get help right away if you are not  doing well or get worse. Document Released: 02/15/2007 Document Revised: 12/28/2011 Document Reviewed: 04/18/2007 Exit[employer] Patient Information 2014 [employer].   ________________________________________________________________________  [facility] - Preparing for Surgery Before surgery, you can play an important role.  Because skin is not sterile, your skin needs to be as free of germs as possible.  You can reduce the number of germs on your skin by washing with CHG (chlorahexidine gluconate) soap before surgery.  CHG is an antiseptic cleaner which kills germs and bonds with the skin to continue killing germs even after washing. Please DO NOT use if you have an allergy to CHG or antibacterial soaps.  If your skin becomes reddened/irritated stop using the CHG and inform your nurse when you arrive at Short Stay. Do not shave (including legs and underarms) for at least 48 hours prior to the first CHG shower.  You may shave your face/neck. Please follow these instructions carefully:  1.  Shower with CHG Soap the night before surgery and the  morning of Surgery.  2.  If you choose to wash your hair, wash your hair  first as usual with your  normal  shampoo.  3.  After you shampoo, rinse your hair and body thoroughly to remove the  shampoo.                                        4.  Use CHG as you would any other liquid soap.  You can apply chg directly  to the skin and wash                       Gently with a scrungie or clean washcloth.  5.  Apply the CHG Soap to your body ONLY FROM THE NECK DOWN.   Do not use on face/ open                           Wound or open sores. Avoid contact with eyes, ears mouth and genitals (private parts).                       Wash face,  Genitals (private parts) with your normal soap.             6.  Wash thoroughly, paying special attention to the area where your surgery  will be performed.  7.  Thoroughly rinse your body with warm water from the neck down.  8.   DO NOT shower/wash with your normal soap after using and rinsing off  the CHG Soap.             9.  Pat yourself dry with a clean towel.            10.  Wear clean pajamas.            11.  Place clean sheets on your bed the night of your first shower and do not  sleep with pets. Day of Surgery : Do not apply any lotions/deodorants the morning of surgery.  Please wear clean clothes to the hospital/surgery center.  FAILURE TO FOLLOW THESE INSTRUCTIONS MAY RESULT IN THE CANCELLATION OF YOUR SURGERY PATIENT SIGNATURE_________________________________  NURSE SIGNATURE__________________________________  ________________________________________________________________________

## 2018-06-21 NOTE — Progress Notes (Signed)
Office Visit Note   Patient: Kristy [name]           Date of Birth: May 03, 1941           MRN: [id number] Visit Date: 06/21/2018              Requested by: Kristy Savers, MD [address] [place], [place] [address] PCP: Kristy Savers, MD   Assessment & Plan: Visit Diagnoses:  1. Closed trimalleolar fracture of right ankle, initial encounter     Plan: Recommend open reduction internal fixation of the right ankle trimalleolar fracture.  Patient wishes to proceed with surgery.  Questions encouraged and answered at length by Dr. Magnus [name] myself.  Risk benefits reviewed with patient.  Postoperative protocol reviewed with patient.  We will see her back 2 weeks postop.  She will remain touchdown weightbearing for balance until the time of surgery.  Continue to work on elevation wiggling toes.  Follow-Up Instructions: Return for 2 weeks post-op.   Orders:  No orders of the defined types were placed in this encounter.  No orders of the defined types were placed in this encounter.     Procedures: No procedures performed   Clinical Data: No additional findings.   Subjective: Chief Complaint  Patient presents with  . Right Ankle - Fracture    HPI Kristy [name] is a 77 year old female who unfortunately missed last step coming down some steps at her home on 06/15/2018.  She did not lose consciousness had no dizziness no chest pain no shortness of breath upon the injury.  She states that she came down on her right leg initially thought she injured her knee but looked down and saw gross deformity of the ankle.  EMS was called and they transported to the hospital where she was found to have a right ankle trimalleolar fracture with anterior displacement of the tibial plafond.  This was reduced and she was placed in a posterior splint with stirrup.  Films were reviewed by Dr. Magnus [name] and myself and showed good reduction of the trimalleolar ankle fracture.  Talus is well  located within the ankle mortise on the post reduction films.  She has been elevating mostly getting around with a wheelchair she is able to use a walker for short period time remaining nonweightbearing.  She been taking Tylenol for pain.  Review of Systems Please see HPI otherwise negative  Objective: Vital Signs: There were no vitals taken for this visit.  Physical Exam  Constitutional: She is oriented to person, place, and time. She appears well-developed and well-nourished. No distress.  Pulmonary/Chest: Effort normal.  Neurological: She is alert and oriented to person, place, and time.  Skin: Skin is warm and dry. She is not diaphoretic.  Psychiatric: She has a normal mood and affect.    Ortho Exam Right Proximal tib-fib nontender.  Calf supple.  Sensation grossly intact toes.  No significant swelling toes.  Foot is warm and dry.  Splint is clean dry and intact. Specialty Comments:  No specialty comments available.  Imaging: No results found.   PMFS History: Patient Active Problem List   Diagnosis Date Noted  . Closed trimalleolar fracture of right ankle 06/21/2018  . Osteoarthritis 09/22/2012  . UNSPECIFIED DISORDER TEETH&SUPPORTING STRUCTURES 09/09/2010  . Essential hypertension 04/07/2007  . Allergic rhinitis 04/07/2007  . Asthma 04/07/2007  . GERD 04/07/2007  . COLONIC POLYPS, HX OF 04/07/2007   Past Medical History:  Diagnosis Date  . ALLERGIC RHINITIS 04/07/2007  .  Arthritis   . ASTHMA 04/07/2007  . Cataracts, bilateral   . COLONIC POLYPS, HX OF 04/07/2007  . GERD 04/07/2007  . HYPERTENSION 04/07/2007  . Seasonal allergies   . UNSPECIFIED DISORDER TEETH&SUPPORTING STRUCTURES 09/09/2010    Family History  Problem Relation Age of Onset  . Colon cancer Neg Hx   . Esophageal cancer Neg Hx   . Rectal cancer Neg Hx   . Stomach cancer Neg Hx     Past Surgical History:  Procedure Laterality Date  . ABDOMINAL HYSTERECTOMY    . CHOLECYSTECTOMY    . KNEE SURGERY  Left 2014  . LAMINECTOMY     Social History   Occupational History  . Not on file  Tobacco Use  . Smoking status: Former Smoker    Last attempt to quit: 10/19/1988    Years since quitting: 29.6  . Smokeless tobacco: Never Used  Substance and Sexual Activity  . Alcohol use: Yes    Alcohol/week: 7.0 - 14.0 standard drinks    Types: 7 - 14 Glasses of wine per week  . Drug use: Never  . Sexual activity: Not on file

## 2018-06-22 ENCOUNTER — Encounter (HOSPITAL_COMMUNITY): Payer: Self-pay

## 2018-06-22 ENCOUNTER — Encounter (HOSPITAL_COMMUNITY)
Admission: RE | Admit: 2018-06-22 | Discharge: 2018-06-22 | Disposition: A | Discharge status: Home / Self Care | Payer: Medicare Other | Source: Ambulatory Visit | Attending: Orthopaedic Surgery | Admitting: Orthopaedic Surgery

## 2018-06-22 ENCOUNTER — Other Ambulatory Visit: Payer: Self-pay

## 2018-06-22 DIAGNOSIS — J45909 Unspecified asthma, uncomplicated: Secondary | ICD-10-CM | POA: Diagnosis not present

## 2018-06-22 DIAGNOSIS — M1990 Unspecified osteoarthritis, unspecified site: Secondary | ICD-10-CM | POA: Diagnosis not present

## 2018-06-22 DIAGNOSIS — I491 Atrial premature depolarization: Secondary | ICD-10-CM | POA: Insufficient documentation

## 2018-06-22 DIAGNOSIS — Z01810 Encounter for preprocedural cardiovascular examination: Secondary | ICD-10-CM | POA: Insufficient documentation

## 2018-06-22 DIAGNOSIS — W010XXA Fall on same level from slipping, tripping and stumbling without subsequent striking against object, initial encounter: Secondary | ICD-10-CM | POA: Diagnosis not present

## 2018-06-22 DIAGNOSIS — Z01818 Encounter for other preprocedural examination: Secondary | ICD-10-CM | POA: Insufficient documentation

## 2018-06-22 DIAGNOSIS — Y92008 Other place in unspecified non-institutional (private) residence as the place of occurrence of the external cause: Secondary | ICD-10-CM | POA: Diagnosis not present

## 2018-06-22 DIAGNOSIS — Z888 Allergy status to other drugs, medicaments and biological substances status: Secondary | ICD-10-CM | POA: Diagnosis not present

## 2018-06-22 DIAGNOSIS — S82851A Displaced trimalleolar fracture of right lower leg, initial encounter for closed fracture: Secondary | ICD-10-CM

## 2018-06-22 DIAGNOSIS — I10 Essential (primary) hypertension: Secondary | ICD-10-CM | POA: Diagnosis not present

## 2018-06-22 DIAGNOSIS — X58XXXA Exposure to other specified factors, initial encounter: Secondary | ICD-10-CM | POA: Insufficient documentation

## 2018-06-22 DIAGNOSIS — Z87891 Personal history of nicotine dependence: Secondary | ICD-10-CM | POA: Diagnosis not present

## 2018-06-22 DIAGNOSIS — Z886 Allergy status to analgesic agent status: Secondary | ICD-10-CM | POA: Diagnosis not present

## 2018-06-22 DIAGNOSIS — K219 Gastro-esophageal reflux disease without esophagitis: Secondary | ICD-10-CM | POA: Diagnosis not present

## 2018-06-22 DIAGNOSIS — Z79899 Other long term (current) drug therapy: Secondary | ICD-10-CM | POA: Diagnosis not present

## 2018-06-22 HISTORY — DX: Personal history of colonic polyps: Z86.010

## 2018-06-22 HISTORY — DX: Other seasonal allergic rhinitis: J30.2

## 2018-06-22 HISTORY — DX: Unspecified asthma, uncomplicated: J45.909

## 2018-06-22 HISTORY — DX: Essential (primary) hypertension: I10

## 2018-06-22 HISTORY — DX: Gastro-esophageal reflux disease without esophagitis: K21.9

## 2018-06-22 HISTORY — DX: Personal history of colon polyps, unspecified: Z86.0100

## 2018-06-22 HISTORY — DX: Unspecified rotator cuff tear or rupture of right shoulder, not specified as traumatic: M75.101

## 2018-06-22 LAB — CBC
HEMATOCRIT: 40 % (ref 36.0–46.0)
HEMOGLOBIN: 13.2 g/dL (ref 12.0–15.0)
MCH: 30.3 pg (ref 26.0–34.0)
MCHC: 33 g/dL (ref 30.0–36.0)
MCV: 92 fL (ref 78.0–100.0)
Platelets: 363 10*3/uL (ref 150–400)
RBC: 4.35 MIL/uL (ref 3.87–5.11)
RDW: 13 % (ref 11.5–15.5)
WBC: 7 10*3/uL (ref 4.0–10.5)

## 2018-06-22 LAB — BASIC METABOLIC PANEL
ANION GAP: 11 (ref 5–15)
BUN: 20 mg/dL (ref 8–23)
CALCIUM: 9.3 mg/dL (ref 8.9–10.3)
CHLORIDE: 103 mmol/L (ref 98–111)
CO2: 28 mmol/L (ref 22–32)
CREATININE: 0.66 mg/dL (ref 0.44–1.00)
GFR calc Af Amer: >60 mL/min (ref >60)
GFR calc non Af Amer: >60 mL/min (ref >60)
GLUCOSE: 100 mg/dL — AB (ref 70–99)
POTASSIUM: 3.6 mmol/L (ref 3.5–5.1)
Sodium: 142 mmol/L (ref 135–145)

## 2018-06-22 NOTE — Progress Notes (Signed)
FINAL EKG DATED 06-22-2018 IN Epic.

## 2018-06-24 ENCOUNTER — Observation Stay (HOSPITAL_COMMUNITY): Payer: Medicare Other

## 2018-06-24 ENCOUNTER — Other Ambulatory Visit: Payer: Self-pay

## 2018-06-24 ENCOUNTER — Ambulatory Visit (HOSPITAL_COMMUNITY): Payer: Medicare Other | Admitting: Certified Registered"

## 2018-06-24 ENCOUNTER — Encounter (HOSPITAL_COMMUNITY)
Admission: RE | Disposition: A | Discharge status: Home / Self Care | Payer: Self-pay | Source: Ambulatory Visit | Attending: Orthopaedic Surgery

## 2018-06-24 ENCOUNTER — Encounter (HOSPITAL_COMMUNITY): Payer: Self-pay

## 2018-06-24 ENCOUNTER — Observation Stay (HOSPITAL_COMMUNITY)
Admission: RE | Admit: 2018-06-24 | Discharge: 2018-06-26 | Disposition: A | Discharge status: Home / Self Care | Payer: Medicare Other | Source: Ambulatory Visit | Attending: Orthopaedic Surgery | Admitting: Orthopaedic Surgery

## 2018-06-24 DIAGNOSIS — Z79899 Other long term (current) drug therapy: Secondary | ICD-10-CM | POA: Diagnosis not present

## 2018-06-24 DIAGNOSIS — S82851A Displaced trimalleolar fracture of right lower leg, initial encounter for closed fracture: Principal | ICD-10-CM | POA: Insufficient documentation

## 2018-06-24 DIAGNOSIS — Y92008 Other place in unspecified non-institutional (private) residence as the place of occurrence of the external cause: Secondary | ICD-10-CM | POA: Insufficient documentation

## 2018-06-24 DIAGNOSIS — M1990 Unspecified osteoarthritis, unspecified site: Secondary | ICD-10-CM | POA: Insufficient documentation

## 2018-06-24 DIAGNOSIS — I10 Essential (primary) hypertension: Secondary | ICD-10-CM | POA: Diagnosis not present

## 2018-06-24 DIAGNOSIS — Z886 Allergy status to analgesic agent status: Secondary | ICD-10-CM | POA: Insufficient documentation

## 2018-06-24 DIAGNOSIS — Z87891 Personal history of nicotine dependence: Secondary | ICD-10-CM | POA: Insufficient documentation

## 2018-06-24 DIAGNOSIS — G8918 Other acute postprocedural pain: Secondary | ICD-10-CM | POA: Diagnosis not present

## 2018-06-24 DIAGNOSIS — K219 Gastro-esophageal reflux disease without esophagitis: Secondary | ICD-10-CM | POA: Insufficient documentation

## 2018-06-24 DIAGNOSIS — J45909 Unspecified asthma, uncomplicated: Secondary | ICD-10-CM | POA: Diagnosis not present

## 2018-06-24 DIAGNOSIS — Z888 Allergy status to other drugs, medicaments and biological substances status: Secondary | ICD-10-CM | POA: Insufficient documentation

## 2018-06-24 DIAGNOSIS — W010XXA Fall on same level from slipping, tripping and stumbling without subsequent striking against object, initial encounter: Secondary | ICD-10-CM | POA: Insufficient documentation

## 2018-06-24 HISTORY — PX: ORIF ANKLE FRACTURE: SHX5408

## 2018-06-24 SURGERY — OPEN REDUCTION INTERNAL FIXATION (ORIF) ANKLE FRACTURE
Anesthesia: General | Site: Ankle | Laterality: Right

## 2018-06-24 MED ORDER — GLYCOPYRROLATE 0.2 MG/ML IJ SOLN
INTRAMUSCULAR | Status: DC | PRN
  Administered 2018-06-24: 0.1 mg via INTRAVENOUS

## 2018-06-24 MED ORDER — MIDAZOLAM HCL 2 MG/2ML IJ SOLN
INTRAMUSCULAR | Status: AC
  Filled 2018-06-24: qty 2

## 2018-06-24 MED ORDER — ONDANSETRON HCL 4 MG/2ML IJ SOLN: 4.0000 mg | Freq: Four times a day (QID) | INTRAMUSCULAR | Status: DC | PRN

## 2018-06-24 MED ORDER — METOCLOPRAMIDE HCL 5 MG/ML IJ SOLN: 5.0000 mg | Freq: Three times a day (TID) | INTRAMUSCULAR | Status: DC | PRN

## 2018-06-24 MED ORDER — METHOCARBAMOL 500 MG PO TABS: 500.0000 mg | ORAL_TABLET | Freq: Four times a day (QID) | ORAL | Status: DC | PRN

## 2018-06-24 MED ORDER — SODIUM CHLORIDE 0.9 % IV SOLN
INTRAVENOUS | Status: DC
  Administered 2018-06-24: 19:00:00 via INTRAVENOUS

## 2018-06-24 MED ORDER — FLUTICASONE PROPIONATE 50 MCG/ACT NA SUSP
2.0000 | Freq: Every day | NASAL | Status: DC
  Administered 2018-06-25 – 2018-06-26 (×2): 2 via NASAL
  Filled 2018-06-24: qty 16

## 2018-06-24 MED ORDER — CEFAZOLIN SODIUM-DEXTROSE 1-4 GM/50ML-% IV SOLN
1.0000 g | Freq: Four times a day (QID) | INTRAVENOUS | Status: AC
  Administered 2018-06-24 – 2018-06-25 (×3): 1 g via INTRAVENOUS
  Filled 2018-06-24 (×3): qty 50

## 2018-06-24 MED ORDER — DEXAMETHASONE SODIUM PHOSPHATE 10 MG/ML IJ SOLN
INTRAMUSCULAR | Status: AC
  Filled 2018-06-24: qty 1

## 2018-06-24 MED ORDER — OXYCODONE HCL 5 MG/5ML PO SOLN
5.0000 mg | Freq: Once | ORAL | Status: DC | PRN
  Filled 2018-06-24: qty 5

## 2018-06-24 MED ORDER — HYDROMORPHONE HCL 1 MG/ML IJ SOLN
INTRAMUSCULAR | Status: DC | PRN
  Administered 2018-06-24 (×2): 0.5 mg via INTRAVENOUS

## 2018-06-24 MED ORDER — METOCLOPRAMIDE HCL 5 MG PO TABS: 5.0000 mg | ORAL_TABLET | Freq: Three times a day (TID) | ORAL | Status: DC | PRN

## 2018-06-24 MED ORDER — FENTANYL CITRATE (PF) 100 MCG/2ML IJ SOLN
INTRAMUSCULAR | Status: AC
  Filled 2018-06-24: qty 4

## 2018-06-24 MED ORDER — EPHEDRINE 5 MG/ML INJ
INTRAVENOUS | Status: AC
  Filled 2018-06-24: qty 10

## 2018-06-24 MED ORDER — LIDOCAINE 2% (20 MG/ML) 5 ML SYRINGE
INTRAMUSCULAR | Status: DC | PRN
  Administered 2018-06-24: 60 mg via INTRAVENOUS

## 2018-06-24 MED ORDER — HYDROCODONE-ACETAMINOPHEN 7.5-325 MG PO TABS
1.0000 | ORAL_TABLET | ORAL | Status: DC | PRN
  Administered 2018-06-25: 1 via ORAL
  Filled 2018-06-24: qty 1

## 2018-06-24 MED ORDER — HYDROMORPHONE HCL 1 MG/ML IJ SOLN
INTRAMUSCULAR | Status: AC
  Filled 2018-06-24: qty 1

## 2018-06-24 MED ORDER — CHLORHEXIDINE GLUCONATE 4 % EX LIQD: 60.0000 mL | Freq: Once | CUTANEOUS | Status: DC

## 2018-06-24 MED ORDER — HYDROMORPHONE HCL 1 MG/ML IJ SOLN
0.2500 mg | INTRAMUSCULAR | Status: DC | PRN
  Administered 2018-06-24 (×2): 0.5 mg via INTRAVENOUS

## 2018-06-24 MED ORDER — PROPOFOL 10 MG/ML IV BOLUS
INTRAVENOUS | Status: DC | PRN
  Administered 2018-06-24: 170 mg via INTRAVENOUS

## 2018-06-24 MED ORDER — LIDOCAINE 2% (20 MG/ML) 5 ML SYRINGE
INTRAMUSCULAR | Status: AC
  Filled 2018-06-24: qty 5

## 2018-06-24 MED ORDER — FENTANYL CITRATE (PF) 100 MCG/2ML IJ SOLN
INTRAMUSCULAR | Status: AC
  Filled 2018-06-24: qty 2

## 2018-06-24 MED ORDER — ALBUTEROL SULFATE (2.5 MG/3ML) 0.083% IN NEBU: 3.0000 mL | INHALATION_SOLUTION | Freq: Four times a day (QID) | RESPIRATORY_TRACT | Status: DC | PRN

## 2018-06-24 MED ORDER — BUPIVACAINE-EPINEPHRINE (PF) 0.5% -1:200000 IJ SOLN
INTRAMUSCULAR | Status: DC | PRN
  Administered 2018-06-24: 30 mL via PERINEURAL

## 2018-06-24 MED ORDER — SODIUM CHLORIDE 0.9 % IV SOLN
INTRAVENOUS | Status: DC | PRN
  Administered 2018-06-24: 20 ug/min via INTRAVENOUS

## 2018-06-24 MED ORDER — DEXAMETHASONE SODIUM PHOSPHATE 10 MG/ML IJ SOLN
INTRAMUSCULAR | Status: DC | PRN
  Administered 2018-06-24: 10 mg via INTRAVENOUS

## 2018-06-24 MED ORDER — POLYETHYLENE GLYCOL 3350 17 G PO PACK: 17.0000 g | PACK | Freq: Every day | ORAL | Status: DC | PRN

## 2018-06-24 MED ORDER — EPHEDRINE SULFATE-NACL 50-0.9 MG/10ML-% IV SOSY
PREFILLED_SYRINGE | INTRAVENOUS | Status: DC | PRN
  Administered 2018-06-24 (×2): 5 mg via INTRAVENOUS

## 2018-06-24 MED ORDER — ONDANSETRON HCL 4 MG PO TABS: 4.0000 mg | ORAL_TABLET | Freq: Four times a day (QID) | ORAL | Status: DC | PRN

## 2018-06-24 MED ORDER — METHOCARBAMOL 500 MG IVPB - SIMPLE MED
500.0000 mg | Freq: Four times a day (QID) | INTRAVENOUS | Status: DC | PRN
  Administered 2018-06-24: 500 mg via INTRAVENOUS
  Filled 2018-06-24: qty 50

## 2018-06-24 MED ORDER — PHENYLEPHRINE 40 MCG/ML (10ML) SYRINGE FOR IV PUSH (FOR BLOOD PRESSURE SUPPORT)
PREFILLED_SYRINGE | INTRAVENOUS | Status: DC | PRN
  Administered 2018-06-24 (×2): 40 ug via INTRAVENOUS
  Administered 2018-06-24: 80 ug via INTRAVENOUS
  Administered 2018-06-24 (×2): 40 ug via INTRAVENOUS
  Administered 2018-06-24: 80 ug via INTRAVENOUS

## 2018-06-24 MED ORDER — METHOCARBAMOL 500 MG IVPB - SIMPLE MED
INTRAVENOUS | Status: AC
  Filled 2018-06-24: qty 50

## 2018-06-24 MED ORDER — GLYCOPYRROLATE PF 0.2 MG/ML IJ SOSY
PREFILLED_SYRINGE | INTRAMUSCULAR | Status: AC
  Filled 2018-06-24: qty 1

## 2018-06-24 MED ORDER — FENTANYL CITRATE (PF) 100 MCG/2ML IJ SOLN
INTRAMUSCULAR | Status: DC | PRN
  Administered 2018-06-24 (×2): 50 ug via INTRAVENOUS

## 2018-06-24 MED ORDER — MOMETASONE FURO-FORMOTEROL FUM 200-5 MCG/ACT IN AERO
2.0000 | INHALATION_SPRAY | Freq: Two times a day (BID) | RESPIRATORY_TRACT | Status: DC
  Administered 2018-06-24 – 2018-06-26 (×4): 2 via RESPIRATORY_TRACT
  Filled 2018-06-24: qty 8.8

## 2018-06-24 MED ORDER — MIDAZOLAM HCL 5 MG/5ML IJ SOLN
INTRAMUSCULAR | Status: DC | PRN
  Administered 2018-06-24 (×2): 1 mg via INTRAVENOUS

## 2018-06-24 MED ORDER — ONDANSETRON HCL 4 MG/2ML IJ SOLN
INTRAMUSCULAR | Status: AC
  Filled 2018-06-24: qty 2

## 2018-06-24 MED ORDER — CELECOXIB 200 MG PO CAPS
200.0000 mg | ORAL_CAPSULE | Freq: Every day | ORAL | Status: DC
  Administered 2018-06-25 – 2018-06-26 (×2): 200 mg via ORAL
  Filled 2018-06-24 (×2): qty 1

## 2018-06-24 MED ORDER — 0.9 % SODIUM CHLORIDE (POUR BTL) OPTIME
TOPICAL | Status: DC | PRN
  Administered 2018-06-24: 1000 mL

## 2018-06-24 MED ORDER — PHENYLEPHRINE HCL 10 MG/ML IJ SOLN
INTRAMUSCULAR | Status: AC
  Filled 2018-06-24: qty 1

## 2018-06-24 MED ORDER — ONDANSETRON HCL 4 MG/2ML IJ SOLN
INTRAMUSCULAR | Status: DC | PRN
  Administered 2018-06-24: 4 mg via INTRAVENOUS

## 2018-06-24 MED ORDER — LACTATED RINGERS IV SOLN
INTRAVENOUS | Status: DC
  Administered 2018-06-24 (×2): via INTRAVENOUS

## 2018-06-24 MED ORDER — FENTANYL CITRATE (PF) 100 MCG/2ML IJ SOLN
25.0000 ug | INTRAMUSCULAR | Status: DC | PRN
  Administered 2018-06-24 (×3): 50 ug via INTRAVENOUS

## 2018-06-24 MED ORDER — DIPHENHYDRAMINE HCL 12.5 MG/5ML PO ELIX: 12.5000 mg | ORAL_SOLUTION | ORAL | Status: DC | PRN

## 2018-06-24 MED ORDER — DOCUSATE SODIUM 100 MG PO CAPS
100.0000 mg | ORAL_CAPSULE | Freq: Two times a day (BID) | ORAL | Status: DC
  Administered 2018-06-24 – 2018-06-26 (×4): 100 mg via ORAL
  Filled 2018-06-24 (×4): qty 1

## 2018-06-24 MED ORDER — HYDROCHLOROTHIAZIDE 25 MG PO TABS
25.0000 mg | ORAL_TABLET | Freq: Every day | ORAL | Status: DC
  Administered 2018-06-25 – 2018-06-26 (×2): 25 mg via ORAL
  Filled 2018-06-24 (×2): qty 1

## 2018-06-24 MED ORDER — HYDROMORPHONE HCL 1 MG/ML IJ SOLN
INTRAMUSCULAR | Status: AC
  Filled 2018-06-24: qty 2

## 2018-06-24 MED ORDER — MORPHINE SULFATE (PF) 4 MG/ML IV SOLN
0.5000 mg | INTRAVENOUS | Status: DC | PRN
  Administered 2018-06-25: 1 mg via INTRAVENOUS
  Filled 2018-06-24: qty 1

## 2018-06-24 MED ORDER — PROPOFOL 10 MG/ML IV BOLUS
INTRAVENOUS | Status: AC
  Filled 2018-06-24: qty 20

## 2018-06-24 MED ORDER — BENAZEPRIL HCL 20 MG PO TABS
40.0000 mg | ORAL_TABLET | Freq: Every day | ORAL | Status: DC
  Administered 2018-06-24 – 2018-06-26 (×3): 40 mg via ORAL
  Filled 2018-06-24: qty 2
  Filled 2018-06-24: qty 4
  Filled 2018-06-24: qty 2
  Filled 2018-06-24: qty 4
  Filled 2018-06-24: qty 2
  Filled 2018-06-24: qty 4

## 2018-06-24 MED ORDER — OXYCODONE HCL 5 MG PO TABS: 5.0000 mg | ORAL_TABLET | Freq: Once | ORAL | Status: DC | PRN

## 2018-06-24 MED ORDER — ACETAMINOPHEN 325 MG PO TABS: 325.0000 mg | ORAL_TABLET | Freq: Four times a day (QID) | ORAL | Status: DC | PRN

## 2018-06-24 MED ORDER — HYDROCODONE-ACETAMINOPHEN 5-325 MG PO TABS
1.0000 | ORAL_TABLET | ORAL | Status: DC | PRN
  Administered 2018-06-24 – 2018-06-26 (×4): 1 via ORAL
  Filled 2018-06-24: qty 2
  Filled 2018-06-24 (×3): qty 1

## 2018-06-24 MED ORDER — CEFAZOLIN SODIUM-DEXTROSE 2-4 GM/100ML-% IV SOLN
2.0000 g | INTRAVENOUS | Status: AC
  Administered 2018-06-24: 2 g via INTRAVENOUS
  Filled 2018-06-24: qty 100

## 2018-06-24 SURGICAL SUPPLY — 50 items
BANDAGE ACE 6X5 VEL STRL LF (GAUZE/BANDAGES/DRESSINGS) ×2 IMPLANT
BANDAGE ELASTIC 6 VELCRO ST LF (GAUZE/BANDAGES/DRESSINGS) ×2 IMPLANT
BIT DRILL 2.5MM SMALL QC EVOS (BIT) IMPLANT
BIT DRILL 2.7 QC CANN 155 (BIT) ×1 IMPLANT
BIT DRILL 2.7 QC CANN 155MM (BIT) ×1
BIT DRILL OVR 3.5AO QC SHRT SM (DRILL) IMPLANT
BIT DRILL QC 2.0 SHORT EVOS SM (DRILL) IMPLANT
CLOTH BEACON ORANGE TIMEOUT ST (SAFETY) ×3 IMPLANT
COVER SURGICAL LIGHT HANDLE (MISCELLANEOUS) ×3 IMPLANT
CUFF TOURN SGL QUICK 34 (TOURNIQUET CUFF) ×3
CUFF TRNQT CYL 34X4X40X1 (TOURNIQUET CUFF) ×1 IMPLANT
DECANTER SPIKE VIAL GLASS SM (MISCELLANEOUS) ×3 IMPLANT
DRAPE C-ARM 42X120 X-RAY (DRAPES) ×3 IMPLANT
DRAPE U-SHAPE 47X51 STRL (DRAPES) ×3 IMPLANT
DRILL 2.5MM SMALL QC EVOS (BIT) ×3
DRILL OVER 3.5 AO QC SHORT SM (DRILL) ×3
DRILL QC 2.0 SHORT EVOS SM (DRILL) ×3
DRSG ADAPTIC 3X8 NADH LF (GAUZE/BANDAGES/DRESSINGS) ×3 IMPLANT
DRSG PAD ABDOMINAL 8X10 ST (GAUZE/BANDAGES/DRESSINGS) ×3 IMPLANT
DURAPREP 26ML APPLICATOR (WOUND CARE) ×3 IMPLANT
ELECT REM PT RETURN 15FT ADLT (MISCELLANEOUS) ×3 IMPLANT
GAUZE SPONGE 4X4 12PLY STRL (GAUZE/BANDAGES/DRESSINGS) ×2 IMPLANT
GAUZE XEROFORM 5X9 LF (GAUZE/BANDAGES/DRESSINGS) ×2 IMPLANT
GLOVE BIO SURGEON STRL SZ7.5 (GLOVE) ×3 IMPLANT
GLOVE BIOGEL PI IND STRL 8 (GLOVE) ×2 IMPLANT
GLOVE BIOGEL PI INDICATOR 8 (GLOVE) ×4
GLOVE ECLIPSE 8.0 STRL XLNG CF (GLOVE) ×3 IMPLANT
GOWN STRL REUS W/TWL XL LVL3 (GOWN DISPOSABLE) ×6 IMPLANT
GUIDE PIN 1.3 (PIN) ×6
PACK TOTAL KNEE CUSTOM (KITS) ×3 IMPLANT
PAD ABD 8X10 STRL (GAUZE/BANDAGES/DRESSINGS) ×4 IMPLANT
PAD CAST 4YDX4 CTTN HI CHSV (CAST SUPPLIES) IMPLANT
PADDING CAST COTTON 4X4 STRL (CAST SUPPLIES) ×3
PADDING CAST COTTON 6X4 STRL (CAST SUPPLIES) ×4 IMPLANT
PIN GUIDE 1.3 (PIN) IMPLANT
PLATE FIB EVOS 81X2.7/3.5 5H (Plate) ×2 IMPLANT
POSITIONER SURGICAL ARM (MISCELLANEOUS) ×3 IMPLANT
SCREW BONE 4.0X10 CANC (Screw) ×2 IMPLANT
SCREW BONE 4.0X12 CANC (Screw) ×2 IMPLANT
SCREW BONE 4.0X16MM OST (Screw) ×2 IMPLANT
SCREW CANN 4.0X14 (Screw) ×2 IMPLANT
SCREW CANN 4.0X48 (Screw) ×4 IMPLANT
SCREW CORT 3.5X10MM ST EVOS (Screw) ×6 IMPLANT
SCREW CORTEX 3.5X18 EVOS (Screw) ×2 IMPLANT
SUT ETHILON 2 0 PS N (SUTURE) ×7 IMPLANT
SUT VIC AB 0 CT1 36 (SUTURE) ×5 IMPLANT
SUT VIC AB 2-0 CT1 27 (SUTURE) ×6
SUT VIC AB 2-0 CT1 TAPERPNT 27 (SUTURE) ×1 IMPLANT
SYR CONTROL 10ML LL (SYRINGE) ×3 IMPLANT
TOWEL OR 17X26 10 PK STRL BLUE (TOWEL DISPOSABLE) ×6 IMPLANT

## 2018-06-24 NOTE — Anesthesia Procedure Notes (Signed)
Procedure Name: LMA Insertion Date/Time: 06/24/2018 2:12 PM Performed by: Marny Lowenstein, CRNA Pre-anesthesia Checklist: Patient identified, Emergency Drugs available, Suction available and Patient being monitored Patient Re-evaluated:Patient Re-evaluated prior to induction Oxygen Delivery Method: Circle system utilized Preoxygenation: Pre-oxygenation with 100% oxygen Induction Type: IV induction Ventilation: Mask ventilation without difficulty LMA: LMA inserted LMA Size: 4.0 Placement Confirmation: positive ETCO2,  CO2 detector and breath sounds checked- equal and bilateral Tube secured with: Tape Dental Injury: Teeth and Oropharynx as per pre-operative assessment

## 2018-06-24 NOTE — Anesthesia Procedure Notes (Signed)
Anesthesia Regional Block: Popliteal block   Pre-Anesthetic Checklist: ,, timeout performed, Correct Patient, Correct Site, Correct Laterality, Correct Procedure, Correct Position, site marked, Risks and benefits discussed,  Surgical consent,  Pre-op evaluation,  At surgeon's request and post-op pain management  Laterality: Right  Prep: chloraprep       Needles:  Injection technique: Single-shot  Needle Type: Echogenic Stimulator Needle          Additional Needles:   Procedures:, nerve stimulator,,,,,,,  Narrative:  Start time: 06/24/2018 1:38 PM End time: 06/24/2018 1:46 PM Injection made incrementally with aspirations every 5 mL.  Performed by: Personally  Anesthesiologist: Achille Rich, MD  Additional Notes: Functioning IV was confirmed and monitors were applied.  A 86mm 21ga Arrow echogenic stimulator needle was used. Sterile prep and drape,hand hygiene and sterile gloves were used.  Negative aspiration and negative test dose prior to incremental administration of local anesthetic. The patient tolerated the procedure well.  Ultrasound guidance: relevent anatomy identified, needle position confirmed, local anesthetic spread visualized around nerve(s), vascular puncture avoided.  Image printed for medical record.

## 2018-06-24 NOTE — Op Note (Signed)
NAME: Kristy [name], Kristy [name] MEDICAL RECORD [id number] ACCOUNT [id number] DATE OF BIRTH:[birth year] FACILITY: [facility] LOCATION: [facility] PHYSICIAN:Heith Haigler Aretha Parrot, MD  OPERATIVE REPORT  DATE OF PROCEDURE:  06/24/2018  PREOPERATIVE DIAGNOSIS:  Right trimalleolar ankle fracture dislocation.  POSTOPERATIVE DIAGNOSIS:  Right trimalleolar ankle fracture dislocation.  PROCEDURE:  Open reduction internal fixation of right trimalleolar ankle fracture dislocation with fixation of the lateral and medial malleolus only.  IMPLANTS:  Smith and Nephew 5-hole lateral distal fibular locking plate with lag screw and bicortical nonlocking screws as well as cancellous screws; 4.0 cannulated screws medially.  SURGEON:  Vanita Panda. Magnus Ivan, MD  ASSISTANT:  Richardean Canal, PA-C  ANESTHESIA: 1.  Right lower extremity popliteal block. 2.  General.  TOURNIQUET TIME:  Just over 1 hour.  ANTIBIOTICS:  2 grams IV Ancef.  ESTIMATED BLOOD LOSS:  Minimal.  COMPLICATIONS:  None.  INDICATIONS:  The patient is a 77 year old very active female who actually dances and bowls who unfortunately tripped on her own porch at home last week injuring her right ankle.  She was actually transported to the emergency room with a right ankle  fracture dislocation and was found to have a trimalleolar ankle fracture.  This was nicely reduced by the ER staff and she was placed in a splint.  We have had her ice and elevate her ankle and now she presents for definitive fixation of this fracture.   The risks and benefits of surgery have been explained to her in detail.  These include the risk of acute blood loss anemia, nerve or vessel injury as well as nonunion of the fracture, fracture failure and posttraumatic arthritic changes.  She understands  her goals are to decrease pain, improve mobility and overall improve quality of life.  She also understands she will be nonweightbearing on this ankle for about a period of four to  six weeks.  DESCRIPTION OF PROCEDURE:  After informed consent was obtained, appropriate right ankle was marked.  She was brought to the operating room and placed supine on the operating table.  Popliteal block obtained in the holding room.  General anesthesia was  obtained while she was on the operating table.  A nonsterile tourniquet was placed around the upper right thigh and her right knee, leg, foot and ankle were prepped and draped with DuraPrep and sterile drapes.  I found abundant ankle swelling, but no  occult soft tissue compromise and it did look like we could proceed with surgery.  Timeout was called to identify correct patient, correct right ankle.  We then used an Esmarch to wrap that leg and tourniquet was inflated to 300 mm of pressure.  I then  made an incision over the fibula on the lateral aspect of the ankle and carried this proximally and distally.  I dissected down to the ankle fracture itself and we cleaned the fracture of debris.  This was a Weber B oblique fracture level of the ankle  mortise.  Using reduction forceps, we were able to anatomically reduce the fracture and we placed a single lag screw from anterior to posterior.  We then chose a Smith and Nephew lateral distal fibular locking plate and fashioned this over the lateral  cortex of the fibula.  We secured this with bicortical screws proximally and then smaller cancellous screws distally.  This did reduce the ankle fracture completely.  We then went to the medial side and made a separate medial incision.  We were able to  dissect down to the medial ankle  joint and found the medial malleolus fracture.  We were able to irrigate out the ankle joint itself.  We then placed 2 temporary guide pins holding the fracture in reduced position, verified its placement under direct  fluoroscopy and then placed 2 partially threaded 4.0 cancellous screws to the medial malleolus plates and removed the guide pins.  Under direct fluoroscopy  in the lateral medial planes, we stressed the ankle mortise and it was completely intact and the  ankle joint stayed congruent.  The posterior malleolus piece was less than 10% of the joint surface.  We then irrigated both medial and lateral wounds with normal saline solution.  We closed the deep tissue over hardware with 0 Vicryl followed by 2-0  Vicryl subcutaneous tissue and interrupted 2-0 nylon in the skin on both the medial and lateral incisions.  Xeroform well-padded sterile dressing was applied.  Tourniquet was let down.  Toes pinked up nicely.  She was awakened, extubated, and taken to recovery room in stable condition.  All final counts were correct.  No complications noted.  Of note, Rexene Edison, PA-C, assisted the entire case and assistance was  crucial for facilitating all aspects of this case.  TN/NUANCE  D:06/24/2018 T:06/24/2018 JOB:[id number]

## 2018-06-24 NOTE — Anesthesia Preprocedure Evaluation (Signed)
Anesthesia Evaluation  Patient identified by MRN, date of birth, ID band Patient awake    Reviewed: Allergy & Precautions, H&P , NPO status , Patient's Chart, lab work & pertinent test results  Airway Mallampati: II   Neck ROM: full    Dental   Pulmonary asthma , former smoker,    breath sounds clear to auscultation       Cardiovascular hypertension,  Rhythm:regular Rate:Normal     Neuro/Psych    GI/Hepatic GERD  ,  Endo/Other    Renal/GU      Musculoskeletal  (+) Arthritis ,   Abdominal   Peds  Hematology   Anesthesia Other Findings   Reproductive/Obstetrics                             Anesthesia Physical Anesthesia Plan  ASA: II  Anesthesia Plan: General   Post-op Pain Management:  Regional for Post-op pain   Induction: Intravenous  PONV Risk Score and Plan: 3 and Ondansetron, Dexamethasone and Treatment may vary due to age or medical condition  Airway Management Planned: LMA  Additional Equipment:   Intra-op Plan:   Post-operative Plan:   Informed Consent: I have reviewed the patients History and Physical, chart, labs and discussed the procedure including the risks, benefits and alternatives for the proposed anesthesia with the patient or authorized representative who has indicated his/her understanding and acceptance.     Plan Discussed with: CRNA, Anesthesiologist and Surgeon  Anesthesia Plan Comments:         Anesthesia Quick Evaluation

## 2018-06-24 NOTE — Progress Notes (Signed)
May give dilaudid per pacu protocol. - Dr Chaney Malling

## 2018-06-24 NOTE — Brief Op Note (Signed)
06/24/2018  3:38 PM  PATIENT:  [name]  77 y.o. female  PRE-OPERATIVE DIAGNOSIS:  right trimalleolar ankle fracture  POST-OPERATIVE DIAGNOSIS:  right trimalleolar ankle fracture  PROCEDURE:  Procedure(s): OPEN REDUCTION INTERNAL FIXATION (ORIF) RIGHT ANKLE FRACTURE (Right)  SURGEON:  Surgeon(s) and Role:    Kathryne Hitch, MD - Primary  PHYSICIAN ASSISTANT: Rexene Edison, PA-C   ANESTHESIA:   regional and general  EBL:  25 mL   COUNTS:  YES  TOURNIQUET:   Total Tourniquet Time Documented: Thigh (Right) - 63 minutes Total: Thigh (Right) - 63 minutes   DICTATION: .Other Dictation: Dictation Number [id number]  PLAN OF CARE: Admit to inpatient   PATIENT DISPOSITION:  PACU - hemodynamically stable.   Delay start of Pharmacological VTE agent (>24hrs) due to surgical blood loss or risk of bleeding: no

## 2018-06-24 NOTE — Transfer of Care (Signed)
Immediate Anesthesia Transfer of Care Note  Patient: Kristy [name]  Procedure(s) Performed: OPEN REDUCTION INTERNAL FIXATION (ORIF) RIGHT ANKLE FRACTURE (Right Ankle)  Patient Location: PACU  Anesthesia Type:General and Regional  Level of Consciousness: awake, alert  and oriented  Airway & Oxygen Therapy: Patient Spontanous Breathing and Patient connected to face mask oxygen  Post-op Assessment: Report given to RN and Post -op Vital signs reviewed and stable  Post vital signs: Reviewed and stable  Last Vitals:  Vitals Value Taken Time  BP 133/79 06/24/2018  3:50 PM  Temp 36.3 C 06/24/2018  3:50 PM  Pulse 85 06/24/2018  4:00 PM  Resp 16 06/24/2018  4:00 PM  SpO2 100 % 06/24/2018  4:00 PM  Vitals shown include unvalidated device data.  Last Pain:  Vitals:   06/24/18 1221  TempSrc: Oral         Complications: No apparent anesthesia complications

## 2018-06-24 NOTE — H&P (Signed)
Kristy [name] is an 77 y.o. female.   Chief Complaint:   Right ankle pain with known fracture HPI:   77 yo female with a right unstable trimalleolar ankle fracture who sustained this injury after an accidental mechanical fall last weekend.  She was seen in the ED and the fracture was reduced and splinted.  She now presents for definitive fixation of her broken right ankle.  This has been recommended due to the instabililty of this break.  Past Medical History:  Diagnosis Date  . Allergic rhinitis, seasonal   . Arthritis   . Asthma   . GERD (gastroesophageal reflux disease)   . History of colon polyps   . Hypertension   . Rotator cuff tear, right     Past Surgical History:  Procedure Laterality Date  . CHOLECYSTECTOMY OPEN  AGE 44  . INGUINAL HERNIA REPAIR Right AGE 44s  . KNEE ARTHROSCOPY Left 2014  . LUMBAR DISC SURGERY  x2  LAST DATE EARLY 2000s  . ROTATOR CUFF REPAIR Left 01/2015  . TONSILLECTOMY  AGE 28  . VAGINAL HYSTERECTOMY  AGE 39   WITH BSO    Family History  Problem Relation Age of Onset  . Colon cancer Neg Hx   . Esophageal cancer Neg Hx   . Rectal cancer Neg Hx   . Stomach cancer Neg Hx    Social History:  reports that she quit smoking about 29 years ago. She quit after 10.00 years of use. She has never used smokeless tobacco. She reports that she drinks about 3.0 - 4.0 standard drinks of alcohol per week. She reports that she does not use drugs.  Allergies:  Allergies  Allergen Reactions  . Aspirin Itching  . Clonidine Hives    Medications Prior to Admission  Medication Sig Dispense Refill  . ADVAIR DISKUS 250-50 MCG/DOSE AEPB INHALE 1 PUFF INTO THE LUNGS EVERY 12 (TWELVE) HOURS FOR SHORTNESS OF BREATH AND WHEEZING (Patient taking differently: Inhale 1 puff into the lungs every 12 (twelve) hours. ) 60 each 1  . benazepril (LOTENSIN) 40 MG tablet TAKE 1 TABLET (40 MG TOTAL) BY MOUTH DAILY. 90 tablet 0  . celecoxib (CELEBREX) 200 MG capsule TAKE 1 CAPSULE BY  MOUTH EVERY DAY 90 capsule 1  . fluticasone (FLONASE) 50 MCG/ACT nasal spray SPRAY 2 SPRAYS INTO EACH NOSTRIL EVERY DAY (Patient taking differently: Place 2 sprays into both nostrils daily. ) 16 g 5  . hydrochlorothiazide (HYDRODIURIL) 25 MG tablet TAKE 1 TABLET BY MOUTH EVERY DAY 90 tablet 3  . albuterol (PROVENTIL HFA;VENTOLIN HFA) 108 (90 Base) MCG/ACT inhaler Inhale 2 puffs into the lungs every 6 (six) hours as needed for wheezing. 1 each 0  . COLLAGEN PO Take by mouth. Collagen powder-mix with coffee and drink daily    . Liniments (SALONPAS PAIN RELIEF PATCH EX) Apply 1 patch topically daily as needed (pain).    . Menthol, Topical Analgesic, (BLUE-EMU MAXIMUM STRENGTH EX) Apply 1 application topically daily.    . traMADol (ULTRAM) 50 MG tablet TAKE 1 TABLET BY MOUTH EVERY 6 HOURS 90 tablet 0    No results found for this or any previous visit (from the past 48 hour(s)). No results found.  Review of Systems  All other systems reviewed and are negative.   Blood pressure 133/73, pulse 82, temperature 97.8 F (36.6 C), temperature source Oral, resp. rate 18, height 5' (1.524 m), weight 70.8 kg, SpO2 97 %. Physical Exam  Constitutional: She is oriented to person,  place, and time. She appears well-developed and well-nourished.  HENT:  Head: Normocephalic and atraumatic.  Eyes: Pupils are equal, round, and reactive to light. EOM are normal.  Neck: Normal range of motion. Neck supple.  Cardiovascular: Normal rate.  Respiratory: Effort normal and breath sounds normal.  GI: Soft. Bowel sounds are normal.  Musculoskeletal:       Right ankle: She exhibits decreased range of motion, swelling, ecchymosis and deformity. Tenderness. Lateral malleolus, medial malleolus and posterior TFL tenderness found.  Neurological: She is alert and oriented to person, place, and time.  Skin: Skin is warm and dry.  Psychiatric: She has a normal mood and affect.     Assessment/Plan Right ankle with  trimalleolar fracture that is unstable.  To the OR today for surgical fixation of her fractured right ankle.  This has been discussed in detail and an explanation of the risks and befits of surgery has been had.  Informed consent is obtained.  Kathryne Hitch, MD 06/24/2018, 1:52 PM

## 2018-06-25 DIAGNOSIS — I10 Essential (primary) hypertension: Secondary | ICD-10-CM | POA: Diagnosis not present

## 2018-06-25 DIAGNOSIS — S82851A Displaced trimalleolar fracture of right lower leg, initial encounter for closed fracture: Secondary | ICD-10-CM | POA: Diagnosis not present

## 2018-06-25 DIAGNOSIS — Z79899 Other long term (current) drug therapy: Secondary | ICD-10-CM | POA: Diagnosis not present

## 2018-06-25 DIAGNOSIS — K219 Gastro-esophageal reflux disease without esophagitis: Secondary | ICD-10-CM | POA: Diagnosis not present

## 2018-06-25 DIAGNOSIS — M1990 Unspecified osteoarthritis, unspecified site: Secondary | ICD-10-CM | POA: Diagnosis not present

## 2018-06-25 DIAGNOSIS — J45909 Unspecified asthma, uncomplicated: Secondary | ICD-10-CM | POA: Diagnosis not present

## 2018-06-25 LAB — BASIC METABOLIC PANEL
ANION GAP: 11 (ref 5–15)
BUN: 13 mg/dL (ref 8–23)
CALCIUM: 9.2 mg/dL (ref 8.9–10.3)
CO2: 26 mmol/L (ref 22–32)
CREATININE: 0.66 mg/dL (ref 0.44–1.00)
Chloride: 103 mmol/L (ref 98–111)
GFR calc non Af Amer: >60 mL/min (ref >60)
Glucose, Bld: 176 mg/dL — ABNORMAL HIGH (ref 70–99)
Potassium: 3.8 mmol/L (ref 3.5–5.1)
Sodium: 140 mmol/L (ref 135–145)

## 2018-06-25 MED ORDER — HYDROCODONE-ACETAMINOPHEN 5-325 MG PO TABS: 1.0000 | ORAL_TABLET | ORAL | 0 refills | Status: DC | PRN

## 2018-06-25 NOTE — Evaluation (Signed)
Occupational Therapy Evaluation Patient Details Name: Kristy [name] MRN: [id number] DOB: 1941/06/02 Today's Date: 06/25/2018    History of Present Illness 77 yo female s/p ORIF R ankle s/p fall PMH: L TSA, L knee surg x2 , R shoulder pending surgery, GERD, arthritis   Clinical Impression   Patient is s/p ORIF R ankle surgery resulting in functional limitations due to the deficits listed below (see OT problem list). Pt currently requires (A) for basic transfer min guard (A) for pivot to w/c level. Pt will requires use of w/c in the home and was completing this previous. Pt requesting information for home health aide to help with herself and spouse.  Patient will benefit from skilled OT acutely to increase independence and safety with ADLS to allow discharge HHOT.     Follow Up Recommendations  Home health OT    Equipment Recommendations  Wheelchair (measurements OT);Other (comment)(RW)    Recommendations for Other Services Other (comment)(home aide)     Precautions / Restrictions Precautions Precautions: Fall Restrictions Weight Bearing Restrictions: Yes RLE Weight Bearing: Non weight bearing      Mobility Bed Mobility Overal bed mobility: Modified Independent                Transfers Overall transfer level: Needs assistance   Transfers: Stand Pivot Transfers   Stand pivot transfers: Min guard       General transfer comment: transfer to the L with squat pivot . pt does not use the RW at baseline. NExt session to focus on RW use for 3n1    Balance                                           ADL either performed or assessed with clinical judgement   ADL Overall ADL's : Needs assistance/impaired Eating/Feeding: Independent;Sitting   Grooming: Wash/dry hands;Wash/dry face;Supervision/safety;Sitting Grooming Details (indicate cue type and reason): requires seated position Upper Body Bathing: Modified independent;Sitting   Lower Body Bathing:  Modified independent;Bed level Lower Body Bathing Details (indicate cue type and reason): able to roll R and L for peri care Upper Body Dressing : Modified independent;Sitting       Toilet Transfer: Min Manufacturing systems engineer Details (indicate cue type and reason): pt pivot to the Left side with maintaing NWB R LE       Tub/Shower Transfer Details (indicate cue type and reason): describes transfer to edge of tub and sitting on the rim to complete bathing with hand held shower head prior to surg   General ADL Comments: pt reports using spouse w/c at this time but needs her own. pt will be home alone during the day with family (A) PRN. family currently bring food in the morning / leaving lunch out and returning to provide dinners at night. Spouse requires (A) due to respiratory issues and pt reports I cant really help him right now but if i am not home he would be alone with the dog. Pt wishes to return home     Vision Baseline Vision/History: Wears glasses Wears Glasses: Reading only;At all times(my drivers licenses says all the time but i just do reading)       Perception     Praxis      Pertinent Vitals/Pain Pain Assessment: 0-10 Pain Score: 4  Pain Location: R LE Pain Descriptors / Indicators: Operative site guarding Pain Intervention(s): Monitored during session;Premedicated  before session;Repositioned;Ice applied     Hand Dominance Right   Extremity/Trunk Assessment Upper Extremity Assessment Upper Extremity Assessment: RUE deficits/detail;LUE deficits/detail RUE Deficits / Details: pending surgery for R UE LUE Deficits / Details: L UE x2 surgeries   Lower Extremity Assessment Lower Extremity Assessment: RLE deficits/detail;LLE deficits/detail RLE Deficits / Details: s/p surgery no sensation at toes / no movement LLE Deficits / Details: x2 knee surgeries   Cervical / Trunk Assessment Cervical / Trunk Assessment: Other exceptions(s/p x2 back surgeries)    Communication Communication Communication: No difficulties   Cognition Arousal/Alertness: Awake/alert Behavior During Therapy: WFL for tasks assessed/performed Overall Cognitive Status: Within Functional Limits for tasks assessed                                     General Comments  ice applied and elevated    Exercises     Shoulder Instructions      Home Living Family/patient expects to be discharged to:: Private residence Living Arrangements: Spouse/significant other Available Help at Discharge: Friend(s);Available PRN/intermittently Type of Home: House Home Access: Stairs to enter Entrance Stairs-Number of Steps: 3   Home Layout: Two level;Able to live on main level with bedroom/bathroom Alternate Level Stairs-Number of Steps: 16   Bathroom Shower/Tub: Chief Strategy Officer: Handicapped height     Home Equipment: Wheelchair - manual;Hand held shower head;Bedside commode(was using crutches and walker from spouse but needs her own)   Additional Comments: 51 yo dog in the home 80 pounds      Prior Functioning/Environment Level of Independence: Independent        Comments: 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         OT Problem List: Decreased strength;Decreased range of motion;Decreased activity tolerance;Impaired balance (sitting and/or standing);Decreased safety awareness;Decreased knowledge of use of DME or AE;Decreased knowledge of precautions;Cardiopulmonary status limiting activity;Impaired UE functional use;Pain;Increased edema;Impaired sensation      OT Treatment/Interventions: Self-care/ADL training;Therapeutic exercise;Neuromuscular education;Energy conservation;DME and/or AE instruction;Manual therapy;Splinting;Therapeutic activities;Patient/family education;Balance training    OT Goals(Current goals can be found in the care plan section) Acute Rehab OT Goals Patient Stated Goal: to return home  OT Goal  Formulation: With patient Time For Goal Achievement: 07/09/18 Potential to Achieve Goals: Good  OT Frequency: Min 2X/week   Barriers to D/C: Decreased caregiver support          Co-evaluation              AM-PAC PT "6 Clicks" Daily Activity     Outcome Measure Help from another person eating meals?: None Help from another person taking care of personal grooming?: None Help from another person toileting, which includes using toliet, bedpan, or urinal?: A Little Help from another person bathing (including washing, rinsing, drying)?: A Little Help from another person to put on and taking off regular upper body clothing?: None Help from another person to put on and taking off regular lower body clothing?: A Little 6 Click Score: 21   End of Session Nurse Communication: Mobility status;Precautions  Activity Tolerance: Patient tolerated treatment well Patient left: in chair;with call bell/phone within reach;with chair alarm set  OT Visit Diagnosis: Unsteadiness on feet (R26.81);Muscle weakness (generalized) (M62.81);Pain Pain - Right/Left: Right Pain - part of body: Ankle and joints of foot                Time: 4259-5638 OT Time Calculation (min):  42 min Charges:  OT General Charges $OT Visit: 1 Visit OT Evaluation $OT Eval Moderate Complexity: 1 Mod OT Treatments $Self Care/Home Management : 23-37 mins   Mateo Flow, OTR/L  Acute Rehabilitation Services Pager: [phone] Office: [phone] .   Boone Master B 06/25/2018, 9:26 AM

## 2018-06-25 NOTE — Evaluation (Signed)
Physical Therapy Evaluation Patient Details Name: Kristy [name] MRN: [id number] DOB: 29-Jul-1941 Today's Date: 06/25/2018   History of Present Illness  77 yo female s/p ORIF R ankle s/p fall PMH: L TSA, L knee surg x2 , R shoulder pending surgery, GERD, arthritis  Clinical Impression  Pt very motivated and doing well;  She is concerned about going home and managing as she takes care of her husband, she has supportive children but they all work;  we discussed trying the knee walker tomorrow as this may be an  option for pt to allow her to be more independent at home when performing  household management and still maintain NWB    Follow Up Recommendations Home health PT    Equipment Recommendations  Rolling walker with 5" wheels    Recommendations for Other Services       Precautions / Restrictions Precautions Precautions: Fall Restrictions RLE Weight Bearing: Non weight bearing      Mobility  Bed Mobility Overal bed mobility: Modified Independent             General bed mobility comments: requires incr time, no physical assist  Transfers Overall transfer level: Needs assistance Equipment used: Rolling walker (2 wheeled);None Transfers: Pharmacologist;Sit to/from Stand Sit to Stand: Min guard Stand pivot transfers: Min guard(squat pivot )       General transfer comment: sit<>stand to RW with min/guard assist; cues for hand placement; pt performs squat pivot x 3 with min/guard, able to maintain NWB   Ambulation/Gait Ambulation/Gait assistance: Min assist Gait Distance (Feet): 3 Feet Assistive device: Rolling walker (2 wheeled)       General Gait Details: assist to balance when wt shifting, able to maintain NWB  Stairs            Wheelchair Mobility    Modified Rankin (Stroke Patients Only)       Balance Overall balance assessment: Needs assistance Sitting-balance support: No upper extremity supported;Feet supported Sitting balance-Leahy  Scale: Good       Standing balance-Leahy Scale: Poor Standing balance comment: reliant on UEs                             Pertinent Vitals/Pain Pain Score: 3     Home Living Family/patient expects to be discharged to:: Private residence Living Arrangements: Spouse/significant other Available Help at Discharge: Friend(s);Available PRN/intermittently Type of Home: House Home Access: Stairs to enter Entrance Stairs-Rails: Right;Left Entrance Stairs-Number of Steps: 3 Home Layout: Two level;Able to live on main level with bedroom/bathroom Home Equipment: Wheelchair - manual;Hand held shower head;Bedside commode Additional Comments: 14 yo dog in the home 80 pounds    Prior Function Level of Independence: Independent         Comments: 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      Hand Dominance        Extremity/Trunk Assessment        Lower Extremity Assessment RLE Deficits / Details: moves toes, sensation returning to distal foot  LLE Deficits / Details: WFL       Communication   Communication: No difficulties  Cognition                                              General Comments      Exercises  Assessment/Plan    PT Assessment Patient needs continued PT services  PT Problem List Decreased strength;Decreased range of motion;Decreased activity tolerance;Decreased mobility;Decreased balance;Decreased knowledge of use of DME       PT Treatment Interventions DME instruction;Gait training;Therapeutic activities;Therapeutic exercise;Functional mobility training;Patient/family education;Balance training    PT Goals (Current goals can be found in the Care Plan section)  Acute Rehab PT Goals Patient Stated Goal: to return home and be able to to take care of her husband PT Goal Formulation: With patient Time For Goal Achievement: 07/02/18 Potential to Achieve Goals: Good    Frequency Min 6X/week   Barriers to  discharge Decreased caregiver support family placing a ramp per pt; 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 : A Little Difficulty sitting down on and standing up from a chair with arms (e.g., wheelchair, bedside commode, etc,.)?: A Little Help needed moving to and from a bed to chair (including a wheelchair)?: A Little Help needed walking in hospital room?: A Lot Help needed climbing 3-5 steps with a railing? : A Lot 6 Click Score: 3    End of Session Equipment Utilized During Treatment: Gait belt Activity Tolerance: Patient tolerated treatment well Patient left: in chair;with chair alarm set;with call bell/phone within reach   PT Visit Diagnosis: Unsteadiness on feet (R26.81);Difficulty in walking, not elsewhere classified (R26.2);History of falling (Z91.81)    Time: 0175-1025 PT Time Calculation (min) (ACUTE ONLY): 35 min   Charges:   PT Evaluation $PT Eval Low Complexity: 1 Low PT Treatments $Therapeutic Activity: 8-22 mins        Drucilla Chalet, PT Pager: [phone] 06/25/2018   [facility] Acute [facility]hab Dept [phone]   [facility] 06/25/2018, 4:30 PM

## 2018-06-25 NOTE — Progress Notes (Signed)
Subjective: 1 Day Post-Op Procedure(s) (LRB): OPEN REDUCTION INTERNAL FIXATION (ORIF) RIGHT ANKLE FRACTURE (Right) Patient reports pain as moderate.  Of more concern is her swelling in her lips and face this am.  Not sure if it is a reaction to meds.  No breathing difficulties.  Objective: Vital signs in last 24 hours: Temp:  [97.4 F (36.3 C)-97.9 F (36.6 C)] 97.9 F (36.6 C) (09/07 1003) Pulse Rate:  [71-101] 82 (09/07 1003) Resp:  [10-18] 15 (09/07 1003) BP: (129-161)/(73-97) 161/80 (09/07 1003) SpO2:  [92 %-100 %] 96 % (09/07 1003) Weight:  [70.8 kg] 70.8 kg (09/06 1221)  Intake/Output from previous day: 09/06 0701 - 09/07 0700 In: 2311.9 [P.O.:180; I.V.:2031.9; IV Piggyback:100] Out: 1745 [Urine:1720; Blood:25] Intake/Output this shift: Total I/O In: 240 [P.O.:240] Out: 75 [Urine:75]  No results for input(s): HGB in the last 72 hours. No results for input(s): WBC, RBC, HCT, PLT in the last 72 hours. Recent Labs    06/25/18 0342  NA 140  K 3.8  CL 103  CO2 26  BUN 13  CREATININE 0.66  GLUCOSE 176*  CALCIUM 9.2   No results for input(s): LABPT, INR in the last 72 hours.  Intact pulses distally Dorsiflexion/Plantar flexion intact Incision: dressing C/D/I  Assessment/Plan: 1 Day Post-Op Procedure(s) (LRB): OPEN REDUCTION INTERNAL FIXATION (ORIF) RIGHT ANKLE FRACTURE (Right) Up with therapy Plan for discharge tomorrow Discharge home with home health   Will be non-weight bearing on her right ankle. Can have cam walker.    Kathryne Hitch 06/25/2018, 11:22 AM

## 2018-06-25 NOTE — Discharge Instructions (Signed)
Non weight at all on your right ankle. Expect swelling - ice and elevation as needed. Keep your right ankle dressing clean and dry. If you would like, you can remove your dressings in 6 days and get your incisions wet in the shower. New dry dressing as needed in 6 days.

## 2018-06-26 ENCOUNTER — Other Ambulatory Visit (INDEPENDENT_AMBULATORY_CARE_PROVIDER_SITE_OTHER): Payer: Self-pay | Admitting: Orthopaedic Surgery

## 2018-06-26 DIAGNOSIS — M1990 Unspecified osteoarthritis, unspecified site: Secondary | ICD-10-CM | POA: Diagnosis not present

## 2018-06-26 DIAGNOSIS — K219 Gastro-esophageal reflux disease without esophagitis: Secondary | ICD-10-CM | POA: Diagnosis not present

## 2018-06-26 DIAGNOSIS — J45909 Unspecified asthma, uncomplicated: Secondary | ICD-10-CM | POA: Diagnosis not present

## 2018-06-26 DIAGNOSIS — I10 Essential (primary) hypertension: Secondary | ICD-10-CM | POA: Diagnosis not present

## 2018-06-26 DIAGNOSIS — S82851A Displaced trimalleolar fracture of right lower leg, initial encounter for closed fracture: Secondary | ICD-10-CM | POA: Diagnosis not present

## 2018-06-26 DIAGNOSIS — Z79899 Other long term (current) drug therapy: Secondary | ICD-10-CM | POA: Diagnosis not present

## 2018-06-26 LAB — BASIC METABOLIC PANEL
Anion gap: 13 (ref 5–15)
BUN: 13 mg/dL (ref 8–23)
CHLORIDE: 102 mmol/L (ref 98–111)
CO2: 26 mmol/L (ref 22–32)
Calcium: 9 mg/dL (ref 8.9–10.3)
Creatinine, Ser: 0.62 mg/dL (ref 0.44–1.00)
GFR calc Af Amer: >60 mL/min (ref >60)
GFR calc non Af Amer: >60 mL/min (ref >60)
GLUCOSE: 110 mg/dL — AB (ref 70–99)
Potassium: 3.3 mmol/L — ABNORMAL LOW (ref 3.5–5.1)
Sodium: 141 mmol/L (ref 135–145)

## 2018-06-26 NOTE — Progress Notes (Signed)
PT NOTE   Patient suffers from  ORIF  R ankle which impairs their ability to perform daily activities like transfers, ambulation, functional mobility safely  in the home.  A walker alone will not resolve the issues with performing activities of daily living. A light weight wheelchair  With ELRs will allow patient to safely perform daily activities.  The patient can self propel in the home or has a caregiver who can provide assistance.     Drucilla Chalet, PT Pager: [phone] 06/26/2018   [facility] Acute [facility]hab Dept [phone]

## 2018-06-26 NOTE — Progress Notes (Signed)
    Durable Medical Equipment  (From admission, onward)         Start     Ordered   06/26/18 1016  For home use only DME lightweight manual wheelchair with seat cushion  Once    Comments:  Patient suffers from right ankle fracture s/p ORIF which impairs their ability to perform daily activities like standing, dressing, bathing in the home.  A rolling walker will not resolve  issue with performing activities of daily living. A wheelchair will allow patient to safely perform daily activities. Patient is not able to propel themselves in the home using a standard weight wheelchair due to pain, previous shoulder surgery. Patient can self propel in the lightweight wheelchair.  Accessories: elevating leg rests (ELRs), wheel locks, extensions and anti-tippers, seat cushion   06/26/18 1018                          Marcy Siren, OT [employer] Pager [phone] Office [phone]

## 2018-06-26 NOTE — Progress Notes (Signed)
   Subjective: 2 Days Post-Op Procedure(s) (LRB): OPEN REDUCTION INTERNAL FIXATION (ORIF) RIGHT ANKLE FRACTURE (Right) Patient reports pain as mild.    Objective: Vital signs in last 24 hours: Temp:  [97.8 F (36.6 C)-98.1 F (36.7 C)] 97.8 F (36.6 C) (09/08 0621) Pulse Rate:  [69-82] 81 (09/08 0621) Resp:  [14-16] 14 (09/08 0621) BP: (161-176)/(80-94) 171/94 (09/08 0621) SpO2:  [96 %-100 %] 100 % (09/08 0621)  Intake/Output from previous day: 09/07 0701 - 09/08 0700 In: 1152.3 [P.O.:900; I.V.:152.3; IV Piggyback:100] Out: 75 [Urine:75] Intake/Output this shift: No intake/output data recorded.  No results for input(s): HGB in the last 72 hours. No results for input(s): WBC, RBC, HCT, PLT in the last 72 hours. Recent Labs    06/25/18 0342 06/26/18 0349  NA 140 141  K 3.8 3.3*  CL 103 102  CO2 26 26  BUN 13 13  CREATININE 0.66 0.62  GLUCOSE 176* 110*  CALCIUM 9.2 9.0   No results for input(s): LABPT, INR in the last 72 hours.  Neurologically intact No results found.  Assessment/Plan: 2 Days Post-Op Procedure(s) (LRB): OPEN REDUCTION INTERNAL FIXATION (ORIF) RIGHT ANKLE FRACTURE (Right) Up with therapy, discharge home today. Needs CAM BOOT.   Kristy [name] 06/26/2018, 9:36 AM

## 2018-06-26 NOTE — Progress Notes (Signed)
Physical Therapy Treatment Patient Details Name: Kristy [name] MRN: [id number] DOB: [birth year] Today's Date: 06/26/2018    History of Present Illness 77 yo female s/p ORIF R ankle s/p fall PMH: L TSA, L knee surg x2 , R shoulder pending surgery, GERD, arthritis    PT Comments    Pt progressing toward goals; family to have ramp today at home per pt report; fatigues easily with amb d/t decr activity since fall; will need s/c for home use;  Follow Up Recommendations  Home health PT     Equipment Recommendations  Wheelchair (measurements PT)    Recommendations for Other Services       Precautions / Restrictions Precautions Precautions: Fall Restrictions Weight Bearing Restrictions: Yes RLE Weight Bearing: Non weight bearing    Mobility  Bed Mobility Overal bed mobility: Modified Independent             General bed mobility comments: requires incr time, no physical assist, performed from flat bed   Transfers Overall transfer level: Needs assistance Equipment used: None Transfers: Sit to/from Stand Sit to Stand: Min guard Stand pivot transfers: Min guard Squat pivot transfers: Min guard     General transfer comment: min/guard for safety during stand pivot to/from  knee walker  Ambulation/Gait Ambulation/Gait assistance: Min guard Gait Distance (Feet): 50 Feet Assistive device: (knee walker)       General Gait Details: min/guard for safety, cues for wt shifting to R knee and to maneuver scooter; mildly  fatigued after amb     Stairs             Wheelchair Mobility    Modified Rankin (Stroke Patients Only)       Balance Overall balance assessment: Needs assistance Sitting-balance support: No upper extremity supported;Feet supported Sitting balance-Leahy Scale: Good       Standing balance-Leahy Scale: Poor Standing balance comment: reliant on UEs                            Cognition Arousal/Alertness: Awake/alert Behavior  During Therapy: WFL for tasks assessed/performed Overall Cognitive Status: Within Functional Limits for tasks assessed                                        Exercises Other Exercises Other Exercises: wiggles toes  Other Exercises: encouraged  AROM right knee and hip    General Comments        Pertinent Vitals/Pain Pain Assessment: 0-10 Pain Score: 3  Pain Location: R LE Pain Descriptors / Indicators: Operative site guarding Pain Intervention(s): Monitored during session;Repositioned    Home Living                      Prior Function            PT Goals (current goals can now be found in the care plan section) Acute Rehab PT Goals Patient Stated Goal: hopeful for home today  PT Goal Formulation: With patient Time For Goal Achievement: 07/02/18 Potential to Achieve Goals: Good Progress towards PT goals: Progressing toward goals    Frequency    Min 6X/week      PT Plan Current plan remains appropriate    Co-evaluation              AM-PAC PT "6 Clicks" Daily Activity  Outcome Measure  Difficulty turning  over in bed (including adjusting bedclothes, sheets and blankets)?: A Little Difficulty moving from lying on back to sitting on the side of the bed? : A Little Difficulty sitting down on and standing up from a chair with arms (e.g., wheelchair, bedside commode, etc,.)?: A Little Help needed moving to and from a bed to chair (including a wheelchair)?: A Little Help needed walking in hospital room?: A Little Help needed climbing 3-5 steps with a railing? : Total 6 Click Score: 16    End of Session Equipment Utilized During Treatment: Gait belt Activity Tolerance: Patient tolerated treatment well Patient left: with call bell/phone within reach;in bed   PT Visit Diagnosis: Unsteadiness on feet (R26.81);Difficulty in walking, not elsewhere classified (R26.2);History of falling (Z91.81)     Time: 3435-6861 PT Time Calculation  (min) (ACUTE ONLY): 20 min  Charges:  $Gait Training: 8-22 mins                     Drucilla Chalet, [place] Pager: [phone] 06/26/2018   [facility] Dept [phone]    [facility] 06/26/2018, 11:42 AM

## 2018-06-26 NOTE — Care Management Note (Signed)
Case Management Note  Patient Details  Name: Kristy [name] MRN: [id number] Date of Birth: [birth year]  Subjective/Objective:   S/p ORIF right ankle, fall                Action/Plan: NCM spoke to pt and offered choice for HH/list provided. Pt agreeable to [facility] for HH. Pt will need wheelchair for home. Contacted [facility] for wheelchair. Pt has a 3n1 bedside commode at home. Pt will pick up RW at medical supply store or pharmacy.   Expected Discharge Date:  06/26/18               Expected Discharge Plan:  Home w Home Health Services  In-House Referral:  NA  Discharge planning Services  CM Consult  Post Acute Care Choice:  Home Health Choice offered to:  Patient  DME Arranged:  Community education officer wheelchair with seat cushion DME Agency:  [facility]  HH Arranged:  PT [facility] Agency:  [facility]  Status of Service:  Completed, signed off  If discussed at Long Length of Stay Meetings, dates discussed:    Additional Comments:  Elliot Cousin, RN 06/26/2018, 10:24 AM

## 2018-06-26 NOTE — Progress Notes (Signed)
Occupational Therapy Treatment Patient Details Name: Kristy [name] MRN: [id number] DOB: 1941-04-20 Today's Date: 06/26/2018    History of present illness 77 yo female s/p ORIF R ankle s/p fall PMH: L TSA, L knee surg x2 , R shoulder pending surgery, GERD, arthritis   OT comments  Pt progressing towards OT goals, presents supine in bed pleasant and willing to participate in therapy session. Pt performing squat pivot transfers during session with minguard assist and demonstrating ability to maintain NWB status throughout. Further discussion held regarding safety and compensatory strategies for completing ADLs and functional transfers after return home with pt verbalizing understanding. Feel POC remains appropriate. Will continue to follow while she remains in acute setting.    Follow Up Recommendations  Home health OT    Equipment Recommendations  Wheelchair (measurements OT);Other (comment)(RW)    Recommendations for Other Services Other (comment)(home health aide)    Precautions / Restrictions Precautions Precautions: Fall Restrictions Weight Bearing Restrictions: Yes RLE Weight Bearing: Non weight bearing       Mobility Bed Mobility Overal bed mobility: Modified Independent             General bed mobility comments: requires incr time, no physical assist, performed from flat bed   Transfers Overall transfer level: Needs assistance Equipment used: None Transfers: Squat Pivot Transfers     Squat pivot transfers: Min guard     General transfer comment: pt performing squat pivot transfer to the right EOB>recliner with close minguard for safety; pt demonstrating safe hand placement throughout    Balance Overall balance assessment: Needs assistance Sitting-balance support: No upper extremity supported;Feet supported Sitting balance-Leahy Scale: Good       Standing balance-Leahy Scale: Poor Standing balance comment: reliant on UEs                          ADL either performed or assessed with clinical judgement   ADL Overall ADL's : Needs assistance/impaired     Grooming: Wash/dry face;Supervision/safety;Sitting;Set up   Upper Body Bathing: Modified independent;Sitting;Set up   Lower Body Bathing: Modified independent;Set up;Sitting/lateral leans         Lower Body Dressing Details (indicate cue type and reason): discussed techniques for LB dressing; pt reports she has mostly been wearing nightgowns and pullover dresses to decrease need for LB dressing Toilet Transfer: Squat-pivot;Min guard Toilet Transfer Details (indicate cue type and reason): discussed safety/techniques for completing squat pivot transfers to [facility] at home, pt demonstrating simulated through squat pivot to recliner during this session            General ADL Comments: pt transferred to recliner and setup for bathing ADLs; discussed safety/compensatory strategies at home with ADL and mobility completion      Vision       Perception     Praxis      Cognition Arousal/Alertness: Awake/alert Behavior During Therapy: WFL for tasks assessed/performed Overall Cognitive Status: Within Functional Limits for tasks assessed                                                            Pertinent Vitals/ Pain       Pain Assessment: 0-10 Pain Score: 6  Pain Descriptors / Indicators: Operative site guarding Pain Intervention(s): Monitored during session;Premedicated  before session;Ice applied  Home Living                                          Prior Functioning/Environment              Frequency  Min 2X/week        Progress Toward Goals  OT Goals(current goals can now be found in the care plan section)  Progress towards OT goals: Progressing toward goals  Acute Rehab OT Goals Patient Stated Goal: hopeful for home today  OT Goal Formulation: With patient Time For Goal Achievement: 07/09/18 Potential to  Achieve Goals: Good  Plan Discharge plan remains appropriate    Co-evaluation                 AM-PAC PT "6 Clicks" Daily Activity     Outcome Measure   Help from another person eating meals?: None Help from another person taking care of personal grooming?: None Help from another person toileting, which includes using toliet, bedpan, or urinal?: A Little Help from another person bathing (including washing, rinsing, drying)?: A Little Help from another person to put on and taking off regular upper body clothing?: None Help from another person to put on and taking off regular lower body clothing?: A Little 6 Click Score: 21    End of Session    OT Visit Diagnosis: Unsteadiness on feet (R26.81);Muscle weakness (generalized) (M62.81);Pain Pain - Right/Left: Right Pain - part of body: Ankle and joints of foot   Activity Tolerance Patient tolerated treatment well   Patient Left in chair;with call bell/phone within reach;with chair alarm set   Nurse Communication Mobility status        Time: 0370-4888 OT Time Calculation (min): 22 min  Charges: OT General Charges $OT Visit: 1 Visit OT Treatments $Self Care/Home Management : 8-22 mins  Marcy Siren, OT Supplemental Rehabilitation Services Pager [phone] Office [phone]     Orlando Penner 06/26/2018, 9:41 AM

## 2018-06-26 NOTE — Care Management Obs Status (Signed)
MEDICARE OBSERVATION STATUS NOTIFICATION   Patient Details  Name: Kristy [name] MRN: [id number] Date of Birth: June 14, 1941   Medicare Observation Status Notification Given:  Yes    Elliot Cousin, RN 06/26/2018, 11:42 AM

## 2018-06-26 NOTE — Anesthesia Postprocedure Evaluation (Signed)
Anesthesia Post Note  Patient: Kristy [name]  Procedure(s) Performed: OPEN REDUCTION INTERNAL FIXATION (ORIF) RIGHT ANKLE FRACTURE (Right Ankle)     Patient location during evaluation: PACU Anesthesia Type: General Level of consciousness: awake and alert Pain management: pain level controlled Vital Signs Assessment: post-procedure vital signs reviewed and stable Respiratory status: spontaneous breathing, nonlabored ventilation, respiratory function stable and patient connected to nasal cannula oxygen Cardiovascular status: blood pressure returned to baseline and stable Postop Assessment: no apparent nausea or vomiting Anesthetic complications: no    Last Vitals:  Vitals:   06/25/18 2048 06/26/18 0621  BP: (!) 176/84 (!) 171/94  Pulse: 69 81  Resp: 16 14  Temp: 36.6 C 36.6 C  SpO2: 96% 100%    Last Pain:  Vitals:   06/26/18 0621  TempSrc: Oral  PainSc:                  Jary Louvier S

## 2018-06-27 ENCOUNTER — Encounter (HOSPITAL_COMMUNITY): Payer: Self-pay | Admitting: Orthopaedic Surgery

## 2018-06-28 DIAGNOSIS — Z7951 Long term (current) use of inhaled steroids: Secondary | ICD-10-CM | POA: Diagnosis not present

## 2018-06-28 DIAGNOSIS — I10 Essential (primary) hypertension: Secondary | ICD-10-CM | POA: Diagnosis not present

## 2018-06-28 DIAGNOSIS — K219 Gastro-esophageal reflux disease without esophagitis: Secondary | ICD-10-CM | POA: Diagnosis not present

## 2018-06-28 DIAGNOSIS — J45909 Unspecified asthma, uncomplicated: Secondary | ICD-10-CM | POA: Diagnosis not present

## 2018-06-28 DIAGNOSIS — M1990 Unspecified osteoarthritis, unspecified site: Secondary | ICD-10-CM | POA: Diagnosis not present

## 2018-06-28 DIAGNOSIS — S82851D Displaced trimalleolar fracture of right lower leg, subsequent encounter for closed fracture with routine healing: Secondary | ICD-10-CM | POA: Diagnosis not present

## 2018-06-28 DIAGNOSIS — Z87891 Personal history of nicotine dependence: Secondary | ICD-10-CM | POA: Diagnosis not present

## 2018-06-28 NOTE — Discharge Summary (Signed)
Patient ID: Kristy [name] MRN: [id number] DOB/AGE: 1940/12/24 77 y.o.  Admit date: 06/24/2018 Discharge date: 06/28/2018  Admission Diagnoses:  Principal Problem:   Closed trimalleolar fracture of right ankle Active Problems:   Trimalleolar fracture of right ankle   Discharge Diagnoses:  Same  Past Medical History:  Diagnosis Date  . Allergic rhinitis, seasonal   . Arthritis   . Asthma   . GERD (gastroesophageal reflux disease)   . History of colon polyps   . Hypertension   . Rotator cuff tear, right     Surgeries: Procedure(s): OPEN REDUCTION INTERNAL FIXATION (ORIF) RIGHT ANKLE FRACTURE on 06/24/2018   Consultants:   Discharged Condition: Improved  Hospital Course: LAVIDA PATCH is an 77 y.o. female who was admitted 06/24/2018 for operative treatment ofClosed trimalleolar fracture of right ankle. Patient has severe unremitting pain that affects sleep, daily activities, and work/hobbies. After pre-op clearance the patient was taken to the operating room on 06/24/2018 and underwent  Procedure(s): OPEN REDUCTION INTERNAL FIXATION (ORIF) RIGHT ANKLE FRACTURE.    Patient was given perioperative antibiotics:  Anti-infectives (From admission, onward)   Start     Dose/Rate Route Frequency Ordered Stop   06/24/18 2000  ceFAZolin (ANCEF) IVPB 1 g/50 mL premix     1 g 100 mL/hr over 30 Minutes Intravenous Every 6 hours 06/24/18 1641 06/25/18 0915   06/24/18 1230  ceFAZolin (ANCEF) IVPB 2g/100 mL premix     2 g 200 mL/hr over 30 Minutes Intravenous On call to O.R. 06/24/18 1224 06/24/18 1437       Patient was given sequential compression devices, early ambulation, and chemoprophylaxis to prevent DVT.  Patient benefited maximally from hospital stay and there were no complications.    Recent vital signs: No data found.   Recent laboratory studies:  Recent Labs    06/26/18 0349  NA 141  K 3.3*  CL 102  CO2 26  BUN 13  CREATININE 0.62  GLUCOSE 110*  CALCIUM 9.0      Discharge Medications:   Allergies as of 06/26/2018      Reactions   Aspirin Itching   Clonidine Hives      Medication List    TAKE these medications   ADVAIR DISKUS 250-50 MCG/DOSE Aepb Generic drug:  Fluticasone-Salmeterol INHALE 1 PUFF INTO THE LUNGS EVERY 12 (TWELVE) HOURS FOR SHORTNESS OF BREATH AND WHEEZING What changed:  See the new instructions.   albuterol 108 (90 Base) MCG/ACT inhaler Commonly known as:  PROVENTIL HFA;VENTOLIN HFA Inhale 2 puffs into the lungs every 6 (six) hours as needed for wheezing.   benazepril 40 MG tablet Commonly known as:  LOTENSIN TAKE 1 TABLET (40 MG TOTAL) BY MOUTH DAILY.   BLUE-EMU MAXIMUM STRENGTH EX Apply 1 application topically daily.   celecoxib 200 MG capsule Commonly known as:  CELEBREX TAKE 1 CAPSULE BY MOUTH EVERY DAY   COLLAGEN PO Take by mouth. Collagen powder-mix with coffee and drink daily   fluticasone 50 MCG/ACT nasal spray Commonly known as:  FLONASE SPRAY 2 SPRAYS INTO EACH NOSTRIL EVERY DAY What changed:  See the new instructions.   hydrochlorothiazide 25 MG tablet Commonly known as:  HYDRODIURIL TAKE 1 TABLET BY MOUTH EVERY DAY   HYDROcodone-acetaminophen 5-325 MG tablet Commonly known as:  NORCO/VICODIN Take 1-2 tablets by mouth every 4 (four) hours as needed for moderate pain. Notes to patient:  Last dose given 06/26/18 at 9:10 am   Next dose available 06/26/18 at 1:10 pm   SALONPAS  PAIN RELIEF PATCH EX Apply 1 patch topically daily as needed (pain).   traMADol 50 MG tablet Commonly known as:  ULTRAM TAKE 1 TABLET BY MOUTH EVERY 6 HOURS       Diagnostic Studies: Dg Tibia/fibula Right  Result Date: 06/15/2018 CLINICAL DATA:  Right ankle pain after fall. EXAM: RIGHT TIBIA AND FIBULA - 2 VIEW COMPARISON:  None. FINDINGS: Acute, trimalleolar fracture-dislocation of the right ankle with anteriorly dislocated tibial plafond relative to the talar dome. Slight lateral displacement is also seen of the  foot relative to the tibial plafond. The knee joint is maintained. The remainder of the tibia and fibula appear intact. There is overlying soft tissue swelling. IMPRESSION: Acute trimalleolar fracture-dislocation of the right ankle with anteriorly dislocated tibial plafond relative to the talar dome and slight lateral displacement of foot also present. Electronically Signed   By: Tollie Eth M.D.   On: 06/15/2018 15:12   Dg Ankle Complete Right  Result Date: 06/15/2018 CLINICAL DATA:  Follow-up examination status post splinting and reduction. EXAM: RIGHT ANKLE - COMPLETE 3+ VIEW COMPARISON:  Prior radiograph from earlier same day. FINDINGS: Splinting material overlies right ankle, somewhat limiting evaluation for fine osseous detail. Previously identified tri malleolar fracture of the right ankle again seen. Tibiotalar joint now in anatomic alignment. Persistent 3 mm lateral displacement at the distal fibula. Mild persistent displacement/distraction at the posterior and medial malleolar fractures. Diffuse soft tissue swelling about the ankle. IMPRESSION: Interval splinting and reduction about the acute right ankle trimalleolar fracture as above. Tibial talar joint in normal anatomic alignment. Electronically Signed   By: Rise Mu M.D.   On: 06/15/2018 17:24   Dg Ankle Complete Right  Result Date: 06/15/2018 CLINICAL DATA:  Diffuse right ankle pain after fall. EXAM: RIGHT ANKLE - COMPLETE 3+ VIEW COMPARISON:  None. FINDINGS: Acute, trimalleolar fracture-dislocation of the right ankle is noted with anteriorly dislocation of the tibial plafond relative to the talar dome. An oblique fracture of the distal fibular metadiaphysis and transverse fracture of the medial malleolus is noted. A coronal fracture of the posterior malleolus is identified. The midfoot and subtalar joints are intact. No fracture of the more proximal tibia nor fibula. Overlying soft tissue swelling is seen about the ankle.  IMPRESSION: Acute, trimalleolar fracture-dislocation of the right ankle with anterior displaced tibial plafond relative to the talar dome. Electronically Signed   By: Tollie Eth M.D.   On: 06/15/2018 15:09   Dg C-arm 1-60 Min-no Report  Result Date: 06/24/2018 Fluoroscopy was utilized by the requesting physician.  No radiographic interpretation.    Disposition:     Follow-up Information    Kathryne Hitch, MD. Schedule an appointment as soon as possible for a visit in 2 week(s).   Specialty:  Orthopedic Surgery Contact information: [address] [place] [address] [phone]        [facility] - Dme Follow up.   Why:  will deliver wheelchair to room prior to discharge Contact information: [address] [place] [address] [phone]        [facility]-Home Follow up.   Specialty:  Home Health Services Why:  Home Health Physical Therapy-agency will call to arrange initial appointment Contact information: [address] [place] [address] [phone]            Signed: Richardean Canal 06/28/2018, 11:22 AM

## 2018-06-30 ENCOUNTER — Telehealth (INDEPENDENT_AMBULATORY_CARE_PROVIDER_SITE_OTHER): Payer: Self-pay | Admitting: Orthopaedic Surgery

## 2018-06-30 NOTE — Telephone Encounter (Signed)
Tasia Catchingsraig, [facility] requesting verbal orders for physical therapy to work on balance and transfers 2 x for 4 weeks  CB # [phone]

## 2018-06-30 NOTE — Telephone Encounter (Signed)
Verbal order left on VM  

## 2018-07-01 DIAGNOSIS — I10 Essential (primary) hypertension: Secondary | ICD-10-CM | POA: Diagnosis not present

## 2018-07-01 DIAGNOSIS — J45909 Unspecified asthma, uncomplicated: Secondary | ICD-10-CM | POA: Diagnosis not present

## 2018-07-01 DIAGNOSIS — S82851D Displaced trimalleolar fracture of right lower leg, subsequent encounter for closed fracture with routine healing: Secondary | ICD-10-CM | POA: Diagnosis not present

## 2018-07-01 DIAGNOSIS — M1990 Unspecified osteoarthritis, unspecified site: Secondary | ICD-10-CM | POA: Diagnosis not present

## 2018-07-01 DIAGNOSIS — Z7951 Long term (current) use of inhaled steroids: Secondary | ICD-10-CM | POA: Diagnosis not present

## 2018-07-01 DIAGNOSIS — K219 Gastro-esophageal reflux disease without esophagitis: Secondary | ICD-10-CM | POA: Diagnosis not present

## 2018-07-04 DIAGNOSIS — K219 Gastro-esophageal reflux disease without esophagitis: Secondary | ICD-10-CM | POA: Diagnosis not present

## 2018-07-04 DIAGNOSIS — J45909 Unspecified asthma, uncomplicated: Secondary | ICD-10-CM | POA: Diagnosis not present

## 2018-07-04 DIAGNOSIS — I10 Essential (primary) hypertension: Secondary | ICD-10-CM | POA: Diagnosis not present

## 2018-07-04 DIAGNOSIS — Z7951 Long term (current) use of inhaled steroids: Secondary | ICD-10-CM | POA: Diagnosis not present

## 2018-07-04 DIAGNOSIS — S82851D Displaced trimalleolar fracture of right lower leg, subsequent encounter for closed fracture with routine healing: Secondary | ICD-10-CM | POA: Diagnosis not present

## 2018-07-04 DIAGNOSIS — M1990 Unspecified osteoarthritis, unspecified site: Secondary | ICD-10-CM | POA: Diagnosis not present

## 2018-07-07 ENCOUNTER — Ambulatory Visit (INDEPENDENT_AMBULATORY_CARE_PROVIDER_SITE_OTHER): Payer: Medicare Other

## 2018-07-07 ENCOUNTER — Ambulatory Visit: Payer: Medicare Other | Admitting: Internal Medicine

## 2018-07-07 ENCOUNTER — Ambulatory Visit (INDEPENDENT_AMBULATORY_CARE_PROVIDER_SITE_OTHER): Payer: Medicare Other | Admitting: Orthopaedic Surgery

## 2018-07-07 ENCOUNTER — Encounter (INDEPENDENT_AMBULATORY_CARE_PROVIDER_SITE_OTHER): Payer: Self-pay | Admitting: Orthopaedic Surgery

## 2018-07-07 DIAGNOSIS — S82851D Displaced trimalleolar fracture of right lower leg, subsequent encounter for closed fracture with routine healing: Secondary | ICD-10-CM

## 2018-07-07 NOTE — Progress Notes (Signed)
The patient is 2 weeks status post open reduction and fixation of a complex displaced bimalleolar ankle fracture of the right ankle.  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  She has been compliant with nonweightbearing in the right cam walking boot.  She has had a fall recently injuring her back.  She did have to put weight on her ankle after that fall.  On exam her swelling is only minimal to moderate of her right ankle which is to be expected but she actually has good motion.  Her incisions medial lateral ligaments remove the sutures and placed Steri-Strips.  Her foot is well-perfused.  3 views of her right ankle show intact hardware and intact ankle mortise.  This point will letter weight-bear as tolerating it in a cam walking boot.  She is going go slow and listen to her body in terms of backing off if the pain is bad enough.  We will see her back in 4 weeks to see how she is doing overall with repeat 3 views of her right ankle.  All question concerns were answered and addressed.

## 2018-07-08 DIAGNOSIS — Z7951 Long term (current) use of inhaled steroids: Secondary | ICD-10-CM | POA: Diagnosis not present

## 2018-07-08 DIAGNOSIS — K219 Gastro-esophageal reflux disease without esophagitis: Secondary | ICD-10-CM | POA: Diagnosis not present

## 2018-07-08 DIAGNOSIS — M1990 Unspecified osteoarthritis, unspecified site: Secondary | ICD-10-CM | POA: Diagnosis not present

## 2018-07-08 DIAGNOSIS — S82851D Displaced trimalleolar fracture of right lower leg, subsequent encounter for closed fracture with routine healing: Secondary | ICD-10-CM | POA: Diagnosis not present

## 2018-07-08 DIAGNOSIS — J45909 Unspecified asthma, uncomplicated: Secondary | ICD-10-CM | POA: Diagnosis not present

## 2018-07-08 DIAGNOSIS — I10 Essential (primary) hypertension: Secondary | ICD-10-CM | POA: Diagnosis not present

## 2018-07-11 DIAGNOSIS — S82851D Displaced trimalleolar fracture of right lower leg, subsequent encounter for closed fracture with routine healing: Secondary | ICD-10-CM | POA: Diagnosis not present

## 2018-07-11 DIAGNOSIS — Z7951 Long term (current) use of inhaled steroids: Secondary | ICD-10-CM | POA: Diagnosis not present

## 2018-07-11 DIAGNOSIS — K219 Gastro-esophageal reflux disease without esophagitis: Secondary | ICD-10-CM | POA: Diagnosis not present

## 2018-07-11 DIAGNOSIS — I10 Essential (primary) hypertension: Secondary | ICD-10-CM | POA: Diagnosis not present

## 2018-07-11 DIAGNOSIS — M1990 Unspecified osteoarthritis, unspecified site: Secondary | ICD-10-CM | POA: Diagnosis not present

## 2018-07-11 DIAGNOSIS — J45909 Unspecified asthma, uncomplicated: Secondary | ICD-10-CM | POA: Diagnosis not present

## 2018-07-14 DIAGNOSIS — K219 Gastro-esophageal reflux disease without esophagitis: Secondary | ICD-10-CM | POA: Diagnosis not present

## 2018-07-14 DIAGNOSIS — S82851D Displaced trimalleolar fracture of right lower leg, subsequent encounter for closed fracture with routine healing: Secondary | ICD-10-CM | POA: Diagnosis not present

## 2018-07-14 DIAGNOSIS — M1990 Unspecified osteoarthritis, unspecified site: Secondary | ICD-10-CM | POA: Diagnosis not present

## 2018-07-14 DIAGNOSIS — Z7951 Long term (current) use of inhaled steroids: Secondary | ICD-10-CM | POA: Diagnosis not present

## 2018-07-14 DIAGNOSIS — I10 Essential (primary) hypertension: Secondary | ICD-10-CM | POA: Diagnosis not present

## 2018-07-14 DIAGNOSIS — J45909 Unspecified asthma, uncomplicated: Secondary | ICD-10-CM | POA: Diagnosis not present

## 2018-07-18 DIAGNOSIS — K219 Gastro-esophageal reflux disease without esophagitis: Secondary | ICD-10-CM | POA: Diagnosis not present

## 2018-07-18 DIAGNOSIS — I10 Essential (primary) hypertension: Secondary | ICD-10-CM | POA: Diagnosis not present

## 2018-07-18 DIAGNOSIS — Z7951 Long term (current) use of inhaled steroids: Secondary | ICD-10-CM | POA: Diagnosis not present

## 2018-07-18 DIAGNOSIS — S82851D Displaced trimalleolar fracture of right lower leg, subsequent encounter for closed fracture with routine healing: Secondary | ICD-10-CM | POA: Diagnosis not present

## 2018-07-18 DIAGNOSIS — M1990 Unspecified osteoarthritis, unspecified site: Secondary | ICD-10-CM | POA: Diagnosis not present

## 2018-07-18 DIAGNOSIS — J45909 Unspecified asthma, uncomplicated: Secondary | ICD-10-CM | POA: Diagnosis not present

## 2018-07-21 DIAGNOSIS — Z7951 Long term (current) use of inhaled steroids: Secondary | ICD-10-CM | POA: Diagnosis not present

## 2018-07-21 DIAGNOSIS — S82851D Displaced trimalleolar fracture of right lower leg, subsequent encounter for closed fracture with routine healing: Secondary | ICD-10-CM | POA: Diagnosis not present

## 2018-07-21 DIAGNOSIS — J45909 Unspecified asthma, uncomplicated: Secondary | ICD-10-CM | POA: Diagnosis not present

## 2018-07-21 DIAGNOSIS — K219 Gastro-esophageal reflux disease without esophagitis: Secondary | ICD-10-CM | POA: Diagnosis not present

## 2018-07-21 DIAGNOSIS — M1990 Unspecified osteoarthritis, unspecified site: Secondary | ICD-10-CM | POA: Diagnosis not present

## 2018-07-21 DIAGNOSIS — I10 Essential (primary) hypertension: Secondary | ICD-10-CM | POA: Diagnosis not present

## 2018-07-25 ENCOUNTER — Telehealth (INDEPENDENT_AMBULATORY_CARE_PROVIDER_SITE_OTHER): Payer: Self-pay | Admitting: Orthopaedic Surgery

## 2018-07-25 NOTE — Telephone Encounter (Signed)
Kristy [name], with [facility], called to request VO for continuing PT  1x a week for 2 weeks until she sees Dr. Magnus Ivan and if she can bare weight at that time he would like to do   2-3x a week 2 weeks.  CB#[phone].

## 2018-07-25 NOTE — Telephone Encounter (Signed)
Verbal order given to Craig 

## 2018-07-27 ENCOUNTER — Other Ambulatory Visit: Payer: Self-pay | Admitting: Internal Medicine

## 2018-07-27 DIAGNOSIS — J45909 Unspecified asthma, uncomplicated: Secondary | ICD-10-CM | POA: Diagnosis not present

## 2018-07-27 DIAGNOSIS — S82851D Displaced trimalleolar fracture of right lower leg, subsequent encounter for closed fracture with routine healing: Secondary | ICD-10-CM | POA: Diagnosis not present

## 2018-07-27 DIAGNOSIS — K219 Gastro-esophageal reflux disease without esophagitis: Secondary | ICD-10-CM | POA: Diagnosis not present

## 2018-07-27 DIAGNOSIS — Z7951 Long term (current) use of inhaled steroids: Secondary | ICD-10-CM | POA: Diagnosis not present

## 2018-07-27 DIAGNOSIS — I10 Essential (primary) hypertension: Secondary | ICD-10-CM | POA: Diagnosis not present

## 2018-07-27 DIAGNOSIS — M1990 Unspecified osteoarthritis, unspecified site: Secondary | ICD-10-CM | POA: Diagnosis not present

## 2018-07-28 ENCOUNTER — Other Ambulatory Visit: Payer: Self-pay | Admitting: Internal Medicine

## 2018-08-01 ENCOUNTER — Telehealth (INDEPENDENT_AMBULATORY_CARE_PROVIDER_SITE_OTHER): Payer: Self-pay | Admitting: Orthopaedic Surgery

## 2018-08-01 NOTE — Telephone Encounter (Signed)
Jera with [facility] left a message stating that she sent over some paperwork for Dr. Ophelia Charter to sign and date and has not received the paperwork back as of yet.  XB#[phone] [id number].  Thank you.

## 2018-08-01 NOTE — Telephone Encounter (Signed)
It looks like this is a Dr. Magnus Ivan patient.

## 2018-08-02 NOTE — Telephone Encounter (Signed)
Yes, we have something.

## 2018-08-02 NOTE — Telephone Encounter (Signed)
You don't remember seeing this do you?

## 2018-08-04 ENCOUNTER — Ambulatory Visit (INDEPENDENT_AMBULATORY_CARE_PROVIDER_SITE_OTHER): Payer: Medicare Other | Admitting: Physician Assistant

## 2018-08-04 ENCOUNTER — Encounter (INDEPENDENT_AMBULATORY_CARE_PROVIDER_SITE_OTHER): Payer: Self-pay | Admitting: Physician Assistant

## 2018-08-04 ENCOUNTER — Ambulatory Visit (INDEPENDENT_AMBULATORY_CARE_PROVIDER_SITE_OTHER): Payer: Medicare Other

## 2018-08-04 DIAGNOSIS — M25571 Pain in right ankle and joints of right foot: Secondary | ICD-10-CM

## 2018-08-04 DIAGNOSIS — S82851D Displaced trimalleolar fracture of right lower leg, subsequent encounter for closed fracture with routine healing: Secondary | ICD-10-CM

## 2018-08-04 NOTE — Progress Notes (Signed)
HPI: [name] returns today now 5 weeks 5 days status post right ankle ORIF of a bimalleolar ankle fracture.  She states she still having pain and swelling ankle.  She is really did not been putting weight on the right foot and ankle she is in a cam walker boot.  She still using crutches.  She denies any fevers chills shortness of breath chest pain.  Physical exam: Right ankle surgical incisions are healing well no signs of infection calf supple nontender.  She is able to dorsiflex plantarflex the ankle.  She has tenderness along the lateral and medial malleoli.  Radiographs: 3 views right ankle shows a talus well located within the ankle mortise no signs of diastases.  Medial lateral hardware is without any signs of failure.  Fractures were reduced anatomically with good further consolidation.  No other fractures identified.  Impression: 5 weeks 5 days status post open reduction internal fixation right ankle bimalleolar ankle fracture.  Plan: We will place her back in the cam walker boot weightbearing as tolerated for a week then she will go into an ASO brace which she is given today weightbearing as tolerated.  Week of October 28 she will begin physical therapy outpatient to work on range of motion strengthening of the right ankle..  Questions were encouraged and answered at length.  We will see her back in 1 month sooner if there is any questions or concerns AP and lateral views of the right ankle at that  time.                                                                                                                                                                                                                                                                                      ++                                                                                                                                                                                                                                                                                                                                                                                                                                                                                        ++++++++  A+++ +

## 2018-08-05 ENCOUNTER — Telehealth (INDEPENDENT_AMBULATORY_CARE_PROVIDER_SITE_OTHER): Payer: Self-pay | Admitting: Orthopaedic Surgery

## 2018-08-05 ENCOUNTER — Other Ambulatory Visit: Payer: Self-pay | Admitting: Internal Medicine

## 2018-08-05 ENCOUNTER — Other Ambulatory Visit (INDEPENDENT_AMBULATORY_CARE_PROVIDER_SITE_OTHER): Payer: Self-pay

## 2018-08-05 DIAGNOSIS — S82851D Displaced trimalleolar fracture of right lower leg, subsequent encounter for closed fracture with routine healing: Secondary | ICD-10-CM

## 2018-08-05 DIAGNOSIS — Z7951 Long term (current) use of inhaled steroids: Secondary | ICD-10-CM | POA: Diagnosis not present

## 2018-08-05 DIAGNOSIS — K219 Gastro-esophageal reflux disease without esophagitis: Secondary | ICD-10-CM | POA: Diagnosis not present

## 2018-08-05 DIAGNOSIS — I10 Essential (primary) hypertension: Secondary | ICD-10-CM | POA: Diagnosis not present

## 2018-08-05 DIAGNOSIS — J45909 Unspecified asthma, uncomplicated: Secondary | ICD-10-CM | POA: Diagnosis not present

## 2018-08-05 DIAGNOSIS — M1990 Unspecified osteoarthritis, unspecified site: Secondary | ICD-10-CM | POA: Diagnosis not present

## 2018-08-05 NOTE — Telephone Encounter (Signed)
noted 

## 2018-08-05 NOTE — Telephone Encounter (Signed)
Dr. Magnus Ivan patient. I called [facility] and advised, per last office note from Rexene Edison, PA-C, patient will be WBAT in CAM boot x one week and then WBAT in ASO.  Patient to begin outpatient PT the week of 08/15/18.

## 2018-08-05 NOTE — Telephone Encounter (Signed)
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 She was sent to triage seeking help. Please excuse previous note w/Yates as provider. I think PT has wring MD.

## 2018-08-05 NOTE — Telephone Encounter (Signed)
[facility]/PT/[facility]  Called to get weight bearing status for patient. Patient didn't have discharge papers.  Please call [facility] to advise [phone]

## 2018-08-09 DIAGNOSIS — M1990 Unspecified osteoarthritis, unspecified site: Secondary | ICD-10-CM | POA: Diagnosis not present

## 2018-08-09 DIAGNOSIS — J45909 Unspecified asthma, uncomplicated: Secondary | ICD-10-CM | POA: Diagnosis not present

## 2018-08-09 DIAGNOSIS — Z7951 Long term (current) use of inhaled steroids: Secondary | ICD-10-CM | POA: Diagnosis not present

## 2018-08-09 DIAGNOSIS — S82851D Displaced trimalleolar fracture of right lower leg, subsequent encounter for closed fracture with routine healing: Secondary | ICD-10-CM | POA: Diagnosis not present

## 2018-08-09 DIAGNOSIS — I10 Essential (primary) hypertension: Secondary | ICD-10-CM | POA: Diagnosis not present

## 2018-08-09 DIAGNOSIS — K219 Gastro-esophageal reflux disease without esophagitis: Secondary | ICD-10-CM | POA: Diagnosis not present

## 2018-08-10 DIAGNOSIS — J45909 Unspecified asthma, uncomplicated: Secondary | ICD-10-CM | POA: Diagnosis not present

## 2018-08-10 DIAGNOSIS — M1990 Unspecified osteoarthritis, unspecified site: Secondary | ICD-10-CM | POA: Diagnosis not present

## 2018-08-10 DIAGNOSIS — S82851D Displaced trimalleolar fracture of right lower leg, subsequent encounter for closed fracture with routine healing: Secondary | ICD-10-CM | POA: Diagnosis not present

## 2018-08-10 DIAGNOSIS — K219 Gastro-esophageal reflux disease without esophagitis: Secondary | ICD-10-CM | POA: Diagnosis not present

## 2018-08-10 DIAGNOSIS — I10 Essential (primary) hypertension: Secondary | ICD-10-CM | POA: Diagnosis not present

## 2018-08-10 DIAGNOSIS — Z7951 Long term (current) use of inhaled steroids: Secondary | ICD-10-CM | POA: Diagnosis not present

## 2018-08-12 DIAGNOSIS — S82851D Displaced trimalleolar fracture of right lower leg, subsequent encounter for closed fracture with routine healing: Secondary | ICD-10-CM | POA: Diagnosis not present

## 2018-08-12 DIAGNOSIS — K219 Gastro-esophageal reflux disease without esophagitis: Secondary | ICD-10-CM | POA: Diagnosis not present

## 2018-08-12 DIAGNOSIS — Z7951 Long term (current) use of inhaled steroids: Secondary | ICD-10-CM | POA: Diagnosis not present

## 2018-08-12 DIAGNOSIS — I10 Essential (primary) hypertension: Secondary | ICD-10-CM | POA: Diagnosis not present

## 2018-08-12 DIAGNOSIS — M1990 Unspecified osteoarthritis, unspecified site: Secondary | ICD-10-CM | POA: Diagnosis not present

## 2018-08-12 DIAGNOSIS — J45909 Unspecified asthma, uncomplicated: Secondary | ICD-10-CM | POA: Diagnosis not present

## 2018-08-13 ENCOUNTER — Other Ambulatory Visit: Payer: Self-pay | Admitting: Internal Medicine

## 2018-08-15 DIAGNOSIS — Z7951 Long term (current) use of inhaled steroids: Secondary | ICD-10-CM | POA: Diagnosis not present

## 2018-08-15 DIAGNOSIS — I10 Essential (primary) hypertension: Secondary | ICD-10-CM | POA: Diagnosis not present

## 2018-08-15 DIAGNOSIS — M1990 Unspecified osteoarthritis, unspecified site: Secondary | ICD-10-CM | POA: Diagnosis not present

## 2018-08-15 DIAGNOSIS — J45909 Unspecified asthma, uncomplicated: Secondary | ICD-10-CM | POA: Diagnosis not present

## 2018-08-15 DIAGNOSIS — K219 Gastro-esophageal reflux disease without esophagitis: Secondary | ICD-10-CM | POA: Diagnosis not present

## 2018-08-15 DIAGNOSIS — S82851D Displaced trimalleolar fracture of right lower leg, subsequent encounter for closed fracture with routine healing: Secondary | ICD-10-CM | POA: Diagnosis not present

## 2018-08-16 DIAGNOSIS — J45909 Unspecified asthma, uncomplicated: Secondary | ICD-10-CM | POA: Diagnosis not present

## 2018-08-16 DIAGNOSIS — I10 Essential (primary) hypertension: Secondary | ICD-10-CM | POA: Diagnosis not present

## 2018-08-16 DIAGNOSIS — Z7951 Long term (current) use of inhaled steroids: Secondary | ICD-10-CM | POA: Diagnosis not present

## 2018-08-16 DIAGNOSIS — M1990 Unspecified osteoarthritis, unspecified site: Secondary | ICD-10-CM | POA: Diagnosis not present

## 2018-08-16 DIAGNOSIS — S82851D Displaced trimalleolar fracture of right lower leg, subsequent encounter for closed fracture with routine healing: Secondary | ICD-10-CM | POA: Diagnosis not present

## 2018-08-16 DIAGNOSIS — K219 Gastro-esophageal reflux disease without esophagitis: Secondary | ICD-10-CM | POA: Diagnosis not present

## 2018-08-17 ENCOUNTER — Other Ambulatory Visit: Payer: Self-pay

## 2018-08-17 ENCOUNTER — Ambulatory Visit: Payer: Medicare Other | Attending: Orthopaedic Surgery | Admitting: Physical Therapy

## 2018-08-17 ENCOUNTER — Encounter: Payer: Self-pay | Admitting: Physical Therapy

## 2018-08-17 DIAGNOSIS — R262 Difficulty in walking, not elsewhere classified: Secondary | ICD-10-CM

## 2018-08-17 DIAGNOSIS — M6281 Muscle weakness (generalized): Secondary | ICD-10-CM | POA: Insufficient documentation

## 2018-08-17 DIAGNOSIS — M25571 Pain in right ankle and joints of right foot: Secondary | ICD-10-CM | POA: Insufficient documentation

## 2018-08-17 DIAGNOSIS — M25671 Stiffness of right ankle, not elsewhere classified: Secondary | ICD-10-CM | POA: Insufficient documentation

## 2018-08-17 NOTE — Therapy (Signed)
[facility] Outpatient Rehabilitation [facility] [address]  [address] [place], [place], [address] Phone: [phone]   Fax:  [phone]  Physical Therapy Evaluation  Patient Details  Name: Kristy [name] MRN: [id number] Date of Birth: March 25, 1941 Referring Provider (PT): Doneen Poisson, MD   Encounter Date: 08/17/2018  PT End of Session - 08/17/18 1112    Visit Number  1    Number of Visits  13    Date for PT Re-Evaluation  09/28/18    Authorization Type  Medicare and BCBS    PT Start Time  1017    PT Stop Time  1105    PT Time Calculation (min)  48 min    Activity Tolerance  Patient tolerated treatment well;Patient limited by pain    Behavior During Therapy  [facility] for tasks assessed/performed       Past Medical History:  Diagnosis Date  . Allergic rhinitis, seasonal   . Arthritis   . Asthma   . GERD (gastroesophageal reflux disease)   . History of colon polyps   . Hypertension   . Rotator cuff tear, right     Past Surgical History:  Procedure Laterality Date  . CHOLECYSTECTOMY OPEN  AGE 58  . INGUINAL HERNIA REPAIR Right AGE 58s  . KNEE ARTHROSCOPY Left 2014  . LUMBAR DISC SURGERY  x2  LAST DATE EARLY 2000s  . ORIF ANKLE FRACTURE Right 06/24/2018   Procedure: OPEN REDUCTION INTERNAL FIXATION (ORIF) RIGHT ANKLE FRACTURE;  Surgeon: Kathryne Hitch, MD;  Location: [facility];  Service: Orthopedics;  Laterality: Right;  . ROTATOR CUFF REPAIR Left 01/2015  . TONSILLECTOMY  AGE 42  . VAGINAL HYSTERECTOMY  AGE 11   WITH BSO    There were no vitals filed for this visit.   Subjective Assessment - 08/17/18 1019    Subjective  Patient reports she had a fall where she missed a step and broke her R ankle in 3 places. Had surgery on 06/24/18 for ORIF of right trimalleolar ankle fracture dislocation with fixation of the lateral and medial malleolus only. Was made to stay off foot for 6 weeks. Had [facility] PT for about 2 months after surgery. Was cleared to  start putting weight on R foot for the past 2 weeks with ASO brace. Patient now ambulating with ASO brace and no AD, however using walker or cane as needed when she gets tired. Would like to be able to walk and stand without pain. Patient with very active PLOF- bowling, dancing, walking 4 miles a day. Reports still having significant pain- mostly over medial and lateral incision sites. Also still having some swelling in R ankle and has been icing it.      Pertinent History  R RTC tear, HTN, GERD, asthma, L RTC repair, lumbar disc surgery    Limitations  Lifting;Standing;Walking;House hold activities    How long can you sit comfortably?  unlimited    How long can you stand comfortably?  5-10 min    How long can you walk comfortably?  15 min    Diagnostic tests  08/04/18 R ankle xray: 3 views right ankle shows a talus well located within the ankle mortise no signs of diastases. Medial lateral hardware is without any signs of failure. Fractures were reduced anatomically with good  further consolidation. No other fractures identified    Patient Stated Goals  "get back to my life"    Currently in Pain?  Yes    Pain  Score  5     Pain Location  Ankle    Pain Orientation  Right;Medial;Lateral    Pain Descriptors / Indicators  Dull;Throbbing    Pain Type  Acute pain;Surgical pain    Aggravating Factors   prolonged standing and walking    Pain Relieving Factors  ice, meds         [facility] PT Assessment - 08/17/18 1037      Assessment   Medical Diagnosis  R closed trimalleolar fracture of R ankle    Referring Provider (PT)  Doneen Poisson, MD    Onset Date/Surgical Date  06/24/18    Next MD Visit  09/01/18    Prior Therapy  Yes- [facility] PT       Precautions   Precautions  --   asthma     Restrictions   Weight Bearing Restrictions  --   R ankle WBAT     Balance Screen   Has the patient fallen in the past 6 months  Yes    How many times?  3   injury onset and 2 falls when learning to use  WC   Has the patient had a decrease in activity level because of a fear of falling?   No    Is the patient reluctant to leave their home because of a fear of falling?   No      Home Nurse, mental health  Private residence    Living Arrangements  Spouse/significant other    Available Help at Discharge  Family    Type of Home  House    Home Access  Stairs to enter    Entrance Stairs-Number of Steps  2    Entrance Stairs-Rails  Right;Left    Home Layout  Two level    Alternate Level Stairs-Number of Steps  16    Alternate Level Stairs-Rails  Right    Home Equipment  [place] - single point;Walker - 2 wheels      Prior Function   Level of Independence  Independent    Vocation  Retired    Leisure  bowling, dancing, walking      Cognition   Overall Cognitive Status  Within Functional Limits for tasks assessed      Observation/Other Assessments   Observations  severe edema to lateral ankle near incsion    Focus on Therapeutic Outcomes (FOTO)   Ankle: 51 (49% limited, 38% predicted)      Sensation   Light Touch  Appears Intact      Coordination   Gross Motor Movements are Fluid and Coordinated  Yes      Posture/Postural Control   Posture/Postural Control  Postural limitations    Postural Limitations  Forward head      ROM / Strength   AROM / PROM / Strength  AROM;PROM;Strength      AROM   AROM Assessment Site  Ankle    Right/Left Ankle  Left;Right    Right Ankle Dorsiflexion  90    Right Ankle Plantar Flexion  38    Right Ankle Inversion  24    Right Ankle Eversion  5    Left Ankle Dorsiflexion  10    Left Ankle Plantar Flexion  42    Left Ankle Inversion  30    Left Ankle Eversion  5      PROM   PROM Assessment Site  Ankle    Right/Left Ankle  Left;Right    Right Ankle Dorsiflexion  90   pain in M & L ankle   Right Ankle Plantar Flexion  40    Right Ankle Inversion  27   pain in medial ankle   Right Ankle Eversion  7    Left Ankle Dorsiflexion  15     Left Ankle Plantar Flexion  40    Left Ankle Inversion  35    Left Ankle Eversion  5      Strength   Strength Assessment Site  Hip;Knee;Ankle    Right/Left Hip  Right;Left    Right Hip Flexion  4/5    Right Hip ABduction  4/5    Right Hip ADduction  4/5    Left Hip Flexion  4+/5    Left Hip ABduction  4+/5    Left Hip ADduction  4+/5    Right/Left Knee  Right;Left    Right Knee Flexion  4+/5    Right Knee Extension  4+/5    Left Knee Flexion  4/5    Left Knee Extension  4+/5    Right/Left Ankle  Right;Left   c/o tightness with R ankle motion   Right Ankle Dorsiflexion  4/5    Right Ankle Plantar Flexion  4/5    Right Ankle Inversion  4/5    Right Ankle Eversion  4/5    Left Ankle Dorsiflexion  4+/5    Left Ankle Plantar Flexion  4+/5    Left Ankle Inversion  4/5    Left Ankle Eversion  4+/5      Palpation   Palpation comment  TTP in R medial and lateral ankle incision      Ambulation/Gait   Assistive device  None    Gait Pattern  Step-through pattern;Decreased step length - left;Decreased stance time - right;Decreased dorsiflexion - right;Lateral trunk lean to right;Poor foot clearance - right    Ambulation Surface  Level;Indoor    Gait velocity  decreased                Objective measurements completed on examination: See above findings.              PT Education - 08/17/18 1112    Education Details  prognosis, POC, HEP    Person(s) Educated  Patient    Methods  Explanation;Demonstration;Tactile cues;Verbal cues;Handout    Comprehension  Verbalized understanding;Returned demonstration       PT Short Term Goals - 08/17/18 1126      PT SHORT TERM GOAL #1   Title  Patient to be independent with initial HEP.    Time  3    Period  Weeks    Status  New    Target Date  09/07/18        PT Long Term Goals - 08/17/18 1127      PT LONG TERM GOAL #1   Title  Patient to be independent with advanced HEP.    Time  6    Period  Weeks    Status   New    Target Date  09/28/18      PT LONG TERM GOAL #2   Title  Patient to demonstrate [facility] and pain-free R ankle AROM/PROM.    Time  6    Period  Weeks    Status  New    Target Date  09/28/18      PT LONG TERM GOAL #3   Title  Patient to demonstrate >=4+/5 strength in B LEs.     Time  6    Period  Weeks    Status  New    Target Date  09/28/18      PT LONG TERM GOAL #4   Title  Patient to demonstrate R SLS for 10 sec without LOB.    Time  6    Period  Weeks    Status  New    Target Date  09/28/18      PT LONG TERM GOAL #5   Title  Patient to report tolerance of 1 hour of walking without pain limiting.     Time  6    Period  Weeks    Status  New    Target Date  09/28/18      Additional Long Term Goals   Additional Long Term Goals  Yes      PT LONG TERM GOAL #6   Title  Patient to return to bowling without pain limiting.     Time  6    Period  Weeks    Status  New    Target Date  09/28/18             Plan - 08/17/18 1113    Clinical Impression Statement  Patient is a 77y/o F presenting to OPPT with c/o R ankle pain after ORIF of right trimalleolar ankle fracture dislocation with fixation of the lateral and medial malleolus on 06/24/18. Patient has been ambulating WBAT with ASO brace for the past 2 weeks after being cleared by MD- using SPC and 2WW as needed d/t fatigue however seen today without AD. Reports still having significant pain over medial and lateral incision sites over malleoli. Also still having some swelling which she has been taking care of with ice. Patient is extremely active- bowling, dancing, walking 4 miles a day and would like to return to PLOF. Patient today with severe swelling over R lateral malleolus, tenderness over R medial and lateral malleoli, painful and limited R ankle ROM, decreased strength, and gait deviations. Educated on and received gentle stretching and strengthening HEP handout. Patient reported understanding. Would benefit from  skilled PTR services 2x/week for 6 weeks to address aforementioned impairments.     Clinical Presentation  Stable    Clinical Decision Making  Low    Rehab Potential  Good    Clinical Impairments Affecting Rehab Potential  R RTC tear, HTN, GERD, asthma, L RTC repair, lumbar disc surgery    PT Frequency  2x / week    PT Duration  6 weeks    PT Treatment/Interventions  ADLs/Self Care Home Management;Cryotherapy;Electrical Stimulation;Moist Heat;Ultrasound;DME Instruction;Gait training;Stair training;Functional mobility training;Therapeutic activities;Therapeutic exercise;Manual techniques;Orthotic Fit/Training;Patient/family education;Neuromuscular re-education;Balance training;Scar mobilization;Passive range of motion;Dry needling;Energy conservation;Splinting;Taping;Vasopneumatic Device    PT Next Visit Plan  reassess HEP    Consulted and Agree with Plan of Care  Patient       Patient will benefit from skilled therapeutic intervention in order to improve the following deficits and impairments:  Decreased endurance, Hypomobility, Increased edema, Decreased scar mobility, Decreased activity tolerance, Decreased strength, Pain, Difficulty walking, Decreased mobility, Decreased balance, Decreased range of motion, Impaired flexibility, Improper body mechanics  Visit Diagnosis: Pain in right ankle and joints of right foot  Stiffness of right ankle, not elsewhere classified  Difficulty in walking, not elsewhere classified  Muscle weakness (generalized)     Problem List Patient Active Problem List   Diagnosis Date Noted  . Trimalleolar fracture of right ankle 06/24/2018  . Closed trimalleolar fracture of right ankle 06/21/2018  .  Osteoarthritis 09/22/2012  . UNSPECIFIED DISORDER TEETH&SUPPORTING STRUCTURES 09/09/2010  . Essential hypertension 04/07/2007  . Allergic rhinitis 04/07/2007  . Asthma 04/07/2007  . GERD 04/07/2007  . COLONIC POLYPS, HX OF 04/07/2007    Anette Guarneri,  PT, DPT 08/17/18 11:30 AM   [facility] [address]  [address] [place], [place], [address] Phone: [phone]   Fax:  [phone]  Name: Kristy [name] MRN: [id number] Date of Birth: 1941-09-28

## 2018-08-22 ENCOUNTER — Ambulatory Visit: Payer: Medicare Other | Attending: Orthopaedic Surgery | Admitting: Physical Therapy

## 2018-08-22 ENCOUNTER — Encounter: Payer: Self-pay | Admitting: Physical Therapy

## 2018-08-22 DIAGNOSIS — R42 Dizziness and giddiness: Secondary | ICD-10-CM | POA: Diagnosis not present

## 2018-08-22 DIAGNOSIS — M6281 Muscle weakness (generalized): Secondary | ICD-10-CM | POA: Insufficient documentation

## 2018-08-22 DIAGNOSIS — M25671 Stiffness of right ankle, not elsewhere classified: Secondary | ICD-10-CM | POA: Diagnosis not present

## 2018-08-22 DIAGNOSIS — R262 Difficulty in walking, not elsewhere classified: Secondary | ICD-10-CM | POA: Diagnosis not present

## 2018-08-22 DIAGNOSIS — M25571 Pain in right ankle and joints of right foot: Secondary | ICD-10-CM | POA: Diagnosis not present

## 2018-08-22 DIAGNOSIS — R2681 Unsteadiness on feet: Secondary | ICD-10-CM | POA: Diagnosis not present

## 2018-08-22 NOTE — Therapy (Signed)
[facility] Outpatient Rehabilitation [facility] [address]  [address] [place], [place], [address] Phone: [phone]   Fax:  [phone]  Physical Therapy Treatment  Patient Details  Name: Kristy [name] MRN: [id number] Date of Birth: 03-18-1941 Referring Provider (PT): Doneen Poisson, MD   Encounter Date: 08/22/2018  PT End of Session - 08/22/18 1200    Visit Number  2    Number of Visits  13    Date for PT Re-Evaluation  09/28/18    Authorization Type  Medicare and BCBS    PT Start Time  1017    PT Stop Time  1114    PT Time Calculation (min)  57 min    Activity Tolerance  Patient tolerated treatment well;Patient limited by pain    Behavior During Therapy  [facility] for tasks assessed/performed       Past Medical History:  Diagnosis Date  . Allergic rhinitis, seasonal   . Arthritis   . Asthma   . GERD (gastroesophageal reflux disease)   . History of colon polyps   . Hypertension   . Rotator cuff tear, right     Past Surgical History:  Procedure Laterality Date  . CHOLECYSTECTOMY OPEN  AGE 77  . INGUINAL HERNIA REPAIR Right AGE 77s  . KNEE ARTHROSCOPY Left 2014  . LUMBAR DISC SURGERY  x2  LAST DATE EARLY 2000s  . ORIF ANKLE FRACTURE Right 06/24/2018   Procedure: OPEN REDUCTION INTERNAL FIXATION (ORIF) RIGHT ANKLE FRACTURE;  Surgeon: Kathryne Hitch, MD;  Location: [facility];  Service: Orthopedics;  Laterality: Right;  . ROTATOR CUFF REPAIR Left 01/2015  . TONSILLECTOMY  AGE 2  . VAGINAL HYSTERECTOMY  AGE 77   WITH BSO    There were no vitals filed for this visit.  Subjective Assessment - 08/22/18 1018    Subjective  Patient reports she had to do a lot of standing and walking for her daughter's wedding. R ankle has been swelling and a bit sore from that.     Pertinent History  R RTC tear, HTN, GERD, asthma, L RTC repair, lumbar disc surgery    Diagnostic tests  08/04/18 R ankle xray: 3 views right ankle shows a talus well located within  the ankle mortise no signs of diastases. Medial lateral hardware is without any signs of failure. Fractures were reduced anatomically with good  further consolidation. No other fractures identified    Patient Stated Goals  "get back to my life"    Currently in Pain?  Yes    Pain Score  4     Pain Location  Knee    Pain Orientation  Left    Pain Descriptors / Indicators  Dull    Pain Type  Chronic pain    Multiple Pain Sites  Yes    Pain Score  4    Pain Location  Shoulder    Pain Orientation  Right    Pain Descriptors / Indicators  Aching    Pain Type  Chronic pain                       [facility] Adult PT Treatment/Exercise - 08/22/18 0001      Exercises   Exercises  Ankle      Modalities   Modalities  Vasopneumatic      Vasopneumatic   Number Minutes Vasopneumatic   15 minutes    Vasopnuematic Location   Ankle   R   Vasopneumatic  Pressure  Medium    Vasopneumatic Temperature   coldest      Ankle Exercises: Stretches   Soleus Stretch  1 rep    Soleus Stretch Limitations  at wall; c/o R ankle pain- discontinued after 1 rep    Gastroc Stretch  2 reps;30 seconds;Limitations    Gastroc Stretch Limitations  on wall      Ankle Exercises: Aerobic   Nustep  L1 x UEs/LEs      Ankle Exercises: Standing   SLS  R SLS at counter top   3x10 sec; intermittent compensations with L foot   Heel Raises  Both;10 reps;Limitations    Heel Raises Limitations  standing heel/toe raise at counter top    Toe Walk (Round Trip)  standing at counter top resisted marching with red TB around toes x20    Other Standing Ankle Exercises  B tandem stance at counter top 2x20 sec each LE   manual perturbations in all directions for 2nd set   Other Standing Ankle Exercises  B feet anterior/posterior and M/L weight shifts on foam 5 cycles in all directions with hands hovering over counter top       Ankle Exercises: Seated   Other Seated Ankle Exercises  R ankle 4 way ankle with red TB  x20 each direction   reporting pain with inversion            PT Education - 08/22/18 1106    Education Details  administered yellow TB to perform R ankle INV/EV     Person(s) Educated  Patient    Methods  Explanation;Demonstration;Tactile cues;Verbal cues    Comprehension  Verbalized understanding;Returned demonstration       PT Short Term Goals - 08/22/18 1206      PT SHORT TERM GOAL #1   Title  Patient to be independent with initial HEP.    Time  3    Period  Weeks    Status  On-going        PT Long Term Goals - 08/22/18 1206      PT LONG TERM GOAL #1   Title  Patient to be independent with advanced HEP.    Time  6    Period  Weeks    Status  On-going      PT LONG TERM GOAL #2   Title  Patient to demonstrate [facility] and pain-free R ankle AROM/PROM.    Time  6    Period  Weeks    Status  On-going      PT LONG TERM GOAL #3   Title  Patient to demonstrate >=4+/5 strength in B LEs.     Time  6    Period  Weeks    Status  On-going      PT LONG TERM GOAL #4   Title  Patient to demonstrate R SLS for 10 sec without LOB.    Time  6    Period  Weeks    Status  On-going      PT LONG TERM GOAL #5   Title  Patient to report tolerance of 1 hour of walking without pain limiting.     Time  6    Period  Weeks    Status  On-going      PT LONG TERM GOAL #6   Title  Patient to return to bowling without pain limiting.     Time  6    Period  Weeks    Status  On-going            Plan - 08/22/18 1201    Clinical Impression Statement  Patient reporting increase in R ankle pain after prolonged standing and walking at her daughter's wedding. Reviewed HEP- patient with pain at either side of ankle near incisions with ankle inversion and eversion. Better tolerance of exercise with yellow band rather than red band. Updated exercise with yellow band for HEP. C/o R ankle pain with soleus stretch which was discontinued. Patient with good balance with SLS, tandem stand, and  weight shifts on foam. More difficulty with SLS without use of L foot to compensate. Patient with considerable R lateral ankle swelling at end of session. Addressed edema with [place] to R ankle. No complaints at end of session.     Clinical Impairments Affecting Rehab Potential  R RTC tear, HTN, GERD, asthma, L RTC repair, lumbar disc surgery    PT Treatment/Interventions  ADLs/Self Care Home Management;Cryotherapy;Electrical Stimulation;Moist Heat;Ultrasound;DME Instruction;Gait training;Stair training;Functional mobility training;Therapeutic activities;Therapeutic exercise;Manual techniques;Orthotic Fit/Training;Patient/family education;Neuromuscular re-education;Balance training;Scar mobilization;Passive range of motion;Dry needling;Energy conservation;Splinting;Taping;Vasopneumatic Device    Consulted and Agree with Plan of Care  Patient       Patient will benefit from skilled therapeutic intervention in order to improve the following deficits and impairments:  Decreased endurance, Hypomobility, Increased edema, Decreased scar mobility, Decreased activity tolerance, Decreased strength, Pain, Difficulty walking, Decreased mobility, Decreased balance, Decreased range of motion, Impaired flexibility, Improper body mechanics  Visit Diagnosis: Pain in right ankle and joints of right foot  Stiffness of right ankle, not elsewhere classified  Difficulty in walking, not elsewhere classified  Muscle weakness (generalized)     Problem List Patient Active Problem List   Diagnosis Date Noted  . Trimalleolar fracture of right ankle 06/24/2018  . Closed trimalleolar fracture of right ankle 06/21/2018  . Osteoarthritis 09/22/2012  . UNSPECIFIED DISORDER TEETH&SUPPORTING STRUCTURES 09/09/2010  . Essential hypertension 04/07/2007  . Allergic rhinitis 04/07/2007  . Asthma 04/07/2007  . GERD 04/07/2007  . COLONIC POLYPS, HX OF 04/07/2007    Anette Guarneri, PT, DPT 08/22/18 12:09  PM   [facility] Outpatient Rehabilitation [facility] [address]  [address] [place], [place], [address] Phone: [phone]   Fax:  [phone]  Name: Kristy [name] MRN: [id number] Date of Birth: 07/11/1941

## 2018-08-25 DIAGNOSIS — M150 Primary generalized (osteo)arthritis: Secondary | ICD-10-CM | POA: Diagnosis not present

## 2018-08-25 DIAGNOSIS — S82891A Other fracture of right lower leg, initial encounter for closed fracture: Secondary | ICD-10-CM | POA: Diagnosis not present

## 2018-08-25 DIAGNOSIS — Z79899 Other long term (current) drug therapy: Secondary | ICD-10-CM | POA: Diagnosis not present

## 2018-08-25 DIAGNOSIS — E663 Overweight: Secondary | ICD-10-CM | POA: Diagnosis not present

## 2018-08-25 DIAGNOSIS — M154 Erosive (osteo)arthritis: Secondary | ICD-10-CM | POA: Diagnosis not present

## 2018-08-25 DIAGNOSIS — M2550 Pain in unspecified joint: Secondary | ICD-10-CM | POA: Diagnosis not present

## 2018-08-25 DIAGNOSIS — Z6829 Body mass index (BMI) 29.0-29.9, adult: Secondary | ICD-10-CM | POA: Diagnosis not present

## 2018-08-26 ENCOUNTER — Encounter

## 2018-08-29 ENCOUNTER — Ambulatory Visit: Payer: Medicare Other

## 2018-08-29 DIAGNOSIS — M6281 Muscle weakness (generalized): Secondary | ICD-10-CM | POA: Diagnosis not present

## 2018-08-29 DIAGNOSIS — R262 Difficulty in walking, not elsewhere classified: Secondary | ICD-10-CM | POA: Diagnosis not present

## 2018-08-29 DIAGNOSIS — M25671 Stiffness of right ankle, not elsewhere classified: Secondary | ICD-10-CM | POA: Diagnosis not present

## 2018-08-29 DIAGNOSIS — R42 Dizziness and giddiness: Secondary | ICD-10-CM | POA: Diagnosis not present

## 2018-08-29 DIAGNOSIS — M25571 Pain in right ankle and joints of right foot: Secondary | ICD-10-CM | POA: Diagnosis not present

## 2018-08-29 DIAGNOSIS — R2681 Unsteadiness on feet: Secondary | ICD-10-CM | POA: Diagnosis not present

## 2018-08-29 NOTE — Therapy (Addendum)
[facility] [address]  [address] [place], [place], [address] Phone: [phone]   Fax:  [phone]  Physical Therapy Treatment  Patient Details  Name: Kristy [name] MRN: [id number] Date of Birth: 1941/08/21 Referring Provider (PT): Doneen Poisson, MD   Encounter Date: 08/29/2018  PT End of Session - 08/29/18 1018    Visit Number  3    Number of Visits  13    Date for PT Re-Evaluation  09/28/18    Authorization Type  Medicare and BCBS    PT Start Time  1012    PT Stop Time  1113    PT Time Calculation (min)  61 min    Activity Tolerance  Patient tolerated treatment well;Patient limited by pain    Behavior During Therapy  [facility] for tasks assessed/performed       Past Medical History:  Diagnosis Date  . Allergic rhinitis, seasonal   . Arthritis   . Asthma   . GERD (gastroesophageal reflux disease)   . History of colon polyps   . Hypertension   . Rotator cuff tear, right     Past Surgical History:  Procedure Laterality Date  . CHOLECYSTECTOMY OPEN  AGE 70  . INGUINAL HERNIA REPAIR Right AGE 70s  . KNEE ARTHROSCOPY Left 2014  . LUMBAR DISC SURGERY  x2  LAST DATE EARLY 2000s  . ORIF ANKLE FRACTURE Right 06/24/2018   Procedure: OPEN REDUCTION INTERNAL FIXATION (ORIF) RIGHT ANKLE FRACTURE;  Surgeon: Kathryne Hitch, MD;  Location: [facility];  Service: Orthopedics;  Laterality: Right;  . ROTATOR CUFF REPAIR Left 01/2015  . TONSILLECTOMY  AGE 89  . VAGINAL HYSTERECTOMY  AGE 57   WITH BSO    There were no vitals filed for this visit.  Subjective Assessment - 08/29/18 1015    Subjective  Pt. reporting she is able to walk 3/4 of a mile with dog without increased pain.  Did have some soreness after last visit which last one day.      Pertinent History  R RTC tear, HTN, GERD, asthma, L RTC repair, lumbar disc surgery    Diagnostic tests  08/04/18 R ankle xray: 3 views right ankle shows a talus well located  within the ankle mortise no signs of diastases. Medial lateral hardware is without any signs of failure. Fractures were reduced anatomically with good  further consolidation. No other fractures identified    Patient Stated Goals  "get back to my life"    Currently in Pain?  No/denies    Pain Score  0-No pain   up to 6/10 at worst without any known trigger   Pain Type  Chronic pain    Aggravating Factors   unsure     Multiple Pain Sites  No                       [facility] Adult PT Treatment/Exercise - 08/29/18 1047      Vasopneumatic   Number Minutes Vasopneumatic   15 minutes    Vasopnuematic Location   Ankle    Vasopneumatic Pressure  Medium    Vasopneumatic Temperature   coldest      Manual Therapy   Manual Therapy  Soft tissue mobilization    Manual therapy comments  supine    Soft tissue mobilization  R medial and lateral ankle scar massage       Ankle Exercises: Stretches   Soleus Stretch  1 rep  Soleus Stretch Limitations  at wall; c/o R ankle pain     Gastroc Stretch  2 reps;30 seconds;Limitations    Gastroc Stretch Limitations  on wall      Ankle Exercises: Seated   BAPS  Sitting;Level 2;10 reps   R/L, front/back   BAPS Limitations  Difficulty isolating movements       Ankle Exercises: Aerobic   Nustep  L1 x UEs/LEs      Ankle Exercises: Standing   Vector Stance  Right;15 seconds;2 reps   with opposite LE cone toe-touch    SLS  R SLS at chair 2 x 20 sec     Other Standing Ankle Exercises  B tandem stance at counter top 2x20 sec eyes closed     Other Standing Ankle Exercises  B staggered stance eyes closed with head turns at chair with intermittent UE support 2 x 20 sec each; supervision provided              PT Education - 08/29/18 1223    Education Details  HEP update    Person(s) Educated  Patient    Methods  Explanation;Demonstration;Verbal cues;Handout    Comprehension  Verbalized understanding;Returned demonstration;Verbal  cues required;Need further instruction       PT Short Term Goals - 08/22/18 1206      PT SHORT TERM GOAL #1   Title  Patient to be independent with initial HEP.    Time  3    Period  Weeks    Status  On-going        PT Long Term Goals - 08/22/18 1206      PT LONG TERM GOAL #1   Title  Patient to be independent with advanced HEP.    Time  6    Period  Weeks    Status  On-going      PT LONG TERM GOAL #2   Title  Patient to demonstrate [facility] and pain-free R ankle AROM/PROM.    Time  6    Period  Weeks    Status  On-going      PT LONG TERM GOAL #3   Title  Patient to demonstrate >=4+/5 strength in B LEs.     Time  6    Period  Weeks    Status  On-going      PT LONG TERM GOAL #4   Title  Patient to demonstrate R SLS for 10 sec without LOB.    Time  6    Period  Weeks    Status  On-going      PT LONG TERM GOAL #5   Title  Patient to report tolerance of 1 hour of walking without pain limiting.     Time  6    Period  Weeks    Status  On-going      PT LONG TERM GOAL #6   Title  Patient to return to bowling without pain limiting.     Time  6    Period  Weeks    Status  On-going            Plan - 08/29/18 1034    Clinical Impression Statement  Kristy [name] reporting ~ 1 day of muscular soreness following last visit.  Tolerated mild progression of proprioception and strengthening activities well today.  HEP updated.  Will continue to progress toward goals.      Clinical Impairments Affecting Rehab Potential  R RTC tear, HTN, GERD, asthma, L RTC repair,  lumbar disc surgery    PT Treatment/Interventions  ADLs/Self Care Home Management;Cryotherapy;Electrical Stimulation;Moist Heat;Ultrasound;DME Instruction;Gait training;Stair training;Functional mobility training;Therapeutic activities;Therapeutic exercise;Manual techniques;Orthotic Fit/Training;Patient/family education;Neuromuscular re-education;Balance training;Scar mobilization;Passive range of motion;Dry needling;Energy  conservation;Splinting;Taping;Vasopneumatic Device    Consulted and Agree with Plan of Care  Patient       Patient will benefit from skilled therapeutic intervention in order to improve the following deficits and impairments:  Decreased endurance, Hypomobility, Increased edema, Decreased scar mobility, Decreased activity tolerance, Decreased strength, Pain, Difficulty walking, Decreased mobility, Decreased balance, Decreased range of motion, Impaired flexibility, Improper body mechanics  Visit Diagnosis: Pain in right ankle and joints of right foot  Stiffness of right ankle, not elsewhere classified  Difficulty in walking, not elsewhere classified  Muscle weakness (generalized)     Problem List Patient Active Problem List   Diagnosis Date Noted  . Trimalleolar fracture of right ankle 06/24/2018  . Closed trimalleolar fracture of right ankle 06/21/2018  . Osteoarthritis 09/22/2012  . UNSPECIFIED DISORDER TEETH&SUPPORTING STRUCTURES 09/09/2010  . Essential hypertension 04/07/2007  . Allergic rhinitis 04/07/2007  . Asthma 04/07/2007  . GERD 04/07/2007  . COLONIC POLYPS, HX OF 04/07/2007    Kermit Balo, PTA 08/29/18 12:27 PM   [facility] [address]  [address] [place], [place], [address] Phone: [phone]   Fax:  [phone]  Name: Kristy [name] MRN: [id number] Date of Birth: 03/12/1941

## 2018-08-31 ENCOUNTER — Ambulatory Visit: Payer: Medicare Other

## 2018-08-31 DIAGNOSIS — R262 Difficulty in walking, not elsewhere classified: Secondary | ICD-10-CM

## 2018-08-31 DIAGNOSIS — M25671 Stiffness of right ankle, not elsewhere classified: Secondary | ICD-10-CM | POA: Diagnosis not present

## 2018-08-31 DIAGNOSIS — M6281 Muscle weakness (generalized): Secondary | ICD-10-CM | POA: Diagnosis not present

## 2018-08-31 DIAGNOSIS — R2681 Unsteadiness on feet: Secondary | ICD-10-CM | POA: Diagnosis not present

## 2018-08-31 DIAGNOSIS — M25571 Pain in right ankle and joints of right foot: Secondary | ICD-10-CM

## 2018-08-31 DIAGNOSIS — R42 Dizziness and giddiness: Secondary | ICD-10-CM | POA: Diagnosis not present

## 2018-08-31 NOTE — Therapy (Signed)
[facility] [address]  [place], [name], [address] Phone: [phone]   Fax:  [phone]  Physical Therapy Treatment  Patient Details  Name: Kristy [name] MRN: [id number] Date of Birth: 1941/01/16 Referring Provider (PT): Jean Rosenthal, MD   Encounter Date: 08/31/2018  PT End of Session - 08/31/18 1018    Visit Number  4    Number of Visits  13    Date for PT Re-Evaluation  09/28/18    Authorization Type  Medicare and BCBS    PT Start Time  1013    PT Stop Time  1122    PT Time Calculation (min)  69 min    Activity Tolerance  Patient tolerated treatment well;Patient limited by pain    Behavior During Therapy  [facility] for tasks assessed/performed       Past Medical History:  Diagnosis Date  . Allergic rhinitis, seasonal   . Arthritis   . Asthma   . GERD (gastroesophageal reflux disease)   . History of colon polyps   . Hypertension   . Rotator cuff tear, right     Past Surgical History:  Procedure Laterality Date  . CHOLECYSTECTOMY OPEN  AGE 14  . INGUINAL HERNIA REPAIR Right AGE 89s  . KNEE ARTHROSCOPY Left 2014  . LUMBAR [place] SURGERY  x2  LAST DATE EARLY 2000s  . ORIF ANKLE FRACTURE Right 06/24/2018   Procedure: OPEN REDUCTION INTERNAL FIXATION (ORIF) RIGHT ANKLE FRACTURE;  Surgeon: Mcarthur Rossetti, MD;  Location: [facility];  Service: Orthopedics;  Laterality: Right;  . ROTATOR CUFF REPAIR Left 01/2015  . TONSILLECTOMY  AGE 74  . VAGINAL HYSTERECTOMY  AGE 81   WITH BSO    There were no vitals filed for this visit.  Subjective Assessment - 08/31/18 1016    Subjective  Pt. reporting she has difficulty "balancing" with latest HEP update however pain free with it.      Pertinent History  R RTC tear, HTN, GERD, asthma, L RTC repair, lumbar disc surgery    Diagnostic tests  08/04/18 R ankle xray: 3 views right ankle shows a talus well located within the ankle mortise no signs of diastases.  Medial lateral hardware is without any signs of failure. Fractures were reduced anatomically with good  further consolidation. No other fractures identified    Patient Stated Goals  "get back to my life"    Currently in Pain?  No/denies    Pain Score  0-No pain   3/10 pain at worst    Pain Location  Knee    Pain Orientation  Left    Pain Descriptors / Indicators  Dull    Pain Type  Chronic pain    Multiple Pain Sites  No         [facility] PT Assessment - 08/31/18 1053      AROM   AROM Assessment Site  Ankle    Right/Left Ankle  Left;Right    Right Ankle Dorsiflexion  10    Right Ankle Plantar Flexion  48    Right Ankle Inversion  24    Right Ankle Eversion  12      Strength   Strength Assessment Site  Hip;Knee;Ankle    Right/Left Hip  Right;Left    Right Hip Flexion  4/5    Right Hip ABduction  4/5    Right Hip ADduction  4/5    Left Hip Flexion  4+/5    Left  Hip ABduction  4+/5    Left Hip ADduction  4+/5    Right/Left Knee  Right;Left    Right Knee Flexion  4+/5    Right Knee Extension  4+/5    Left Knee Flexion  4+/5    Left Knee Extension  4+/5    Right/Left Ankle  Right;Left    Right Ankle Dorsiflexion  4+/5    Right Ankle Plantar Flexion  4/5    Right Ankle Inversion  4+/5    Right Ankle Eversion  4+/5    Left Ankle Dorsiflexion  4+/5    Left Ankle Plantar Flexion  5/5    Left Ankle Inversion  4+/5    Left Ankle Eversion  4+/5                   [facility] Adult PT Treatment/Exercise - 08/31/18 1032      Neuro Re-ed    Neuro Re-ed Details   Alternating cone nock over/righting x 7 cones; supervision; Side stepping with yellow looped TB at forefoot x 3 laps down/back on blue foam beam at counter; intermittent       Manual Therapy   Manual Therapy  Joint mobilization    Manual therapy comments  seated with R LE resting on stool     Joint Mobilization  R ankle A/P mobs for improved ROM      Ankle Exercises: Aerobic   Nustep  L3 x 65mn UEs/LEs       Ankle Exercises: Standing   SLS  R SLS at chair 2 x 20 sec; light UE support; eyes closed 2nd set    Heel Raises  15 reps;3 seconds;Both    Other Standing Ankle Exercises  B tandem stance at counter top 2x20 sec eyes closed     Other Standing Ankle Exercises  Alternating step to BOSU ball (up) x 10 rpes each way; Cues required for LE alignment       Ankle Exercises: Stretches   Plantar Fascia Stretch  1 rep;30 seconds   R + gastroc on wall    Soleus Stretch  1 rep;30 seconds    Soleus Stretch Limitations  at wall     Gastroc Stretch  2 reps;30 seconds;Limitations    Gastroc Stretch Limitations  on wall    Other Stretch  R SLS in rhomberg position x 10 sec; 2 minor LOB however able to correct      Ankle Exercises: Supine   Isometrics  R DF with red TB x 15 reps    Other Supine Ankle Exercises  Alternating march supine with heels on peanut p-ball and isometric DF position with yellow looped TB at ankles x 10 reps each way                PT Short Term Goals - 08/31/18 1155      PT SHORT TERM GOAL #1   Title  Patient to be independent with initial HEP.    Time  3    Period  Weeks    Status  Achieved        PT Long Term Goals - 08/31/18 1106      PT LONG TERM GOAL #1   Title  Patient to be independent with advanced HEP.    Time  6    Period  Weeks    Status  Partially Met      PT LONG TERM GOAL #2   Title  Patient to demonstrate [facility] pain-free R  ankle AROM/PROM.    Time  6    Period  Weeks    Status  Partially Met      PT LONG TERM GOAL #3   Title  Patient to demonstrate >=4+/5 strength in B LEs.     Time  6    Period  Weeks    Status  Partially Met      PT LONG TERM GOAL #4   Title  Patient to demonstrate R SLS for 10 sec without LOB.    Time  6    Period  Weeks    Status  On-going      PT LONG TERM GOAL #5   Title  Patient to report tolerance of 1 hour of walking without pain limiting.     Time  6    Period  Weeks    Status  On-going      PT  LONG TERM GOAL #6   Title  Patient to return to bowling without pain limiting.     Time  6    Period  Weeks    Status  On-going            Plan - 08/31/18 1019    Clinical Impression Statement  Pt. reporting she is feeling challenged by HEP however feels she is tolerating well at this point.  Progressed proprioception/balance training, and LE strengthening activities today without issue.  Most difficulty today with SLS on compliant surface with eyes closed requiring min support from chair and CGA from therapist.  Pt. able to demo improvement in LE strength and R ankle ROM today with testing (see flowsheet).  Ended with ice/compression to R ankle to reduce post exercise swelling and soreness.  Progressing well toward goals.      Clinical Impairments Affecting Rehab Potential  R RTC tear, HTN, GERD, asthma, L RTC repair, lumbar disc surgery    PT Treatment/Interventions  ADLs/Self Care Home Management;Cryotherapy;Electrical Stimulation;Moist Heat;Ultrasound;DME Instruction;Gait training;Stair training;Functional mobility training;Therapeutic activities;Therapeutic exercise;Manual techniques;Orthotic Fit/Training;Patient/family education;Neuromuscular re-education;Balance training;Scar mobilization;Passive range of motion;Dry needling;Energy conservation;Splinting;Taping;Vasopneumatic Device    Consulted and Agree with Plan of Care  Patient       Patient will benefit from skilled therapeutic intervention in order to improve the following deficits and impairments:  Decreased endurance, Hypomobility, Increased edema, Decreased scar mobility, Decreased activity tolerance, Decreased strength, Pain, Difficulty walking, Decreased mobility, Decreased balance, Decreased range of motion, Impaired flexibility, Improper body mechanics  Visit Diagnosis: Pain in right ankle and joints of right foot  Stiffness of right ankle, not elsewhere classified  Difficulty in walking, not elsewhere  classified  Muscle weakness (generalized)     Problem List Patient Active Problem List   Diagnosis Date Noted  . Trimalleolar fracture of right ankle 06/24/2018  . Closed trimalleolar fracture of right ankle 06/21/2018  . Osteoarthritis 09/22/2012  . UNSPECIFIED DISORDER TEETH&SUPPORTING STRUCTURES 09/09/2010  . Essential hypertension 04/07/2007  . Allergic rhinitis 04/07/2007  . Asthma 04/07/2007  . GERD 04/07/2007  . COLONIC POLYPS, HX OF 04/07/2007    Bess Harvest, PTA 08/31/18 11:56 AM   [facility] [address]  [place], [name], [address] Phone: [phone]   Fax:  [phone]  Name: Kristy [name] MRN: [id number] Date of Birth: 09/24/1941

## 2018-09-01 ENCOUNTER — Ambulatory Visit (INDEPENDENT_AMBULATORY_CARE_PROVIDER_SITE_OTHER): Payer: Medicare Other | Admitting: Orthopaedic Surgery

## 2018-09-01 ENCOUNTER — Ambulatory Visit (INDEPENDENT_AMBULATORY_CARE_PROVIDER_SITE_OTHER): Payer: Medicare Other

## 2018-09-01 ENCOUNTER — Encounter (INDEPENDENT_AMBULATORY_CARE_PROVIDER_SITE_OTHER): Payer: Self-pay | Admitting: Orthopaedic Surgery

## 2018-09-01 DIAGNOSIS — S82851D Displaced trimalleolar fracture of right lower leg, subsequent encounter for closed fracture with routine healing: Secondary | ICD-10-CM

## 2018-09-01 NOTE — Progress Notes (Signed)
Patient is very pleasant and active 77 year old female who is 69 days status post a reduction in her fixation of a trimalleolar ankle fracture of her right ankle.  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  She appears much younger than her stated age.  She is doing well overall.  She ambulates with a cane but only when she is out in unfamiliar places but not at home.  She was not using cane when she fell and dislocated her ankle.  She has no real issues at all.  She has already been to physical therapy.  On examination she has excellent range of motion of her right ankle.  It is ligamentously stable.  The swelling is minimal.  Her incisions of healed nicely.  2 views of the right ankle show the fracture is healed completely in the ankle is well located in the ankle mortise is well aligned.  At this point she will follow-up as needed.  She can get back to bowling as well.  She will wear ASO at first until she is comfortable going without it.  All question concerns were answered and addressed.

## 2018-09-05 ENCOUNTER — Ambulatory Visit: Payer: Medicare Other

## 2018-09-05 DIAGNOSIS — M25571 Pain in right ankle and joints of right foot: Secondary | ICD-10-CM | POA: Diagnosis not present

## 2018-09-05 DIAGNOSIS — R2681 Unsteadiness on feet: Secondary | ICD-10-CM | POA: Diagnosis not present

## 2018-09-05 DIAGNOSIS — M6281 Muscle weakness (generalized): Secondary | ICD-10-CM | POA: Diagnosis not present

## 2018-09-05 DIAGNOSIS — R42 Dizziness and giddiness: Secondary | ICD-10-CM | POA: Diagnosis not present

## 2018-09-05 DIAGNOSIS — R262 Difficulty in walking, not elsewhere classified: Secondary | ICD-10-CM | POA: Diagnosis not present

## 2018-09-05 DIAGNOSIS — M25671 Stiffness of right ankle, not elsewhere classified: Secondary | ICD-10-CM | POA: Diagnosis not present

## 2018-09-05 NOTE — Therapy (Signed)
[facility]risty [name] [address]  [name], [name], [address] Phone: [phone]   Fax:  [phone]  Physical Therapy Treatment  Patient Details  Name: Kristy [name] MRN: [id number] Date of Birth: 1940-11-07 Referring Provider (PT): Jean Rosenthal, MD   Encounter Date: 09/05/2018  PT End of Session - 09/05/18 1027    Visit Number  5    Number of Visits  13    Date for PT Re-Evaluation  09/28/18    Authorization Type  Medicare and BCBS    PT Start Time  1021    PT Stop Time  1100    PT Time Calculation (min)  39 min    Activity Tolerance  Patient tolerated treatment well;Patient limited by pain    Behavior During Therapy  [facility] for tasks assessed/performed       Past Medical History:  Diagnosis Date  . Allergic rhinitis, seasonal   . Arthritis   . Asthma   . GERD (gastroesophageal reflux disease)   . History of colon polyps   . Hypertension   . Rotator cuff tear, right     Past Surgical History:  Procedure Laterality Date  . CHOLECYSTECTOMY OPEN  AGE 17  . INGUINAL HERNIA REPAIR Right AGE 64s  . KNEE ARTHROSCOPY Left 2014  . LUMBAR [place] SURGERY  x2  LAST DATE EARLY 2000s  . ORIF ANKLE FRACTURE Right 06/24/2018   Procedure: OPEN REDUCTION INTERNAL FIXATION (ORIF) RIGHT ANKLE FRACTURE;  Surgeon: Mcarthur Rossetti, MD;  Location: [facility];  Service: Orthopedics;  Laterality: Right;  . ROTATOR CUFF REPAIR Left 01/2015  . TONSILLECTOMY  AGE 48  . VAGINAL HYSTERECTOMY  AGE 71   WITH BSO    There were no vitals filed for this visit.  Subjective Assessment - 09/05/18 1024    Subjective  Pt. reporting some R swelling after returning to her "regular routine" which has improved some with ice.     Pertinent History  R RTC tear, HTN, GERD, asthma, L RTC repair, lumbar disc surgery    Diagnostic tests  08/04/18 R ankle xray: 3 views right ankle shows a talus well located within the ankle mortise no signs of  diastases. Medial lateral hardware is without any signs of failure. Fractures were reduced anatomically with good  further consolidation. No other fractures identified    Patient Stated Goals  "get back to my life"    Currently in Pain?  No/denies    Pain Score  0-No pain    Multiple Pain Sites  No                       [facility] Adult PT Treatment/Exercise - 09/05/18 1045      Manual Therapy   Manual Therapy  Joint mobilization    Manual therapy comments  supine     Joint Mobilization  R ankle A/P mobs for improved ROM      Ankle Exercises: Standing   Vector Stance  Right;2 reps    Vector Stance Limitations  B pallof press with red TB R SLS 2 x 10 rpes each way - pt. with difficulty     SLS  R SLS at chair 3 x 10 sec; pt. with 1 UE touch down for support     Other Standing Ankle Exercises  TRX + heel raise at bottom of motion x 15 reps       Ankle Exercises: Aerobic   Nustep  L3 x 27mn UEs/LEs      Ankle Exercises: Supine   Other Supine Ankle Exercises  Alternating march supine with heels on peanut p-ball and isometric DF position with red looped TB at ankles x 15 reps each way       Ankle Exercises: Stretches   Plantar Fascia Stretch  1 rep;30 seconds   + gastroc stretch on wall    Soleus Stretch  1 rep;30 seconds    Soleus Stretch Limitations  at wall     Gastroc Stretch  2 reps;30 seconds;Limitations    Gastroc Stretch Limitations  on wall    Other Stretch  long sitting R gastroc stretch with strap x 30 sec               PT Short Term Goals - 08/31/18 1155      PT SHORT TERM GOAL #1   Title  Patient to be independent with initial HEP.    Time  3    Period  Weeks    Status  Achieved        PT Long Term Goals - 08/31/18 1106      PT LONG TERM GOAL #1   Title  Patient to be independent with advanced HEP.    Time  6    Period  Weeks    Status  Partially Met      PT LONG TERM GOAL #2   Title  [facility] to [facility]  [facility] AROM/PROM.    Time  6    Period  Weeks    Status  Partially Met      PT LONG TERM GOAL #3   Title  Patient to demonstrate >=4+/5 strength in B LEs.     Time  6    Period  Weeks    Status  Partially Met      PT LONG TERM GOAL #4   Title  Patient to demonstrate R SLS for 10 sec without LOB.    Time  6    Period  Weeks    Status  On-going      PT LONG TERM GOAL #5   Title  Patient to report tolerance of 1 hour of walking without pain limiting.     Time  6    Period  Weeks    Status  On-going      PT LONG TERM GOAL #6   Title  Patient to return to bowling without pain limiting.     Time  6    Period  Weeks    Status  On-going            Plan - 09/05/18 1027    Clinical Impression Statement  NIzora Galareporting MD cleared her to return to bowling with brace.  Session focused on progression of SLS stability activities with pt. tolerating all well.  Most difficulty with R SLS Pallof Press with red TB today.  Able to demo reduced need for UE support with R SLS today nearly meeting LTG of 10 sec R SLS without UE support.  Making progress toward goals.     Clinical Impairments Affecting Rehab Potential  R RTC tear, HTN, GERD, asthma, L RTC repair, lumbar disc surgery    PT Treatment/Interventions  ADLs/Self Care Home Management;Cryotherapy;Electrical Stimulation;Moist Heat;Ultrasound;DME Instruction;Gait training;Stair training;Functional mobility training;Therapeutic activities;Therapeutic exercise;Manual techniques;Orthotic Fit/Training;Patient/family education;Neuromuscular re-education;Balance training;Scar mobilization;Passive range of motion;Dry needling;Energy conservation;Splinting;Taping;Vasopneumatic Device    Consulted and Agree with Plan of Care  Patient  Patient will benefit from skilled therapeutic intervention in order to improve the following deficits and impairments:  Decreased endurance, Hypomobility, Increased edema, Decreased scar mobility, Decreased  activity tolerance, Decreased strength, Pain, Difficulty walking, Decreased mobility, Decreased balance, Decreased range of motion, Impaired flexibility, Improper body mechanics  Visit Diagnosis: Pain in right ankle and joints of right foot  Stiffness of right ankle, not elsewhere classified  Difficulty in walking, not elsewhere classified  Muscle weakness (generalized)     Problem List Patient Active Problem List   Diagnosis Date Noted  . Trimalleolar fracture of right ankle 06/24/2018  . Closed trimalleolar fracture of right ankle 06/21/2018  . Osteoarthritis 09/22/2012  . UNSPECIFIED DISORDER TEETH&SUPPORTING STRUCTURES 09/09/2010  . Essential hypertension 04/07/2007  . Allergic rhinitis 04/07/2007  . Asthma 04/07/2007  . GERD 04/07/2007  . COLONIC POLYPS, HX OF 04/07/2007    Bess Harvest, PTA 09/05/18 12:26 PM   [name] [address]  [place], [name], [address] Phone: [phone]   Fax:  [phone]  Name: Kristy [name] MRN: [id number] Date of Birth: August 05, 1941

## 2018-09-07 ENCOUNTER — Encounter: Payer: Medicare Other | Admitting: Physical Therapy

## 2018-09-07 NOTE — Progress Notes (Signed)
Established Patient Office Visit     CC/Reason for Visit: To establish care and evaluation of dizziness  HPI: Kristy [name] is a 77 y.o. female who is coming in today for the above mentioned reasons. Due for annual medicare wellness in March 2020. Past Medical History is significant for: asthma, recent ankle fracture s/p repair, HTN, GERD all of which are stable.  She states that about 3 weeks ago she started experiencing dizziness.  There has been no associated nausea and vomiting.  She does not feel like she is going to fall.  She has a sensation of fullness in her right sinus and mild hearing loss in her right ear.  She states she is just getting over an upper respiratory infection.  She has had no fever or chills.  She has had no blurry or double vision.  She has not passed out.  She is no longer taking the hydrocodone that was prescribed by her orthopedic surgeon after her ankle fracture repair.  She states the dizziness feels like the room is spinning around her.  There have been no new medications.  She has not had palpitations.   Past Medical/Surgical History: Past Medical History:  Diagnosis Date  . Allergic rhinitis, seasonal   . Arthritis   . Asthma   . GERD (gastroesophageal reflux disease)   . History of colon polyps   . Hypertension   . Rotator cuff tear, right     Past Surgical History:  Procedure Laterality Date  . CHOLECYSTECTOMY OPEN  AGE 59  . INGUINAL HERNIA REPAIR Right AGE 59s  . KNEE ARTHROSCOPY Left 2014  . LUMBAR DISC SURGERY  x2  LAST DATE EARLY 2000s  . ORIF ANKLE FRACTURE Right 06/24/2018   Procedure: OPEN REDUCTION INTERNAL FIXATION (ORIF) RIGHT ANKLE FRACTURE;  Surgeon: Kathryne HitchBlackman, Christopher Y, MD;  Location: [facility];  Service: Orthopedics;  Laterality: Right;  . ROTATOR CUFF REPAIR Left 01/2015  . TONSILLECTOMY  AGE 42  . VAGINAL HYSTERECTOMY  AGE 9   WITH BSO    Social History:  reports that she quit smoking about 29 years ago. She quit after  10.00 years of use. She has never used smokeless tobacco. She reports that she drinks about 3.0 - 4.0 standard drinks of alcohol per week. She reports that she does not use drugs.  Allergies: Allergies  Allergen Reactions  . Aspirin Itching  . Clonidine Hives    Family History:  Family History  Problem Relation Age of Onset  . Colon cancer Neg Hx   . Esophageal cancer Neg Hx   . Rectal cancer Neg Hx   . Stomach cancer Neg Hx      Current Outpatient Medications:  .  ADVAIR DISKUS 250-50 MCG/DOSE AEPB, INHALE 1 PUFF INTO THE LUNGS EVERY 12 (TWELVE) HOURS FOR SHORTNESS OF BREATH AND WHEEZING (Patient taking differently: Inhale 1 puff into the lungs every 12 (twelve) hours. ), Disp: 60 each, Rfl: 1 .  albuterol (PROVENTIL HFA;VENTOLIN HFA) 108 (90 Base) MCG/ACT inhaler, Inhale 2 puffs into the lungs every 6 (six) hours as needed for wheezing., Disp: 1 each, Rfl: 0 .  benazepril (LOTENSIN) 40 MG tablet, TAKE 1 TABLET (40 MG TOTAL) BY MOUTH DAILY., Disp: 90 tablet, Rfl: 0 .  celecoxib (CELEBREX) 200 MG capsule, TAKE 1 CAPSULE BY MOUTH EVERY DAY, Disp: 90 capsule, Rfl: 1 .  COLLAGEN PO, Take by mouth. Collagen powder-mix with coffee and drink daily, Disp: , Rfl:  .  fluticasone (  FLONASE) 50 MCG/ACT nasal spray, Place 2 sprays into both nostrils daily., Disp: 16 g, Rfl: 2 .  hydrochlorothiazide (HYDRODIURIL) 25 MG tablet, TAKE 1 TABLET BY MOUTH EVERY DAY, Disp: 90 tablet, Rfl: 3 .  Liniments (SALONPAS PAIN RELIEF PATCH EX), Apply 1 patch topically daily as needed (pain)., Disp: , Rfl:  .  Menthol, Topical Analgesic, (BLUE-EMU MAXIMUM STRENGTH EX), Apply 1 application topically daily., Disp: , Rfl:  .  traMADol (ULTRAM) 50 MG tablet, TAKE 1 TABLET BY MOUTH EVERY 6 HOURS, Disp: 90 tablet, Rfl: 0  Review of Systems:  Constitutional: Denies fever, chills, diaphoresis, appetite change and fatigue.  HEENT: Denies photophobia, eye pain, redness,  mouth sores, trouble swallowing, neck pain, neck  stiffness and has had mild tinnitus.   Respiratory: Denies SOB, DOE, cough, chest tightness,  and wheezing.   Cardiovascular: Denies chest pain, palpitations and leg swelling.  Gastrointestinal: Denies nausea, vomiting, abdominal pain, diarrhea, constipation, blood in stool and abdominal distention.  Genitourinary: Denies dysuria, urgency, frequency, hematuria, flank pain and difficulty urinating.  Endocrine: Denies: hot or cold intolerance, sweats, changes in hair or nails, polyuria, polydipsia. Musculoskeletal: Denies myalgias, back pain, joint swelling, arthralgias and gait problem.  Skin: Denies pallor, rash and wound.  Neurological: Denies  seizures, syncope, weakness,  numbness and headaches.  Hematological: Denies adenopathy. Easy bruising, personal or family bleeding history  Psychiatric/Behavioral: Denies suicidal ideation, mood changes, confusion, nervousness, sleep disturbance and agitation    Physical Exam: Vitals:   09/08/18 0736  BP: 120/74  Pulse: 81  Temp: 98 F (36.7 C)  TempSrc: Oral  SpO2: 97%  Weight: 161 lb 11.2 oz (73.3 kg)    Body mass index is 31.58 kg/m.   Constitutional: NAD, calm, comfortable Eyes: PERRL, lids and conjunctivae normal ENMT: Mucous membranes are moist. Posterior pharynx clear of any exudate or lesions. Normal dentition. Tympanic membrane is pearly white, no erythema or bulging. Neck: normal, supple, no masses, no thyromegaly Respiratory: clear to auscultation bilaterally, no wheezing, no crackles. Normal respiratory effort. No accessory muscle use.  Cardiovascular: Regular rate and rhythm, no murmurs / rubs / gallops. No extremity edema. 2+ pedal pulses. No carotid bruits.  Abdomen: no tenderness, no masses palpated. No hepatosplenomegaly. Bowel sounds positive.  Musculoskeletal: no clubbing / cyanosis. No joint deformity upper and lower extremities. Good ROM, no contractures. Normal muscle tone.  Skin: no rashes, lesions, ulcers. No  induration Neurologic: CN 2-12 grossly intact. Sensation intact, DTR normal. Strength 5/5 in all 4.  Psychiatric: Normal judgment and insight. Alert and oriented x 3. Normal mood.    Impression and Plan:  Dizziness -Based on history and physical exam today, I believe she likely has mild acute labyrinthitis.  This would correlate with her recent URI symptoms. -Of note she is not orthostatic on today's visit, heart rate is regular. -She has been told that she may use some over-the-counter medications for nasal decongestion and sinus pain relief. -Has been advised that this should resolve spontaneously over the next 4 to 6 weeks.  I have attached some information for her review.  Essential hypertension -Well-controlled on today's visit. -On benazepril 40, HCTZ 25  Mild intermittent asthma, unspecified whether complicated -Well-controlled. -On advair, Prn albuterol  Closed trimalleolar fracture of right ankle with routine healing, subsequent encounter -Released from ortho care, advised to follow up PRN.  Gastroesophageal reflux disease without esophagitis -No symptoms, not on meds.   Due for annual wellness exam on or after March 2020.  At that time would  like the following labs to be drawn: CBC with differential, comprehensive metabolic profile, fasting lipid profile, TSH, vitamin D.   Patient Instructions  -Please schedule your annual medical wellness exam on or after March 2020 with our wellness coach.  Please come fasting to this visit as we will draw labs.  -Follow-up with me in 6 months for chronic medical issues.  -Based on your visit today I believe you likely have acute labyrinthitis which is causing your dizziness.  This usually occurs after an upper respiratory infection and can last 4 to 6 weeks.  You may use over-the-counter medications for nasal decongestion and sinus pain relief.  Please come back to see [place] if no improvement in 6 weeks time.  -I have attached some  information for your review.     Chaya Jan, MD [place] [facility]

## 2018-09-08 ENCOUNTER — Ambulatory Visit (INDEPENDENT_AMBULATORY_CARE_PROVIDER_SITE_OTHER): Payer: Medicare Other | Admitting: Internal Medicine

## 2018-09-08 ENCOUNTER — Encounter: Payer: Self-pay | Admitting: Internal Medicine

## 2018-09-08 VITALS — BP 120/74 | HR 81 | Temp 98.0°F | Wt 161.7 lb

## 2018-09-08 DIAGNOSIS — H8309 Labyrinthitis, unspecified ear: Secondary | ICD-10-CM | POA: Diagnosis not present

## 2018-09-08 DIAGNOSIS — I10 Essential (primary) hypertension: Secondary | ICD-10-CM | POA: Diagnosis not present

## 2018-09-08 DIAGNOSIS — J4520 Mild intermittent asthma, uncomplicated: Secondary | ICD-10-CM

## 2018-09-08 DIAGNOSIS — Z23 Encounter for immunization: Secondary | ICD-10-CM

## 2018-09-08 DIAGNOSIS — K219 Gastro-esophageal reflux disease without esophagitis: Secondary | ICD-10-CM | POA: Diagnosis not present

## 2018-09-08 DIAGNOSIS — S82851D Displaced trimalleolar fracture of right lower leg, subsequent encounter for closed fracture with routine healing: Secondary | ICD-10-CM

## 2018-09-08 NOTE — Patient Instructions (Addendum)
-  Please schedule your annual medical wellness exam on or after March 2020 with our wellness coach.  Please come fasting to this visit as we will draw labs.  -Follow-up with me in 6 months for chronic medical issues.  -Based on your visit today I believe you likely have acute labyrinthitis which is causing your dizziness.  This usually occurs after an upper respiratory infection and can last 4 to 6 weeks.  You may use over-the-counter medications for nasal decongestion and sinus pain relief.  Please come back to see us if no improvement in 6 weeks time.  -I have attached some information for your review.

## 2018-09-09 ENCOUNTER — Ambulatory Visit: Payer: Medicare Other | Admitting: Physical Therapy

## 2018-09-09 ENCOUNTER — Encounter: Payer: Self-pay | Admitting: Physical Therapy

## 2018-09-09 DIAGNOSIS — R42 Dizziness and giddiness: Secondary | ICD-10-CM | POA: Diagnosis not present

## 2018-09-09 DIAGNOSIS — M25671 Stiffness of right ankle, not elsewhere classified: Secondary | ICD-10-CM

## 2018-09-09 DIAGNOSIS — M6281 Muscle weakness (generalized): Secondary | ICD-10-CM

## 2018-09-09 DIAGNOSIS — M25571 Pain in right ankle and joints of right foot: Secondary | ICD-10-CM | POA: Diagnosis not present

## 2018-09-09 DIAGNOSIS — R2681 Unsteadiness on feet: Secondary | ICD-10-CM

## 2018-09-09 DIAGNOSIS — R262 Difficulty in walking, not elsewhere classified: Secondary | ICD-10-CM

## 2018-09-09 NOTE — Therapy (Signed)
[facility] [address]  [place], [name], [address] Phone: [phone]   Fax:  [phone]  Physical Therapy Treatment  Patient Details  Name: Kristy [name] MRN: [id number] Date of Birth: 1941/07/15 Referring Provider (PT): Jean Rosenthal, MD   Encounter Date: 09/09/2018  PT End of Session - 09/09/18 1106    Visit Number  6    Number of Visits  13    Date for PT Re-Evaluation  09/28/18    Authorization Type  Medicare and BCBS    PT Start Time  1022   patient late   PT Stop Time  1102    PT Time Calculation (min)  40 min    Activity Tolerance  Patient tolerated treatment well   limited by dizziness   Behavior During Therapy  [facility] for tasks assessed/performed       Past Medical History:  Diagnosis Date  . Allergic rhinitis, seasonal   . Arthritis   . Asthma   . GERD (gastroesophageal reflux disease)   . History of colon polyps   . Hypertension   . Rotator cuff tear, right     Past Surgical History:  Procedure Laterality Date  . CHOLECYSTECTOMY OPEN  AGE 77  . INGUINAL HERNIA REPAIR Right AGE 77s  . KNEE ARTHROSCOPY Left 2014  . LUMBAR [place] SURGERY  x2  LAST DATE EARLY 2000s  . ORIF ANKLE FRACTURE Right 06/24/2018   Procedure: OPEN REDUCTION INTERNAL FIXATION (ORIF) RIGHT ANKLE FRACTURE;  Surgeon: Mcarthur Rossetti, MD;  Location: [facility];  Service: Orthopedics;  Laterality: Right;  . ROTATOR CUFF REPAIR Left 01/2015  . TONSILLECTOMY  AGE 77  . VAGINAL HYSTERECTOMY  AGE 77   WITH BSO    There were no vitals filed for this visit.  Subjective Assessment - 09/09/18 1024    Subjective  Patient reports she has been okay. Has been dizzy and MDs told her she had labyrinthitis. Reports when she goes from supine to sit and turning head. Dizziness comes in waves.     Pertinent History  R RTC tear, HTN, GERD, asthma, L RTC repair, lumbar disc surgery    Diagnostic tests  08/04/18 R ankle xray: 3  views right ankle shows a talus well located within the ankle mortise no signs of diastases. Medial lateral hardware is without any signs of failure. Fractures were reduced anatomically with good  further consolidation. No other fractures identified    Patient Stated Goals  "get back to my life"    Currently in Pain?  No/denies                       [facility] Adult PT Treatment/Exercise - 09/09/18 0001      Manual Therapy   Manual Therapy  Taping    Kinesiotex  Edema      Kinesiotix   Edema  R lateral ankle weaving pattern for edema      Ankle Exercises: Aerobic   Nustep  L3 x 63mn LEs      Ankle Exercises: Seated   BAPS  Sitting;Level 2;10 reps    BAPS Limitations  20x DF/PF; 20x R/L; difficulty isolating movements       Ankle Exercises: Standing   Other Standing Ankle Exercises  R LE anterior step up with 4,6,8" step with 1 UR support on counter top 5x each step      Vestibular Treatment/Exercise - 09/09/18 0001  Vestibular Treatment/Exercise   Habituation Exercises  Seated Diagonal Head Turns;Seated Vertical Head Turns      Seated Vertical Head Turns   Number of Reps   10    Symptom Description   2x10; difficulty maintaining gaze and report of dizziness      Seated Diagonal Head Turns   Number of Reps  10    Symptoms Description   2x10; only mild dizziness noted            PT Education - 09/09/18 1105    Education Details  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    Time  3    Period  Weeks    Status   Achieved        PT Long Term Goals - 08/31/18 1106      PT LONG TERM GOAL #1   Title  Patient to be independent with advanced HEP.    Time  6    Period  Weeks    Status  Partially Met      PT LONG TERM GOAL #2   Title  Patient to demonstrate [facility] and pain-free R ankle AROM/PROM.    Time  6    Period  Weeks    Status  Partially Met      PT LONG TERM GOAL #3   Title  Patient to demonstrate >=4+/5 strength in B LEs.     Time  6    Period  Weeks    Status  Partially Met      PT LONG TERM GOAL #4   Title  Patient to demonstrate R SLS for 10 sec without LOB.    Time  6    Period  Weeks    Status  On-going      PT LONG TERM GOAL #5   Title  Patient to report tolerance of 1 hour of walking without pain limiting.     Time  6    Period  Weeks    Status  On-going      PT LONG TERM GOAL #6   Title  Patient to return to bowling without pain limiting.     Time  6    Period  Weeks    Status  On-going            Plan - 09/09/18 1158    Clinical Impression Statement  Patient arrived to session late, reporting that she has been dizzy lately. Reports that she went to MD who told her she had labyrinthitis. Has increased dizziness with supine to sit transfers and quickly turning head back and forth. Upon examination of VOR, patient demonstrating trouble with gaze stabilization with horizontal VOR as well as c/o dizziness. Updated HEP with seated vertical and horizontal VOR to tolerance for habituation. Patient's dizziness, combined with imbalance and decreased proprioception from prior R ankle surgery puts her at an increased risk of falls and it is important to address all of these factors. 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 Patient received tape to R lateral ankle at end of session for edema. Educated on wear time, precautions, and removal of KT tape. Also advised patient to avoid wearing high heeled shoes d/t dizziness and fall risk. Patient  reported understanding.     Clinical Impairments Affecting Rehab Potential  R RTC tear, HTN, GERD, asthma, L RTC repair, lumbar disc surgery    PT Treatment/Interventions  ADLs/Self Care Home Management;Cryotherapy;Electrical Stimulation;Moist Heat;Ultrasound;DME Instruction;Gait training;Stair training;Functional mobility training;Therapeutic activities;Therapeutic exercise;Manual techniques;Orthotic Fit/Training;Patient/family education;Neuromuscular re-education;Balance training;Scar mobilization;Passive range of motion;Dry needling;Energy conservation;Splinting;Taping;Vasopneumatic Device    PT Next Visit Plan  assess response to vestibular HEP; assess response to tape    Consulted and Agree with Plan of Care  Patient       Patient will benefit from skilled therapeutic intervention in order to improve the following deficits and impairments:  Decreased endurance, Hypomobility, Increased edema, Decreased scar mobility, Decreased activity tolerance, Decreased strength, Pain, Difficulty walking, Decreased mobility, Decreased balance, Decreased range of motion, Impaired flexibility, Improper body mechanics  Visit Diagnosis: Pain in right ankle and joints of right foot  Stiffness of right ankle, not elsewhere classified  Difficulty in walking, not elsewhere classified  Muscle weakness (generalized)  Dizziness and giddiness  Unsteadiness on feet     Problem List Patient Active Problem List   Diagnosis Date Noted  . Acute labyrinthitis, unspecified laterality 09/08/2018  . Trimalleolar fracture of right ankle 06/24/2018  . Closed trimalleolar fracture of right ankle 06/21/2018  . Osteoarthritis 09/22/2012  . UNSPECIFIED DISORDER TEETH&SUPPORTING STRUCTURES 09/09/2010  . Essential hypertension 04/07/2007  . Allergic rhinitis 04/07/2007  . Asthma 04/07/2007  . GERD 04/07/2007  . COLONIC POLYPS, HX OF 04/07/2007    Janene Harvey, PT, DPT 09/09/18 12:03 PM   [facility] [address]  [place], [name], [address] Phone: [phone]   Fax:  [phone]  Name: CORNELL GABER MRN: [id number] Date of Birth: Apr 25, 1941

## 2018-09-12 ENCOUNTER — Ambulatory Visit: Payer: Medicare Other | Admitting: Physical Therapy

## 2018-09-12 ENCOUNTER — Encounter: Payer: Self-pay | Admitting: Physical Therapy

## 2018-09-12 DIAGNOSIS — M6281 Muscle weakness (generalized): Secondary | ICD-10-CM

## 2018-09-12 DIAGNOSIS — M25671 Stiffness of right ankle, not elsewhere classified: Secondary | ICD-10-CM | POA: Diagnosis not present

## 2018-09-12 DIAGNOSIS — R2681 Unsteadiness on feet: Secondary | ICD-10-CM

## 2018-09-12 DIAGNOSIS — M25571 Pain in right ankle and joints of right foot: Secondary | ICD-10-CM

## 2018-09-12 DIAGNOSIS — R42 Dizziness and giddiness: Secondary | ICD-10-CM

## 2018-09-12 DIAGNOSIS — R262 Difficulty in walking, not elsewhere classified: Secondary | ICD-10-CM | POA: Diagnosis not present

## 2018-09-12 NOTE — Therapy (Signed)
[facility] [address]  [place], [name], [address] Phone: [phone]   Fax:  [phone]  Physical Therapy Treatment  Patient Details  Name: Kristy [name] MRN: [id number] Date of Birth: 1940/10/31 Referring Provider (PT): Jean Rosenthal, MD   Encounter Date: 09/12/2018  PT End of Session - 09/12/18 1147    Visit Number  7    Number of Visits  13    Date for PT Re-Evaluation  09/28/18    Authorization Type  Medicare and BCBS    PT Start Time  1100    PT Stop Time  1150    PT Time Calculation (min)  50 min    Activity Tolerance  Patient tolerated treatment well    Behavior During Therapy  [facility] for tasks assessed/performed       Past Medical History:  Diagnosis Date  . Allergic rhinitis, seasonal   . Arthritis   . Asthma   . GERD (gastroesophageal reflux disease)   . History of colon polyps   . Hypertension   . Rotator cuff tear, right     Past Surgical History:  Procedure Laterality Date  . CHOLECYSTECTOMY OPEN  AGE 89  . INGUINAL HERNIA REPAIR Right AGE 1s  . KNEE ARTHROSCOPY Left 2014  . LUMBAR [place] SURGERY  x2  LAST DATE EARLY 2000s  . ORIF ANKLE FRACTURE Right 06/24/2018   Procedure: OPEN REDUCTION INTERNAL FIXATION (ORIF) RIGHT ANKLE FRACTURE;  Surgeon: Mcarthur Rossetti, MD;  Location: [facility];  Service: Orthopedics;  Laterality: Right;  . ROTATOR CUFF REPAIR Left 01/2015  . TONSILLECTOMY  AGE 51  . VAGINAL HYSTERECTOMY  AGE 35   WITH BSO    There were no vitals filed for this visit.  Subjective Assessment - 09/12/18 1101    Subjective  Reports she is doing pretty well. Reports the dizziness is a little better. Had a lot of pain in R ankle on Saturday because it rained. Reports swelling is not as bad; notes benefit from taping. Has her first bowling competition today.     Pertinent History  R RTC tear, HTN, GERD, asthma, L RTC repair, lumbar disc surgery    Diagnostic tests   08/04/18 R ankle xray: 3 views right ankle shows a talus well located within the ankle mortise no signs of diastases. Medial lateral hardware is without any signs of failure. Fractures were reduced anatomically with good  further consolidation. No other fractures identified    Patient Stated Goals  "get back to my life"    Currently in Pain?  Yes    Pain Score  3     Pain Location  Ankle    Pain Orientation  Right;Medial    Pain Descriptors / Indicators  Sharp    Pain Type  Acute pain                       [facility] Adult PT Treatment/Exercise - 09/12/18 0001      Vasopneumatic   Number Minutes Vasopneumatic   10 minutes    Vasopnuematic Location   Ankle    Vasopneumatic Pressure  Medium    Vasopneumatic Temperature   coldest      Manual Therapy   Manual Therapy  Taping    Kinesiotex  Edema      Kinesiotix   Edema  R lateral ankle weaving pattern for edema      Ankle Exercises: Aerobic   Nustep  L3 x 25mn LEs      Ankle Exercises: Standing   Heel Raises  Both;10 reps;Limitations   cues for higher heel raise   Other Standing Ankle Exercises  B eccentric heel raise at UBE 2x10    Other Standing Ankle Exercises  R LE 3 way DF stretch on step with manual assistance to maintain heel down 5x3" each way      Ankle Exercises: Stretches   Gastroc Stretch  2 reps;30 seconds;Limitations    Gastroc Stretch Limitations  at wall; R LE      Vestibular Treatment/Exercise - 09/12/18 0001      Vestibular Treatment/Exercise   Habituation Exercises  Seated Horizontal Head Turns;Seated Vertical Head Turns;Standing Horizontal Head Turns;Standing Vertical Head Turns      Seated Vertical Head Turns   Number of Reps   10    Symptom Description   10x   cues for increased cervical extension     Seated Diagonal Head Turns   Number of Reps  10    Symptoms Description   considerable difficulty maintaining gaze in both directions' improved on 2nd set      Standing Horizontal Head  Turns   Number of Reps   10   slow speed and segmented movement   Symptom Description   with UE support on chair; c/o mild ringing in R ear      Standing Vertical Head Turns   Number of Reps   10    Symptom Description   with UE support on chair; no dizziness            PT Education - 09/12/18 1146    Education Details  update to HEP    Person(s) Educated  Patient    Methods  Explanation;Demonstration;Tactile cues;Verbal cues;Handout    Comprehension  Returned demonstration;Verbalized understanding       PT Short Term Goals - 08/31/18 1155      PT SHORT TERM GOAL #1   Title  Patient to be independent with initial HEP.    Time  3    Period  Weeks    Status  Achieved        PT Long Term Goals - 08/31/18 1106      PT LONG TERM GOAL #1   Title  Patient to be independent with advanced HEP.    Time  6    Period  Weeks    Status  Partially Met      PT LONG TERM GOAL #2   Title  [facility] to demonstrate [facility] AROM/PROM.    Time  6    Period  Weeks    Status  Partially Met      PT LONG TERM GOAL #3   Title  Patient to demonstrate >=4+/5 strength in B LEs.     Time  6    Period  Weeks    Status  Partially Met      PT LONG TERM GOAL #4   Title  Patient to demonstrate R SLS for 10 sec without LOB.    Time  6    Period  Weeks    Status  On-going      PT LONG TERM GOAL #5   Title  Patient to report tolerance of 1 hour of walking without pain limiting.     Time  6    Period  Weeks    Status  On-going      PT LONG TERM GOAL #  6   Title  Patient to return to bowling without pain limiting.     Time  6    Period  Weeks    Status  On-going            Plan - 09/12/18 1147    Clinical Impression Statement  Patient arrived to session with report of improvement in level of dizziness and stability since last session. Also notes improvement in R ankle edema from use of tape. Patient ambulating into clinic with tennis shoes today as instructed.  Worked on sitting VOR for gaze stabilization- patient initially with considerable difficulty maintaining gaze stability to both R & L directions, however improved with increased repetitions. Patient reporting HA during VOR but able to continue-likely d/t cervical and shoulder muscle tightening during this activity. Moved patient to dimly lit treatment room for comfort with better tolerance. Able to progress to standing VOR today and advised patient to perform in standing at home. Administered HEP handout- patient reported understanding. Worked on eccentric heel raises and self-DF stretch on step with good tolerance. Received taping to R ankle for edema. Patient still with visible edema to R lateral ankle which was further addressed with Gameready. Normal integumentary response observed at end of session.     Clinical Impairments Affecting Rehab Potential  R RTC tear, HTN, GERD, asthma, L RTC repair, lumbar disc surgery    PT Treatment/Interventions  ADLs/Self Care Home Management;Cryotherapy;Electrical Stimulation;Moist Heat;Ultrasound;DME Instruction;Gait training;Stair training;Functional mobility training;Therapeutic activities;Therapeutic exercise;Manual techniques;Orthotic Fit/Training;Patient/family education;Neuromuscular re-education;Balance training;Scar mobilization;Passive range of motion;Dry needling;Energy conservation;Splinting;Taping;Vasopneumatic Device    PT Next Visit Plan  progress gaze stabilization and balance training    Consulted and Agree with Plan of Care  Patient       Patient will benefit from skilled therapeutic intervention in order to improve the following deficits and impairments:  Decreased endurance, Hypomobility, Increased edema, Decreased scar mobility, Decreased activity tolerance, Decreased strength, Pain, Difficulty walking, Decreased mobility, Decreased balance, Decreased range of motion, Impaired flexibility, Improper body mechanics  Visit Diagnosis: Pain in right  ankle and joints of right foot  Stiffness of right ankle, not elsewhere classified  Difficulty in walking, not elsewhere classified  Muscle weakness (generalized)  Dizziness and giddiness  Unsteadiness on feet     Problem List Patient Active Problem List   Diagnosis Date Noted  . Acute labyrinthitis, unspecified laterality 09/08/2018  . Trimalleolar fracture of right ankle 06/24/2018  . Closed trimalleolar fracture of right ankle 06/21/2018  . Osteoarthritis 09/22/2012  . UNSPECIFIED DISORDER TEETH&SUPPORTING STRUCTURES 09/09/2010  . Essential hypertension 04/07/2007  . Allergic rhinitis 04/07/2007  . Asthma 04/07/2007  . GERD 04/07/2007  . COLONIC POLYPS, HX OF 04/07/2007    Janene Harvey, PT, DPT 09/12/18 12:02 PM   [facility] [address]  [place], [name], [address] Phone: [phone]   Fax:  [phone]  Name: TRUDE CANSLER MRN: [id number] Date of Birth: 21-Jun-1941

## 2018-09-14 ENCOUNTER — Encounter: Payer: Self-pay | Admitting: Physical Therapy

## 2018-09-14 ENCOUNTER — Ambulatory Visit: Payer: Medicare Other | Admitting: Physical Therapy

## 2018-09-14 DIAGNOSIS — R2681 Unsteadiness on feet: Secondary | ICD-10-CM

## 2018-09-14 DIAGNOSIS — R262 Difficulty in walking, not elsewhere classified: Secondary | ICD-10-CM

## 2018-09-14 DIAGNOSIS — M6281 Muscle weakness (generalized): Secondary | ICD-10-CM | POA: Diagnosis not present

## 2018-09-14 DIAGNOSIS — M25671 Stiffness of right ankle, not elsewhere classified: Secondary | ICD-10-CM | POA: Diagnosis not present

## 2018-09-14 DIAGNOSIS — M25571 Pain in right ankle and joints of right foot: Secondary | ICD-10-CM

## 2018-09-14 DIAGNOSIS — R42 Dizziness and giddiness: Secondary | ICD-10-CM | POA: Diagnosis not present

## 2018-09-14 NOTE — Therapy (Signed)
[facility] [address]  [place], [name], [address] Phone: [phone]   Fax:  [phone]  Physical Therapy Treatment  Patient Details  Name: Kristy [name] MRN: [id number] Date of Birth: Dec 16, 1940 Referring Provider (PT): Jean Rosenthal, MD   Encounter Date: 09/14/2018  PT End of Session - 09/14/18 1103    Visit Number  8    Number of Visits  13    Date for PT Re-Evaluation  09/28/18    Authorization Type  Medicare and BCBS    PT Start Time  1017    PT Stop Time  1110    PT Time Calculation (min)  53 min    Activity Tolerance  Patient tolerated treatment well    Behavior During Therapy  [facility] for tasks assessed/performed       Past Medical History:  Diagnosis Date  . Allergic rhinitis, seasonal   . Arthritis   . Asthma   . GERD (gastroesophageal reflux disease)   . History of colon polyps   . Hypertension   . Rotator cuff tear, right     Past Surgical History:  Procedure Laterality Date  . CHOLECYSTECTOMY OPEN  AGE 77  . INGUINAL HERNIA REPAIR Right AGE 77  . KNEE ARTHROSCOPY Left 2014  . LUMBAR [place] SURGERY  x2  LAST DATE EARLY 2000s  . ORIF ANKLE FRACTURE Right 06/24/2018   Procedure: OPEN REDUCTION INTERNAL FIXATION (ORIF) RIGHT ANKLE FRACTURE;  Surgeon: Mcarthur Rossetti, MD;  Location: [facility];  Service: Orthopedics;  Laterality: Right;  . ROTATOR CUFF REPAIR Left 01/2015  . TONSILLECTOMY  AGE 77  . VAGINAL HYSTERECTOMY  AGE 77   WITH BSO    There were no vitals filed for this visit.  Subjective Assessment - 09/14/18 1018    Subjective  Report she went bowling on Monday and was having pain in R medial ankle and still having last pain today. Was still able to take a wlak with her dog today. She had 1 episode of a wave of dizziness yesterday while she was walking dog and turning around.     Pertinent History  R RTC tear, HTN, GERD, asthma, L RTC repair, lumbar disc surgery     Diagnostic tests  08/04/18 R ankle xray: 3 views right ankle shows a talus well located within the ankle mortise no signs of diastases. Medial lateral hardware is without any signs of failure. Fractures were reduced anatomically with good  further consolidation. No other fractures identified    Patient Stated Goals  "get back to my life"    Currently in Pain?  Yes    Pain Score  2     Pain Location  Ankle    Pain Orientation  Right;Medial    Pain Descriptors / Indicators  Sharp    Pain Type  Acute pain                       [facility] Adult PT Treatment/Exercise - 09/14/18 0001      Exercises   Exercises  --      Knee/Hip Exercises: Machines for Strengthening   Total Gym Leg Press  B LE calf raise at 5# and 10# x10   patient with difficulty     Knee/Hip Exercises: Standing   Functional Squat  1 set;15 reps;Limitations    Functional Squat Limitations  at counter to to tolerance; red TB around knees    SLS  R SLS on foam + paloff press with red TB 10x each side   toe touch on L LE   Other Standing Knee Exercises  R LE SLS with 1 UE support fort balance x4 min   unable to tolerate      Vasopneumatic   Number Minutes Vasopneumatic   10 minutes    Vasopnuematic Location   Ankle   R   Vasopneumatic Pressure  Medium    Vasopneumatic Temperature   coldest      Ankle Exercises: Aerobic   Nustep  L3 x 19mn LEs      Ankle Exercises: Standing   Heel Raises  Both;10 reps;Limitations   eccentric heel raises at UBE   Other Standing Ankle Exercises  R LE 3 way DF stretch on step with manual assistance to maintain heel down 5x3" each way             PT Education - 09/14/18 1102    Education Details  update and consolidation of HEP; administered red TB    Person(s) Educated  Patient    Methods  Explanation;Demonstration;Tactile cues;Verbal cues;Handout    Comprehension  Verbalized understanding;Returned demonstration       PT Short Term Goals - 08/31/18 1155       PT SHORT TERM GOAL #1   Title  Patient to be independent with initial HEP.    Time  3    Period  Weeks    Status  Achieved        PT Long Term Goals - 08/31/18 1106      PT LONG TERM GOAL #1   Title  Patient to be independent with advanced HEP.    Time  6    Period  Weeks    Status  Partially Met      PT LONG TERM GOAL #2   Title  Patient to demonstrate [facility] pain-free R ankle AROM/PROM.    Time  6    Period  Weeks    Status  Partially Met      PT LONG TERM GOAL #3   Title  Patient to demonstrate >=4+/5 strength in B LEs.     Time  6    Period  Weeks    Status  Partially Met      PT LONG TERM GOAL #4   Title  Patient to demonstrate R SLS for 10 sec without LOB.    Time  6    Period  Weeks    Status  On-going      PT LONG TERM GOAL #5   Title  Patient to report tolerance of 1 hour of walking without pain limiting.     Time  6    Period  Weeks    Status  On-going      PT LONG TERM GOAL #6   Title  Patient to return to bowling without pain limiting.     Time  6    Period  Weeks    Status  On-going            Plan - 09/14/18 1104    Clinical Impression Statement  Patient arrived to session with report that she went bowling on Monday and was able to tolerate doing it, but had lasting pain in R medial ankle lasting until today. Worked on increasing R ankle DF ROM and maintaining R SLS today. Patient with good performance of squats with banded resistance around knees, requiring minimal cues for form. Patient  with mild difficulty maintaining R SLS with intermittent 2 finger support- d/t balance difficulty could not tolerate added dynamic challenge of throwing ball back and forth. Progressed heel raises with eccentric heel raise today and added to HEP. Reviewed and consolidated HEP with most up to date exercises and advised patient to use UE support for safety. Patient reported understanding. Ended session with Gameready to R ankle as patient still with considerable  edema to R ankle. Normal integumentary response observed and patient without complaints.     Clinical Impairments Affecting Rehab Potential  R RTC tear, HTN, GERD, asthma, L RTC repair, lumbar disc surgery    PT Treatment/Interventions  ADLs/Self Care Home Management;Cryotherapy;Electrical Stimulation;Moist Heat;Ultrasound;DME Instruction;Gait training;Stair training;Functional mobility training;Therapeutic activities;Therapeutic exercise;Manual techniques;Orthotic Fit/Training;Patient/family education;Neuromuscular re-education;Balance training;Scar mobilization;Passive range of motion;Dry needling;Energy conservation;Splinting;Taping;Vasopneumatic Device    PT Next Visit Plan  progress gaze stabilization, ankle ROM and strengthening    Consulted and Agree with Plan of Care  Patient       Patient will benefit from skilled therapeutic intervention in order to improve the following deficits and impairments:  Decreased endurance, Hypomobility, Increased edema, Decreased scar mobility, Decreased activity tolerance, Decreased strength, Pain, Difficulty walking, Decreased mobility, Decreased balance, Decreased range of motion, Impaired flexibility, Improper body mechanics  Visit Diagnosis: Pain in right ankle and joints of right foot  Stiffness of right ankle, not elsewhere classified  Difficulty in walking, not elsewhere classified  Muscle weakness (generalized)  Dizziness and giddiness  Unsteadiness on feet     Problem List Patient Active Problem List   Diagnosis Date Noted  . Acute labyrinthitis, unspecified laterality 09/08/2018  . Trimalleolar fracture of right ankle 06/24/2018  . Closed trimalleolar fracture of right ankle 06/21/2018  . Osteoarthritis 09/22/2012  . UNSPECIFIED DISORDER TEETH&SUPPORTING STRUCTURES 09/09/2010  . Essential hypertension 04/07/2007  . Allergic rhinitis 04/07/2007  . Asthma 04/07/2007  . GERD 04/07/2007  . COLONIC POLYPS, HX OF 04/07/2007      Janene Harvey, PT, DPT 09/14/18 11:18 AM   [facility] [address]  [place], [name], [address] Phone: [phone]   Fax:  [phone]  Name: ROSSANA MOLCHAN MRN: [id number] Date of Birth: [birth year]

## 2018-09-20 DIAGNOSIS — E2839 Other primary ovarian failure: Secondary | ICD-10-CM | POA: Diagnosis not present

## 2018-09-20 DIAGNOSIS — S82891A Other fracture of right lower leg, initial encounter for closed fracture: Secondary | ICD-10-CM | POA: Diagnosis not present

## 2018-09-20 DIAGNOSIS — Z13820 Encounter for screening for osteoporosis: Secondary | ICD-10-CM | POA: Diagnosis not present

## 2018-09-20 DIAGNOSIS — Z780 Asymptomatic menopausal state: Secondary | ICD-10-CM | POA: Diagnosis not present

## 2018-09-20 LAB — HM DEXA SCAN: HM Dexa Scan: NORMAL

## 2018-09-21 ENCOUNTER — Ambulatory Visit: Payer: Medicare Other

## 2018-09-26 ENCOUNTER — Ambulatory Visit: Payer: Medicare Other | Attending: Orthopaedic Surgery | Admitting: Physical Therapy

## 2018-09-26 ENCOUNTER — Encounter: Payer: Self-pay | Admitting: Physical Therapy

## 2018-09-26 DIAGNOSIS — M6281 Muscle weakness (generalized): Secondary | ICD-10-CM | POA: Insufficient documentation

## 2018-09-26 DIAGNOSIS — Z803 Family history of malignant neoplasm of breast: Secondary | ICD-10-CM | POA: Diagnosis not present

## 2018-09-26 DIAGNOSIS — M25671 Stiffness of right ankle, not elsewhere classified: Secondary | ICD-10-CM | POA: Insufficient documentation

## 2018-09-26 DIAGNOSIS — R2681 Unsteadiness on feet: Secondary | ICD-10-CM | POA: Diagnosis not present

## 2018-09-26 DIAGNOSIS — Z1231 Encounter for screening mammogram for malignant neoplasm of breast: Secondary | ICD-10-CM | POA: Diagnosis not present

## 2018-09-26 DIAGNOSIS — R42 Dizziness and giddiness: Secondary | ICD-10-CM

## 2018-09-26 DIAGNOSIS — R262 Difficulty in walking, not elsewhere classified: Secondary | ICD-10-CM | POA: Diagnosis not present

## 2018-09-26 DIAGNOSIS — M25571 Pain in right ankle and joints of right foot: Secondary | ICD-10-CM

## 2018-09-26 LAB — HM MAMMOGRAPHY

## 2018-09-26 NOTE — Therapy (Signed)
[facility] [address]  [place], [name], [address] Phone: [phone]   Fax:  [phone]  Physical Therapy Discharge Summary  Patient Details  Name: Kristy [name] MRN: [id number] Date of Birth: 11-27-1940 Referring Provider (PT): Jean Rosenthal, MD   Progress Note Reporting Period 08/17/18 to 09/26/18  See note below for Objective Data and Assessment of Progress/Goals.    Encounter Date: 09/26/2018  PT End of Session - 09/26/18 1156    Visit Number  9    Number of Visits  13    Date for PT Re-Evaluation  09/28/18    Authorization Type  Medicare and BCBS    PT Start Time  1109    PT Stop Time  1142    PT Time Calculation (min)  33 min    Activity Tolerance  Patient tolerated treatment well    Behavior During Therapy  WFL for tasks assessed/performed       Past Medical History:  Diagnosis Date  . Allergic rhinitis, seasonal   . Arthritis   . Asthma   . GERD (gastroesophageal reflux disease)   . History of colon polyps   . Hypertension   . Rotator cuff tear, right     Past Surgical History:  Procedure Laterality Date  . CHOLECYSTECTOMY OPEN  AGE 77  . INGUINAL HERNIA REPAIR Right AGE 77  . KNEE ARTHROSCOPY Left 2014  . LUMBAR [place] SURGERY  x2  LAST DATE EARLY 2000s  . ORIF ANKLE FRACTURE Right 06/24/2018   Procedure: OPEN REDUCTION INTERNAL FIXATION (ORIF) RIGHT ANKLE FRACTURE;  Surgeon: Mcarthur Rossetti, MD;  Location: [facility];  Service: Orthopedics;  Laterality: Right;  . ROTATOR CUFF REPAIR Left 01/2015  . TONSILLECTOMY  AGE 77  . VAGINAL HYSTERECTOMY  AGE 77   WITH BSO    There were no vitals filed for this visit.  Subjective Assessment - 09/26/18 1105    Subjective  Patient arrived late with report that her mother passed away recently and is having to deal with the proceedings. Went bowling last week and had some soreness but able to manage pain with ice.     Pertinent History   R RTC tear, HTN, GERD, asthma, L RTC repair, lumbar disc surgery    Diagnostic tests  08/04/18 R ankle xray: 3 views right ankle shows a talus well located within the ankle mortise no signs of diastases. Medial lateral hardware is without any signs of failure. Fractures were reduced anatomically with good  further consolidation. No other fractures identified    Patient Stated Goals  "get back to my life"    Currently in Pain?  Yes    Pain Score  2     Pain Location  Ankle    Pain Orientation  Right;Medial    Pain Descriptors / Indicators  Sharp    Pain Type  Acute pain         [facility] PT Assessment - 09/26/18 0001      AROM   AROM Assessment Site  Ankle    Right/Left Ankle  Right    Right Ankle Dorsiflexion  10    Right Ankle Plantar Flexion  60    Right Ankle Inversion  28    Right Ankle Eversion  40      PROM   PROM Assessment Site  Ankle    Right/Left Ankle  Right    Right Ankle Dorsiflexion  10    Right Ankle  Plantar Flexion  63    Right Ankle Inversion  32    Right Ankle Eversion  43   mild pain in medial ankle     Strength   Strength Assessment Site  Hip;Knee;Ankle    Right/Left Hip  Right;Left    Right Hip Flexion  4+/5    Right Hip ABduction  4+/5    Right Hip ADduction  4/5    Left Hip Flexion  4+/5    Left Hip ABduction  4+/5    Left Hip ADduction  4/5    Right/Left Knee  Right;Left    Right Knee Flexion  4+/5    Right Knee Extension  4+/5    Left Knee Flexion  4+/5    Left Knee Extension  4+/5    Right/Left Ankle  Right;Left    Right Ankle Dorsiflexion  4+/5    Right Ankle Plantar Flexion  4+/5    Right Ankle Inversion  4/5   severe pain in medial ankle   Right Ankle Eversion  4+/5    Left Ankle Dorsiflexion  4+/5    Left Ankle Plantar Flexion  4+/5    Left Ankle Inversion  4/5    Left Ankle Eversion  4+/5      Balance   Balance Assessed  Yes      Static Standing Balance   Static Standing Balance -  Activities   Single Leg Stance - Right  Leg;Single Leg Stance - Left Leg    Static Standing - Comment/# of Minutes  R & L SLS 10 sec without UE support                   [facility] Adult PT Treatment/Exercise - 09/26/18 0001      Ankle Exercises: Aerobic   Nustep  L3 x 54mn LEs      Ankle Exercises: Supine   Other Supine Ankle Exercises  supine bridge with ball b/w knees x10      Ankle Exercises: Standing   SLS  R & L SLS at counter top x2 min             PT Education - 09/26/18 1153    Education Details  update and consolidation of HEP; discussion of continuing fitness activities to tolerance    Person(s) Educated  Patient    Methods  Explanation;Demonstration;Tactile cues;Verbal cues;Handout    Comprehension  Verbalized understanding;Returned demonstration       PT Short Term Goals - 09/26/18 1115      PT SHORT TERM GOAL #1   Title  Patient to be independent with initial HEP.    Time  3    Period  Weeks    Status  Achieved        PT Long Term Goals - 09/26/18 1115      PT LONG TERM GOAL #1   Title  Patient to be independent with advanced HEP.    Time  6    Period  Weeks    Status  Achieved      PT LONG TERM GOAL #2   Title  [facility]PROM.    Time  6    Period  Weeks   improvements demonstrated in R ankle PF, inversion, eversion AROM and PROM   Status  Achieved      PT LONG TERM GOAL #3   Title  Patient to demonstrate >=4+/5 strength in B LEs.     Time  6    Period  Weeks    Status  Partially Met   4/5 strength in B hip adduction and ankle inversion; all overs 4+/5     PT LONG TERM GOAL #4   Title  Patient to demonstrate R SLS for 10 sec without LOB.    Time  6    Period  Weeks    Status  Achieved      PT LONG TERM GOAL #5   Title  Patient to report tolerance of 1 hour of walking without pain limiting.     Time  6    Period  Weeks    Status  Partially Met   reports 56mn without pain or limping     PT LONG TERM GOAL #6   Title   Patient to return to bowling without pain limiting.     Time  6    Period  Weeks    Status  Partially Met   has returned to bowling 2x with moderate pain           Plan - 09/26/18 1202    Clinical Impression Statement  Patient arrived to session late, reporting that her mother recently passed away. Patient requesting to wrap up with PT today d/t nearly completely returning to PLOF. Reports that she has returned to bowling, although with moderate pain after a game. Also plans on returning to dance on Jan 1st. Reports she has noticed improvements in walking tolerance and quality. Improvements demonstrated in R ankle PF, inversion, eversion AROM and PROM. Patient has shown improvements in strength testing, most limited in B hip adduction and ankle inversion. Patient able to perform SLS for 10 sec on each LE without LOB. Reports 474m of walking before onset of pain. Reviewed HEP with patient and consolidated HEP for further compliance with exercise program. Discussed continuing patient's fitness activities to tolerance and avoiding pushing herself to do too much, too quickly. Patient agreeable. No complaints at end of session. Patient D/C'd at this time per patient's request.     Clinical Impairments Affecting Rehab Potential  R RTC tear, HTN, GERD, asthma, L RTC repair, lumbar disc surgery    PT Treatment/Interventions  ADLs/Self Care Home Management;Cryotherapy;Electrical Stimulation;Moist Heat;Ultrasound;DME Instruction;Gait training;Stair training;Functional mobility training;Therapeutic activities;Therapeutic exercise;Manual techniques;Orthotic Fit/Training;Patient/family education;Neuromuscular re-education;Balance training;Scar mobilization;Passive range of motion;Dry needling;Energy conservation;Splinting;Taping;Vasopneumatic Device    PT Next Visit Plan  D/C at this time    Consulted and Agree with Plan of Care  Patient       Patient will benefit from skilled therapeutic intervention in  order to improve the following deficits and impairments:  Decreased endurance, Hypomobility, Increased edema, Decreased scar mobility, Decreased activity tolerance, Decreased strength, Pain, Difficulty walking, Decreased mobility, Decreased balance, Decreased range of motion, Impaired flexibility, Improper body mechanics  Visit Diagnosis: Pain in right ankle and joints of right foot  Stiffness of right ankle, not elsewhere classified  Difficulty in walking, not elsewhere classified  Muscle weakness (generalized)  Dizziness and giddiness  Unsteadiness on feet     Problem List Patient Active Problem List   Diagnosis Date Noted  . Acute labyrinthitis, unspecified laterality 09/08/2018  . Trimalleolar fracture of right ankle 06/24/2018  . Closed trimalleolar fracture of right ankle 06/21/2018  . Osteoarthritis 09/22/2012  . UNSPECIFIED DISORDER TEETH&SUPPORTING STRUCTURES 09/09/2010  . Essential hypertension 04/07/2007  . Allergic rhinitis 04/07/2007  . Asthma 04/07/2007  . GERD 04/07/2007  . COLONIC POLYPS, HX OF 04/07/2007    PHYSICAL THERAPY DISCHARGE SUMMARY  Visits from Start of Care: 9  Current functional level related to goals / functional outcomes: See above clinical impression   Remaining deficits: Decreased strength, decreased walking tolerance, pain with bowling   Education / Equipment: HEP  Plan: Patient agrees to discharge.  Patient goals were partially met. Patient is being discharged due to the patient's request.  ?????     Janene Harvey, PT, DPT 09/26/18 12:05 PM   [facility] [address]  [place], [name], [address] Phone: [phone]   Fax:  [phone]  Name: KEATYN LUCK MRN: [id number] Date of Birth: [birth year]

## 2018-09-28 ENCOUNTER — Encounter: Payer: Self-pay | Admitting: Internal Medicine

## 2018-09-28 ENCOUNTER — Encounter: Payer: Medicare Other | Admitting: Physical Therapy

## 2018-09-30 ENCOUNTER — Encounter: Payer: Self-pay | Admitting: Internal Medicine

## 2018-10-04 ENCOUNTER — Other Ambulatory Visit: Payer: Self-pay | Admitting: Internal Medicine

## 2018-10-21 ENCOUNTER — Other Ambulatory Visit: Payer: Self-pay | Admitting: *Deleted

## 2018-10-21 MED ORDER — CELECOXIB 200 MG PO CAPS: 200.0000 mg | ORAL_CAPSULE | Freq: Every day | ORAL | 1 refills | Status: DC

## 2018-10-25 ENCOUNTER — Other Ambulatory Visit: Payer: Self-pay | Admitting: Internal Medicine

## 2018-10-27 ENCOUNTER — Other Ambulatory Visit: Payer: Self-pay | Admitting: Internal Medicine

## 2018-12-13 ENCOUNTER — Other Ambulatory Visit: Payer: Self-pay | Admitting: *Deleted

## 2018-12-13 MED ORDER — FLUTICASONE PROPIONATE 50 MCG/ACT NA SUSP: 2.0000 | Freq: Every day | NASAL | 2 refills | Status: DC

## 2018-12-14 ENCOUNTER — Other Ambulatory Visit: Payer: Self-pay | Admitting: *Deleted

## 2018-12-14 MED ORDER — BENAZEPRIL HCL 40 MG PO TABS: 40.0000 mg | ORAL_TABLET | Freq: Every day | ORAL | 1 refills | Status: DC

## 2018-12-15 DIAGNOSIS — H5203 Hypermetropia, bilateral: Secondary | ICD-10-CM | POA: Diagnosis not present

## 2018-12-15 DIAGNOSIS — H2513 Age-related nuclear cataract, bilateral: Secondary | ICD-10-CM | POA: Diagnosis not present

## 2019-01-02 ENCOUNTER — Other Ambulatory Visit: Payer: Self-pay

## 2019-01-02 ENCOUNTER — Encounter: Payer: Self-pay | Admitting: Internal Medicine

## 2019-01-02 ENCOUNTER — Ambulatory Visit (INDEPENDENT_AMBULATORY_CARE_PROVIDER_SITE_OTHER): Payer: Medicare Other | Admitting: Internal Medicine

## 2019-01-02 VITALS — BP 138/70 | HR 103 | Temp 98.4°F | Wt 165.5 lb

## 2019-01-02 DIAGNOSIS — Z634 Disappearance and death of family member: Secondary | ICD-10-CM | POA: Diagnosis not present

## 2019-01-02 DIAGNOSIS — I10 Essential (primary) hypertension: Secondary | ICD-10-CM | POA: Diagnosis not present

## 2019-01-02 DIAGNOSIS — J4520 Mild intermittent asthma, uncomplicated: Secondary | ICD-10-CM

## 2019-01-02 DIAGNOSIS — S82851D Displaced trimalleolar fracture of right lower leg, subsequent encounter for closed fracture with routine healing: Secondary | ICD-10-CM

## 2019-01-02 MED ORDER — TRAMADOL HCL 50 MG PO TABS: 50.0000 mg | ORAL_TABLET | Freq: Four times a day (QID) | ORAL | 0 refills | Status: DC

## 2019-01-02 MED ORDER — FLUTICASONE-SALMETEROL 250-50 MCG/DOSE IN AEPB: 1.0000 | INHALATION_SPRAY | Freq: Two times a day (BID) | RESPIRATORY_TRACT | 3 refills | Status: DC

## 2019-01-02 MED ORDER — ALBUTEROL SULFATE HFA 108 (90 BASE) MCG/ACT IN AERS: 2.0000 | INHALATION_SPRAY | Freq: Four times a day (QID) | RESPIRATORY_TRACT | 0 refills | Status: DC | PRN

## 2019-01-02 NOTE — Progress Notes (Signed)
Established Patient Office Visit     CC/Reason for Visit: Medication refills  HPI: Kristy [name] is a 78 y.o. female who is coming in today for the above mentioned reasons. Past Medical History is significant for: asthma, recent ankle fracture s/p repair, HTN, GERD all of which are stable.  She needs refills of some of her chronic medications including albuterol, Advair, tramadol that she uses for pain following her ankle fracture.  Unfortunately her husband passed away unexpectedly at home last week with a cardiac arrest, she had to perform CPR at home, EMS was able to regain pulse but unfortunately he was declared brain dead in the hospital and she removed life support.  She appears to be doing well, has good family and friend support.  She has no acute complaints today.   Past Medical/Surgical History: Past Medical History:  Diagnosis Date   Allergic rhinitis, seasonal    Arthritis    Asthma    GERD (gastroesophageal reflux disease)    History of colon polyps    Hypertension    Rotator cuff tear, right     Past Surgical History:  Procedure Laterality Date   CHOLECYSTECTOMY OPEN  AGE 69   INGUINAL HERNIA REPAIR Right AGE 69s   KNEE ARTHROSCOPY Left 2014   LUMBAR DISC SURGERY  x2  LAST DATE EARLY 2000s   ORIF ANKLE FRACTURE Right 06/24/2018   Procedure: OPEN REDUCTION INTERNAL FIXATION (ORIF) RIGHT ANKLE FRACTURE;  Surgeon: Kathryne Hitch, MD;  Location: [facility];  Service: Orthopedics;  Laterality: Right;   ROTATOR CUFF REPAIR Left 01/2015   TONSILLECTOMY  AGE 35   VAGINAL HYSTERECTOMY  AGE 89   WITH BSO    Social History:  reports that she quit smoking about 30 years ago. She quit after 10.00 years of use. She has never used smokeless tobacco. She reports current alcohol use of about 3.0 - 4.0 standard drinks of alcohol per week. She reports that she does not use drugs.  Allergies: Allergies  Allergen Reactions   Aspirin Itching   Clonidine  Hives    Family History:  Family History  Problem Relation Age of Onset   Colon cancer Neg Hx    Esophageal cancer Neg Hx    Rectal cancer Neg Hx    Stomach cancer Neg Hx      Current Outpatient Medications:    albuterol (PROVENTIL HFA;VENTOLIN HFA) 108 (90 Base) MCG/ACT inhaler, Inhale 2 puffs into the lungs every 6 (six) hours as needed for wheezing., Disp: 1 each, Rfl: 0   benazepril (LOTENSIN) 40 MG tablet, Take 1 tablet (40 mg total) by mouth daily., Disp: 90 tablet, Rfl: 1   celecoxib (CELEBREX) 200 MG capsule, Take 1 capsule (200 mg total) by mouth daily., Disp: 90 capsule, Rfl: 1   COLLAGEN PO, Take by mouth. Collagen powder-mix with coffee and drink daily, Disp: , Rfl:    fluticasone (FLONASE) 50 MCG/ACT nasal spray, Place 2 sprays into both nostrils daily., Disp: 16 g, Rfl: 2   Fluticasone-Salmeterol (ADVAIR DISKUS) 250-50 MCG/DOSE AEPB, Inhale 1 puff into the lungs every 12 (twelve) hours., Disp: 1 each, Rfl: 3   hydrochlorothiazide (HYDRODIURIL) 25 MG tablet, TAKE 1 TABLET BY MOUTH EVERY DAY, Disp: 90 tablet, Rfl: 3   Liniments (SALONPAS PAIN RELIEF PATCH EX), Apply 1 patch topically daily as needed (pain)., Disp: , Rfl:    Menthol, Topical Analgesic, (BLUE-EMU MAXIMUM STRENGTH EX), Apply 1 application topically daily., Disp: , Rfl:  traMADol (ULTRAM) 50 MG tablet, Take 1 tablet (50 mg total) by mouth every 6 (six) hours., Disp: 90 tablet, Rfl: 0  Review of Systems:  Constitutional: Denies fever, chills, diaphoresis, appetite change and fatigue.  HEENT: Denies photophobia, eye pain, redness, hearing loss, ear pain, congestion, sore throat, rhinorrhea, sneezing, mouth sores, trouble swallowing, neck pain, neck stiffness and tinnitus.   Respiratory: Denies SOB, DOE, cough, chest tightness,  and wheezing.   Cardiovascular: Denies chest pain, palpitations and leg swelling.  Gastrointestinal: Denies nausea, vomiting, abdominal pain, diarrhea, constipation, blood  in stool and abdominal distention.  Genitourinary: Denies dysuria, urgency, frequency, hematuria, flank pain and difficulty urinating.  Endocrine: Denies: hot or cold intolerance, sweats, changes in hair or nails, polyuria, polydipsia. Musculoskeletal: Denies myalgias, back pain, joint swelling, arthralgias and gait problem.  Skin: Denies pallor, rash and wound.  Neurological: Denies dizziness, seizures, syncope, weakness, light-headedness, numbness and headaches.  Hematological: Denies adenopathy. Easy bruising, personal or family bleeding history  Psychiatric/Behavioral: Denies suicidal ideation, mood changes, confusion, nervousness, sleep disturbance and agitation    Physical Exam: Vitals:   01/02/19 1518  BP: 138/70  Pulse: (!) 103  Temp: 98.4 F (36.9 C)  TempSrc: Oral  SpO2: 96%  Weight: 165 lb 8 oz (75.1 kg)    Body mass index is 32.32 kg/m.   Constitutional: NAD, calm, comfortable Eyes: PERRL, lids and conjunctivae normal ENMT: Mucous membranes are moist.  Respiratory: clear to auscultation bilaterally, no wheezing, no crackles. Normal respiratory effort. No accessory muscle use.  Cardiovascular: Regular rate and rhythm, no murmurs / rubs / gallops. No extremity edema. 2+ pedal pulses. No carotid bruits.  Musculoskeletal: no clubbing / cyanosis. No joint deformity upper and lower extremities. Good ROM, no contractures. Normal muscle tone.  Skin: no rashes, lesions, ulcers. No induration  Psychiatric: Normal judgment and insight. Alert and oriented x 3. Normal mood.    Impression and Plan:  Essential hypertension -Well-controlled on current regimen  Mild intermittent asthma, unspecified whether complicated  -Well-controlled, has sometimes seasonal flareups but so far has been good. -We will refill Advair and albuterol today.  Closed trimalleolar fracture of right ankle with routine healing, subsequent encounter -  -she has been released from orthopedic care, will  refill tramadol.  Grief/Bereavement -Following recent, unexpected death of husband, she seems to be coping well, has good family and friend support, she knows to reach out to [place] for any issues.    Patient Instructions  -Nice seeing you today!  -Sorry to hear about the passing of your husband. Reach out to [place] if you need anything.  -Schedule follow up in 3-4 months.   Complicated Grief Grief is a normal response to the death of someone close to you. Feelings of fear, anger, and guilt can affect almost everyone who loses a loved one. It is also common to have symptoms of depression while you are grieving. These include problems with sleep, loss of appetite, and lack of energy. They may last for weeks or months after a loss. Complicated grief is different from normal grief or depression. Normal grieving involves sadness and feelings of loss, but those feelings get better and heal over time. Complicated grief is a severe type of grief that lasts for a long time, usually for several months to a year or longer. It interferes with your ability to function normally. Complicated grief may require treatment from a mental health care provider. What are the causes? The cause of this condition is not known. It is  not clear why some people continue to struggle with grief and others do not. What increases the risk? You are more likely to develop this condition if:  The death of your loved one was sudden or unexpected.  The death of your loved one was due to a violent event.  Your loved one died from suicide.  Your loved one was a child or a young person.  You were very close to your loved one, or you were dependent on him or her.  You have a history of depression or anxiety. What are the signs or symptoms? Symptoms of this condition include:  Feeling disbelief or having a lack of emotion (numbness).  Being unable to enjoy good memories of your loved one.  Needing to avoid anything or anyone  that reminds you of your loved one.  Being unable to stop thinking about the death.  Feeling intense anger or guilt.  Feeling alone and hopeless.  Feeling that your life is meaningless and empty.  Losing the desire to move on with your life. How is this diagnosed? This condition may be diagnosed based on:  Your symptoms. Complicated grief will be diagnosed if you have ongoing symptoms of grief for 6-12 months or longer.  The effect of symptoms on your life. You may be diagnosed with this condition if your symptoms are interfering with your ability to live your life. Your health care provider may recommend that you see a mental health care provider. Many symptoms of depression are similar to the symptoms of complicated grief. It is important to be evaluated for complicated grief along with other mental health conditions. How is this treated? This condition is most commonly treated with talk therapy. This therapy is offered by a mental health specialist (psychiatrist). During therapy:  You will learn healthy ways to cope with the loss of your loved one.  Your mental health care provider may recommend antidepressant medicines. Follow these instructions at home: Lifestyle   Take care of yourself. ? Eat on a regular basis, and maintain a healthy diet. Eat plenty of fruits, vegetables, lean protein, and whole grains. ? Try to get some exercise each day. Aim for 30 minutes of exercise on most days of the week. ? Keep a consistent sleep schedule. Try to get 8 or more hours of sleep each night. ? Start doing the things that you used to enjoy.  Do not use drugs or alcohol to ease your symptoms.  Spend time with friends and loved ones. General instructions  Take over-the-counter and prescription medicines only as told by your health care provider.  Consider joining a grief (bereavement) support group to help you deal with your loss.  Keep all follow-up visits as told by your health  care provider. This is important. Contact a health care provider if:  Your symptoms prevent you from functioning normally.  Your symptoms do not get better with treatment. Get help right away if:  You have serious thoughts about hurting yourself or someone else.  You have suicidal feelings. If you ever feel like you may hurt yourself or others, or have thoughts about taking your own life, get help right away. You can go to your nearest emergency department or call:  Your local emergency services (911 in the [place]  A suicide crisis helpline, such as the National Suicide Prevention Lifeline at 928-226-8691. This is open 24 hours a day. Summary  Complicated grief is a severe type of grief that lasts for a long time. This  grief is not likely to go away on its own. Get the help you need.  Some griefs are more difficult than others and can cause this condition. You may need a certain type of treatment to help you recover if the loss of your loved one was sudden, violent, or due to suicide.  You may feel guilty about moving on with your life. Getting help does not mean that you are forgetting your loved one. It means that you are taking care of yourself.  Complicated grief is best treated with talk therapy. Medicines may also be prescribed.  Seek the help you need, and find support that will help you recover. This information is not intended to replace advice given to you by your health care provider. Make sure you discuss any questions you have with your health care provider. Document Released: 10/05/2005 Document Revised: 07/21/2017 Document Reviewed: 07/21/2017 Elsevier Interactive Patient Education  2019 [employer]      Chaya Jan, MD [facility]

## 2019-01-02 NOTE — Patient Instructions (Signed)
-Nice seeing you today!  -Sorry to hear about the passing of your husband. Reach out to [place] if you need anything.  -Schedule follow up in 3-4 months.   Complicated Grief Grief is a normal response to the death of someone close to you. Feelings of fear, anger, and guilt can affect almost everyone who loses a loved one. It is also common to have symptoms of depression while you are grieving. These include problems with sleep, loss of appetite, and lack of energy. They may last for weeks or months after a loss. Complicated grief is different from normal grief or depression. Normal grieving involves sadness and feelings of loss, but those feelings get better and heal over time. Complicated grief is a severe type of grief that lasts for a long time, usually for several months to a year or longer. It interferes with your ability to function normally. Complicated grief may require treatment from a mental health care provider. What are the causes? The cause of this condition is not known. It is not clear why some people continue to struggle with grief and others do not. What increases the risk? You are more likely to develop this condition if:  The death of your loved one was sudden or unexpected.  The death of your loved one was due to a violent event.  Your loved one died from suicide.  Your loved one was a child or a young person.  You were very close to your loved one, or you were dependent on him or her.  You have a history of depression or anxiety. What are the signs or symptoms? Symptoms of this condition include:  Feeling disbelief or having a lack of emotion (numbness).  Being unable to enjoy good memories of your loved one.  Needing to avoid anything or anyone that reminds you of your loved one.  Being unable to stop thinking about the death.  Feeling intense anger or guilt.  Feeling alone and hopeless.  Feeling that your life is meaningless and empty.  Losing the desire  to move on with your life. How is this diagnosed? This condition may be diagnosed based on:  Your symptoms. Complicated grief will be diagnosed if you have ongoing symptoms of grief for 6-12 months or longer.  The effect of symptoms on your life. You may be diagnosed with this condition if your symptoms are interfering with your ability to live your life. Your health care provider may recommend that you see a mental health care provider. Many symptoms of depression are similar to the symptoms of complicated grief. It is important to be evaluated for complicated grief along with other mental health conditions. How is this treated? This condition is most commonly treated with talk therapy. This therapy is offered by a mental health specialist (psychiatrist). During therapy:  You will learn healthy ways to cope with the loss of your loved one.  Your mental health care provider may recommend antidepressant medicines. Follow these instructions at home: Lifestyle   Take care of yourself. ? Eat on a regular basis, and maintain a healthy diet. Eat plenty of fruits, vegetables, lean protein, and whole grains. ? Try to get some exercise each day. Aim for 30 minutes of exercise on most days of the week. ? Keep a consistent sleep schedule. Try to get 8 or more hours of sleep each night. ? Start doing the things that you used to enjoy.  Do not use drugs or alcohol to ease your symptoms.  Spend time with friends and loved ones. General instructions  Take over-the-counter and prescription medicines only as told by your health care provider.  Consider joining a grief (bereavement) support group to help you deal with your loss.  Keep all follow-up visits as told by your health care provider. This is important. Contact a health care provider if:  Your symptoms prevent you from functioning normally.  Your symptoms do not get better with treatment. Get help right away if:  You have serious  thoughts about hurting yourself or someone else.  You have suicidal feelings. If you ever feel like you may hurt yourself or others, or have thoughts about taking your own life, get help right away. You can go to your nearest emergency department or call:  Your local emergency services (911 in the [place]  A suicide crisis helpline, such as the National Suicide Prevention Lifeline at (423)469-4150. This is open 24 hours a day. Summary  Complicated grief is a severe type of grief that lasts for a long time. This grief is not likely to go away on its own. Get the help you need.  Some griefs are more difficult than others and can cause this condition. You may need a certain type of treatment to help you recover if the loss of your loved one was sudden, violent, or due to suicide.  You may feel guilty about moving on with your life. Getting help does not mean that you are forgetting your loved one. It means that you are taking care of yourself.  Complicated grief is best treated with talk therapy. Medicines may also be prescribed.  Seek the help you need, and find support that will help you recover. This information is not intended to replace advice given to you by your health care provider. Make sure you discuss any questions you have with your health care provider. Document Released: 10/05/2005 Document Revised: 07/21/2017 Document Reviewed: 07/21/2017 Elsevier Interactive Patient Education  2019 [employer].

## 2019-02-03 ENCOUNTER — Other Ambulatory Visit: Payer: Self-pay | Admitting: Internal Medicine

## 2019-02-03 DIAGNOSIS — J4520 Mild intermittent asthma, uncomplicated: Secondary | ICD-10-CM

## 2019-02-23 DIAGNOSIS — M2550 Pain in unspecified joint: Secondary | ICD-10-CM | POA: Diagnosis not present

## 2019-02-23 DIAGNOSIS — M154 Erosive (osteo)arthritis: Secondary | ICD-10-CM | POA: Diagnosis not present

## 2019-02-23 DIAGNOSIS — Z79899 Other long term (current) drug therapy: Secondary | ICD-10-CM | POA: Diagnosis not present

## 2019-02-23 DIAGNOSIS — M150 Primary generalized (osteo)arthritis: Secondary | ICD-10-CM | POA: Diagnosis not present

## 2019-02-27 DIAGNOSIS — M154 Erosive (osteo)arthritis: Secondary | ICD-10-CM | POA: Diagnosis not present

## 2019-02-27 DIAGNOSIS — Z79899 Other long term (current) drug therapy: Secondary | ICD-10-CM | POA: Diagnosis not present

## 2019-03-03 ENCOUNTER — Other Ambulatory Visit: Payer: Self-pay | Admitting: Internal Medicine

## 2019-03-03 DIAGNOSIS — J4520 Mild intermittent asthma, uncomplicated: Secondary | ICD-10-CM

## 2019-03-10 ENCOUNTER — Other Ambulatory Visit: Payer: Self-pay | Admitting: Internal Medicine

## 2019-03-14 ENCOUNTER — Encounter: Payer: Self-pay | Admitting: Internal Medicine

## 2019-03-14 ENCOUNTER — Ambulatory Visit (INDEPENDENT_AMBULATORY_CARE_PROVIDER_SITE_OTHER): Payer: Medicare Other | Admitting: Internal Medicine

## 2019-03-14 ENCOUNTER — Other Ambulatory Visit: Payer: Self-pay

## 2019-03-14 VITALS — BP 130/80 | HR 91 | Temp 98.6°F | Ht 61.0 in | Wt 169.1 lb

## 2019-03-14 DIAGNOSIS — I10 Essential (primary) hypertension: Secondary | ICD-10-CM | POA: Diagnosis not present

## 2019-03-14 DIAGNOSIS — J4520 Mild intermittent asthma, uncomplicated: Secondary | ICD-10-CM

## 2019-03-14 DIAGNOSIS — Z0000 Encounter for general adult medical examination without abnormal findings: Secondary | ICD-10-CM

## 2019-03-14 DIAGNOSIS — K219 Gastro-esophageal reflux disease without esophagitis: Secondary | ICD-10-CM

## 2019-03-14 DIAGNOSIS — R7302 Impaired glucose tolerance (oral): Secondary | ICD-10-CM | POA: Insufficient documentation

## 2019-03-14 DIAGNOSIS — S82851D Displaced trimalleolar fracture of right lower leg, subsequent encounter for closed fracture with routine healing: Secondary | ICD-10-CM

## 2019-03-14 DIAGNOSIS — R7301 Impaired fasting glucose: Secondary | ICD-10-CM

## 2019-03-14 LAB — COMPREHENSIVE METABOLIC PANEL
ALT: 12 U/L (ref 0–35)
AST: 13 U/L (ref 0–37)
Albumin: 4.2 g/dL (ref 3.5–5.2)
Alkaline Phosphatase: 61 U/L (ref 39–117)
BUN: 18 mg/dL (ref 6–23)
CO2: 29 mEq/L (ref 19–32)
Calcium: 9.5 mg/dL (ref 8.4–10.5)
Chloride: 102 mEq/L (ref 96–112)
Creatinine, Ser: 0.71 mg/dL (ref 0.40–1.20)
GFR: 95.91 mL/min (ref >60.00)
Glucose, Bld: 113 mg/dL — ABNORMAL HIGH (ref 70–99)
Potassium: 3.7 mEq/L (ref 3.5–5.1)
Sodium: 140 mEq/L (ref 135–145)
Total Bilirubin: 0.4 mg/dL (ref 0.2–1.2)
Total Protein: 6.9 g/dL (ref 6.0–8.3)

## 2019-03-14 LAB — CBC WITH DIFFERENTIAL/PLATELET
Basophils Absolute: 0 10*3/uL (ref 0.0–0.1)
Basophils Relative: 0.4 % (ref 0.0–3.0)
Eosinophils Absolute: 0.4 10*3/uL (ref 0.0–0.7)
Eosinophils Relative: 5.1 % — ABNORMAL HIGH (ref 0.0–5.0)
HCT: 37.4 % (ref 36.0–46.0)
Hemoglobin: 12.6 g/dL (ref 12.0–15.0)
Lymphocytes Relative: 34.6 % (ref 12.0–46.0)
Lymphs Abs: 2.6 10*3/uL (ref 0.7–4.0)
MCHC: 33.8 g/dL (ref 30.0–36.0)
MCV: 90.9 fl (ref 78.0–100.0)
Monocytes Absolute: 0.4 10*3/uL (ref 0.1–1.0)
Monocytes Relative: 5.9 % (ref 3.0–12.0)
Neutro Abs: 4 10*3/uL (ref 1.4–7.7)
Neutrophils Relative %: 54 % (ref 43.0–77.0)
Platelets: 297 10*3/uL (ref 150.0–400.0)
RBC: 4.11 Mil/uL (ref 3.87–5.11)
RDW: 13.6 % (ref 11.5–15.5)
WBC: 7.5 10*3/uL (ref 4.0–10.5)

## 2019-03-14 LAB — LIPID PANEL
Cholesterol: 197 mg/dL (ref 0–200)
HDL: 60.9 mg/dL (ref >39.00)
LDL Cholesterol: 116 mg/dL — ABNORMAL HIGH (ref 0–99)
NonHDL: 135.72
Total CHOL/HDL Ratio: 3
Triglycerides: 98 mg/dL (ref 0.0–149.0)
VLDL: 19.6 mg/dL (ref 0.0–40.0)

## 2019-03-14 LAB — HEMOGLOBIN A1C: Hgb A1c MFr Bld: 6.3 % (ref 4.6–6.5)

## 2019-03-14 MED ORDER — TRAMADOL HCL 50 MG PO TABS: 50.0000 mg | ORAL_TABLET | Freq: Two times a day (BID) | ORAL | 0 refills | Status: DC | PRN

## 2019-03-14 NOTE — Addendum Note (Signed)
Addended by: Conrad Tchula on: 03/14/2019 08:10 AM   Modules accepted: Orders

## 2019-03-14 NOTE — Addendum Note (Signed)
Addended by: Conrad Everglades on: 03/14/2019 02:27 PM   Modules accepted: Orders

## 2019-03-14 NOTE — Patient Instructions (Signed)
-Nice seeing your today!  -Lab work today. Will notify you when results are available.  -Shingles and tetanus immunizations at drug store.   Preventive Care 9 Years and Older, Female Preventive care refers to lifestyle choices and visits with your health care provider that can promote health and wellness. What does preventive care include?  A yearly physical exam. This is also called an annual well check.  Dental exams once or twice a year.  Routine eye exams. Ask your health care provider how often you should have your eyes checked.  Personal lifestyle choices, including: ? Daily care of your teeth and gums. ? Regular physical activity. ? Eating a healthy diet. ? Avoiding tobacco and drug use. ? Limiting alcohol use. ? Practicing safe sex. ? Taking low-dose aspirin every day. ? Taking vitamin and mineral supplements as recommended by your health care provider. What happens during an annual well check? The services and screenings done by your health care provider during your annual well check will depend on your age, overall health, lifestyle risk factors, and family history of disease. Counseling Your health care provider may ask you questions about your:  Alcohol use.  Tobacco use.  Drug use.  Emotional well-being.  Home and relationship well-being.  Sexual activity.  Eating habits.  History of falls.  Memory and ability to understand (cognition).  Work and work Statistician.  Reproductive health.  Screening You may have the following tests or measurements:  Height, weight, and BMI.  Blood pressure.  Lipid and cholesterol levels. These may be checked every 5 years, or more frequently if you are over 22 years old.  Skin check.  Lung cancer screening. You may have this screening every year starting at age 20 if you have a 30-pack-year history of smoking and currently smoke or have quit within the past 15 years.  Colorectal cancer screening. All adults  should have this screening starting at age 43 and continuing until age 61. You will have tests every 1-10 years, depending on your results and the type of screening test. People at increased risk should start screening at an earlier age. Screening tests may include: ? Guaiac-based fecal occult blood testing. ? Fecal immunochemical test (FIT). ? Stool DNA test. ? Virtual colonoscopy. ? Sigmoidoscopy. During this test, a flexible tube with a tiny camera (sigmoidoscope) is used to examine your rectum and lower colon. The sigmoidoscope is inserted through your anus into your rectum and lower colon. ? Colonoscopy. During this test, a long, thin, flexible tube with a tiny camera (colonoscope) is used to examine your entire colon and rectum.  Hepatitis C blood test.  Hepatitis B blood test.  Sexually transmitted disease (STD) testing.  Diabetes screening. This is done by checking your blood sugar (glucose) after you have not eaten for a while (fasting). You may have this done every 1-3 years.  Bone density scan. This is done to screen for osteoporosis. You may have this done starting at age 69.  Mammogram. This may be done every 1-2 years. Talk to your health care provider about how often you should have regular mammograms. Talk with your health care provider about your test results, treatment options, and if necessary, the need for more tests. Vaccines Your health care provider may recommend certain vaccines, such as:  Influenza vaccine. This is recommended every year.  Tetanus, diphtheria, and acellular pertussis (Tdap, Td) vaccine. You may need a Td booster every 10 years.  Varicella vaccine. You may need this if you have  not been vaccinated.  Zoster vaccine. You may need this after age 62.  Measles, mumps, and rubella (MMR) vaccine. You may need at least one dose of MMR if you were born in 1957 or later. You may also need a second dose.  Pneumococcal 13-valent conjugate (PCV13) vaccine.  One dose is recommended after age 33.  Pneumococcal polysaccharide (PPSV23) vaccine. One dose is recommended after age 23.  Meningococcal vaccine. You may need this if you have certain conditions.  Hepatitis A vaccine. You may need this if you have certain conditions or if you travel or work in places where you may be exposed to hepatitis A.  Hepatitis B vaccine. You may need this if you have certain conditions or if you travel or work in places where you may be exposed to hepatitis B.  Haemophilus influenzae type b (Hib) vaccine. You may need this if you have certain conditions. Talk to your health care provider about which screenings and vaccines you need and how often you need them. This information is not intended to replace advice given to you by your health care provider. Make sure you discuss any questions you have with your health care provider. Document Released: 11/01/2015 Document Revised: 11/25/2017 Document Reviewed: 08/06/2015 Elsevier Interactive Patient Education  2019 [employer].

## 2019-03-14 NOTE — Progress Notes (Signed)
Established Patient Office Visit     CC/Reason for Visit: Annual medicare wellness exam  HPI: Kristy [name] is a 78 y.o. female who is coming in today for the above mentioned reasons. Past Medical History is significant for: asthma, recent ankle fracture s/p repair, HTN, GERDall of which are stable. She is doing well after the recent unexpected passing of her husband at home, she has good family and friend support.  She has no acute complaints today.  She is a never smoker, she has routine eye and dental care, we have discussed tetanus and shingles vaccination which she will receive at pharmacy.  She remains physically active and walks 3 miles a day, she also does dance and Zumba, however due to the COVID-19 pandemic she is taking these classes currently.  She is up-to-date on all cancer screenings.         Past Medical/Surgical History: Past Medical History:  Diagnosis Date  . Allergic rhinitis, seasonal   . Arthritis   . Asthma   . GERD (gastroesophageal reflux disease)   . History of colon polyps   . Hypertension   . Rotator cuff tear, right     Past Surgical History:  Procedure Laterality Date  . CHOLECYSTECTOMY OPEN  AGE 23  . INGUINAL HERNIA REPAIR Right AGE [age]s  . KNEE ARTHROSCOPY Left 2014  . LUMBAR [place] SURGERY  x2  LAST DATE EARLY 2000s  . ORIF ANKLE FRACTURE Right 06/24/2018   Procedure: OPEN REDUCTION INTERNAL FIXATION (ORIF) RIGHT ANKLE FRACTURE;  Surgeon: Mcarthur Rossetti, MD;  Location: [facility];  Service: Orthopedics;  Laterality: Right;  . ROTATOR CUFF REPAIR Left 01/2015  . TONSILLECTOMY  AGE 89  . VAGINAL HYSTERECTOMY  AGE 78   WITH BSO    Social History:  reports that she quit smoking about 30 years ago. She quit after 10.00 years of use. She has never used smokeless tobacco. She reports current alcohol use of about 3.0 - 4.0 standard drinks of alcohol per week. She reports that she does not use drugs.  Allergies: Allergies  Allergen  Reactions  . Clonidine Hives    Family History:  Family History  Problem Relation Age of Onset  . Colon cancer Neg Hx   . Esophageal cancer Neg Hx   . Rectal cancer Neg Hx   . Stomach cancer Neg Hx      Current Outpatient Medications:  .  albuterol (VENTOLIN HFA) 108 (90 Base) MCG/ACT inhaler, INHALE 2 PUFFS BY MOUTH EVERY 6 HOURS AS NEEDED FOR WHEEZE, Disp: 6.7 Inhaler, Rfl: 0 .  benazepril (LOTENSIN) 40 MG tablet, Take 1 tablet (40 mg total) by mouth daily., Disp: 90 tablet, Rfl: 1 .  celecoxib (CELEBREX) 200 MG capsule, Take 1 capsule (200 mg total) by mouth daily., Disp: 90 capsule, Rfl: 1 .  COLLAGEN PO, Take by mouth. Collagen powder-mix with coffee and drink daily, Disp: , Rfl:  .  fluticasone (FLONASE) 50 MCG/ACT nasal spray, Place 2 sprays into both nostrils daily., Disp: 16 g, Rfl: 2 .  Fluticasone-Salmeterol (ADVAIR DISKUS) 250-50 MCG/DOSE AEPB, Inhale 1 puff into the lungs every 12 (twelve) hours., Disp: 1 each, Rfl: 3 .  hydrochlorothiazide (HYDRODIURIL) 25 MG tablet, TAKE 1 TABLET BY MOUTH EVERY DAY, Disp: 90 tablet, Rfl: 3 .  Liniments (SALONPAS PAIN RELIEF PATCH EX), Apply 1 patch topically daily as needed (pain)., Disp: , Rfl:  .  Menthol, Topical Analgesic, (BLUE-EMU MAXIMUM STRENGTH EX), Apply 1 application topically daily.,  Disp: , Rfl:  .  traMADol (ULTRAM) 50 MG tablet, Take 1 tablet (50 mg total) by mouth every 6 (six) hours. (Patient taking differently: Take 50 mg by mouth every 12 (twelve) hours as needed. ), Disp: 90 tablet, Rfl: 0  Review of Systems:  Constitutional: Denies fever, chills, diaphoresis, appetite change and fatigue.  HEENT: Denies photophobia, eye pain, redness, hearing loss, ear pain, congestion, sore throat, rhinorrhea, sneezing, mouth sores, trouble swallowing, neck pain, neck stiffness and tinnitus.   Respiratory: Denies SOB, DOE, cough, chest tightness,  and wheezing.   Cardiovascular: Denies chest pain, palpitations and leg swelling.   Gastrointestinal: Denies nausea, vomiting, abdominal pain, diarrhea, constipation, blood in stool and abdominal distention.  Genitourinary: Denies dysuria, urgency, frequency, hematuria, flank pain and difficulty urinating.  Endocrine: Denies: hot or cold intolerance, sweats, changes in hair or nails, polyuria, polydipsia. Musculoskeletal: Denies myalgias, back pain, joint swelling, arthralgias and gait problem.  Skin: Denies pallor, rash and wound.  Neurological: Denies dizziness, seizures, syncope, weakness, light-headedness, numbness and headaches.  Hematological: Denies adenopathy. Easy bruising, personal or family bleeding history  Psychiatric/Behavioral: Denies suicidal ideation, mood changes, confusion, nervousness, sleep disturbance and agitation    Physical Exam: Vitals:   03/14/19 0733  BP: 130/80  Pulse: 91  Temp: 98.6 F (37 C)  TempSrc: Oral  SpO2: 95%  Weight: 169 lb 1.6 oz (76.7 kg)  Height: '5\' 1"'  (1.549 m)    Body mass index is 31.95 kg/m.   Constitutional: NAD, calm, comfortable Eyes: PERRL, lids and conjunctivae normal ENMT: Mucous membranes are moist. Posterior pharynx clear of any exudate or lesions. Normal dentition. Tympanic membrane is pearly white, no erythema or bulging. Neck: normal, supple, no masses, no thyromegaly Respiratory: clear to auscultation bilaterally, no wheezing, no crackles. Normal respiratory effort. No accessory muscle use.  Cardiovascular: Regular rate and rhythm, no murmurs / rubs / gallops. No extremity edema. 2+ pedal pulses. No carotid bruits.  Abdomen: no tenderness, no masses palpated. No hepatosplenomegaly. Bowel sounds positive.  Musculoskeletal: no clubbing / cyanosis. No joint deformity upper and lower extremities. Good ROM, no contractures. Normal muscle tone.  Skin: no rashes, lesions, ulcers. No induration Neurologic: CN 2-12 grossly intact. Sensation intact, DTR normal. Strength 5/5 in all 4.  Psychiatric: Normal  judgment and insight. Alert and oriented x 3. Normal mood.    Subsequent Medicare wellness visit   1. Risk factors, based on past  M,S,F -cardiovascular disease risk factors include hypertension and mild obesity only.   2.  Physical activities: She walks 3 miles a day usually does 3 to 4 hours of dance a week as well   3.  Depression/mood:  Not depressed   4.  Hearing:  No issues   5.  ADL's: Independent in all ADLs   6.  Fall risk:  Low fall risk   7.  Home safety: No problems identified   8.  Height weight, and visual acuity: Height and weight per chart, visual acuity 20/20 with corrective lenses as prescribed by ophthalmology   9.  Counseling:  Continue to discuss healthy diet and physical activity as mainstems of a healthy lifestyle   10. Lab orders based on risk factors: Laboratory update will be reviewed   11. Referral :  None   12. Care plan:  Continue medications, follow-up with orthopedics as needed in regards to her right ankle   13. Cognitive assessment:  No cognitive impairment   14. Screening: Patient provided with a written and personalized 5-10  year screening schedule in the AVS.   yes   15. Provider List Update:   PCP, ophthalmology, orthopedics  16. Advance Directives : Patient remains a full code    Office Visit from 03/14/2019 in [place] [facility] [place]  PHQ-9 Total Score  1      Fall Risk  03/14/2019 03/14/2019 09/08/2018 01/10/2018 10/07/2016  Falls in the past year? 0 0 1 No No  Comment - - - - -  Number falls in past yr: 0 0 0 - -  Comment - - - - -  Injury with Fall? 0 0 1 - -     Impression and Plan:  Encounter for Medicare annual wellness exam -Up-to-date on all health maintenance/cancer screening. -We will receive tetanus and shingles at drugstore. -Has routine eye and dental care. -Screening labs to be requested today.  Closed trimalleolar fracture of right ankle with routine healing, subsequent encounter -Follow-up with  orthopedics as needed -Will refill tramadol today.  Gastroesophageal reflux disease without esophagitis -Well-controlled, not on medications  Essential hypertension  -Well-controlled on hydrochlorothiazide/benazepril  Mild intermittent asthma, unspecified whether complicated -Controlled.    Patient Instructions  -Nice seeing your today!  -Lab work today. Will notify you when results are available.  -Shingles and tetanus immunizations at drug store.   Preventive Care 96 Years and Older, Female Preventive care refers to lifestyle choices and visits with your health care provider that can promote health and wellness. What does preventive care include?  A yearly physical exam. This is also called an annual well check.  Dental exams once or twice a year.  Routine eye exams. Ask your health care provider how often you should have your eyes checked.  Personal lifestyle choices, including: ? Daily care of your teeth and gums. ? Regular physical activity. ? Eating a healthy diet. ? Avoiding tobacco and drug use. ? Limiting alcohol use. ? Practicing safe sex. ? Taking low-dose aspirin every day. ? Taking vitamin and mineral supplements as recommended by your health care provider. What happens during an annual well check? The services and screenings done by your health care provider during your annual well check will depend on your age, overall health, lifestyle risk factors, and family history of disease. Counseling Your health care provider may ask you questions about your:  Alcohol use.  Tobacco use.  Drug use.  Emotional well-being.  Home and relationship well-being.  Sexual activity.  Eating habits.  History of falls.  Memory and ability to understand (cognition).  Work and work Statistician.  Reproductive health.  Screening You may have the following tests or measurements:  Height, weight, and BMI.  Blood pressure.  Lipid and cholesterol levels.  These may be checked every 5 years, or more frequently if you are over 74 years old.  Skin check.  Lung cancer screening. You may have this screening every year starting at age 36 if you have a 30-pack-year history of smoking and currently smoke or have quit within the past 15 years.  Colorectal cancer screening. All adults should have this screening starting at age 53 and continuing until age 42. You will have tests every 1-10 years, depending on your results and the type of screening test. People at increased risk should start screening at an earlier age. Screening tests may include: ? Guaiac-based fecal occult blood testing. ? Fecal immunochemical test (FIT). ? Stool DNA test. ? Virtual colonoscopy. ? Sigmoidoscopy. During this test, a flexible tube with a tiny camera (sigmoidoscope) is used  to examine your rectum and lower colon. The sigmoidoscope is inserted through your anus into your rectum and lower colon. ? Colonoscopy. During this test, a long, thin, flexible tube with a tiny camera (colonoscope) is used to examine your entire colon and rectum.  Hepatitis C blood test.  Hepatitis B blood test.  Sexually transmitted disease (STD) testing.  Diabetes screening. This is done by checking your blood sugar (glucose) after you have not eaten for a while (fasting). You may have this done every 1-3 years.  Bone density scan. This is done to screen for osteoporosis. You may have this done starting at age 68.  Mammogram. This may be done every 1-2 years. Talk to your health care provider about how often you should have regular mammograms. Talk with your health care provider about your test results, treatment options, and if necessary, the need for more tests. Vaccines Your health care provider may recommend certain vaccines, such as:  Influenza vaccine. This is recommended every year.  Tetanus, diphtheria, and acellular pertussis (Tdap, Td) vaccine. You may need a Td booster every 10  years.  Varicella vaccine. You may need this if you have not been vaccinated.  Zoster vaccine. You may need this after age 33.  Measles, mumps, and rubella (MMR) vaccine. You may need at least one dose of MMR if you were born in 1957 or later. You may also need a second dose.  Pneumococcal 13-valent conjugate (PCV13) vaccine. One dose is recommended after age 76.  Pneumococcal polysaccharide (PPSV23) vaccine. One dose is recommended after age 66.  Meningococcal vaccine. You may need this if you have certain conditions.  Hepatitis A vaccine. You may need this if you have certain conditions or if you travel or work in places where you may be exposed to hepatitis A.  Hepatitis B vaccine. You may need this if you have certain conditions or if you travel or work in places where you may be exposed to hepatitis B.  Haemophilus influenzae type b (Hib) vaccine. You may need this if you have certain conditions. Talk to your health care provider about which screenings and vaccines you need and how often you need them. This information is not intended to replace advice given to you by your health care provider. Make sure you discuss any questions you have with your health care provider. Document Released: 11/01/2015 Document Revised: 11/25/2017 Document Reviewed: 08/06/2015 Elsevier Interactive Patient Education  2019 [place], [place] [facility]

## 2019-03-14 NOTE — Addendum Note (Signed)
Addended by: Henderson Cloud on: 03/14/2019 08:11 AM   Modules accepted: Orders

## 2019-04-04 ENCOUNTER — Other Ambulatory Visit: Payer: Self-pay | Admitting: Internal Medicine

## 2019-04-04 DIAGNOSIS — J4520 Mild intermittent asthma, uncomplicated: Secondary | ICD-10-CM

## 2019-04-17 ENCOUNTER — Other Ambulatory Visit: Payer: Self-pay | Admitting: Internal Medicine

## 2019-04-17 ENCOUNTER — Ambulatory Visit: Payer: Self-pay

## 2019-04-17 NOTE — Telephone Encounter (Signed)
Pt called wanting to get an appointment to see about a cough she has had for the past three days. Held for NT for 2 minutes, no response. Pt states OK for someone to call her back. States her children are concerned she may have COVID.  Pt with h/o asthma and has been using inhaler, Mucinex, nasal spray,Wixela. Coughing began 2 days ago. Afebrile. Pt stated her children were worried about coronavirus.  Denies fever. Cough is productive. Phlegm is clear and moderate and worse at night.  No SOB, pt is wheezing but the inhaler and Wixela relieves the wheezing. Pt c/o throat itching and was sore 2 days ago. Taking Nyquil cold and flu, Mucinex DM. Pt stated that her throat is scratchy. Pt stated she has been checking her oxygen saturation and it has been 97-98%. Pt has h/o HTN and advised pt to refrain from using medication with dextromethorphan. Pt stated she has been checking her BP's and are running at her baseline. Advised pt to call 911 if the inhaler is not helping her wheezing, if she develops any SOB or chest pain. Pt stated that she feels good otherwise but wanted to appease her children and that is why she called the doctor.  Discussed care advice and she verbalized understanding.  Called to [place]  X 3 attempts and did not get answer.  Routing note to office so provider can review and call pt back in the morning.  Reason for Disposition . HIGH RISK patient (e.g., age > 30 years, diabetes, heart or lung disease, weak immune system)  Answer Assessment - Initial Assessment Questions 1. COVID-19 DIAGNOSIS: "Who made your Coronavirus (COVID-19) diagnosis?" "Was it confirmed by a positive lab test?" If not diagnosed by a HCP, ask "Are there lots of cases (community spread) where you live?" (See public health department website, if unsure)     Covid suspected 2. ONSET: "When did the COVID-19 symptoms start?"      2 days ago 3. WORST SYMPTOM: "What is your worst symptom?" (e.g., cough, fever,  shortness of breath, muscle aches)     Productive cough 4. COUGH: "Do you have a cough?" If so, ask: "How bad is the cough?"       Yes- worsens at night- pt stated that it is not bad but is having coughing episodes pt stated that she is having a lot of phlegm that is clear 5. FEVER: "Do you have a fever?" If so, ask: "What is your temperature, how was it measured, and when did it start?"     No-  6. RESPIRATORY STATUS: "Describe your breathing?" (e.g., shortness of breath, wheezing, unable to speak)     Wheezing but inhalers help the wheezing- wheezing has increased  7. BETTER-SAME-WORSE: "Are you getting better, staying the same or getting worse compared to yesterday?"  If getting worse, ask, "In what way?"     same 8. HIGH RISK DISEASE: "Do you have any chronic medical problems?" (e.g., asthma, heart or lung disease, weak immune system, etc.)     Asthma, HTN,  9. PREGNANCY: "Is there any chance you are pregnant?" "When was your last menstrual period?"     n/a 10. OTHER SYMPTOMS: "Do you have any other symptoms?"  (e.g., chills, fatigue, headache, loss of smell or taste, muscle pain, sore throat)       Scratchy throat, slight headache  Protocols used: CORONAVIRUS (COVID-19) DIAGNOSED OR SUSPECTED-A-AH

## 2019-04-18 NOTE — Telephone Encounter (Signed)
Please schedule for virtual visit

## 2019-04-19 ENCOUNTER — Emergency Department (HOSPITAL_COMMUNITY): Payer: Medicare Other

## 2019-04-19 ENCOUNTER — Other Ambulatory Visit: Payer: Self-pay

## 2019-04-19 ENCOUNTER — Ambulatory Visit (INDEPENDENT_AMBULATORY_CARE_PROVIDER_SITE_OTHER): Payer: Medicare Other | Admitting: Internal Medicine

## 2019-04-19 ENCOUNTER — Encounter (HOSPITAL_COMMUNITY): Payer: Self-pay

## 2019-04-19 ENCOUNTER — Emergency Department (HOSPITAL_COMMUNITY)
Admission: EM | Admit: 2019-04-19 | Discharge: 2019-04-19 | Disposition: A | Discharge status: Home / Self Care | Payer: Medicare Other | Attending: Emergency Medicine | Admitting: Emergency Medicine

## 2019-04-19 DIAGNOSIS — I10 Essential (primary) hypertension: Secondary | ICD-10-CM

## 2019-04-19 DIAGNOSIS — J069 Acute upper respiratory infection, unspecified: Secondary | ICD-10-CM | POA: Diagnosis not present

## 2019-04-19 DIAGNOSIS — Z20828 Contact with and (suspected) exposure to other viral communicable diseases: Secondary | ICD-10-CM | POA: Insufficient documentation

## 2019-04-19 DIAGNOSIS — J45909 Unspecified asthma, uncomplicated: Secondary | ICD-10-CM | POA: Diagnosis not present

## 2019-04-19 DIAGNOSIS — Z79899 Other long term (current) drug therapy: Secondary | ICD-10-CM | POA: Diagnosis not present

## 2019-04-19 DIAGNOSIS — Z87891 Personal history of nicotine dependence: Secondary | ICD-10-CM | POA: Insufficient documentation

## 2019-04-19 DIAGNOSIS — R05 Cough: Secondary | ICD-10-CM | POA: Diagnosis present

## 2019-04-19 DIAGNOSIS — I160 Hypertensive urgency: Secondary | ICD-10-CM | POA: Diagnosis not present

## 2019-04-19 DIAGNOSIS — B9789 Other viral agents as the cause of diseases classified elsewhere: Secondary | ICD-10-CM | POA: Diagnosis not present

## 2019-04-19 LAB — CBC WITH DIFFERENTIAL/PLATELET
Abs Immature Granulocytes: 0.01 10*3/uL (ref 0.00–0.07)
Basophils Absolute: 0 10*3/uL (ref 0.0–0.1)
Basophils Relative: 0 %
Eosinophils Absolute: 0.3 10*3/uL (ref 0.0–0.5)
Eosinophils Relative: 3 %
HCT: 39.8 % (ref 36.0–46.0)
Hemoglobin: 12.9 g/dL (ref 12.0–15.0)
Immature Granulocytes: 0 %
Lymphocytes Relative: 37 %
Lymphs Abs: 3.5 10*3/uL (ref 0.7–4.0)
MCH: 30.3 pg (ref 26.0–34.0)
MCHC: 32.4 g/dL (ref 30.0–36.0)
MCV: 93.4 fL (ref 80.0–100.0)
Monocytes Absolute: 0.7 10*3/uL (ref 0.1–1.0)
Monocytes Relative: 7 %
Neutro Abs: 4.9 10*3/uL (ref 1.7–7.7)
Neutrophils Relative %: 53 %
Platelets: 321 10*3/uL (ref 150–400)
RBC: 4.26 MIL/uL (ref 3.87–5.11)
RDW: 12.9 % (ref 11.5–15.5)
WBC: 9.3 10*3/uL (ref 4.0–10.5)
nRBC: 0 % (ref 0.0–0.2)

## 2019-04-19 LAB — TROPONIN I (HIGH SENSITIVITY): Troponin I (High Sensitivity): 5 ng/L (ref <18)

## 2019-04-19 LAB — COMPREHENSIVE METABOLIC PANEL
ALT: 14 U/L (ref 0–44)
AST: 16 U/L (ref 15–41)
Albumin: 4.1 g/dL (ref 3.5–5.0)
Alkaline Phosphatase: 49 U/L (ref 38–126)
Anion gap: 9 (ref 5–15)
BUN: 15 mg/dL (ref 8–23)
CO2: 29 mmol/L (ref 22–32)
Calcium: 9.7 mg/dL (ref 8.9–10.3)
Chloride: 103 mmol/L (ref 98–111)
Creatinine, Ser: 0.69 mg/dL (ref 0.44–1.00)
GFR calc Af Amer: >60 mL/min (ref >60)
GFR calc non Af Amer: >60 mL/min (ref >60)
Glucose, Bld: 96 mg/dL (ref 70–99)
Potassium: 3 mmol/L — ABNORMAL LOW (ref 3.5–5.1)
Sodium: 141 mmol/L (ref 135–145)
Total Bilirubin: 0.3 mg/dL (ref 0.3–1.2)
Total Protein: 7.5 g/dL (ref 6.5–8.1)

## 2019-04-19 MED ORDER — ACETAMINOPHEN 325 MG PO TABS
650.0000 mg | ORAL_TABLET | Freq: Once | ORAL | Status: AC
  Administered 2019-04-19: 21:00:00 650 mg via ORAL
  Filled 2019-04-19: qty 2

## 2019-04-19 MED ORDER — POTASSIUM CHLORIDE CRYS ER 20 MEQ PO TBCR
40.0000 meq | EXTENDED_RELEASE_TABLET | Freq: Once | ORAL | Status: AC
  Administered 2019-04-19: 21:00:00 40 meq via ORAL
  Filled 2019-04-19: qty 2

## 2019-04-19 NOTE — Progress Notes (Signed)
Virtual Visit via Video Note  I connected with Kristy [name] on 04/19/19 at  3:00 PM EDT by a video enabled telemedicine application and verified that I am speaking with the correct person using two identifiers.  Location patient: home Location provider: work office Persons participating in the virtual visit: patient, provider  I discussed the limitations of evaluation and management by telemedicine and the availability of in person appointments. The patient expressed understanding and agreed to proceed.   HPI: She has made this visit to discuss potential COVID testing. She has been relatively well isolated at home thruout the pandemic leaving the house only for essentials. 3 days ago started having a severe frontal HA with some dizziness, maybe a little sore throat. No SOB or fever.  As she has a h/o HTN, I have asked her to check her BP while she is video conferencing with me. Measurements: 205/145, 215/140, twice other times with an "error". She has not skipped any doses of her BP meds. Denies blurry vision or focal deficits, CP or SOB.   ROS: Constitutional: Denies fever, chills, diaphoresis, appetite change and fatigue.  HEENT: Denies photophobia, eye pain, redness, hearing loss, ear pain, congestion, sore throat, rhinorrhea, sneezing, mouth sores, trouble swallowing, neck pain, neck stiffness and tinnitus.   Respiratory: Denies SOB, DOE, cough, chest tightness,  and wheezing.   Cardiovascular: Denies chest pain, palpitations and leg swelling.  Gastrointestinal: Denies nausea, vomiting, abdominal pain, diarrhea, constipation, blood in stool and abdominal distention.  Genitourinary: Denies dysuria, urgency, frequency, hematuria, flank pain and difficulty urinating.  Endocrine: Denies: hot or cold intolerance, sweats, changes in hair or nails, polyuria, polydipsia. Musculoskeletal: Denies myalgias, back pain, joint swelling, arthralgias and gait problem.  Skin: Denies pallor, rash  and wound.  Neurological: Denies , seizures, syncope, weakness, l numbness. Hematological: Denies adenopathy. Easy bruising, personal or family bleeding history  Psychiatric/Behavioral: Denies suicidal ideation, mood changes, confusion, nervousness, sleep disturbance and agitation   Past Medical History:  Diagnosis Date  . Allergic rhinitis, seasonal   . Arthritis   . Asthma   . GERD (gastroesophageal reflux disease)   . History of colon polyps   . Hypertension   . Rotator cuff tear, right     Past Surgical History:  Procedure Laterality Date  . CHOLECYSTOMY OPEN  AGE 78  . INGUINAL HERNIA REPAIR Right AGE 78s  . KNEE ARTHROSCOPY Left 2014  . LUMBAR [place] SURGERY  x2  LAST DATE EARLY 2000s  . ORIF ANKLE FRACTURE Right 06/24/2018   Procedure: OPEN REDUCTION INTERNAL FIXATION (ORIF) RIGHT ANKLE FRACTURE;  Surgeon: Mcarthur Rossetti, MD;  Location: [facility];  Service: Orthopedics;  Laterality: Right;  . ROTATOR CUFF REPAIR Left 01/2015  . TONSILLECTOMY  AGE 78  . VAGINAL HYSTERECTOMY  AGE 78   WITH BSO    Family History  Problem Relation Age of Onset  . Colon cancer Neg Hx   . Esophageal cancer Neg Hx   . Rectal cancer Neg Hx   . Stomach cancer Neg Hx     SOCIAL HX:   reports that she quit smoking about 30 years ago. She quit after 10.00 years of use. She has never used smokeless tobacco. She reports current alcohol use of about 3.0 - 4.0 standard drinks of alcohol per week. She reports that she does not use drugs.   Current Outpatient Medications:  .  albuterol (VENTOLIN HFA) 108 (90 Base) MCG/ACT inhaler, INHALE 2 PUFFS BY MOUTH EVERY 6 HOURS  AS NEEDED FOR WHEEZE, Disp: 6.7 Inhaler, Rfl: 0 .  benazepril (LOTENSIN) 40 MG tablet, Take 1 tablet (40 mg total) by mouth daily., Disp: 90 tablet, Rfl: 1 .  celecoxib (CELEBREX) 200 MG capsule, TAKE 1 CAPSULE BY MOUTH EVERY DAY, Disp: 90 capsule, Rfl: 1 .  COLLAGEN PO, Take by mouth. Collagen powder-mix with coffee and drink  daily, Disp: , Rfl:  .  fluticasone (FLONASE) 50 MCG/ACT nasal spray, SPRAY 2 SPRAYS INTO EACH NOSTRIL EVERY DAY, Disp: 48 g, Rfl: 0 .  hydrochlorothiazide (HYDRODIURIL) 25 MG tablet, TAKE 1 TABLET BY MOUTH EVERY DAY, Disp: 90 tablet, Rfl: 3 .  Liniments (SALONPAS PAIN RELIEF PATCH EX), Apply 1 patch topically daily as needed (pain)., Disp: , Rfl:  .  Menthol, Topical Analgesic, (BLUE-EMU MAXIMUM STRENGTH EX), Apply 1 application topically daily., Disp: , Rfl:  .  traMADol (ULTRAM) 50 MG tablet, Take 1 tablet (50 mg total) by mouth every 12 (twelve) hours as needed., Disp: 60 tablet, Rfl: 0 .  WIXELA INHUB 250-50 MCG/DOSE AEPB, INHALE 1 PUFF BY MOUTH EVERY 12 HOURS, Disp: 180 each, Rfl: 1  EXAM:   VITALS per patient if applicable: BP as above  GENERAL: alert, oriented, appears well and in no acute distress  HEENT: atraumatic, conjunttiva clear, no obvious abnormalities on inspection of external nose and ears  NECK: normal movements of the head and neck  LUNGS: on inspection no signs of respiratory distress, breathing rate appears normal, no obvious gross increased work of breathing, gasping or wheezing  CV: no obvious cyanosis  MS: moves all visible extremities without noticeable abnormality  PSYCH/NEURO: pleasant and cooperative, no obvious depression or anxiety, speech and thought processing grossly intact  ASSESSMENT AND PLAN:   Hypertensive urgency -I am concerned about hypertensive urgency/emergency given her current BP measurements at home coupled with her severe HA and dizziness. No focal neurological deficits per her description. -I have STRONGLY recommended ED evaluation tonight as this is a potential organ/life threatening situation; she is hesitant to go and wonders if she can take an extra dose of lisinopril instead. My recommendation remains ED evaluation tonight. -Not concerned for COVID at this time and do not believe testing is needed based on her presentation.    I  discussed the assessment and treatment plan with the patient. The patient was provided an opportunity to ask questions and all were answered. The patient agreed with the plan and demonstrated an understanding of the instructions.   The patient was advised to call back or seek an in-person evaluation if the symptoms worsen or if the condition fails to improve as anticipated.   Chaya JanEstela Hernandez Acosta, MD [facility]

## 2019-04-19 NOTE — ED Provider Notes (Signed)
[facility]-EMERGENCY DEPT Provider Note   CSN: [id number] Arrival date & time: 04/19/19  1628    History   Chief Complaint Chief Complaint  Patient presents with  . Hypertension    HPI Kristy [name] is a 78 y.o. female with a PMH of asthma, hypertension, GERD who presented with cough, shortness of breath, sore throat, and elevated blood pressures.  She reports that on Friday, 6/26, she began to experience a sore throat and developed some shortness of breath.  She also has a cough and difficulty talking because her cough is so bad at times.  She denies fever.  She says that she has not spent time with many people except for her teenage grandsons recently.  She does go to the grocery store but she wears her mask at all times in public.  She says that she was working outside over the weekend, so she wondered if part of her shortness of breath was due to her allergies.  She has tried Mucinex, Careers adviserAllegra, and continue to take her fluticasone/salmeterol daily as well as her albuterol as needed.  She has taken her albuterol twice today.  She also notes that her blood pressure has been elevated over the past 2 days.  She took double her usual dose of hydrochlorothiazide and benazepril but this did not seem to help, since her blood pressure was elevated to 200/100 during her telemedicine visit with her PCP earlier today.  Her PCP advised her to go to the emergency department for evaluation of her high blood pressure.  She denies changes in vision but does endorse a headache.    Past Medical History:  Diagnosis Date  . Allergic rhinitis, seasonal   . Arthritis   . Asthma   . GERD (gastroesophageal reflux disease)   . History of colon polyps   . Hypertension   . Rotator cuff tear, right     Patient Active Problem List   Diagnosis Date Noted  . IGT (impaired glucose tolerance) 03/14/2019  . Acute labyrinthitis, unspecified laterality 09/08/2018  . Trimalleolar fracture of  right ankle 06/24/2018  . Closed trimalleolar fracture of right ankle 06/21/2018  . Osteoarthritis 09/22/2012  . UNSPECIFIED DISORDER TEETH&SUPPORTING STRUCTURES 09/09/2010  . Essential hypertension 04/07/2007  . Allergic rhinitis 04/07/2007  . Asthma 04/07/2007  . GERD 04/07/2007  . COLONIC POLYPS, HX OF 04/07/2007    Past Surgical History:  Procedure Laterality Date  . CHOLECYSTECTOMY OPEN  AGE 27  . INGUINAL HERNIA REPAIR Right AGE 27s  . KNEE ARTHROSCOPY Left 2014  . LUMBAR [place] SURGERY  x2  LAST DATE EARLY 2000s  . ORIF ANKLE FRACTURE Right 06/24/2018   Procedure: OPEN REDUCTION INTERNAL FIXATION (ORIF) RIGHT ANKLE FRACTURE;  Surgeon: Kathryne HitchBlackman, Christopher Y, MD;  Location: [facility];  Service: Orthopedics;  Laterality: Right;  . ROTATOR CUFF REPAIR Left 01/2015  . TONSILLECTOMY  AGE 35  . VAGINAL HYSTERECTOMY  AGE 1   WITH BSO     OB History   No obstetric history on file.      Home Medications    Prior to Admission medications   Medication Sig Start Date End Date Taking? Authorizing Provider  albuterol (VENTOLIN HFA) 108 (90 Base) MCG/ACT inhaler INHALE 2 PUFFS BY MOUTH EVERY 6 HOURS AS NEEDED FOR WHEEZE Patient taking differently: Inhale 2 puffs into the lungs every 6 (six) hours as needed for wheezing.  03/03/19  Yes Philip AspenHernandez Acosta, Limmie PatriciaEstela Y, MD  benazepril (LOTENSIN) 40 MG tablet Take 1  tablet (40 mg total) by mouth daily. 12/14/18  Yes Philip AspenHernandez Acosta, Limmie PatriciaEstela Y, MD  celecoxib (CELEBREX) 200 MG capsule TAKE 1 CAPSULE BY MOUTH EVERY DAY Patient taking differently: Take 200 mg by mouth daily.  04/17/19  Yes Henderson CloudHernandez Acosta, Estela Y, MD  COLLAGEN PO Take by mouth. Collagen powder-mix with coffee and drink daily   Yes [provider]  fluticasone (FLONASE) 50 MCG/ACT nasal spray SPRAY 2 SPRAYS INTO EACH NOSTRIL EVERY DAY Patient taking differently: Place 2 sprays into both nostrils daily.  03/14/19  Yes Philip AspenHernandez Acosta, Limmie PatriciaEstela Y, MD  hydrochlorothiazide  (HYDRODIURIL) 25 MG tablet TAKE 1 TABLET BY MOUTH EVERY DAY Patient taking differently: Take 25 mg by mouth daily. TAKE 1 TABLET BY MOUTH EVERY DAY 07/27/18  Yes Roderick Peeodd, Jeffrey A, MD  Liniments [facility](SALONPAS PAIN RELIEF PATCH EX) Apply 1 patch topically daily as needed (pain).   Yes [provider]  Menthol, Topical Analgesic, (BLUE-EMU MAXIMUM STRENGTH EX) Apply 1 application topically daily.   Yes [provider]  traMADol (ULTRAM) 50 MG tablet Take 1 tablet (50 mg total) by mouth every 12 (twelve) hours as needed. 03/14/19  Yes Philip AspenHernandez Acosta, Limmie PatriciaEstela Y, MD  WIXELA INHUB 250-50 MCG/DOSE AEPB INHALE 1 PUFF BY MOUTH EVERY 12 HOURS Patient taking differently: Inhale 1 puff into the lungs every 12 (twelve) hours.  04/04/19  Yes Henderson CloudHernandez Acosta, Estela Y, MD    Family History Family History  Problem Relation Age of Onset  . Colon cancer Neg Hx   . Esophageal cancer Neg Hx   . Rectal cancer Neg Hx   . Stomach cancer Neg Hx     Social History Social History   Tobacco Use  . Smoking status: Former Smoker    Years: 10.00    Quit date: 10/19/1988    Years since quitting: 30.5  . Smokeless tobacco: Never Used  Substance Use Topics  . Alcohol use: Yes    Alcohol/week: 3.0 - 4.0 standard drinks    Types: 3 - 4 Glasses of wine per week  . Drug use: Never     Allergies   Clonidine   Review of Systems Review of Systems  Constitutional: Negative for activity change, appetite change, chills and fever.  HENT: Positive for sore throat. Negative for congestion.   Eyes: Negative for visual disturbance.  Respiratory: Positive for cough. Negative for chest tightness.   Cardiovascular: Negative for chest pain and palpitations.  Gastrointestinal: Negative for abdominal pain.  Genitourinary: Negative for flank pain.  Neurological: Positive for headaches.  Psychiatric/Behavioral: The patient is not nervous/anxious.      Physical Exam Updated Vital Signs BP (!) 185/103   Pulse  77   Temp 98 F (36.7 C) (Oral)   Resp 19   Ht 5\' 2"  (1.575 m)   Wt 72.6 kg   SpO2 99%   BMI 29.26 kg/m   Physical Exam Constitutional:      General: She is not in acute distress.    Appearance: Normal appearance.  HENT:     Head: Normocephalic and atraumatic.     Right Ear: Tympanic membrane normal.     Left Ear: Tympanic membrane normal.     Nose: Nose normal. No congestion or rhinorrhea.     Mouth/Throat:     Mouth: Mucous membranes are moist.     Pharynx: No oropharyngeal exudate or posterior oropharyngeal erythema.  Eyes:     Extraocular Movements: Extraocular movements intact.     Conjunctiva/sclera: Conjunctivae normal.  Pupils: Pupils are equal, round, and reactive to light.  Neck:     Musculoskeletal: Normal range of motion.  Cardiovascular:     Rate and Rhythm: Normal rate and regular rhythm.     Pulses: Normal pulses.  Pulmonary:     Effort: Pulmonary effort is normal. No respiratory distress.     Breath sounds: Wheezing (scattered, minimal) present.  Abdominal:     General: Abdomen is flat.     Palpations: Abdomen is soft.  Musculoskeletal: Normal range of motion.        General: No swelling.     Right lower leg: No edema.  Skin:    General: Skin is warm and dry.  Neurological:     General: No focal deficit present.     Mental Status: She is alert and oriented to person, place, and time.  Psychiatric:        Mood and Affect: Mood normal.        Behavior: Behavior normal.      ED Treatments / Results  Labs (all labs ordered are listed, but only abnormal results are displayed) Labs Reviewed  COMPREHENSIVE METABOLIC PANEL - Abnormal; Notable for the following components:      Result Value   Potassium 3.0 (*)    All other components within normal limits  NOVEL CORONAVIRUS, NAA (HOSPITAL ORDER, SEND-OUT TO REF LAB)  CBC WITH DIFFERENTIAL/PLATELET  TROPONIN I (HIGH SENSITIVITY)  TROPONIN I (HIGH SENSITIVITY)    EKG EKG Interpretation   Date/Time:  Wednesday April 19 2019 19:01:27 EDT Ventricular Rate:  76 PR Interval:    QRS Duration: 93 QT Interval:  392 QTC Calculation: 441 R Axis:   35 Text Interpretation:  Sinus rhythm Multiple ventricular premature complexes Sinus pause Abnormal R-wave progression, early transition Borderline T wave abnormalities No STEMI  Confirmed by Alona BeneLong, Joshua [phone]([id number]) on 04/19/2019 7:06:55 PM   Radiology Dg Chest 2 View  Result Date: 04/19/2019 CLINICAL DATA:  78 year old female with history of high blood pressure. EXAM: CHEST - 2 VIEW COMPARISON:  Chest x-ray 11/11/2016. FINDINGS: Lung volumes are normal. No consolidative airspace disease. No pleural effusions. No pneumothorax. No pulmonary nodule or mass noted. Pulmonary vasculature and the cardiomediastinal silhouette are within normal limits. IMPRESSION: No radiographic evidence of acute cardiopulmonary disease. Electronically Signed   By: Trudie Reedaniel  Entrikin M.D.   On: 04/19/2019 19:37    Procedures Procedures (including critical care time)  Medications Ordered in ED Medications  potassium chloride SA (K-DUR) CR tablet 40 mEq (has no administration in time range)  acetaminophen (TYLENOL) tablet 650 mg (has no administration in time range)     Initial Impression / Assessment and Plan / ED Course  I have reviewed the triage vital signs and the nursing notes.  Pertinent labs & imaging results that were available during my care of the patient were reviewed by me and considered in my medical decision making (see chart for details).  Clinical Course as of Apr 19 2031  Wed Apr 19, 2019  1920 Troponin I (High Sensitivity) [AW]    Clinical Course User Index [AW] Lennox SoldersWinfrey,  C, MD       Moderate concern for COVID given symptoms of sore throat, cough, shortness of breath, although lack of fever is reassuring.  Will order CXR and send out test.  Will also order CBC, CMP, and troponin.  Patient's blood pressure is perhaps elevated due to  her current illness.  The above work-up was unremarkable.  Patient was advised that  she will be called if her COVID send out test is positive, but she also has MyChart, so she will keep track of it there as well.  She was advised to contact her PCP in the morning to see about adjusting her blood pressure medications to address her elevated blood pressures.  Patient was felt to be safe for discharge.  Final Clinical Impressions(s) / ED Diagnoses   Final diagnoses:  Essential hypertension  Viral upper respiratory illness    ED Discharge Orders    None       Kathrene Alu, MD 04/19/19 2032    Margette Fast, MD 04/20/19 1139

## 2019-04-19 NOTE — ED Notes (Signed)
Patient transported to X-ray 

## 2019-04-19 NOTE — ED Notes (Signed)
ED Provider at bedside. 

## 2019-04-19 NOTE — ED Triage Notes (Signed)
Pt c/o sore throat and headache. Pt did virtual visit and had HTN 215/140, 188/109

## 2019-04-19 NOTE — Discharge Instructions (Signed)
Your workup, including blood count, electrolytes, chest x-ray, and a test for heart damage, was normal today.  Your COVID test result will populate in MyChart in the next day or two, and you will receive a call if it is positive.  Please call your PCP tomorrow morning to discuss adjusting your blood pressure medications to address your elevated pressures.

## 2019-04-20 ENCOUNTER — Ambulatory Visit (INDEPENDENT_AMBULATORY_CARE_PROVIDER_SITE_OTHER): Payer: Medicare Other | Admitting: Internal Medicine

## 2019-04-20 DIAGNOSIS — E876 Hypokalemia: Secondary | ICD-10-CM | POA: Diagnosis not present

## 2019-04-20 DIAGNOSIS — I10 Essential (primary) hypertension: Secondary | ICD-10-CM

## 2019-04-20 DIAGNOSIS — Z09 Encounter for follow-up examination after completed treatment for conditions other than malignant neoplasm: Secondary | ICD-10-CM

## 2019-04-20 MED ORDER — POTASSIUM CHLORIDE CRYS ER 20 MEQ PO TBCR: 20.0000 meq | EXTENDED_RELEASE_TABLET | Freq: Every day | ORAL | 0 refills | Status: DC

## 2019-04-20 MED ORDER — AMLODIPINE BESYLATE 10 MG PO TABS: 10.0000 mg | ORAL_TABLET | Freq: Every day | ORAL | 1 refills | Status: DC

## 2019-04-20 NOTE — Progress Notes (Signed)
Virtual Visit via Video Note  I connected with Kristy [name] on 04/20/19 at 10:00 AM EDT by a video enabled telemedicine application and verified that I am speaking with the correct person using two identifiers.  Location patient: home Location provider: work office Persons participating in the virtual visit: patient, provider  I discussed the limitations of evaluation and management by telemedicine and the availability of in person appointments. The patient expressed understanding and agreed to proceed.   HPI: ED follow up  After our video visit yesterday I recommended ED evaluation given her severe frontal HA and BP of >200/140. She also had some mild URI symptoms. COVID test was done and remains pending. Her BP was 180/110. CXR and lab work was ok (with the exception of a K of 3.0) so she was discharged and asked to follow up with me today. It does not appear she was given any BP meds. She is on HCTZ 25 and Benazepril 40. She decided to take an extra benazepril last night. BP today is 178/111 with a HR of 78. HA is improved.   ROS: Constitutional: Denies fever, chills, diaphoresis, appetite change and fatigue.  HEENT: Denies photophobia, eye pain, redness, hearing loss, ear pain, congestion, sore throat, rhinorrhea, sneezing, mouth sores, trouble swallowing, neck pain, neck stiffness and tinnitus.   Respiratory: Denies SOB, DOE, cough, chest tightness,  and wheezing.   Cardiovascular: Denies chest pain, palpitations and leg swelling.  Gastrointestinal: Denies nausea, vomiting, abdominal pain, diarrhea, constipation, blood in stool and abdominal distention.  Genitourinary: Denies dysuria, urgency, frequency, hematuria, flank pain and difficulty urinating.  Endocrine: Denies: hot or cold intolerance, sweats, changes in hair or nails, polyuria, polydipsia. Musculoskeletal: Denies myalgias, back pain, joint swelling, arthralgias and gait problem.  Skin: Denies pallor, rash and wound.   Neurological: Denies dizziness, seizures, syncope, weakness, light-headedness, numbness and headaches.  Hematological: Denies adenopathy. Easy bruising, personal or family bleeding history  Psychiatric/Behavioral: Denies suicidal ideation, mood changes, confusion, nervousness, sleep disturbance and agitation   Past Medical History:  Diagnosis Date  . Allergic rhinitis, seasonal   . Arthritis   . Asthma   . GERD (gastroesophageal reflux disease)   . History of colon polyps   . Hypertension   . Rotator cuff tear, right     Past Surgical History:  Procedure Laterality Date  . CHOLECYSTECTOMY OPEN  AGE 89  . INGUINAL HERNIA REPAIR Right AGE 89s  . KNEE ARTHROSCOPY Left 2014  . LUMBAR DISC SURGERY  x2  LAST DATE EARLY 2000s  . ORIF ANKLE FRACTURE Right 06/24/2018   Procedure: OPEN REDUCTION INTERNAL FIXATION (ORIF) RIGHT ANKLE FRACTURE;  Surgeon: Kathryne HitchBlackman, Christopher Y, MD;  Location: [facility];  Service: Orthopedics;  Laterality: Right;  . ROTATOR CUFF REPAIR Left 01/2015  . TONSILLECTOMY  AGE 78  . VAGINAL HYSTERECTOMY  AGE 19   WITH BSO    Family History  Problem Relation Age of Onset  . Colon cancer Neg Hx   . Esophageal cancer Neg Hx   . Rectal cancer Neg Hx   . Stomach cancer Neg Hx     SOCIAL HX:   reports that she quit smoking about 30 years ago. She quit after 10.00 years of use. She has never used smokeless tobacco. She reports current alcohol use of about 3.0 - 4.0 standard drinks of alcohol per week. She reports that she does not use drugs.   Current Outpatient Medications:  .  albuterol (VENTOLIN HFA) 108 (90 Base) MCG/ACT  inhaler, INHALE 2 PUFFS BY MOUTH EVERY 6 HOURS AS NEEDED FOR WHEEZE (Patient taking differently: Inhale 2 puffs into the lungs every 6 (six) hours as needed for wheezing. ), Disp: 6.7 Inhaler, Rfl: 0 .  amLODipine (NORVASC) 10 MG tablet, Take 1 tablet (10 mg total) by mouth daily., Disp: 90 tablet, Rfl: 1 .  benazepril (LOTENSIN) 40 MG tablet, Take  1 tablet (40 mg total) by mouth daily., Disp: 90 tablet, Rfl: 1 .  celecoxib (CELEBREX) 200 MG capsule, TAKE 1 CAPSULE BY MOUTH EVERY DAY (Patient taking differently: Take 200 mg by mouth daily. ), Disp: 90 capsule, Rfl: 1 .  COLLAGEN PO, Take by mouth. Collagen powder-mix with coffee and drink daily, Disp: , Rfl:  .  fluticasone (FLONASE) 50 MCG/ACT nasal spray, SPRAY 2 SPRAYS INTO EACH NOSTRIL EVERY DAY (Patient taking differently: Place 2 sprays into both nostrils daily. ), Disp: 48 g, Rfl: 0 .  hydrochlorothiazide (HYDRODIURIL) 25 MG tablet, TAKE 1 TABLET BY MOUTH EVERY DAY (Patient taking differently: Take 25 mg by mouth daily. TAKE 1 TABLET BY MOUTH EVERY DAY), Disp: 90 tablet, Rfl: 3 .  Liniments (SALONPAS PAIN RELIEF PATCH EX), Apply 1 patch topically daily as needed (pain)., Disp: , Rfl:  .  Menthol, Topical Analgesic, (BLUE-EMU MAXIMUM STRENGTH EX), Apply 1 application topically daily., Disp: , Rfl:  .  potassium chloride SA (K-DUR) 20 MEQ tablet, Take 1 tablet (20 mEq total) by mouth daily for 3 days., Disp: 3 tablet, Rfl: 0 .  traMADol (ULTRAM) 50 MG tablet, Take 1 tablet (50 mg total) by mouth every 12 (twelve) hours as needed., Disp: 60 tablet, Rfl: 0 .  WIXELA INHUB 250-50 MCG/DOSE AEPB, INHALE 1 PUFF BY MOUTH EVERY 12 HOURS (Patient taking differently: Inhale 1 puff into the lungs every 12 (twelve) hours. ), Disp: 180 each, Rfl: 1  EXAM:   VITALS per patient if applicable: BP 315/40 HR 78  GENERAL: alert, oriented, appears well and in no acute distress  HEENT: atraumatic, conjunttiva clear, no obvious abnormalities on inspection of external nose and ears  NECK: normal movements of the head and neck  LUNGS: on inspection no signs of respiratory distress, breathing rate appears normal, no obvious gross increased work of breathing, gasping or wheezing  CV: no obvious cyanosis  MS: moves all visible extremities without noticeable abnormality  PSYCH/NEURO: pleasant and  cooperative, no obvious depression or anxiety, speech and thought processing grossly intact  ASSESSMENT AND PLAN:   Hospital discharge follow-up Essential hypertension  Hypokalemia  -Add amlodipine 10 mg. -KDur 20 meq x 3 days. -Recheck K at next in-person visit. -F/u for BP management in 6 weeks.     I discussed the assessment and treatment plan with the patient. The patient was provided an opportunity to ask questions and all were answered. The patient agreed with the plan and demonstrated an understanding of the instructions.   The patient was advised to call back or seek an in-person evaluation if the symptoms worsen or if the condition fails to improve as anticipated.    Lelon Frohlich, MD  [facility] at [facility]

## 2019-04-21 LAB — NOVEL CORONAVIRUS, NAA (HOSP ORDER, SEND-OUT TO REF LAB; TAT 18-24 HRS): SARS-CoV-2, NAA: NOT DETECTED

## 2019-04-25 ENCOUNTER — Telehealth (HOSPITAL_COMMUNITY): Payer: Self-pay

## 2019-05-23 DIAGNOSIS — H25013 Cortical age-related cataract, bilateral: Secondary | ICD-10-CM | POA: Diagnosis not present

## 2019-05-23 DIAGNOSIS — H2513 Age-related nuclear cataract, bilateral: Secondary | ICD-10-CM | POA: Diagnosis not present

## 2019-05-23 DIAGNOSIS — H1851 Endothelial corneal dystrophy: Secondary | ICD-10-CM | POA: Diagnosis not present

## 2019-05-23 DIAGNOSIS — H5203 Hypermetropia, bilateral: Secondary | ICD-10-CM | POA: Diagnosis not present

## 2019-06-08 DIAGNOSIS — H25012 Cortical age-related cataract, left eye: Secondary | ICD-10-CM | POA: Diagnosis not present

## 2019-06-08 DIAGNOSIS — H25812 Combined forms of age-related cataract, left eye: Secondary | ICD-10-CM | POA: Diagnosis not present

## 2019-06-08 DIAGNOSIS — H2512 Age-related nuclear cataract, left eye: Secondary | ICD-10-CM | POA: Diagnosis not present

## 2019-06-09 ENCOUNTER — Other Ambulatory Visit: Payer: Self-pay | Admitting: Internal Medicine

## 2019-06-22 DIAGNOSIS — H25011 Cortical age-related cataract, right eye: Secondary | ICD-10-CM | POA: Diagnosis not present

## 2019-06-22 DIAGNOSIS — H2511 Age-related nuclear cataract, right eye: Secondary | ICD-10-CM | POA: Diagnosis not present

## 2019-06-22 DIAGNOSIS — H25811 Combined forms of age-related cataract, right eye: Secondary | ICD-10-CM | POA: Diagnosis not present

## 2019-06-28 ENCOUNTER — Other Ambulatory Visit: Payer: Self-pay | Admitting: Internal Medicine

## 2019-06-28 DIAGNOSIS — J4520 Mild intermittent asthma, uncomplicated: Secondary | ICD-10-CM

## 2019-07-05 DIAGNOSIS — Z23 Encounter for immunization: Secondary | ICD-10-CM | POA: Diagnosis not present

## 2019-07-26 ENCOUNTER — Other Ambulatory Visit: Payer: Self-pay | Admitting: Internal Medicine

## 2019-07-26 DIAGNOSIS — I10 Essential (primary) hypertension: Secondary | ICD-10-CM

## 2019-07-26 DIAGNOSIS — S82851D Displaced trimalleolar fracture of right lower leg, subsequent encounter for closed fracture with routine healing: Secondary | ICD-10-CM

## 2019-07-26 NOTE — Telephone Encounter (Signed)
Routing to PCP to approve tramadol

## 2019-08-10 DIAGNOSIS — M25561 Pain in right knee: Secondary | ICD-10-CM | POA: Diagnosis not present

## 2019-08-18 DIAGNOSIS — M25561 Pain in right knee: Secondary | ICD-10-CM | POA: Diagnosis not present

## 2019-08-28 DIAGNOSIS — M150 Primary generalized (osteo)arthritis: Secondary | ICD-10-CM | POA: Diagnosis not present

## 2019-08-28 DIAGNOSIS — E669 Obesity, unspecified: Secondary | ICD-10-CM | POA: Diagnosis not present

## 2019-08-28 DIAGNOSIS — M2550 Pain in unspecified joint: Secondary | ICD-10-CM | POA: Diagnosis not present

## 2019-08-28 DIAGNOSIS — Z6830 Body mass index (BMI) 30.0-30.9, adult: Secondary | ICD-10-CM | POA: Diagnosis not present

## 2019-08-28 DIAGNOSIS — M154 Erosive (osteo)arthritis: Secondary | ICD-10-CM | POA: Diagnosis not present

## 2019-08-28 DIAGNOSIS — Z79899 Other long term (current) drug therapy: Secondary | ICD-10-CM | POA: Diagnosis not present

## 2019-08-30 DIAGNOSIS — M25561 Pain in right knee: Secondary | ICD-10-CM | POA: Diagnosis not present

## 2019-09-05 DIAGNOSIS — M25561 Pain in right knee: Secondary | ICD-10-CM | POA: Diagnosis not present

## 2019-09-13 DIAGNOSIS — M1712 Unilateral primary osteoarthritis, left knee: Secondary | ICD-10-CM | POA: Diagnosis not present

## 2019-09-13 DIAGNOSIS — M23200 Derangement of unspecified lateral meniscus due to old tear or injury, right knee: Secondary | ICD-10-CM | POA: Diagnosis not present

## 2019-09-13 DIAGNOSIS — M94261 Chondromalacia, right knee: Secondary | ICD-10-CM | POA: Diagnosis not present

## 2019-09-13 DIAGNOSIS — S83231A Complex tear of medial meniscus, current injury, right knee, initial encounter: Secondary | ICD-10-CM | POA: Diagnosis not present

## 2019-09-13 DIAGNOSIS — Y999 Unspecified external cause status: Secondary | ICD-10-CM | POA: Diagnosis not present

## 2019-09-13 DIAGNOSIS — X58XXXA Exposure to other specified factors, initial encounter: Secondary | ICD-10-CM | POA: Diagnosis not present

## 2019-09-13 DIAGNOSIS — S83282A Other tear of lateral meniscus, current injury, left knee, initial encounter: Secondary | ICD-10-CM | POA: Diagnosis not present

## 2019-09-13 DIAGNOSIS — S83232A Complex tear of medial meniscus, current injury, left knee, initial encounter: Secondary | ICD-10-CM | POA: Diagnosis not present

## 2019-09-13 DIAGNOSIS — M23221 Derangement of posterior horn of medial meniscus due to old tear or injury, right knee: Secondary | ICD-10-CM | POA: Diagnosis not present

## 2019-09-19 DIAGNOSIS — M545 Low back pain: Secondary | ICD-10-CM | POA: Diagnosis not present

## 2019-09-19 DIAGNOSIS — Z5189 Encounter for other specified aftercare: Secondary | ICD-10-CM | POA: Diagnosis not present

## 2019-09-21 ENCOUNTER — Other Ambulatory Visit: Payer: Self-pay

## 2019-09-21 ENCOUNTER — Other Ambulatory Visit (INDEPENDENT_AMBULATORY_CARE_PROVIDER_SITE_OTHER): Payer: Medicare Other

## 2019-09-21 ENCOUNTER — Telehealth (INDEPENDENT_AMBULATORY_CARE_PROVIDER_SITE_OTHER): Payer: Medicare Other | Admitting: Internal Medicine

## 2019-09-21 DIAGNOSIS — E876 Hypokalemia: Secondary | ICD-10-CM | POA: Diagnosis not present

## 2019-09-21 DIAGNOSIS — Z9889 Other specified postprocedural states: Secondary | ICD-10-CM | POA: Diagnosis not present

## 2019-09-21 NOTE — Progress Notes (Signed)
Virtual Visit via Video Note  I connected with Kristy [name] on 09/21/19 at 10:00 AM EST by a video enabled telemedicine application and verified that I am speaking with the correct person using two identifiers.  Location patient: home Location provider: work office Persons participating in the virtual visit: patient, provider  I discussed the limitations of evaluation and management by telemedicine and the availability of in person appointments. The patient expressed understanding and agreed to proceed.   HPI: She had right knee surgery last week. Was told by anesthesiologist that her K level was 2.2 and she needed to follow up with me. She believes she received IV KCl prior to surgery. She still has significant pain from knee surgery.   ROS: Constitutional: Denies fever, chills, diaphoresis, appetite change and fatigue.  HEENT: Denies photophobia, eye pain, redness, hearing loss, ear pain, congestion, sore throat, rhinorrhea, sneezing, mouth sores, trouble swallowing, neck pain, neck stiffness and tinnitus.   Respiratory: Denies SOB, DOE, cough, chest tightness,  and wheezing.   Cardiovascular: Denies chest pain, palpitations and leg swelling.  Gastrointestinal: Denies nausea, vomiting, abdominal pain, diarrhea, constipation, blood in stool and abdominal distention.  Genitourinary: Denies dysuria, urgency, frequency, hematuria, flank pain and difficulty urinating.  Endocrine: Denies: hot or cold intolerance, sweats, changes in hair or nails, polyuria, polydipsia. Musculoskeletal: Denies myalgias Skin: Denies pallor, rash and wound.  Neurological: Denies dizziness, seizures, syncope, weakness, light-headedness, numbness and headaches.  Hematological: Denies adenopathy. Easy bruising, personal or family bleeding history  Psychiatric/Behavioral: Denies suicidal ideation, mood changes, confusion, nervousness, sleep disturbance and agitation   Past Medical History:  Diagnosis Date   . Allergic rhinitis, seasonal   . Arthritis   . Asthma   . GERD (gastroesophageal reflux disease)   . History of colon polyps   . Hypertension   . Rotator cuff tear, right     Past Surgical History:  Procedure Laterality Date  . CHOLECYSTECTOMY OPEN  AGE 63  . INGUINAL HERNIA REPAIR Right AGE 33s  . KNEE ARTHROSCOPY Left 2014  . LUMBAR [place] SURGERY  x2  LAST DATE EARLY 2000s  . ORIF ANKLE FRACTURE Right 06/24/2018   Procedure: OPEN REDUCTION INTERNAL FIXATION (ORIF) RIGHT ANKLE FRACTURE;  Surgeon: Mcarthur Rossetti, MD;  Location: [facility];  Service: Orthopedics;  Laterality: Right;  . ROTATOR CUFF REPAIR Left 01/2015  . TONSILLECTOMY  AGE 64  . VAGINAL HYSTERECTOMY  AGE 63   WITH BSO    Family History  Problem Relation Age of Onset  . Colon cancer Neg Hx   . Esophageal cancer Neg Hx   . Rectal cancer Neg Hx   . Stomach cancer Neg Hx     SOCIAL HX:   reports that she quit smoking about 30 years ago. She quit after 10.00 years of use. She has never used smokeless tobacco. She reports current alcohol use of about 3.0 - 4.0 standard drinks of alcohol per week. She reports that she does not use drugs.   Current Outpatient Medications:  .  albuterol (VENTOLIN HFA) 108 (90 Base) MCG/ACT inhaler, INHALE 2 PUFFS BY MOUTH EVERY 6 HOURS AS NEEDED FOR WHEEZING, Disp: 6.7 g, Rfl: 1 .  amLODipine (NORVASC) 10 MG tablet, TAKE 1 TABLET BY MOUTH EVERY DAY, Disp: 90 tablet, Rfl: 1 .  benazepril (LOTENSIN) 40 MG tablet, TAKE 1 TABLET BY MOUTH EVERY DAY, Disp: 90 tablet, Rfl: 1 .  celecoxib (CELEBREX) 200 MG capsule, TAKE 1 CAPSULE BY MOUTH EVERY DAY (Patient taking  differently: Take 200 mg by mouth daily. ), Disp: 90 capsule, Rfl: 1 .  fluticasone (FLONASE) 50 MCG/ACT nasal spray, SPRAY 2 SPRAYS INTO EACH NOSTRIL EVERY DAY, Disp: 48 mL, Rfl: 0 .  hydrochlorothiazide (HYDRODIURIL) 25 MG tablet, TAKE 1 TABLET BY MOUTH EVERY DAY (Patient taking differently: Take 25 mg by mouth daily. TAKE 1  TABLET BY MOUTH EVERY DAY), Disp: 90 tablet, Rfl: 3 .  HYDROcodone-acetaminophen (NORCO/VICODIN) 5-325 MG tablet, Take 1 tablet by mouth every 6 (six) hours as needed for moderate pain., Disp: , Rfl:  .  predniSONE (DELTASONE) 10 MG tablet, Take 10 mg by mouth 2 (two) times daily with a meal., Disp: , Rfl:  .  WIXELA INHUB 250-50 MCG/DOSE AEPB, INHALE 1 PUFF BY MOUTH EVERY 12 HOURS (Patient taking differently: Inhale 1 puff into the lungs every 12 (twelve) hours. ), Disp: 180 each, Rfl: 1 .  COLLAGEN PO, Take by mouth. Collagen powder-mix with coffee and drink daily, Disp: , Rfl:  .  Liniments (SALONPAS PAIN RELIEF PATCH EX), Apply 1 patch topically daily as needed (pain)., Disp: , Rfl:  .  Menthol, Topical Analgesic, (BLUE-EMU MAXIMUM STRENGTH EX), Apply 1 application topically daily., Disp: , Rfl:  .  potassium chloride SA (K-DUR) 20 MEQ tablet, Take 1 tablet (20 mEq total) by mouth daily for 3 days., Disp: 3 tablet, Rfl: 0 .  traMADol (ULTRAM) 50 MG tablet, TAKE 1 TABLET (50 MG TOTAL) BY MOUTH EVERY 12 (TWELVE) HOURS AS NEEDED. (Patient not taking: Reported on 09/21/2019), Disp: 60 tablet, Rfl: 0  EXAM:   VITALS per patient if applicable: none reported  GENERAL: alert, oriented, appears well and in no acute distress, lying in bed.  HEENT: atraumatic, conjunttiva clear, no obvious abnormalities on inspection of external nose and ears  NECK: normal movements of the head and neck  LUNGS: on inspection no signs of respiratory distress, breathing rate appears normal, no obvious gross increased work of breathing, gasping or wheezing  CV: no obvious cyanosis  MS: moves all visible extremities without noticeable abnormality  PSYCH/NEURO: pleasant and cooperative, no obvious depression or anxiety, speech and thought processing grossly intact  ASSESSMENT AND PLAN:   Hypokalemia  H/O right knee surgery -Lab appointment made for this afternoon for BMET to check K levels. -Plan to follow.      I discussed the assessment and treatment plan with the patient. The patient was provided an opportunity to ask questions and all were answered. The patient agreed with the plan and demonstrated an understanding of the instructions.   The patient was advised to call back or seek an in-person evaluation if the symptoms worsen or if the condition fails to improve as anticipated.    Chaya Jan, MD  [facility] at [facility]

## 2019-09-22 LAB — BASIC METABOLIC PANEL
BUN: 23 mg/dL (ref 6–23)
CO2: 24 mEq/L (ref 19–32)
Calcium: 9.4 mg/dL (ref 8.4–10.5)
Chloride: 100 mEq/L (ref 96–112)
Creatinine, Ser: 0.84 mg/dL (ref 0.40–1.20)
GFR: 78.89 mL/min (ref >60.00)
Glucose, Bld: 403 mg/dL — ABNORMAL HIGH (ref 70–99)
Potassium: 4 mEq/L (ref 3.5–5.1)
Sodium: 137 mEq/L (ref 135–145)

## 2019-11-03 DIAGNOSIS — M25561 Pain in right knee: Secondary | ICD-10-CM | POA: Diagnosis not present

## 2019-11-03 DIAGNOSIS — M1711 Unilateral primary osteoarthritis, right knee: Secondary | ICD-10-CM | POA: Diagnosis not present

## 2019-12-02 DIAGNOSIS — M25561 Pain in right knee: Secondary | ICD-10-CM | POA: Diagnosis not present

## 2019-12-19 ENCOUNTER — Other Ambulatory Visit: Payer: Self-pay | Admitting: Internal Medicine

## 2019-12-19 DIAGNOSIS — S82851D Displaced trimalleolar fracture of right lower leg, subsequent encounter for closed fracture with routine healing: Secondary | ICD-10-CM

## 2019-12-29 DIAGNOSIS — M1711 Unilateral primary osteoarthritis, right knee: Secondary | ICD-10-CM | POA: Diagnosis not present

## 2020-01-05 DIAGNOSIS — M1711 Unilateral primary osteoarthritis, right knee: Secondary | ICD-10-CM | POA: Diagnosis not present

## 2020-01-12 DIAGNOSIS — M1711 Unilateral primary osteoarthritis, right knee: Secondary | ICD-10-CM | POA: Diagnosis not present

## 2020-01-25 ENCOUNTER — Other Ambulatory Visit: Payer: Self-pay | Admitting: Internal Medicine

## 2020-01-25 DIAGNOSIS — J4520 Mild intermittent asthma, uncomplicated: Secondary | ICD-10-CM

## 2020-01-31 ENCOUNTER — Telehealth: Payer: Self-pay | Admitting: Internal Medicine

## 2020-01-31 MED ORDER — HYDROCHLOROTHIAZIDE 25 MG PO TABS: 25.0000 mg | ORAL_TABLET | Freq: Every day | ORAL | 0 refills | Status: DC

## 2020-01-31 NOTE — Telephone Encounter (Signed)
Refill sent.

## 2020-01-31 NOTE — Telephone Encounter (Signed)
Pt is calling in stating that she needs a refill on her hydrochlorothiazide 25 MG  Pham:  [facility] on [address].

## 2020-02-23 DIAGNOSIS — M1711 Unilateral primary osteoarthritis, right knee: Secondary | ICD-10-CM | POA: Diagnosis not present

## 2020-02-26 DIAGNOSIS — Z79899 Other long term (current) drug therapy: Secondary | ICD-10-CM | POA: Diagnosis not present

## 2020-02-26 DIAGNOSIS — M2550 Pain in unspecified joint: Secondary | ICD-10-CM | POA: Diagnosis not present

## 2020-02-26 DIAGNOSIS — Z6830 Body mass index (BMI) 30.0-30.9, adult: Secondary | ICD-10-CM | POA: Diagnosis not present

## 2020-02-26 DIAGNOSIS — M150 Primary generalized (osteo)arthritis: Secondary | ICD-10-CM | POA: Diagnosis not present

## 2020-02-26 DIAGNOSIS — E669 Obesity, unspecified: Secondary | ICD-10-CM | POA: Diagnosis not present

## 2020-02-26 DIAGNOSIS — M154 Erosive (osteo)arthritis: Secondary | ICD-10-CM | POA: Diagnosis not present

## 2020-02-26 DIAGNOSIS — M898X1 Other specified disorders of bone, shoulder: Secondary | ICD-10-CM | POA: Diagnosis not present

## 2020-03-05 ENCOUNTER — Other Ambulatory Visit: Payer: Self-pay

## 2020-03-06 ENCOUNTER — Encounter: Payer: Self-pay | Admitting: Internal Medicine

## 2020-03-06 ENCOUNTER — Ambulatory Visit (INDEPENDENT_AMBULATORY_CARE_PROVIDER_SITE_OTHER): Payer: Medicare Other | Admitting: Internal Medicine

## 2020-03-06 VITALS — BP 110/80 | HR 86 | Temp 97.3°F | Wt 165.2 lb

## 2020-03-06 DIAGNOSIS — R7302 Impaired glucose tolerance (oral): Secondary | ICD-10-CM

## 2020-03-06 DIAGNOSIS — Z01818 Encounter for other preprocedural examination: Secondary | ICD-10-CM | POA: Diagnosis not present

## 2020-03-06 DIAGNOSIS — R079 Chest pain, unspecified: Secondary | ICD-10-CM | POA: Diagnosis not present

## 2020-03-06 DIAGNOSIS — R0602 Shortness of breath: Secondary | ICD-10-CM | POA: Diagnosis not present

## 2020-03-06 DIAGNOSIS — R9431 Abnormal electrocardiogram [ECG] [EKG]: Secondary | ICD-10-CM | POA: Diagnosis not present

## 2020-03-06 DIAGNOSIS — R42 Dizziness and giddiness: Secondary | ICD-10-CM | POA: Diagnosis not present

## 2020-03-06 DIAGNOSIS — I10 Essential (primary) hypertension: Secondary | ICD-10-CM

## 2020-03-06 LAB — CBC WITH DIFFERENTIAL/PLATELET
Basophils Absolute: 0.1 10*3/uL (ref 0.0–0.1)
Basophils Relative: 0.8 % (ref 0.0–3.0)
Eosinophils Absolute: 0.3 10*3/uL (ref 0.0–0.7)
Eosinophils Relative: 4.1 % (ref 0.0–5.0)
HCT: 35.5 % — ABNORMAL LOW (ref 36.0–46.0)
Hemoglobin: 11.7 g/dL — ABNORMAL LOW (ref 12.0–15.0)
Lymphocytes Relative: 28.9 % (ref 12.0–46.0)
Lymphs Abs: 2.3 10*3/uL (ref 0.7–4.0)
MCHC: 32.8 g/dL (ref 30.0–36.0)
MCV: 81 fl (ref 78.0–100.0)
Monocytes Absolute: 0.4 10*3/uL (ref 0.1–1.0)
Monocytes Relative: 5.6 % (ref 3.0–12.0)
Neutro Abs: 4.8 10*3/uL (ref 1.4–7.7)
Neutrophils Relative %: 60.6 % (ref 43.0–77.0)
Platelets: 385 10*3/uL (ref 150.0–400.0)
RBC: 4.39 Mil/uL (ref 3.87–5.11)
RDW: 17.6 % — ABNORMAL HIGH (ref 11.5–15.5)
WBC: 7.9 10*3/uL (ref 4.0–10.5)

## 2020-03-06 LAB — COMPREHENSIVE METABOLIC PANEL
ALT: 12 U/L (ref 0–35)
AST: 15 U/L (ref 0–37)
Albumin: 4.6 g/dL (ref 3.5–5.2)
Alkaline Phosphatase: 61 U/L (ref 39–117)
BUN: 20 mg/dL (ref 6–23)
CO2: 29 mEq/L (ref 19–32)
Calcium: 10.1 mg/dL (ref 8.4–10.5)
Chloride: 100 mEq/L (ref 96–112)
Creatinine, Ser: 0.76 mg/dL (ref 0.40–1.20)
GFR: 88.45 mL/min (ref >60.00)
Glucose, Bld: 121 mg/dL — ABNORMAL HIGH (ref 70–99)
Potassium: 3.1 mEq/L — ABNORMAL LOW (ref 3.5–5.1)
Sodium: 139 mEq/L (ref 135–145)
Total Bilirubin: 0.3 mg/dL (ref 0.2–1.2)
Total Protein: 8 g/dL (ref 6.0–8.3)

## 2020-03-06 LAB — MAGNESIUM: Magnesium: 2 mg/dL (ref 1.5–2.5)

## 2020-03-06 LAB — HEMOGLOBIN A1C: Hgb A1c MFr Bld: 6.6 % — ABNORMAL HIGH (ref 4.6–6.5)

## 2020-03-06 LAB — TSH: TSH: 2.37 u[IU]/mL (ref 0.35–4.50)

## 2020-03-06 NOTE — Patient Instructions (Signed)
-  Nice seeing you today!!  -Lab work today; will notify you once results are available.  -Will send for cardiology appointment and echocardiogram prior to surgery.

## 2020-03-06 NOTE — Progress Notes (Signed)
Established Patient Office Visit     This visit occurred during the SARS-CoV-2 public health emergency.  Safety protocols were in place, including screening questions prior to the visit, additional usage of staff PPE, and extensive cleaning of exam room while observing appropriate contact time as indicated for disinfecting solutions.    CC/Reason for Visit: Preoperative clearance  HPI: Kristy [name] is a 79 y.o. female who is coming in today for the above mentioned reasons.  I received a fax for preoperative clearance.  She is scheduled for a right total knee replacement on June 7.  She has been having severe difficulty walking.  She is now using a cane.  She typically enjoys  Zumba and bowling and has been unable to do so.  Since I have not seen her in over 8 months, I advised in person appointment for preoperative clearance.  She tells me that she has been feeling "skipping beats" for about 5 to 6 years.  Recently she feels dizzy at times with substernal chest pain that gets better when she sits down and rests.  She also feels some "shallow breathing" and a sensation of pressure in her chest when she exerts herself.  She recently saw her rheumatologist and was told she had severe iron deficiency and has been taking iron tablets.   Past Medical/Surgical History: Past Medical History:  Diagnosis Date  . Allergic rhinitis, seasonal   . Arthritis   . Asthma   . GERD (gastroesophageal reflux disease)   . History of colon polyps   . Hypertension   . Rotator cuff tear, right     Past Surgical History:  Procedure Laterality Date  . CHOLECYSTECTOMY OPEN  AGE 31  . INGUINAL HERNIA REPAIR Right AGE 33s  . KNEE ARTHROSCOPY Left 2014  . LUMBAR [place] SURGERY  x2  LAST DATE EARLY 2000s  . ORIF ANKLE FRACTURE Right 06/24/2018   Procedure: OPEN REDUCTION INTERNAL FIXATION (ORIF) RIGHT ANKLE FRACTURE;  Surgeon: Mcarthur Rossetti, MD;  Location: [facility];  Service: Orthopedics;  Laterality:  Right;  . ROTATOR CUFF REPAIR Left 01/2015  . TONSILLECTOMY  AGE 17  . VAGINAL HYSTERECTOMY  AGE 89   WITH BSO    Social History:  reports that she quit smoking about 31 years ago. She quit after 10.00 years of use. She has never used smokeless tobacco. She reports current alcohol use of about 3.0 - 4.0 standard drinks of alcohol per week. She reports that she does not use drugs.  Allergies: Allergies  Allergen Reactions  . Clonidine Hives    Family History:  Family History  Problem Relation Age of Onset  . Colon cancer Neg Hx   . Esophageal cancer Neg Hx   . Rectal cancer Neg Hx   . Stomach cancer Neg Hx      Current Outpatient Medications:  .  albuterol (VENTOLIN HFA) 108 (90 Base) MCG/ACT inhaler, TAKE 2 PUFFS BY MOUTH EVERY 6 HOURS AS NEEDED FOR WHEEZE (Patient taking differently: Inhale 2 puffs into the lungs every 6 (six) hours as needed for wheezing. ), Disp: 6.7 g, Rfl: 1 .  amLODipine (NORVASC) 10 MG tablet, TAKE 1 TABLET BY MOUTH EVERY DAY (Patient taking differently: Take 10 mg by mouth at bedtime. ), Disp: 90 tablet, Rfl: 1 .  benazepril (LOTENSIN) 40 MG tablet, TAKE 1 TABLET BY MOUTH EVERY DAY (Patient taking differently: Take 40 mg by mouth every evening. ), Disp: 90 tablet, Rfl: 1 .  celecoxib (  CELEBREX) 200 MG capsule, TAKE 1 CAPSULE BY MOUTH EVERY DAY (Patient taking differently: Take 200 mg by mouth at bedtime. ), Disp: 90 capsule, Rfl: 1 .  ferrous sulfate 325 (65 FE) MG tablet, Take 325 mg by mouth daily with breakfast., Disp: , Rfl:  .  fluticasone (FLONASE) 50 MCG/ACT nasal spray, SPRAY 2 SPRAYS INTO EACH NOSTRIL EVERY DAY (Patient taking differently: Place 2 sprays into both nostrils daily. ), Disp: 48 mL, Rfl: 0 .  hydrochlorothiazide (HYDRODIURIL) 25 MG tablet, Take 1 tablet (25 mg total) by mouth daily. (Patient taking differently: Take 25 mg by mouth at bedtime. ), Disp: 90 tablet, Rfl: 0 .  traMADol (ULTRAM) 50 MG tablet, TAKE 1 TABLET (50 MG TOTAL) BY  MOUTH EVERY 12 (TWELVE) HOURS AS NEEDED. (Patient taking differently: Take 50 mg by mouth every 12 (twelve) hours as needed (pain.). ), Disp: 60 tablet, Rfl: 0 .  WIXELA INHUB 250-50 MCG/DOSE AEPB, INHALE 1 PUFF BY MOUTH EVERY 12 HOURS (Patient taking differently: Inhale 1 puff into the lungs 2 (two) times daily as needed (respiratory issues). ), Disp: 180 each, Rfl: 1 .  potassium chloride SA (K-DUR) 20 MEQ tablet, Take 1 tablet (20 mEq total) by mouth daily for 3 days., Disp: 3 tablet, Rfl: 0  Review of Systems:  Constitutional: Denies fever, chills, diaphoresis, appetite change and fatigue.  HEENT: Denies photophobia, eye pain, redness, hearing loss, ear pain, congestion, sore throat, rhinorrhea, sneezing, mouth sores, trouble swallowing, neck pain, neck stiffness and tinnitus.   Respiratory: Denies SOB, DOE, cough, chest tightness,  and wheezing.   Cardiovascular: Denies chest pain, palpitations and leg swelling.  Gastrointestinal: Denies nausea, vomiting, abdominal pain, diarrhea, constipation, blood in stool and abdominal distention.  Genitourinary: Denies dysuria, urgency, frequency, hematuria, flank pain and difficulty urinating.  Endocrine: Denies: hot or cold intolerance, sweats, changes in hair or nails, polyuria, polydipsia. Musculoskeletal: Denies myalgias, back pain, joint swelling, arthralgias and gait problem.  Skin: Denies pallor, rash and wound.  Neurological: Denies dizziness, seizures, syncope, weakness, light-headedness, numbness and headaches.  Hematological: Denies adenopathy. Easy bruising, personal or family bleeding history  Psychiatric/Behavioral: Denies suicidal ideation, mood changes, confusion, nervousness, sleep disturbance and agitation    Physical Exam: Vitals:   03/06/20 0947  BP: 110/80  Pulse: 86  Temp: (!) 97.3 F (36.3 C)  TempSrc: Temporal  SpO2: 96%  Weight: 165 lb 3.2 oz (74.9 kg)    Body mass index is 30.22 kg/m.   Constitutional: NAD,  calm, comfortable walks with a cane heavily favoring her left side. Eyes: PERRL, lids and conjunctivae normal ENMT: Mucous membranes are moist.  Respiratory: clear to auscultation bilaterally, no wheezing, no crackles. Normal respiratory effort. No accessory muscle use.  Cardiovascular: Frequent extrasystolic beats, irregular rhythm, normal rate, no murmurs / rubs / gallops. No extremity edema. 2+ pedal pulses. Neurologic: CN 2-12 grossly intact. Sensation intact, DTR normal.  Psychiatric: Normal judgment and insight. Alert and oriented x 3. Normal mood.    Impression and Plan:  Preoperative clearance  Essential hypertension  Abnormal EKG  SOB (shortness of breath)  Chest pain, unspecified type  Dizziness -EKG done in the office today shows: An irregular rhythm, appears to be frequent PACs with a rate of around 85, normal axis, no acute ST-T wave changes. -With her history and her EKG, believe she needs to see cardiology for preoperative clearance.  We will try and place an urgent referral as her surgery is scheduled for June 7 although this is not  guaranteed. -We will also place order for 2D echocardiogram.     IGT (impaired glucose tolerance)  - Plan: Hemoglobin A1c    Patient Instructions  -Nice seeing you today!!  -Lab work today; will notify you once results are available.  -Will send for cardiology appointment and echocardiogram prior to surgery.     Chaya Jan, MD [facility] at [facility]

## 2020-03-07 ENCOUNTER — Other Ambulatory Visit: Payer: Self-pay | Admitting: Internal Medicine

## 2020-03-07 ENCOUNTER — Encounter: Payer: Self-pay | Admitting: Internal Medicine

## 2020-03-07 ENCOUNTER — Encounter: Payer: Self-pay | Admitting: Cardiovascular Disease

## 2020-03-07 ENCOUNTER — Ambulatory Visit (INDEPENDENT_AMBULATORY_CARE_PROVIDER_SITE_OTHER): Payer: Medicare Other | Admitting: Cardiovascular Disease

## 2020-03-07 ENCOUNTER — Other Ambulatory Visit: Payer: Self-pay

## 2020-03-07 VITALS — BP 132/64 | HR 85 | Wt 168.0 lb

## 2020-03-07 DIAGNOSIS — I10 Essential (primary) hypertension: Secondary | ICD-10-CM

## 2020-03-07 DIAGNOSIS — R008 Other abnormalities of heart beat: Secondary | ICD-10-CM | POA: Diagnosis not present

## 2020-03-07 DIAGNOSIS — E876 Hypokalemia: Secondary | ICD-10-CM | POA: Diagnosis not present

## 2020-03-07 DIAGNOSIS — E119 Type 2 diabetes mellitus without complications: Secondary | ICD-10-CM | POA: Insufficient documentation

## 2020-03-07 DIAGNOSIS — Z01818 Encounter for other preprocedural examination: Secondary | ICD-10-CM | POA: Diagnosis not present

## 2020-03-07 DIAGNOSIS — J45998 Other asthma: Secondary | ICD-10-CM | POA: Diagnosis not present

## 2020-03-07 MED ORDER — POTASSIUM CHLORIDE CRYS ER 20 MEQ PO TBCR: 40.0000 meq | EXTENDED_RELEASE_TABLET | Freq: Every day | ORAL | 0 refills | Status: DC

## 2020-03-07 MED ORDER — METOPROLOL SUCCINATE ER 25 MG PO TB24
25.0000 mg | ORAL_TABLET | Freq: Every day | ORAL | 3 refills | Status: DC
Start: 2020-03-07 — End: 2020-03-15

## 2020-03-07 MED ORDER — METFORMIN HCL 500 MG PO TABS: 500.0000 mg | ORAL_TABLET | Freq: Two times a day (BID) | ORAL | 1 refills | Status: DC

## 2020-03-07 NOTE — Progress Notes (Signed)
Cardiology Office Note    Date:  03/16/2020   ID:  Kristy [name], DOB 05/15/1941, MRN [id number]  PCP:  Isaac Bliss, Rayford Halsted, MD  Cardiologist:  Shelva Majestic, MD   New cardiology evaluation referred through the courtesy of Dr. Isaac Bliss for preoperative evaluation prior to undergoing elective knee replacement surgery.  History of Present Illness:  Kristy [name] is a 79 y.o. female who is followed by Dr. Isaac Bliss for primary care.  She had previously undergone right knee arthroscopy and apparently is scheduled to undergo right total knee replacement electively on June 7.  She has had significant difficulty with walking and has been using a cane.  Previously in the past she had enjoyed Zumba as well as bowling.  She was seen yesterday by Dr. Isaac Bliss for preoperative clearance.  During her evaluation she was noticed to have frequent skipping beats and palpitations.  An ECG showed sinus rhythm with frequent PACs with a ventricular rate around 85 without acute ST-T changes.  She has a history of essential hypertension.  She was worked into my schedule today to be seen for preoperative cardiology clearance prior to her elective surgery.  A 2D echo Doppler study had been scheduled.  Presently, the patient denies any chest pain or chest tightness.  Apparently, laboratory from yesterday had shown a hemoglobin A1c at 6.6 and Dr. Isaac Bliss had called in a prescription for metformin to initiate 500 mg twice a day effective today.  In addition she was found to be hypokalemic with a potassium of 3.1 and supplemental potassium 40 mEq daily for 3 days was ordered.  Patient is unaware of any heart rate irregularity.  I reviewed the EKG from yesterday which showed every fourth beat being a PAC.  Ventricular rate was 85 bpm.  She denies PND orthopnea.  She denies presyncope or syncope.  She is unaware of any significant cardiac murmur.   Past Medical History:  Diagnosis Date  .  Allergic rhinitis, seasonal   . Arthritis   . Asthma    bronchial with weather change  . Diabetes mellitus without complication ([place])    type 2   . Dysrhythmia    "Skipping a beat"  . GERD (gastroesophageal reflux disease)   . History of colon polyps   . Hypertension   . Pneumonia   . Rotator cuff tear, right     Past Surgical History:  Procedure Laterality Date  . BACK SURGERY     x3  ruptured disc   . CHOLECYSTECTOMY OPEN  AGE 89  . EYE SURGERY     bil cataracts  . INGUINAL HERNIA REPAIR Right AGE 41s  . KNEE ARTHROSCOPY Left 2014  . LUMBAR [place] SURGERY  x2  LAST DATE EARLY 2000s  . ORIF ANKLE FRACTURE Right 06/24/2018   Procedure: OPEN REDUCTION INTERNAL FIXATION (ORIF) RIGHT ANKLE FRACTURE;  Surgeon: Mcarthur Rossetti, MD;  Location: [facility];  Service: Orthopedics;  Laterality: Right;  . ROTATOR CUFF REPAIR Left 01/2015  . TONSILLECTOMY  AGE 46  . VAGINAL HYSTERECTOMY  AGE 45   WITH BSO    Current Medications: Outpatient Medications Prior to Visit  Medication Sig Dispense Refill  . albuterol (VENTOLIN HFA) 108 (90 Base) MCG/ACT inhaler TAKE 2 PUFFS BY MOUTH EVERY 6 HOURS AS NEEDED FOR WHEEZE (Patient taking differently: Inhale 2 puffs into the lungs every 6 (six) hours as needed for wheezing. ) 6.7 g 1  . amLODipine (NORVASC) 10 MG tablet  TAKE 1 TABLET BY MOUTH EVERY DAY (Patient taking differently: Take 10 mg by mouth at bedtime. ) 90 tablet 1  . benazepril (LOTENSIN) 40 MG tablet TAKE 1 TABLET BY MOUTH EVERY DAY (Patient taking differently: Take 40 mg by mouth every evening. ) 90 tablet 1  . celecoxib (CELEBREX) 200 MG capsule TAKE 1 CAPSULE BY MOUTH EVERY DAY (Patient taking differently: Take 200 mg by mouth at bedtime. ) 90 capsule 1  . ferrous sulfate 325 (65 FE) MG tablet Take 325 mg by mouth daily with breakfast.    . fluticasone (FLONASE) 50 MCG/ACT nasal spray SPRAY 2 SPRAYS INTO EACH NOSTRIL EVERY DAY (Patient taking differently: Place 2 sprays into both  nostrils daily. ) 48 mL 0  . metFORMIN (GLUCOPHAGE) 500 MG tablet Take 1 tablet (500 mg total) by mouth 2 (two) times daily with a meal. 180 tablet 1  . traMADol (ULTRAM) 50 MG tablet TAKE 1 TABLET (50 MG TOTAL) BY MOUTH EVERY 12 (TWELVE) HOURS AS NEEDED. (Patient taking differently: Take 50 mg by mouth every 12 (twelve) hours as needed (pain.). ) 60 tablet 0  . WIXELA INHUB 250-50 MCG/DOSE AEPB INHALE 1 PUFF BY MOUTH EVERY 12 HOURS (Patient taking differently: Inhale 1 puff into the lungs 2 (two) times daily as needed (respiratory issues). ) 180 each 1  . hydrochlorothiazide (HYDRODIURIL) 25 MG tablet Take 1 tablet (25 mg total) by mouth daily. (Patient taking differently: Take 25 mg by mouth at bedtime. ) 90 tablet 0  . potassium chloride SA (KLOR-CON) 20 MEQ tablet Take 2 tablets (40 mEq total) by mouth daily for 3 days. 6 tablet 0  . potassium chloride SA (K-DUR) 20 MEQ tablet Take 1 tablet (20 mEq total) by mouth daily for 3 days. 3 tablet 0   No facility-administered medications prior to visit.     Allergies:   Clonidine   Social History   Socioeconomic History  . Marital status: Widowed    Spouse name: Not on file  . Number of children: Not on file  . Years of education: Not on file  . Highest education level: Not on file  Occupational History  . Not on file  Tobacco Use  . Smoking status: Former Smoker    Years: 10.00    Quit date: 10/19/1988    Years since quitting: 31.4  . Smokeless tobacco: Never Used  . Tobacco comment: 1 a day  Substance and Sexual Activity  . Alcohol use: Yes    Alcohol/week: 3.0 - 4.0 standard drinks    Types: 3 - 4 Glasses of wine per week    Comment: 2-3 times a week  . Drug use: Never  . Sexual activity: Not Currently  Other Topics Concern  . Not on file  Social History Narrative  . Not on file   Social Determinants of Health   Financial Resource Strain:   . Difficulty of Paying Living Expenses:   Food Insecurity:   . Worried About  Charity fundraiser in the Last Year:   . Arboriculturist in the Last Year:   Transportation Needs:   . Film/video editor (Medical):   Marland Kitchen Lack of Transportation (Non-Medical):   Physical Activity:   . Days of Exercise per Week:   . Minutes of Exercise per Session:   Stress:   . Feeling of Stress :   Social Connections:   . Frequency of Communication with Friends and Family:   . Frequency of Social Gatherings  with Friends and Family:   . Attends Religious Services:   . Active Member of Clubs or Organizations:   . Attends Archivist Meetings:   Marland Kitchen Marital Status:     Additional social history: She is widowed for 1 year.  She has 4 children, 12 grandchildren and 10 great-grandchildren.  She is retired.  Previously she was a Librarian, academic for [employer].  She has associated college degree.  Remotely she had smoked for 5 years.  She quit in 2001.  She does drink occasional wine.  Used to be very active but this has been limited due to her progressive knee pain  Family History: Family history is notable that her mother died at age 31 with a stroke.  Father died at age 37.  Maternal grandmother died at 41 with natural causes.  She has 4 brothers and 3 sisters.  ROS General: Negative; No fevers, chills, or night sweats;  HEENT: Negative; No changes in vision or hearing, sinus congestion, difficulty swallowing Pulmonary: History of allergy induced bronchial asthma Cardiovascular: See HPI GI: Negative; No nausea, vomiting, diarrhea, or abdominal pain GU: Negative; No dysuria, hematuria, or difficulty voiding Musculoskeletal: Positive for right knee discomfort Hematologic/Oncology: Negative; no easy bruising, bleeding Endocrine: Positive for diabetes Neuro: Negative; no changes in balance, headaches Skin: Negative; No rashes or skin lesions Psychiatric: Negative; No behavioral problems, depression Sleep: Negative; No snoring, daytime sleepiness, hypersomnolence, bruxism, restless  legs, hypnogognic hallucinations, no cataplexy Other comprehensive 14 point system review is negative.   PHYSICAL EXAM:   VS:  BP 132/64   Pulse 85   Wt 168 lb (76.2 kg)   SpO2 98%   BMI 30.73 kg/m     Repeat blood pressure by me was 144/70 supine 142/70 standing  Wt Readings from Last 3 Encounters:  03/15/20 163 lb 9 oz (74.2 kg)  03/15/20 165 lb 3.2 oz (74.9 kg)  03/11/20 164 lb (74.4 kg)    General: Alert, oriented, no distress.  Skin: normal turgor, no rashes, warm and dry HEENT: Normocephalic, atraumatic. Pupils equal round and reactive to light; sclera anicteric; extraocular muscles intact; bilateral arcus senilis  Nose without nasal septal hypertrophy Mouth/Parynx benign; Mallinpatti scale 3 Neck: No JVD, no carotid bruits; normal carotid upstroke Lungs: clear to ausculatation and percussion; no wheezing or rales Chest wall: without tenderness to palpitation Heart: PMI not displaced, frequent ectopy in a trigeminal rhythm, s1 s2 normal, 1/6 systolic murmur, no diastolic murmur, no rubs, gallops, thrills, or heaves Abdomen: soft, nontender; no hepatosplenomehaly, BS+; abdominal aorta nontender and not dilated by palpation. Back: no CVA tenderness Pulses 2+ Musculoskeletal: full range of motion, normal strength, no joint deformities Extremities: no clubbing cyanosis or edema, Homan's sign negative  Neurologic: grossly nonfocal; Cranial nerves grossly wnl Psychologic: Normal mood and affect   Studies/Labs Reviewed:   EKG:  EKG is ordered today.  ECG (independently read by me): Sinus rhythm with PACs and atrial trigeminal rhythm pattern.  QTc interval 446 ms  Recent Labs: BMP Latest Ref Rng & Units 03/15/2020 03/13/2020 03/06/2020  Glucose 70 - 99 mg/dL 92 105(H) 121(H)  BUN 8 - 23 mg/dL _0 Creatinine 0.44 - 1.00 mg/dL 0.81 0.90 0.76  BUN/Creat Ratio 12 - 28 - 19 -  Sodium 135 - 145 mmol/L 141 139 139  Potassium 3.5 - 5.1 mmol/L 3.9 3.9 3.1(L)  Chloride 98 -  111 mmol/L 103 102 100  CO2 22 - 32 mmol/L _1 Calcium 8.9 -  10.3 mg/dL 9.4 9.7 10.1     Hepatic Function Latest Ref Rng & Units 03/15/2020 03/06/2020 04/19/2019  Total Protein 6.5 - 8.1 g/dL 7.6 8.0 7.5  Albumin 3.5 - 5.0 g/dL 4.2 4.6 4.1  AST 15 - 41 U/L _0 ALT 0 - 44 U/L _1 Alk Phosphatase 38 - 126 U/L 54 61 49  Total Bilirubin 0.3 - 1.2 mg/dL 0.5 0.3 0.3  Bilirubin, Direct 0.0 - 0.3 mg/dL - - -    CBC Latest Ref Rng & Units 03/15/2020 03/06/2020 04/19/2019  WBC 4.0 - 10.5 K/uL 7.3 7.9 9.3  Hemoglobin 12.0 - 15.0 g/dL 11.6(L) 11.7(L) 12.9  Hematocrit 36.0 - 46.0 % 37.4 35.5(L) 39.8  Platelets 150 - 400 K/uL 348 385.0 321   Lab Results  Component Value Date   MCV 85.6 03/15/2020   MCV 81.0 03/06/2020   MCV 93.4 04/19/2019   Lab Results  Component Value Date   TSH 2.37 03/06/2020   Lab Results  Component Value Date   HGBA1C 6.6 (H) 03/06/2020     BNP No results found for: BNP  ProBNP No results found for: PROBNP   Lipid Panel     Component Value Date/Time   CHOL 197 03/14/2019 0810   TRIG 98.0 03/14/2019 0810   TRIG 82 09/30/2006 0943   HDL 60.90 03/14/2019 0810   CHOLHDL 3 03/14/2019 0810   VLDL 19.6 03/14/2019 0810   LDLCALC 116 (H) 03/14/2019 0810   LDLDIRECT 118.8 03/30/2013 1145     RADIOLOGY: ECHOCARDIOGRAM COMPLETE  Result Date: 03/12/2020    ECHOCARDIOGRAM REPORT   Patient Name:   NIKKIE LIMING Mccormac Date of Exam: 03/12/2020 Medical Rec #:  [id number]     Height:       62.0 in Accession #:    [id number]    Weight:       164.0 lb Date of Birth:  06-23-1941     BSA:          1.757 m Patient Age:    23 years      BP:           132/64 mmHg Patient Gender: F             HR:           75 bpm. Exam Location:  [facility] Procedure: 2D Echo, 3D Echo, Cardiac Doppler, Color Doppler and Strain Analysis Indications:    Z01.818 Pre-op evaluation  History:        Patient has no prior history of Echocardiogram examinations.                 Risk  Factors:Hypertension, Diabetes and Former Smoker.  Sonographer:    Jessee Avers, RDCS Referring Phys: [place]  1. Mild to moderate LV dysfunction; grade 1 diastolic dysfunction; mild LVE; mild AI and MR; mild LAE.  2. Left ventricular ejection fraction, by estimation, is 40 to 45%. The left ventricle has mildly decreased function. The left ventricle demonstrates global hypokinesis. The left ventricular internal cavity size was mildly dilated. Left ventricular diastolic parameters are consistent with Grade I diastolic dysfunction (impaired relaxation).  3. Right ventricular systolic function is normal. The right ventricular size is normal. There is mildly elevated pulmonary artery systolic pressure.  4. Left atrial size was mildly dilated.  5. The mitral valve is normal in structure. Mild mitral valve regurgitation. No evidence of mitral stenosis.  6. The aortic valve is  tricuspid. Aortic valve regurgitation is mild. No aortic stenosis is present.  7. The inferior vena cava is normal in size with greater than 50% respiratory variability, suggesting right atrial pressure of 3 mmHg. FINDINGS  Left Ventricle: Left ventricular ejection fraction, by estimation, is 40 to 45%. The left ventricle has mildly decreased function. The left ventricle demonstrates global hypokinesis. The left ventricular internal cavity size was mildly dilated. There is  no left ventricular hypertrophy. Left ventricular diastolic parameters are consistent with Grade I diastolic dysfunction (impaired relaxation). Right Ventricle: The right ventricular size is normal. Right ventricular systolic function is normal. There is mildly elevated pulmonary artery systolic pressure. The tricuspid regurgitant velocity is 3.05 m/s, and with an assumed right atrial pressure of 3 mmHg, the estimated right ventricular systolic pressure is 78.2 mmHg. Left Atrium: Left atrial size was mildly dilated. Right Atrium: Right atrial size was  normal in size. Pericardium: There is no evidence of pericardial effusion. Mitral Valve: The mitral valve is normal in structure. Normal mobility of the mitral valve leaflets. Mild mitral valve regurgitation. No evidence of mitral valve stenosis. Tricuspid Valve: The tricuspid valve is normal in structure. Tricuspid valve regurgitation is mild . No evidence of tricuspid stenosis. Aortic Valve: The aortic valve is tricuspid. Aortic valve regurgitation is mild. Aortic regurgitation PHT measures 453 msec. No aortic stenosis is present. Pulmonic Valve: The pulmonic valve was normal in structure. Pulmonic valve regurgitation is trivial. No evidence of pulmonic stenosis. Aorta: The aortic root is normal in size and structure. Venous: The inferior vena cava is normal in size with greater than 50% respiratory variability, suggesting right atrial pressure of 3 mmHg. IAS/Shunts: No atrial level shunt detected by color flow Doppler. Additional Comments: Mild to moderate LV dysfunction; grade 1 diastolic dysfunction; mild LVE; mild AI and MR; mild LAE.  LEFT VENTRICLE PLAX 2D LVIDd:         5.70 cm  Diastology LVIDs:         4.20 cm  LV e' lateral:   6.18 cm/s LV PW:         0.70 cm  LV E/e' lateral: 9.9 LV IVS:        0.70 cm  LV e' medial:    4.43 cm/s LVOT diam:     2.00 cm  LV E/e' medial:  13.8 LV SV:         84 LV SV Index:   48 LVOT Area:     3.14 cm  RIGHT VENTRICLE RV Basal diam:  3.40 cm RV S prime:     10.60 cm/s TAPSE (M-mode): 1.7 cm LEFT ATRIUM           Index       RIGHT ATRIUM           Index LA diam:      4.20 cm 2.39 cm/m  RA Area:     14.30 cm LA Vol (A4C): 51.3 ml 29.20 ml/m RA Volume:   37.90 ml  21.57 ml/m  AORTIC VALVE LVOT Vmax:   109.00 cm/s LVOT Vmean:  69.450 cm/s LVOT VTI:    0.268 m AI PHT:      453 msec  AORTA Ao Root diam: 3.40 cm Ao Asc diam:  3.70 cm MITRAL VALVE               TRICUSPID VALVE MV Area (PHT): 3.27 cm    TR Peak grad:   37.2 mmHg MV Decel Time: 232 msec  TR Vmax:         305.00 cm/s MV E velocity: 61.30 cm/s MV A velocity: 96.80 cm/s  SHUNTS MV E/A ratio:  0.63        Systemic VTI:  0.27 m                            Systemic Diam: 2.00 cm Kirk Ruths MD Electronically signed by Kirk Ruths MD Signature Date/Time: 03/12/2020/11:46:38 AM    Final      Additional studies/ records that were reviewed today include:  I reviewed the patient's records from Dr. Isaac Bliss.   ASSESSMENT:    1. Essential hypertension   2. Hypokalemia   3. Pre-op evaluation   4. Atrial trigeminy   5. Type 2 diabetes mellitus without complication, without long-term current use of insulin ([place])   6. Seasonal asthma     PLAN:  Ms. Markiah Janeway is a very pleasant 79 year old African-American female who is in need for right knee replacement surgery.  She has a history of essential hypertension for greater than 15 years and most recently has been on amlodipine 10 mg daily, benazepril 40 mg and HCTZ 25 mg.  She was seen yesterday by Dr. Isaac Bliss and was noted to have irregularity to her heart rhythm.  Her ECG today shows sinus rhythm with frequent PACs in a trigeminal rhythm.  She also has recently developed diabetes mellitus and prescription for Metformin was involved in today.  Most recent laboratory was reviewed which revealed hypokalemia 3.1.  She is to undergo potassium supplementation.  Scheduling her for a preoperative echo Doppler study to evaluate systolic and diastolic function as well as valvular heart injection.  I am recommending she reduce her HCTZ to 12.5 mg and will initiate metoprolol succinate 25 mg daily.  Tentative surgery date is June 7.  I will work her into my office to be seen in the next 1 to 2 weeks for reevaluation following the above studies prior to giving clearance for surgery.   Medication Adjustments/Labs and Tests Ordered: Current medicines are reviewed at length with the patient today.  Concerns regarding medicines are outlined above.   Medication changes, Labs and Tests ordered today are listed in the Patient Instructions below. Patient Instructions  Medication Instructions:  BEGIN TAKING METOPROLOL SUCCINATE 25MG DAILY  *If you need a refill on your cardiac medications before your next appointment, please call your pharmacy*   Lab Work: IN ONE WEEK: BMET If you have labs (blood work) drawn today and your tests are completely normal, you will receive your results only by: Marland Kitchen MyChart Message (if you have MyChart) OR . A paper copy in the mail If you have any lab test that is abnormal or we need to change your treatment, we will call you to review the results.   Testing/Procedures: NEXT WEEK Your physician has requested that you have an echocardiogram. Echocardiography is a painless test that uses sound waves to create images of your heart. It provides your doctor with information about the size and shape of your heart and how well your heart's chambers and valves are working. This procedure takes approximately one hour. There are no restrictions for this procedure.  [place]   Follow-Up: At [facility], you and your health needs are our priority.  As part of our continuing mission to provide you with exceptional heart care, we have created designated Provider Care Teams.  These  Care Teams include your primary Cardiologist (physician) and Advanced Practice Providers (APPs -  Physician Assistants and Nurse Practitioners) who all work together to provide you with the care you need, when you need it.  We recommend signing up for the patient portal called "MyChart".  Sign up information is provided on this After Visit Summary.  MyChart is used to connect with patients for Virtual Visits (Telemedicine).  Patients are able to view lab/test results, encounter notes, upcoming appointments, etc.  Non-urgent messages can be sent to your provider as well.   To learn more about what you can do with MyChart, go to  [email or web address].    Your next appointment:   PRIOR TO 03/25/20  The format for your next appointment:   In Person  Provider:   You may see DR.Winna Golla or one of the following Advanced Practice Providers on your designated Care Team:    Almyra Deforest, PA-C  Fabian Sharp, [place] or   Roby Lofts, [place]         Signed, Shelva Majestic, MD  03/16/2020 2:48 PM    [facility] [address] [place], [place], [place]  [address] Phone: [phone]

## 2020-03-07 NOTE — Patient Instructions (Signed)
Medication Instructions:  BEGIN TAKING METOPROLOL SUCCINATE 25MG  DAILY  *If you need a refill on your cardiac medications before your next appointment, please call your pharmacy*   Lab Work: IN ONE WEEK: BMET If you have labs (blood work) drawn today and your tests are completely normal, you will receive your results only by: MyChart Message (if you have MyChart) OR . A paper copy in the mail If you have any lab test that is abnormal or we need to change your treatment, we will call you to review the results.   Testing/Procedures: NEXT WEEK Your physician has requested that you have an echocardiogram. Echocardiography is a painless test that uses sound waves to create images of your heart. It provides your doctor with information about the size and shape of your heart and how well your heart's chambers and valves are working. This procedure takes approximately one hour. There are no restrictions for this procedure.  [address]   Follow-Up: At [facility], you and your health needs are our priority.  As part of our continuing mission to provide you with exceptional heart care, we have created designated Provider Care Teams.  These Care Teams include your primary Cardiologist (physician) and Advanced Practice Providers (APPs -  Physician Assistants and Nurse Practitioners) who all work together to provide you with the care you need, when you need it.  We recommend signing up for the patient portal called "MyChart".  Sign up information is provided on this After Visit Summary.  MyChart is used to connect with patients for Virtual Visits (Telemedicine).  Patients are able to view lab/test results, encounter notes, upcoming appointments, etc.  Non-urgent messages can be sent to your provider as well.   To learn more about what you can do with MyChart, go to [facility].    Your next appointment:   PRIOR TO 03/25/20  The format for your next appointment:   In  Person  Provider:   You may see DR.KELLY or one of the following Advanced Practice Providers on your designated Care Team:    05/25/20, PA-C  Azalee Course, PA-C or   Micah Flesher, Judy Pimple

## 2020-03-08 DIAGNOSIS — M25561 Pain in right knee: Secondary | ICD-10-CM | POA: Diagnosis not present

## 2020-03-08 NOTE — Patient Instructions (Addendum)
DUE TO COVID-19 ONLY ONE VISITOR IS ALLOWED TO COME WITH YOU AND STAY IN THE WAITING ROOM ONLY DURING PRE OP AND PROCEDURE DAY OF SURGERY. Two  VISITOR MAY VISIT WITH YOU AFTER SURGERY IN YOUR PRIVATE ROOM DURING VISITING HOURS ONLY! 10a-8p  YOU NEED TO HAVE A COVID 19 TEST ON___6-3-21____ @_12 :00______, THIS TEST MUST BE DONE BEFORE SURGERY, COME  [address], [place] , [id number].  ([place]) ONCE YOUR COVID TEST IS COMPLETED, PLEASE BEGIN THE QUARANTINE INSTRUCTIONS AS OUTLINED IN YOUR HANDOUT.                Kristy [name]  03/08/2020   Your procedure is scheduled on: 03-25-20   Report to [facility] Main  Entrance   Report to admitting at        0550 AM     Call this number if you have problems the morning of surgery [phone]    Remember: NO SOLID FOOD AFTER MIDNIGHT THE NIGHT PRIOR TO SURGERY. NOTHING BY MOUTH EXCEPT CLEAR LIQUIDS UNTIL 0520 am  . PLEASE FINISH G2  DRINK PER SURGEON ORDER  WHICH NEEDS TO BE COMPLETED AT         0520 am then nothing by mouth.    CLEAR LIQUID DIET   Foods Allowed                                                                              Foods Excluded  Coffee and tea, regular and decaf NO CREAMER                            liquids that you cannot  Plain Jell-O any favor except red or purple                                           see through such as: Fruit ices (not with fruit pulp)                                                      milk, soups, orange juice  Iced Popsicles                                                          All solid food Carbonated beverages, regular and diet                                    Cranberry, grape and apple juices Sports drinks like Gatorade Lightly seasoned clear broth or consume(fat free) Sugar, honey syrup_____________________________________________________________________   BRUSH YOUR TEETH MORNING OF SURGERY AND RINSE YOUR MOUTH OUT, NO CHEWING  GUM CANDY OR  MINTS.     Take these medicines the morning of surgery with A SIP OF WATER: metoprolol, inhalers bring with you, flonase  DO NOT TAKE ANY DIABETIC MEDICATIONS DAY OF YOUR SURGERY                               You may not have any metal on your body including hair pins and              piercings  Do not wear jewelry, make-up, lotions, powders or perfumes, deodorant             Do not wear nail polish on your fingernails.  Do not shave  48 hours prior to surgery.         Do not bring valuables to the hospital. [facility] IS NOT             RESPONSIBLE   FOR VALUABLES.  Contacts, dentures or bridgework may not be worn into surgery.      Patients discharged the day of surgery will not be allowed to drive home. IF YOU ARE HAVING SURGERY AND GOING HOME THE SAME DAY, YOU MUST HAVE AN ADULT TO DRIVE YOU HOME AND BE WITH YOU FOR 24 HOURS. YOU MAY GO HOME BY TAXI OR UBER OR ORTHERWISE, BUT AN ADULT MUST ACCOMPANY YOU HOME AND STAY WITH YOU FOR 24 HOURS.  Name and phone number of your driver:  Special Instructions: N/A              Please read over the following fact sheets you were given: _____________________________________________________________________             [facility] - Preparing for Surgery Before surgery, you can play an important role.  Because skin is not sterile, your skin needs to be as free of germs as possible.  You can reduce the number of germs on your skin by washing with CHG (chlorahexidine gluconate) soap before surgery.  CHG is an antiseptic cleaner which kills germs and bonds with the skin to continue killing germs even after washing. Please DO NOT use if you have an allergy to CHG or antibacterial soaps.  If your skin becomes reddened/irritated stop using the CHG and inform your nurse when you arrive at Short Stay. Do not shave (including legs and underarms) for at least 48 hours prior to the first CHG shower.  You may shave your face/neck. Please follow these  instructions carefully:  1.  Shower with CHG Soap the night before surgery and the  morning of Surgery.  2.  If you choose to wash your hair, wash your hair first as usual with your  normal  shampoo.  3.  After you shampoo, rinse your hair and body thoroughly to remove the  shampoo.                           4.  Use CHG as you would any other liquid soap.  You can apply chg directly  to the skin and wash                       Gently with a scrungie or clean washcloth.  5.  Apply the CHG Soap to your body ONLY FROM THE NECK DOWN.   Do not use on face/ open  Wound or open sores. Avoid contact with eyes, ears mouth and genitals (private parts).                       Wash face,  Genitals (private parts) with your normal soap.             6.  Wash thoroughly, paying special attention to the area where your surgery  will be performed.  7.  Thoroughly rinse your body with warm water from the neck down.  8.  DO NOT shower/wash with your normal soap after using and rinsing off  the CHG Soap.                9.  Pat yourself dry with a clean towel.            10.  Wear clean pajamas.            11.  Place clean sheets on your bed the night of your first shower and do not  sleep with pets. Day of Surgery : Do not apply any lotions/deodorants the morning of surgery.  Please wear clean clothes to the hospital/surgery center.  FAILURE TO FOLLOW THESE INSTRUCTIONS MAY RESULT IN THE CANCELLATION OF YOUR SURGERY PATIENT SIGNATURE_________________________________  NURSE SIGNATURE__________________________________  ________________________________________________________________________   Kristy [name]  An incentive spirometer is a tool that can help keep your lungs clear and active. This tool measures how well you are filling your lungs with each breath. Taking long deep breaths may help reverse or decrease the chance of developing breathing (pulmonary) problems (especially  infection) following:  A long period of time when you are unable to move or be active. BEFORE THE PROCEDURE   If the spirometer includes an indicator to show your best effort, your nurse or respiratory therapist will set it to a desired goal.  If possible, sit up straight or lean slightly forward. Try not to slouch.  Hold the incentive spirometer in an upright position. INSTRUCTIONS FOR USE  1. Sit on the edge of your bed if possible, or sit up as far as you can in bed or on a chair. 2. Hold the incentive spirometer in an upright position. 3. Breathe out normally. 4. Place the mouthpiece in your mouth and seal your lips tightly around it. 5. Breathe in slowly and as deeply as possible, raising the piston or the ball toward the top of the column. 6. Hold your breath for 3-5 seconds or for as long as possible. Allow the piston or ball to fall to the bottom of the column. 7. Remove the mouthpiece from your mouth and breathe out normally. 8. Rest for a few seconds and repeat Steps 1 through 7 at least 10 times every 1-2 hours when you are awake. Take your time and take a few normal breaths between deep breaths. 9. The spirometer may include an indicator to show your best effort. Use the indicator as a goal to work toward during each repetition. 10. After each set of 10 deep breaths, practice coughing to be sure your lungs are clear. If you have an incision (the cut made at the time of surgery), support your incision when coughing by placing a pillow or rolled up towels firmly against it. Once you are able to get out of bed, walk around indoors and cough well. You may stop using the incentive spirometer when instructed by your caregiver.  RISKS AND COMPLICATIONS  Take your time so you do not get  dizzy or light-headed.  If you are in pain, you may need to take or ask for pain medication before doing incentive spirometry. It is harder to take a deep breath if you are having pain. AFTER  USE  Rest and breathe slowly and easily.  It can be helpful to keep track of a log of your progress. Your caregiver can provide you with a simple table to help with this. If you are using the spirometer at home, follow these instructions: [place] IF:   You are having difficultly using the spirometer.  You have trouble using the spirometer as often as instructed.  Your pain medication is not giving enough relief while using the spirometer.  You develop fever of 100.5 F (38.1 C) or higher. SEEK IMMEDIATE MEDICAL CARE IF:   You cough up bloody sputum that had not been present before.  You develop fever of 102 F (38.9 C) or greater.  You develop worsening pain at or near the incision site. MAKE SURE YOU:   Understand these instructions.  Will watch your condition.  Will get help right away if you are not doing well or get worse. Document Released: 02/15/2007 Document Revised: 12/28/2011 Document Reviewed: 04/18/2007 Exit[employer] Patient Information 2014 [employer], [place].   ________________________________________________________________________  WHAT IS A BLOOD TRANSFUSION? Blood Transfusion Information  A transfusion is the replacement of blood or some of its parts. Blood is made up of multiple cells which provide different functions.  Red blood cells carry oxygen and are used for blood loss replacement.  White blood cells fight against infection.  Platelets control bleeding.  Plasma helps clot blood.  Other blood products are available for specialized needs, such as hemophilia or other clotting disorders. BEFORE THE TRANSFUSION  Who gives blood for transfusions?   Healthy volunteers who are fully evaluated to make sure their blood is safe. This is blood bank blood. Transfusion therapy is the safest it has ever been in the practice of medicine. Before blood is taken from a donor, a complete history is taken to make sure that person has no history of diseases  nor engages in risky social behavior (examples are intravenous drug use or sexual activity with multiple partners). The donor's travel history is screened to minimize risk of transmitting infections, such as malaria. The donated blood is tested for signs of infectious diseases, such as HIV and hepatitis. The blood is then tested to be sure it is compatible with you in order to minimize the chance of a transfusion reaction. If you or a relative donates blood, this is often done in anticipation of surgery and is not appropriate for emergency situations. It takes many days to process the donated blood. RISKS AND COMPLICATIONS Although transfusion therapy is very safe and saves many lives, the main dangers of transfusion include:   Getting an infectious disease.  Developing a transfusion reaction. This is an allergic reaction to something in the blood you were given. Every precaution is taken to prevent this. The decision to have a blood transfusion has been considered carefully by your caregiver before blood is given. Blood is not given unless the benefits outweigh the risks. AFTER THE TRANSFUSION  Right after receiving a blood transfusion, you will usually feel much better and more energetic. This is especially true if your red blood cells have gotten low (anemic). The transfusion raises the level of the red blood cells which carry oxygen, and this usually causes an energy increase.  The nurse administering the transfusion will  monitor you carefully for complications. HOME CARE INSTRUCTIONS  No special instructions are needed after a transfusion. You may find your energy is better. Speak with your caregiver about any limitations on activity for underlying diseases you may have. SEEK MEDICAL CARE IF:   Your condition is not improving after your transfusion.  You develop redness or irritation at the intravenous (IV) site. SEEK IMMEDIATE MEDICAL CARE IF:  Any of the following symptoms occur over the  next 12 hours:  Shaking chills.  You have a temperature by mouth above 102 F (38.9 C), not controlled by medicine.  Chest, back, or muscle pain.  People around you feel you are not acting correctly or are confused.  Shortness of breath or difficulty breathing.  Dizziness and fainting.  You get a rash or develop hives.  You have a decrease in urine output.  Your urine turns a dark color or changes to pink, red, or brown. Any of the following symptoms occur over the next 10 days:  You have a temperature by mouth above 102 F (38.9 C), not controlled by medicine.  Shortness of breath.  Weakness after normal activity.  The white part of the eye turns yellow (jaundice).  You have a decrease in the amount of urine or are urinating less often.  Your urine turns a dark color or changes to pink, red, or brown. Document Released: 10/02/2000 Document Revised: 12/28/2011 Document Reviewed: 05/21/2008 [facility] Patient Information 2014 [place], [place].  _______________________________________________________________________

## 2020-03-08 NOTE — Progress Notes (Signed)
PCP - Dr. Minda Meo 03-06-20  Cardiologist -  Nicki Guadalajara lov 03-07-20 clearance pending echo f/u appt 03-12-20  Chest x-ray -04-19-19 epic  EKG - 03-06-20 epic Stress Test -  ECHO - 03-12-20 Cardiac Cath -  hgba1c 03-07-19 epic 6.6 Sleep Study -  CPAP -   Fasting Blood Sugar -  Checks Blood Sugar _____ times a day  Blood Thinner Instructions: Aspirin Instructions: Last Dose:  Anesthesia review:   Patient denies shortness of breath, fever, cough and chest pain at PAT appointment none   Patient verbalized understanding of instructions that were given to them at the PAT appointment. Patient was also instructed that they will need to review over the PAT instructions again at home before surgery.

## 2020-03-11 ENCOUNTER — Encounter (HOSPITAL_COMMUNITY)
Admission: RE | Admit: 2020-03-11 | Discharge: 2020-03-11 | Disposition: A | Discharge status: Home / Self Care | Payer: Medicare Other | Source: Ambulatory Visit | Attending: Orthopedic Surgery | Admitting: Orthopedic Surgery

## 2020-03-11 ENCOUNTER — Other Ambulatory Visit: Payer: Self-pay

## 2020-03-11 ENCOUNTER — Encounter (HOSPITAL_COMMUNITY): Payer: Self-pay

## 2020-03-11 DIAGNOSIS — Z01818 Encounter for other preprocedural examination: Secondary | ICD-10-CM | POA: Insufficient documentation

## 2020-03-11 HISTORY — DX: Cardiac arrhythmia, unspecified: I49.9

## 2020-03-11 HISTORY — DX: Type 2 diabetes mellitus without complications: E11.9

## 2020-03-11 HISTORY — DX: Pneumonia, unspecified organism: J18.9

## 2020-03-12 ENCOUNTER — Telehealth: Payer: Self-pay | Admitting: Internal Medicine

## 2020-03-12 ENCOUNTER — Other Ambulatory Visit: Payer: Self-pay | Admitting: Internal Medicine

## 2020-03-12 ENCOUNTER — Ambulatory Visit (HOSPITAL_COMMUNITY): Payer: Medicare Other | Attending: Cardiology

## 2020-03-12 DIAGNOSIS — I10 Essential (primary) hypertension: Secondary | ICD-10-CM | POA: Diagnosis not present

## 2020-03-12 DIAGNOSIS — E876 Hypokalemia: Secondary | ICD-10-CM | POA: Diagnosis not present

## 2020-03-12 DIAGNOSIS — E119 Type 2 diabetes mellitus without complications: Secondary | ICD-10-CM

## 2020-03-12 DIAGNOSIS — Z01818 Encounter for other preprocedural examination: Secondary | ICD-10-CM | POA: Diagnosis not present

## 2020-03-12 NOTE — Telephone Encounter (Signed)
Reviewed lab results with patient.  Referral placed.

## 2020-03-12 NOTE — Telephone Encounter (Signed)
Pt returning your call

## 2020-03-13 DIAGNOSIS — E876 Hypokalemia: Secondary | ICD-10-CM | POA: Diagnosis not present

## 2020-03-13 DIAGNOSIS — Z01818 Encounter for other preprocedural examination: Secondary | ICD-10-CM | POA: Diagnosis not present

## 2020-03-13 DIAGNOSIS — I10 Essential (primary) hypertension: Secondary | ICD-10-CM | POA: Diagnosis not present

## 2020-03-14 LAB — BASIC METABOLIC PANEL
BUN/Creatinine Ratio: 19 (ref 12–28)
BUN: 17 mg/dL (ref 8–27)
CO2: 21 mmol/L (ref 20–29)
Calcium: 9.7 mg/dL (ref 8.7–10.3)
Chloride: 102 mmol/L (ref 96–106)
Creatinine, Ser: 0.9 mg/dL (ref 0.57–1.00)
GFR calc Af Amer: 70 mL/min/{1.73_m2} (ref >59)
GFR calc non Af Amer: 61 mL/min/{1.73_m2} (ref >59)
Glucose: 105 mg/dL — ABNORMAL HIGH (ref 65–99)
Potassium: 3.9 mmol/L (ref 3.5–5.2)
Sodium: 139 mmol/L (ref 134–144)

## 2020-03-15 ENCOUNTER — Encounter: Payer: Self-pay | Admitting: Cardiovascular Disease

## 2020-03-15 ENCOUNTER — Ambulatory Visit (INDEPENDENT_AMBULATORY_CARE_PROVIDER_SITE_OTHER): Payer: Medicare Other | Admitting: Cardiovascular Disease

## 2020-03-15 ENCOUNTER — Encounter (HOSPITAL_COMMUNITY)
Admission: RE | Admit: 2020-03-15 | Discharge: 2020-03-15 | Disposition: A | Discharge status: Home / Self Care | Payer: Medicare Other | Source: Ambulatory Visit | Attending: Orthopedic Surgery | Admitting: Orthopedic Surgery

## 2020-03-15 ENCOUNTER — Other Ambulatory Visit: Payer: Self-pay

## 2020-03-15 VITALS — BP 120/68 | HR 81 | Ht 61.0 in | Wt 165.2 lb

## 2020-03-15 DIAGNOSIS — I10 Essential (primary) hypertension: Secondary | ICD-10-CM | POA: Diagnosis not present

## 2020-03-15 DIAGNOSIS — Z1231 Encounter for screening mammogram for malignant neoplasm of breast: Secondary | ICD-10-CM | POA: Diagnosis not present

## 2020-03-15 DIAGNOSIS — R008 Other abnormalities of heart beat: Secondary | ICD-10-CM

## 2020-03-15 DIAGNOSIS — Z01818 Encounter for other preprocedural examination: Secondary | ICD-10-CM

## 2020-03-15 DIAGNOSIS — I351 Nonrheumatic aortic (valve) insufficiency: Secondary | ICD-10-CM

## 2020-03-15 DIAGNOSIS — Z01812 Encounter for preprocedural laboratory examination: Secondary | ICD-10-CM | POA: Diagnosis not present

## 2020-03-15 DIAGNOSIS — E876 Hypokalemia: Secondary | ICD-10-CM

## 2020-03-15 DIAGNOSIS — I429 Cardiomyopathy, unspecified: Secondary | ICD-10-CM | POA: Diagnosis not present

## 2020-03-15 DIAGNOSIS — E119 Type 2 diabetes mellitus without complications: Secondary | ICD-10-CM

## 2020-03-15 DIAGNOSIS — I340 Nonrheumatic mitral (valve) insufficiency: Secondary | ICD-10-CM | POA: Diagnosis not present

## 2020-03-15 LAB — CBC
HCT: 37.4 % (ref 36.0–46.0)
Hemoglobin: 11.6 g/dL — ABNORMAL LOW (ref 12.0–15.0)
MCH: 26.5 pg (ref 26.0–34.0)
MCHC: 31 g/dL (ref 30.0–36.0)
MCV: 85.6 fL (ref 80.0–100.0)
Platelets: 348 10*3/uL (ref 150–400)
RBC: 4.37 MIL/uL (ref 3.87–5.11)
RDW: 18.6 % — ABNORMAL HIGH (ref 11.5–15.5)
WBC: 7.3 10*3/uL (ref 4.0–10.5)
nRBC: 0 % (ref 0.0–0.2)

## 2020-03-15 LAB — APTT: aPTT: 31 seconds (ref 24–36)

## 2020-03-15 LAB — HM MAMMOGRAPHY

## 2020-03-15 LAB — COMPREHENSIVE METABOLIC PANEL
ALT: 16 U/L (ref 0–44)
AST: 19 U/L (ref 15–41)
Albumin: 4.2 g/dL (ref 3.5–5.0)
Alkaline Phosphatase: 54 U/L (ref 38–126)
Anion gap: 12 (ref 5–15)
BUN: 19 mg/dL (ref 8–23)
CO2: 26 mmol/L (ref 22–32)
Calcium: 9.4 mg/dL (ref 8.9–10.3)
Chloride: 103 mmol/L (ref 98–111)
Creatinine, Ser: 0.81 mg/dL (ref 0.44–1.00)
GFR calc Af Amer: >60 mL/min (ref >60)
GFR calc non Af Amer: >60 mL/min (ref >60)
Glucose, Bld: 92 mg/dL (ref 70–99)
Potassium: 3.9 mmol/L (ref 3.5–5.1)
Sodium: 141 mmol/L (ref 135–145)
Total Bilirubin: 0.5 mg/dL (ref 0.3–1.2)
Total Protein: 7.6 g/dL (ref 6.5–8.1)

## 2020-03-15 LAB — ABO/RH: ABO/RH(D): O POS

## 2020-03-15 LAB — PROTIME-INR
INR: 1 (ref 0.8–1.2)
Prothrombin Time: 12.8 seconds (ref 11.4–15.2)

## 2020-03-15 LAB — GLUCOSE, CAPILLARY: Glucose-Capillary: 86 mg/dL (ref 70–99)

## 2020-03-15 LAB — SURGICAL PCR SCREEN
MRSA, PCR: NEGATIVE
Staphylococcus aureus: NEGATIVE

## 2020-03-15 MED ORDER — METOPROLOL SUCCINATE ER 25 MG PO TB24
50.0000 mg | ORAL_TABLET | Freq: Every day | ORAL | 3 refills | Status: DC
Start: 2020-03-15 — End: 2020-04-17

## 2020-03-15 MED ORDER — HYDROCHLOROTHIAZIDE 12.5 MG PO TABS: 12.5000 mg | ORAL_TABLET | ORAL | 3 refills | Status: DC | PRN

## 2020-03-15 NOTE — Patient Instructions (Signed)
Medication Instructions:  INCREASE YOUR METOPROLOL- FOR THE FIRST 4 DAYS TAKE 1.5 TABS (37.5MG ) THEN INCREASE TO 2 WHOLE TABS (50MG )  CHANGE YOUR HCTZ TO 12.5MG  AS NEEDED FOR SWELLING  *If you need a refill on your cardiac medications before your next appointment, please call your pharmacy*   Testing/Procedures: LEXI SCAN NEXT WEEK Your physician has requested that you have a lexiscan myoview. For further information please visit . Please follow instruction sheet, as given.     Follow-Up: At [facility], you and your health needs are our priority.  As part of our continuing mission to provide you with exceptional heart care, we have created designated Provider Care Teams.  These Care Teams include your primary Cardiologist (physician) and Advanced Practice Providers (APPs -  Physician Assistants and Nurse Practitioners) who all work together to provide you with the care you need, when you need it.  We recommend signing up for the patient portal called "MyChart".  Sign up information is provided on this After Visit Summary.  MyChart is used to connect with patients for Virtual Visits (Telemedicine).  Patients are able to view lab/test results, encounter notes, upcoming appointments, etc.  Non-urgent messages can be sent to your provider as well.   To learn more about what you can do with MyChart, go to [facility].    Your next appointment:   2-3 week(s)  The format for your next appointment:   In Person  Provider:   [email or web address], MD

## 2020-03-16 ENCOUNTER — Encounter: Payer: Self-pay | Admitting: Cardiovascular Disease

## 2020-03-16 NOTE — Progress Notes (Signed)
Cardiology Office Note    Date:  03/16/2020   ID:  Kristy [name], DOB [birth year], MRN [id number]  PCP:  Isaac Bliss, Rayford Halsted, MD  Cardiologist:  Shelva Majestic, MD   F/Ucardiology evaluation referred through the courtesy of Dr. Isaac Bliss for preoperative evaluation prior to undergoing elective knee replacement surgery.  History of Present Illness:  Kristy [name] is a 79 y.o. female who is followed by Dr. Isaac Bliss for primary care.  She had previously undergone right knee arthroscopy and apparently is scheduled to undergo right total knee replacement electively on June 7.  She has had significant difficulty with walking and has been using a cane.  Previously in the past she had enjoyed Zumba as well as bowling.  She was seen yesterday by Dr. Isaac Bliss for preoperative clearance.  During her evaluation she was noticed to have frequent skipping beats and palpitations.  An ECG showed sinus rhythm with frequent PACs with a ventricular rate around 85 without acute ST-T changes.  She has a history of essential hypertension.  She was worked into my schedule today to be seen for preoperative cardiology clearance prior to her elective surgery.  A 2D echo Doppler study had been scheduled.  I saw the patient for initial cardiology evaluation on Mar 07, 2020.  At that time, she denied any chest pain or chest tightness and denied any significant shortness of breath.  She has a history of hypertension for greater than 15 years and was on a regimen consisting of amlodipine 10 mg, benazepril 40 mg and was on hydrochlorothiazide 25 mg daily.   Laboratory drawn the day before her evaluation with me had shown a hemoglobin A1c at 6.6 and Dr. Isaac Bliss had called in a prescription for metformin to initiate 500 mg twice a day effective today.  In addition she was found to be hypokalemic with a potassium of 3.1 and supplemental potassium 40 mEq daily for 3 days was ordered.  She was unaware  of any  heart rate irregularity.  I reviewed the EKG from Dr. Cresenciano Lick evaluation which showed every fourth beat being a PAC.  Ventricular rate was 85 bpm.  She denies PND orthopnea.  She denies presyncope or syncope.  She is unaware of any significant cardiac murmur.  During my evaluation, her blood pressure was slightly elevated at 144/70 and her ECG showed atrial trigeminy.  At that time I recommended she decrease HCTZ to 12.5 mg and started her on metoprolol succinate 25 mg daily.  She underwent an echo Doppler study on Mar 12, 2020.  This revealed reduced LV function with an EF of 40 to 45% in a global hypokinetic pattern.  Left-ventricular internal cavity size is mildly dilated.  There is grade 1 diastolic dysfunction.  She had mildly elevated pulmonic artery systolic pressure.  There was mild left atrial dilatation.  There is evidence for a tricuspid aortic valve with mild aortic insufficiency.  There is mild mitral regurgitation.  She underwent follow-up laboratory which revealed improvement in her hypokalemia with potassium at 3.9.  She presents for reevaluation prior to undergoing her elective knee surgery.   Past Medical History:  Diagnosis Date  . Allergic rhinitis, seasonal   . Arthritis   . Asthma    bronchial with weather change  . Diabetes mellitus without complication ([place])    type 2   . Dysrhythmia    "Skipping a beat"  . GERD (gastroesophageal reflux disease)   . History of  colon polyps   . Hypertension   . Pneumonia   . Rotator cuff tear, right     Past Surgical History:  Procedure Laterality Date  . BACK SURGERY     x3  ruptured disc   . CHOLECYSTECTOMY OPEN  AGE 89  . EYE SURGERY     bil cataracts  . INGUINAL HERNIA REPAIR Right AGE 89s  . KNEE ARTHROSCOPY Left 2014  . LUMBAR [place] SURGERY  x2  LAST DATE EARLY 2000s  . ORIF ANKLE FRACTURE Right 06/24/2018   Procedure: OPEN REDUCTION INTERNAL FIXATION (ORIF) RIGHT ANKLE FRACTURE;  Surgeon: Mcarthur Rossetti, MD;  Location: [facility];  Service: Orthopedics;  Laterality: Right;  . ROTATOR CUFF REPAIR Left 01/2015  . TONSILLECTOMY  AGE 62  . VAGINAL HYSTERECTOMY  AGE 49   WITH BSO    Current Medications: Outpatient Medications Prior to Visit  Medication Sig Dispense Refill  . albuterol (VENTOLIN HFA) 108 (90 Base) MCG/ACT inhaler TAKE 2 PUFFS BY MOUTH EVERY 6 HOURS AS NEEDED FOR WHEEZE (Patient taking differently: Inhale 2 puffs into the lungs every 6 (six) hours as needed for wheezing. ) 6.7 g 1  . amLODipine (NORVASC) 10 MG tablet TAKE 1 TABLET BY MOUTH EVERY DAY (Patient taking differently: Take 10 mg by mouth at bedtime. ) 90 tablet 1  . benazepril (LOTENSIN) 40 MG tablet TAKE 1 TABLET BY MOUTH EVERY DAY (Patient taking differently: Take 40 mg by mouth every evening. ) 90 tablet 1  . celecoxib (CELEBREX) 200 MG capsule TAKE 1 CAPSULE BY MOUTH EVERY DAY (Patient taking differently: Take 200 mg by mouth at bedtime. ) 90 capsule 1  . ferrous sulfate 325 (65 FE) MG tablet Take 325 mg by mouth daily with breakfast.    . fluticasone (FLONASE) 50 MCG/ACT nasal spray SPRAY 2 SPRAYS INTO EACH NOSTRIL EVERY DAY (Patient taking differently: Place 2 sprays into both nostrils daily. ) 48 mL 0  . metFORMIN (GLUCOPHAGE) 500 MG tablet Take 1 tablet (500 mg total) by mouth 2 (two) times daily with a meal. 180 tablet 1  . traMADol (ULTRAM) 50 MG tablet TAKE 1 TABLET (50 MG TOTAL) BY MOUTH EVERY 12 (TWELVE) HOURS AS NEEDED. (Patient taking differently: Take 50 mg by mouth every 12 (twelve) hours as needed (pain.). ) 60 tablet 0  . WIXELA INHUB 250-50 MCG/DOSE AEPB INHALE 1 PUFF BY MOUTH EVERY 12 HOURS (Patient taking differently: Inhale 1 puff into the lungs 2 (two) times daily as needed (respiratory issues). ) 180 each 1  . hydrochlorothiazide (HYDRODIURIL) 25 MG tablet Take 1 tablet (25 mg total) by mouth daily. (Patient taking differently: Take 25 mg by mouth at bedtime. ) 90 tablet 0  . metoprolol  succinate (TOPROL XL) 25 MG 24 hr tablet Take 1 tablet (25 mg total) by mouth daily. 90 tablet 3   No facility-administered medications prior to visit.     Allergies:   Clonidine   Social History   Socioeconomic History  . Marital status: Widowed    Spouse name: Not on file  . Number of children: Not on file  . Years of education: Not on file  . Highest education level: Not on file  Occupational History  . Not on file  Tobacco Use  . Smoking status: Former Smoker    Years: 10.00    Quit date: 10/19/1988    Years since quitting: 31.4  . Smokeless tobacco: Never Used  . Tobacco comment: 1 a day  Substance  and Sexual Activity  . Alcohol use: Yes    Alcohol/week: 3.0 - 4.0 standard drinks    Types: 3 - 4 Glasses of wine per week    Comment: 2-3 times a week  . Drug use: Never  . Sexual activity: Not Currently  Other Topics Concern  . Not on file  Social History Narrative  . Not on file   Social Determinants of Health   Financial Resource Strain:   . Difficulty of Paying Living Expenses:   Food Insecurity:   . Worried About Charity fundraiser in the Last Year:   . Arboriculturist in the Last Year:   Transportation Needs:   . Film/video editor (Medical):   Marland Kitchen Lack of Transportation (Non-Medical):   Physical Activity:   . Days of Exercise per Week:   . Minutes of Exercise per Session:   Stress:   . Feeling of Stress :   Social Connections:   . Frequency of Communication with Friends and Family:   . Frequency of Social Gatherings with Friends and Family:   . Attends Religious Services:   . Active Member of Clubs or Organizations:   . Attends Archivist Meetings:   Marland Kitchen Marital Status:     Additional social history: She is widowed for 1 year.  She has 4 children, 12 grandchildren and 10 great-grandchildren.  She is retired.  Previously she was a Librarian, academic for [employer].  She has associated college degree.  Remotely she had smoked for 5 years.  She quit in  2001.  She does drink occasional wine.  Used to be very active but this has been limited due to her progressive knee pain  Family History: Family history is notable that her mother died at age 58 with a stroke.  Father died at age 17.  Maternal grandmother died at 89 with natural causes.  She has 4 brothers and 3 sisters.  ROS General: Negative; No fevers, chills, or night sweats;  HEENT: Negative; No changes in vision or hearing, sinus congestion, difficulty swallowing Pulmonary: History of allergy induced bronchial asthma Cardiovascular: See HPI GI: Negative; No nausea, vomiting, diarrhea, or abdominal pain GU: Negative; No dysuria, hematuria, or difficulty voiding Musculoskeletal: Positive for right knee discomfort Hematologic/Oncology: Negative; no easy bruising, bleeding Endocrine: Positive for diabetes Neuro: Negative; no changes in balance, headaches Skin: Negative; No rashes or skin lesions Psychiatric: Negative; No behavioral problems, depression Sleep: Negative; No snoring, daytime sleepiness, hypersomnolence, bruxism, restless legs, hypnogognic hallucinations, no cataplexy Other comprehensive 14 point system review is negative.   PHYSICAL EXAM:   VS:  BP 120/68   Pulse 81   Ht '5\' 1"'$  (1.549 m)   Wt 165 lb 3.2 oz (74.9 kg)   SpO2 98%   BMI 31.21 kg/m     Repeat blood pressure by me was 120/68.  Pulse 81.  Wt Readings from Last 3 Encounters:  03/15/20 163 lb 9 oz (74.2 kg)  03/15/20 165 lb 3.2 oz (74.9 kg)  03/11/20 164 lb (74.4 kg)    General: Alert, oriented, no distress.  Skin: normal turgor, no rashes, warm and dry HEENT: Normocephalic, atraumatic. Pupils equal round and reactive to light; sclera anicteric; extraocular muscles intact; Nose without nasal septal hypertrophy Mouth/Parynx benign; Mallinpatti scale 3 Neck: No JVD, no carotid bruits; normal carotid upstroke Lungs: clear to ausculatation and percussion; no wheezing or rales Chest wall: without  tenderness to palpitation Heart: PMI not displaced, ectopy in a trigeminal rhythm,  s1 s2 normal, 1/6 systolic murmur, no diastolic murmur, no rubs, gallops, thrills, or heaves Abdomen: soft, nontender; no hepatosplenomehaly, BS+; abdominal aorta nontender and not dilated by palpation. Back: no CVA tenderness Pulses 2+ Musculoskeletal: Right knee brace Extremities: Right knee brace;no clubbing cyanosis or edema, Homan's sign negative  Neurologic: grossly nonfocal; Cranial nerves grossly wnl Psychologic: Normal mood and affect    Studies/Labs Reviewed:   EKG:  EKG is ordered today.  ECG (independently read by me): Sinus rhythm at 78 bpm with PACs and atrial trigeminal rhythm.  QTc interval '3 7 3 '$ ms.  Parable 146 ms  Mar 07, 2020 ECG (independently read by me): Sinus rhythm with PACs and atrial trigeminal rhythm pattern.  QTc interval 446 ms  Recent Labs: BMP Latest Ref Rng & Units 03/15/2020 03/13/2020 03/06/2020  Glucose 70 - 99 mg/dL 92 105(H) 121(H)  BUN 8 - 23 mg/dL '19 17 20  '$ Creatinine 0.44 - 1.00 mg/dL 0.81 0.90 0.76  BUN/Creat Ratio 12 - 28 - 19 -  Sodium 135 - 145 mmol/L 141 139 139  Potassium 3.5 - 5.1 mmol/L 3.9 3.9 3.1(L)  Chloride 98 - 111 mmol/L 103 102 100  CO2 22 - 32 mmol/L '26 21 29  '$ Calcium 8.9 - 10.3 mg/dL 9.4 9.7 10.1     Hepatic Function Latest Ref Rng & Units 03/15/2020 03/06/2020 04/19/2019  Total Protein 6.5 - 8.1 g/dL 7.6 8.0 7.5  Albumin 3.5 - 5.0 g/dL 4.2 4.6 4.1  AST 15 - 41 U/L '19 15 16  '$ ALT 0 - 44 U/L '16 12 14  '$ Alk Phosphatase 38 - 126 U/L 54 61 49  Total Bilirubin 0.3 - 1.2 mg/dL 0.5 0.3 0.3  Bilirubin, Direct 0.0 - 0.3 mg/dL - - -    CBC Latest Ref Rng & Units 03/15/2020 03/06/2020 04/19/2019  WBC 4.0 - 10.5 K/uL 7.3 7.9 9.3  Hemoglobin 12.0 - 15.0 g/dL 11.6(L) 11.7(L) 12.9  Hematocrit 36.0 - 46.0 % 37.4 35.5(L) 39.8  Platelets 150 - 400 K/uL 348 385.0 321   Lab Results  Component Value Date   MCV 85.6 03/15/2020   MCV 81.0 03/06/2020   MCV 93.4  04/19/2019   Lab Results  Component Value Date   TSH 2.37 03/06/2020   Lab Results  Component Value Date   HGBA1C 6.6 (H) 03/06/2020     BNP No results found for: BNP  ProBNP No results found for: PROBNP   Lipid Panel     Component Value Date/Time   CHOL 197 03/14/2019 0810   TRIG 98.0 03/14/2019 0810   TRIG 82 09/30/2006 0943   HDL 60.90 03/14/2019 0810   CHOLHDL 3 03/14/2019 0810   VLDL 19.6 03/14/2019 0810   LDLCALC 116 (H) 03/14/2019 0810   LDLDIRECT 118.8 03/30/2013 1145     RADIOLOGY: ECHOCARDIOGRAM COMPLETE  Result Date: 03/12/2020    ECHOCARDIOGRAM REPORT   Patient Name:   MARKIYAH GAHM Kerrigan Date of Exam: 03/12/2020 Medical Rec #:  [id number]     Height:       62.0 in Accession #:    [id number]    Weight:       164.0 lb Date of Birth:  Aug 14, 1941     BSA:          1.757 m Patient Age:    60 years      BP:           132/64 mmHg Patient Gender: F  HR:           75 bpm. Exam Location:  [address] Procedure: 2D Echo, 3D Echo, Cardiac Doppler, Color Doppler and Strain Analysis Indications:    Z01.818 Pre-op evaluation  History:        Patient has no prior history of Echocardiogram examinations.                 Risk Factors:Hypertension, Diabetes and Former Smoker.  Sonographer:    Jessee Avers, RDCS Referring Phys: [place]  1. Mild to moderate LV dysfunction; grade 1 diastolic dysfunction; mild LVE; mild AI and MR; mild LAE.  2. Left ventricular ejection fraction, by estimation, is 40 to 45%. The left ventricle has mildly decreased function. The left ventricle demonstrates global hypokinesis. The left ventricular internal cavity size was mildly dilated. Left ventricular diastolic parameters are consistent with Grade I diastolic dysfunction (impaired relaxation).  3. Right ventricular systolic function is normal. The right ventricular size is normal. There is mildly elevated pulmonary artery systolic pressure.  4. Left atrial size was mildly  dilated.  5. The mitral valve is normal in structure. Mild mitral valve regurgitation. No evidence of mitral stenosis.  6. The aortic valve is tricuspid. Aortic valve regurgitation is mild. No aortic stenosis is present.  7. The inferior vena cava is normal in size with greater than 50% respiratory variability, suggesting right atrial pressure of 3 mmHg. FINDINGS  Left Ventricle: Left ventricular ejection fraction, by estimation, is 40 to 45%. The left ventricle has mildly decreased function. The left ventricle demonstrates global hypokinesis. The left ventricular internal cavity size was mildly dilated. There is  no left ventricular hypertrophy. Left ventricular diastolic parameters are consistent with Grade I diastolic dysfunction (impaired relaxation). Right Ventricle: The right ventricular size is normal. Right ventricular systolic function is normal. There is mildly elevated pulmonary artery systolic pressure. The tricuspid regurgitant velocity is 3.05 m/s, and with an assumed right atrial pressure of 3 mmHg, the estimated right ventricular systolic pressure is 46.9 mmHg. Left Atrium: Left atrial size was mildly dilated. Right Atrium: Right atrial size was normal in size. Pericardium: There is no evidence of pericardial effusion. Mitral Valve: The mitral valve is normal in structure. Normal mobility of the mitral valve leaflets. Mild mitral valve regurgitation. No evidence of mitral valve stenosis. Tricuspid Valve: The tricuspid valve is normal in structure. Tricuspid valve regurgitation is mild . No evidence of tricuspid stenosis. Aortic Valve: The aortic valve is tricuspid. Aortic valve regurgitation is mild. Aortic regurgitation PHT measures 453 msec. No aortic stenosis is present. Pulmonic Valve: The pulmonic valve was normal in structure. Pulmonic valve regurgitation is trivial. No evidence of pulmonic stenosis. Aorta: The aortic root is normal in size and structure. Venous: The inferior vena cava is  normal in size with greater than 50% respiratory variability, suggesting right atrial pressure of 3 mmHg. IAS/Shunts: No atrial level shunt detected by color flow Doppler. Additional Comments: Mild to moderate LV dysfunction; grade 1 diastolic dysfunction; mild LVE; mild AI and MR; mild LAE.  LEFT VENTRICLE PLAX 2D LVIDd:         5.70 cm  Diastology LVIDs:         4.20 cm  LV e' lateral:   6.18 cm/s LV PW:         0.70 cm  LV E/e' lateral: 9.9 LV IVS:        0.70 cm  LV e' medial:    4.43 cm/s  LVOT diam:     2.00 cm  LV E/e' medial:  13.8 LV SV:         84 LV SV Index:   48 LVOT Area:     3.14 cm  RIGHT VENTRICLE RV Basal diam:  3.40 cm RV S prime:     10.60 cm/s TAPSE (M-mode): 1.7 cm LEFT ATRIUM           Index       RIGHT ATRIUM           Index LA diam:      4.20 cm 2.39 cm/m  RA Area:     14.30 cm LA Vol (A4C): 51.3 ml 29.20 ml/m RA Volume:   37.90 ml  21.57 ml/m  AORTIC VALVE LVOT Vmax:   109.00 cm/s LVOT Vmean:  69.450 cm/s LVOT VTI:    0.268 m AI PHT:      453 msec  AORTA Ao Root diam: 3.40 cm Ao Asc diam:  3.70 cm MITRAL VALVE               TRICUSPID VALVE MV Area (PHT): 3.27 cm    TR Peak grad:   37.2 mmHg MV Decel Time: 232 msec    TR Vmax:        305.00 cm/s MV E velocity: 61.30 cm/s MV A velocity: 96.80 cm/s  SHUNTS MV E/A ratio:  0.63        Systemic VTI:  0.27 m                            Systemic Diam: 2.00 cm Kirk Ruths MD Electronically signed by Kirk Ruths MD Signature Date/Time: 03/12/2020/11:46:38 AM    Final      Additional studies/ records that were reviewed today include:  I reviewed the patient's records from Dr. Isaac Bliss.  ECHO 03/12/2020 IMPRESSIONS  1. Mild to moderate LV dysfunction; grade 1 diastolic dysfunction; mild  LVE; mild AI and MR; mild LAE.  2. Left ventricular ejection fraction, by estimation, is 40 to 45%. The  left ventricle has mildly decreased function. The left ventricle  demonstrates global hypokinesis. The left ventricular internal  cavity size  was mildly dilated. Left ventricular  diastolic parameters are consistent with Grade I diastolic dysfunction  (impaired relaxation).  3. Right ventricular systolic function is normal. The right ventricular  size is normal. There is mildly elevated pulmonary artery systolic  pressure.  4. Left atrial size was mildly dilated.  5. The mitral valve is normal in structure. Mild mitral valve  regurgitation. No evidence of mitral stenosis.  6. The aortic valve is tricuspid. Aortic valve regurgitation is mild. No  aortic stenosis is present.  7. The inferior vena cava is normal in size with greater than 50%  respiratory variability, suggesting right atrial pressure of 3 mmHg.    ASSESSMENT:    1. Essential hypertension   2. Atrial trigeminy   3. Cardiomyopathy, unspecified type ([place])   4. Pre-op evaluation   5. Nonrheumatic aortic valve insufficiency   6. Mild mitral regurgitation   7. Type 2 diabetes mellitus without complication, without long-term current use of insulin ([place])   8. Hypokalemia     PLAN:  Ms. Lisa Milian is a very pleasant 79 year old African-American female who is in need for right knee replacement surgery.  She has a history of essential hypertension for greater than 15 years and most recently has been on amlodipine 10  mg daily, benazepril 40 mg and HCTZ 25 mg.  She was seen yesterday by Dr. Isaac Bliss and was noted to have irregularity to her heart rhythm.  When I saw her for initial preoperative evaluation on Mar 07, 2020, her ECG showed sinus rhythm with atrial trigeminy.  Potassium drawn the day prior to her initial evaluation was low at 3.1 and hemoglobin A1c was increased and she was started on metformin and given potassium replacement.  During that initial evaluation IV metoprolol succinate 25 mg daily and recommended reduction of her HCTZ.  Subsequent laboratory has shown normalization of potassium.  An echo Doppler study was performed which  reveals reduced LV function EF 40 -45% with mild LV dilation.  There is also evidence for aortic insufficiency as well as mitral regurgitation.  With her reduction in LV function, I am recommending she undergo a preoperative Lexiscan Myoview study.  I will schedule this to be done over the next several weeks as schedule allows.  As result I have recommended she defer her elective knee replacement which tentatively is set for June 7 until later in the month.  I am also recommending discontinuance of her hydrochlorothiazide and I recommended she take this only on an as-needed basis.  I have further titrated metoprolol succinate to 37.5 mg for the next 4 days then she will increase this to 50 mg daily.  If her [place] study does not show ischemia or any significant abnormality she will be given clearance for her elective knee surgery.  I will see her in several weeks for follow-up evaluation.  Medication Adjustments/Labs and Tests Ordered: Current medicines are reviewed at length with the patient today.  Concerns regarding medicines are outlined above.  Medication changes, Labs and Tests ordered today are listed in the Patient Instructions below. Patient Instructions  Medication Instructions:  INCREASE YOUR METOPROLOL- FOR THE FIRST 4 DAYS TAKE 1.5 TABS (37.'5MG'$ ) THEN INCREASE TO 2 WHOLE TABS ('50MG'$ )  CHANGE YOUR HCTZ TO 12.'5MG'$  AS NEEDED FOR SWELLING  *If you need a refill on your cardiac medications before your next appointment, please call your pharmacy*   Testing/Procedures: [place] Your physician has requested that you have a lexiscan myoview. For further information please visit [email or web address]. Please follow instruction sheet, as given.     Follow-Up: At [facility], you and your health needs are our priority.  As part of our continuing mission to provide you with exceptional heart care, we have created designated Provider Care Teams.  These Care Teams include your  primary Cardiologist (physician) and Advanced Practice Providers (APPs -  Physician Assistants and Nurse Practitioners) who all work together to provide you with the care you need, when you need it.  We recommend signing up for the patient portal called "MyChart".  Sign up information is provided on this After Visit Summary.  MyChart is used to connect with patients for Virtual Visits (Telemedicine).  Patients are able to view lab/test results, encounter notes, upcoming appointments, etc.  Non-urgent messages can be sent to your provider as well.   To learn more about what you can do with MyChart, go to [email or web address].    Your next appointment:   2-3 week(s)  The format for your next appointment:   In Person  Provider:   Shelva Majestic, MD       Signed, Shelva Majestic, MD  03/16/2020 3:04 PM    [facility] [address] [place], [place], [place]  [address] Phone: [phone]

## 2020-03-18 LAB — HM DIABETES EYE EXAM

## 2020-03-20 ENCOUNTER — Encounter (HOSPITAL_COMMUNITY): Payer: Self-pay | Admitting: Physician Assistant

## 2020-03-21 ENCOUNTER — Other Ambulatory Visit (HOSPITAL_COMMUNITY): Payer: Medicare Other

## 2020-03-21 ENCOUNTER — Telehealth (HOSPITAL_COMMUNITY): Payer: Self-pay

## 2020-03-21 NOTE — Telephone Encounter (Signed)
Encounter complete. 

## 2020-03-24 ENCOUNTER — Other Ambulatory Visit: Payer: Self-pay | Admitting: Internal Medicine

## 2020-03-24 DIAGNOSIS — I10 Essential (primary) hypertension: Secondary | ICD-10-CM

## 2020-03-24 DIAGNOSIS — J4520 Mild intermittent asthma, uncomplicated: Secondary | ICD-10-CM

## 2020-03-25 ENCOUNTER — Encounter (HOSPITAL_COMMUNITY): Admission: RE | Payer: Self-pay | Source: Ambulatory Visit

## 2020-03-25 ENCOUNTER — Ambulatory Visit (HOSPITAL_COMMUNITY): Admission: RE | Admit: 2020-03-25 | Payer: Medicare Other | Source: Ambulatory Visit | Admitting: Orthopedic Surgery

## 2020-03-25 LAB — TYPE AND SCREEN
ABO/RH(D): O POS
Antibody Screen: NEGATIVE

## 2020-03-25 SURGERY — ARTHROPLASTY, KNEE, TOTAL
Anesthesia: Choice | Site: Knee | Laterality: Right

## 2020-03-26 ENCOUNTER — Ambulatory Visit (HOSPITAL_COMMUNITY)
Admission: RE | Admit: 2020-03-26 | Discharge: 2020-03-26 | Disposition: A | Discharge status: Home / Self Care | Payer: Medicare Other | Source: Ambulatory Visit | Attending: Cardiovascular Disease | Admitting: Cardiovascular Disease

## 2020-03-26 ENCOUNTER — Other Ambulatory Visit: Payer: Self-pay

## 2020-03-26 DIAGNOSIS — Z01810 Encounter for preprocedural cardiovascular examination: Secondary | ICD-10-CM | POA: Insufficient documentation

## 2020-03-26 DIAGNOSIS — J45909 Unspecified asthma, uncomplicated: Secondary | ICD-10-CM | POA: Insufficient documentation

## 2020-03-26 DIAGNOSIS — R002 Palpitations: Secondary | ICD-10-CM | POA: Insufficient documentation

## 2020-03-26 DIAGNOSIS — E119 Type 2 diabetes mellitus without complications: Secondary | ICD-10-CM | POA: Diagnosis not present

## 2020-03-26 DIAGNOSIS — I10 Essential (primary) hypertension: Secondary | ICD-10-CM | POA: Insufficient documentation

## 2020-03-26 DIAGNOSIS — R079 Chest pain, unspecified: Secondary | ICD-10-CM | POA: Insufficient documentation

## 2020-03-26 DIAGNOSIS — R0609 Other forms of dyspnea: Secondary | ICD-10-CM | POA: Insufficient documentation

## 2020-03-26 DIAGNOSIS — Z87891 Personal history of nicotine dependence: Secondary | ICD-10-CM | POA: Diagnosis not present

## 2020-03-26 DIAGNOSIS — R42 Dizziness and giddiness: Secondary | ICD-10-CM | POA: Insufficient documentation

## 2020-03-26 DIAGNOSIS — Z01818 Encounter for other preprocedural examination: Secondary | ICD-10-CM

## 2020-03-26 LAB — MYOCARDIAL PERFUSION IMAGING
Peak HR: 100 {beats}/min
Rest HR: 68 {beats}/min
SDS: 5
SRS: 2
SSS: 7
TID: 1.1

## 2020-03-26 MED ORDER — TECHNETIUM TC 99M TETROFOSMIN IV KIT
31.1000 | PACK | Freq: Once | INTRAVENOUS | Status: AC | PRN
  Administered 2020-03-26: 31.1 via INTRAVENOUS
  Filled 2020-03-26: qty 32

## 2020-03-26 MED ORDER — REGADENOSON 0.4 MG/5ML IV SOLN
0.4000 mg | Freq: Once | INTRAVENOUS | Status: AC
Start: 2020-03-26 — End: 2020-03-26
  Administered 2020-03-26: 0.4 mg via INTRAVENOUS

## 2020-03-26 MED ORDER — AMINOPHYLLINE 25 MG/ML IV SOLN
75.0000 mg | Freq: Once | INTRAVENOUS | Status: AC
  Administered 2020-03-26: 75 mg via INTRAVENOUS

## 2020-03-26 MED ORDER — TECHNETIUM TC 99M TETROFOSMIN IV KIT
10.8000 | PACK | Freq: Once | INTRAVENOUS | Status: AC | PRN
  Administered 2020-03-26: 10.8 via INTRAVENOUS
  Filled 2020-03-26: qty 11

## 2020-03-27 DIAGNOSIS — D649 Anemia, unspecified: Secondary | ICD-10-CM | POA: Diagnosis not present

## 2020-03-27 NOTE — Addendum Note (Signed)
Addended by: Regis Bill B on: 03/27/2020 03:10 PM   Modules accepted: Orders

## 2020-03-27 NOTE — Addendum Note (Signed)
Addended by: Regis Bill B on: 03/27/2020 03:07 PM   Modules accepted: Orders

## 2020-03-27 NOTE — Addendum Note (Signed)
Addended by: Regis Bill B on: 03/27/2020 03:08 PM   Modules accepted: Orders

## 2020-04-04 ENCOUNTER — Other Ambulatory Visit: Payer: Self-pay | Admitting: Family

## 2020-04-04 DIAGNOSIS — S82851D Displaced trimalleolar fracture of right lower leg, subsequent encounter for closed fracture with routine healing: Secondary | ICD-10-CM

## 2020-04-09 ENCOUNTER — Encounter: Payer: Self-pay | Admitting: Internal Medicine

## 2020-04-10 ENCOUNTER — Ambulatory Visit: Payer: Medicare Other | Admitting: Cardiovascular Disease

## 2020-04-17 ENCOUNTER — Other Ambulatory Visit: Payer: Self-pay

## 2020-04-17 ENCOUNTER — Encounter: Payer: Self-pay | Admitting: Adult Health

## 2020-04-17 ENCOUNTER — Ambulatory Visit (INDEPENDENT_AMBULATORY_CARE_PROVIDER_SITE_OTHER): Payer: Medicare Other | Admitting: Adult Health

## 2020-04-17 VITALS — BP 128/60 | HR 79 | Ht 62.0 in | Wt 160.4 lb

## 2020-04-17 DIAGNOSIS — M25561 Pain in right knee: Secondary | ICD-10-CM

## 2020-04-17 DIAGNOSIS — I358 Other nonrheumatic aortic valve disorders: Secondary | ICD-10-CM | POA: Diagnosis not present

## 2020-04-17 DIAGNOSIS — Z01810 Encounter for preprocedural cardiovascular examination: Secondary | ICD-10-CM

## 2020-04-17 DIAGNOSIS — G8929 Other chronic pain: Secondary | ICD-10-CM

## 2020-04-17 DIAGNOSIS — I10 Essential (primary) hypertension: Secondary | ICD-10-CM

## 2020-04-17 MED ORDER — METOPROLOL SUCCINATE ER 50 MG PO TB24: 50.0000 mg | ORAL_TABLET | Freq: Every day | ORAL | 3 refills | Status: DC

## 2020-04-17 NOTE — Progress Notes (Signed)
Cardiology Office Note   Date:  04/17/2020   ID:  Kristy [name], DOB 11-Sep-1941, MRN [id number]  PCP:  Kristy [name], Kristy Patricia, MD  Cardiologist:  Dr. Tresa Endo  CC: Follow-up discussion of test results   History of Present Illness: Kristy [name] is a 79 y.o. female who presents for frequent palpitations, with an EKG showing sinus rhythm with frequent PACs.  Other history includes hypertension, and was seen last by Dr. Tresa Endo on 03/15/2020 for preoperative cardiac evaluation for right total knee replacement for March 25, 2020.  On last office visit the patient was found to be hypertensive and EKG showed atrial trigeminy.  It was recommended that she decrease HCTZ to 12.5 mg and started on metoprolol succinate 25 mg daily echocardiogram was completed on Mar 12, 2020 revealed an LV function which was diminished to 40 to 45% in the global hypokinetic pattern.  She was found to have global hypokinetic pattern with grade 1 diastolic dysfunction.  There was some mild left atrial dilatation.  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  She was recommended to defer elective knee replacement and undergo preoperative Lexiscan Myoview study.  She is advised to take HCTZ as needed only.  She was titrated up on metoprolol to 37.5 mg for 4 days and then increase to 50 mg daily.  Lexiscan Myoview on 03/26/2020 revealed no ST segment deviation, the study was found to be normal and low risk.  She comes today feeling much better.  She is not certain that she wants to proceed with her right knee surgery as the pain is gone the swelling is down, she is ambulating without instability, and is hoping to return to dancing and bowling.  Past Medical History:  Diagnosis Date  . Allergic rhinitis, seasonal   . Arthritis   . Asthma    bronchial with weather change  . 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 Dysrhythmia    "Skipping a beat"  . GERD  (gastroesophageal reflux disease)   . History of colon polyps   . Hypertension   . Pneumonia   . Rotator cuff tear, right     Past Surgical History:  Procedure Laterality Date  . BACK SURGERY     x3  ruptured disc   . CHOLECYSTECTOMY OPEN  AGE 16  . EYE SURGERY     bil cataracts  . INGUINAL HERNIA REPAIR Right AGE 16s  . KNEE ARTHROSCOPY Left 2014  . LUMBAR DISC SURGERY  x2  LAST DATE EARLY 2000s  . ORIF ANKLE FRACTURE Right 06/24/2018   Procedure: OPEN REDUCTION INTERNAL FIXATION (ORIF) RIGHT ANKLE FRACTURE;  Surgeon: Kathryne Hitch, MD;  Location: [facility];  Service: Orthopedics;  Laterality: Right;  . ROTATOR CUFF REPAIR Left 01/2015  . TONSILLECTOMY  AGE 22  . VAGINAL HYSTERECTOMY  AGE 21   WITH BSO     Current Outpatient Medications  Medication Sig Dispense Refill  . albuterol (VENTOLIN HFA) 108 (90 Base) MCG/ACT inhaler INHALE 2 PUFFS BY MOUTH EVERY 6 HOURS AS NEEDED FOR WHEEZE 6.7 g 1  . amLODipine (NORVASC) 10 MG tablet TAKE 1 TABLET BY MOUTH EVERY DAY 90 tablet 0  . benazepril (LOTENSIN) 40 MG tablet TAKE 1 TABLET BY MOUTH EVERY DAY 90 tablet 1  . celecoxib (CELEBREX) 200 MG capsule TAKE 1 CAPSULE BY MOUTH EVERY DAY 90 capsule 1  . ferrous sulfate 325 (65 FE) MG tablet Take  325 mg by mouth daily with breakfast.    . fluticasone (FLONASE) 50 MCG/ACT nasal spray SPRAY 2 SPRAYS INTO EACH NOSTRIL EVERY DAY 48 mL 0  . hydrochlorothiazide (HYDRODIURIL) 12.5 MG tablet Take 1 tablet (12.5 mg total) by mouth as needed. FOR SWELLING 90 tablet 3  . metFORMIN (GLUCOPHAGE) 500 MG tablet Take 1 tablet (500 mg total) by mouth 2 (two) times daily with a meal. 180 tablet 1  . metoprolol succinate (TOPROL XL) 50 MG 24 hr tablet Take 1 tablet (50 mg total) by mouth daily. 90 tablet 3  . traMADol (ULTRAM) 50 MG tablet TAKE 1 TABLET (50 MG TOTAL) BY MOUTH EVERY 12 (TWELVE) HOURS AS NEEDED. 60 tablet 0  . WIXELA INHUB 250-50 MCG/DOSE AEPB INHALE 1 PUFF BY MOUTH EVERY 12 HOURS 180 each 1     No current facility-administered medications for this visit.    Allergies:   Clonidine    Social History:  The patient  reports that she quit smoking about 31 years ago. She quit after 10.00 years of use. She has never used smokeless tobacco. She reports current alcohol use of about 3.0 - 4.0 standard drinks of alcohol per week. She reports that she does not use drugs.   Family History:  The patient's family history is not on file.    ROS: All other systems are reviewed and negative. Unless otherwise mentioned in H&P    PHYSICAL EXAM: VS:  BP 128/60   Pulse 79   Ht 5\' 2"  (1.575 m)   Wt 160 lb 6.4 oz (72.8 kg)   SpO2 96%   BMI 29.34 kg/m  , BMI Body mass index is 29.34 kg/m. GEN: Well nourished, well developed, in no acute distress HEENT: normal Neck: no JVD, carotid bruits, or masses Cardiac: RRR; soft 1/6 systolic  murmurs, rubs, or gallops,no edema  Respiratory:  Clear to auscultation bilaterally, normal work of breathing GI: soft, nontender, nondistended, + BS MS: no deformity or atrophy Skin: warm and dry, no rash Neuro:  Strength and sensation are intact Psych: euthymic mood, full affect   EKG: Normal sinus rhythm with nonspecific lateral T wave flattening.  Heart rate of 77 bpm.  (Personally reviewed)   Recent Labs: 03/06/2020: Magnesium 2.0; TSH 2.37 03/15/2020: ALT 16; BUN 19; Creatinine, Ser 0.81; Hemoglobin 11.6; Platelets 348; Potassium 3.9; Sodium 141    Lipid Panel    Component Value Date/Time   CHOL 197 03/14/2019 0810   TRIG 98.0 03/14/2019 0810   TRIG 82 09/30/2006 0943   HDL 60.90 03/14/2019 0810   CHOLHDL 3 03/14/2019 0810   VLDL 19.6 03/14/2019 0810   LDLCALC 116 (H) 03/14/2019 0810   LDLDIRECT 118.8 03/30/2013 1145      Wt Readings from Last 3 Encounters:  04/17/20 160 lb 6.4 oz (72.8 kg)  03/26/20 165 lb (74.8 kg)  03/15/20 163 lb 9 oz (74.2 kg)      Other studies Reviewed: 03/17/20 03/26/2020 Study Highlights    There was no  ST segment deviation noted during stress.  The study is normal.  This is a low risk study.   Normal pharmacologic nuclear stress test with no prior infarct and no ischemia.   Echocardiogram 03/12/2020 1. Mild to moderate LV dysfunction; grade 1 diastolic dysfunction; mild  LVE; mild AI and MR; mild LAE.  2. Left ventricular ejection fraction, by estimation, is 40 to 45%. The  left ventricle has mildly decreased function. The left ventricle  demonstrates global hypokinesis. The left  ventricular internal cavity size  was mildly dilated. Left ventricular  diastolic parameters are consistent with Grade I diastolic dysfunction  (impaired relaxation).  3. Right ventricular systolic function is normal. The right ventricular  size is normal. There is mildly elevated pulmonary artery systolic  pressure.  4. Left atrial size was mildly dilated.  5. The mitral valve is normal in structure. Mild mitral valve  regurgitation. No evidence of mitral stenosis.  6. The aortic valve is tricuspid. Aortic valve regurgitation is mild. No  aortic stenosis is present.  7. The inferior vena cava is normal in size with greater than 50%  respiratory variability, suggesting right atrial pressure of 3 mmHg.   ASSESSMENT AND PLAN:  1. Per-Operative Evaluation:  Chart reviewed as part of pre-operative protocol coverage. Given past medical history and time since last visit, based on ACC/AHA guidelines, ANNSLEIGH DRAGOO would be at acceptable risk for the planned procedure without further cardiovascular testing.   2.  Hypertension: Currently well controlled at 128/60, she is given refills on metoprolol succinate 50 mg daily.  The patient will continue on amlodipine 10 mg daily, lower dose of HCTZ and benazepril 40 mg daily.  She is feeling well, the edema in her knees and legs is eliminated..  3.  Non-insulin-dependent diabetes: Followed by PCP.  4.  Right knee pain: She wishes to defer surgery on her right  knee as she is feeling much better without any edema, pain, and increased range of motion.  She is discuss this further with Dr. Despina Hick orthopedic surgeon.  Current medicines are reviewed at length with the patient today.  I have spent 25 minutes  dedicated to the care of this patient on the date of this encounter to include pre-visit review of records, assessment, management and diagnostic testing,with shared decision making.  Labs/ tests ordered today include: None  Bettey Mare. Liborio Nixon, ANP, AACC   04/17/2020 4:29 PM    [facility] [address] Office [phone] Fax [phone]  Notice: This dictation was prepared with Dragon dictation along with smaller phrase technology. Any transcriptional errors that result from this process are unintentional and may not be corrected upon review.

## 2020-04-17 NOTE — Patient Instructions (Signed)
Medication Instructions:  Continue current medications   *If you need a refill on your cardiac medications before your next appointment, please call your pharmacy*   Lab Work: None Ordered   Testing/Procedures: None ordered   Follow-Up: At [facility], you and your health needs are our priority.  As part of our continuing mission to provide you with exceptional heart care, we have created designated Provider Care Teams.  These Care Teams include your primary Cardiologist (physician) and Advanced Practice Providers (APPs -  Physician Assistants and Nurse Practitioners) who all work together to provide you with the care you need, when you need it.  We recommend signing up for the patient portal called "MyChart".  Sign up information is provided on this After Visit Summary.  MyChart is used to connect with patients for Virtual Visits (Telemedicine).  Patients are able to view lab/test results, encounter notes, upcoming appointments, etc.  Non-urgent messages can be sent to your provider as well.   To learn more about what you can do with MyChart, go to [email or web address].    Your next appointment:   6 month(s)  The format for your next appointment:   In Person  Provider:   You may see Deklan Minar Kelly, MD  or one of the following Advanced Practice Providers on your designated Care Team:    Hao Meng, PA-C  Angela Duke, PA-C or   Krista Kroeger, PA-C     

## 2020-04-25 ENCOUNTER — Other Ambulatory Visit: Payer: Self-pay | Admitting: *Deleted

## 2020-04-25 MED ORDER — CELECOXIB 200 MG PO CAPS: ORAL_CAPSULE | ORAL | 1 refills | Status: DC

## 2020-04-26 NOTE — Addendum Note (Signed)
Addended by: Orlene Och on: 04/26/2020 04:07 PM   Modules accepted: Orders

## 2020-04-29 ENCOUNTER — Other Ambulatory Visit: Payer: Self-pay | Admitting: Internal Medicine

## 2020-05-02 ENCOUNTER — Encounter: Payer: Self-pay | Admitting: Dietician

## 2020-05-02 ENCOUNTER — Other Ambulatory Visit: Payer: Self-pay

## 2020-05-02 ENCOUNTER — Encounter: Payer: Medicare Other | Attending: Internal Medicine | Admitting: Dietician

## 2020-05-02 DIAGNOSIS — E119 Type 2 diabetes mellitus without complications: Secondary | ICD-10-CM | POA: Insufficient documentation

## 2020-05-02 NOTE — Progress Notes (Signed)
Diabetes Self-Management Education  Visit Type: First/Initial  Appt. Start Time: 10:15am    Appt. End Time: 11:15am  05/02/2020  Kristy [name], identified by name and date of birth, is a 79 y.o. female with a diagnosis of Diabetes: Type 2.   ASSESSMENT  Patient states she has a family hx of diabetes. States she was surprised to find out she has diabetes. Currently having knee problems which limits her, however otherwise lives an active lifestyle including dancing 2 days/week, bowling 2 days/week, and lots of walking. States that she lost her husband within recent months, so this along with her knee problems have caused much stress. Typical meal pattern is 3 meals per day. Does very well with eating balanced meals and lots of vegetables.    Diabetes Self-Management Education - 05/02/20 1047      Visit Information   Visit Type First/Initial      Initial Visit   Diabetes Type Type 2    Are you currently following a meal plan? No    Are you taking your medications as prescribed? Yes    Date Diagnosed 03/11/2020   pt states she is confused about whether or not she has diabetes because she was never told or given any direction for what to do     Health Coping   How would you rate your overall health? Good      Psychosocial Assessment   Patient Belief/Attitude about Diabetes Motivated to manage diabetes    Self-care barriers None    Self-management support Doctor's office    Patient Concerns Nutrition/Meal planning;Glycemic Control;Weight Control    Special Needs None    Preferred Learning Style No preference indicated    Learning Readiness Ready    How often do you need to have someone help you when you read instructions, pamphlets, or other written materials from your doctor or pharmacy? 1 - Never    What is the last grade level you completed in school? associates degree      Complications   Last HgB A1C per patient/outside source 6.6 %   03/01/2020   How often do you check your  blood sugar? Not recommended by provider    Have you had a dilated eye exam in the past 12 months? Yes    Have you had a dental exam in the past 12 months? Yes    Are you checking your feet? N/A      Dietary Intake   Breakfast 2 strips bacon + 1 egg + fruit + 1 slice toast + avocado    Lunch salad   or [place] sandwich with lettuce/tomato on whole wheat   Dinner chicken wings + corn + squash + salad    Beverage(s) water, ginger ale, wine, occasionally tea/coffee (always decaf)      Exercise   Exercise Type ADL's   pt states she is usually very active with dancing, bowling, and walking however she is currently limited d/t her knee     Patient Education   Previous Diabetes Education No    Disease state  Definition of diabetes, type 1 and 2, and the diagnosis of diabetes;Factors that contribute to the development of diabetes;Explored patient's options for treatment of their diabetes    Nutrition management  Role of diet in the treatment of diabetes and the relationship between the three main macronutrients and blood glucose level;Food label reading, portion sizes and measuring food.;Effects of alcohol on blood glucose and safety factors with consumption of alcohol.;Information on hints  to eating out and maintain blood glucose control.;Meal options for control of blood glucose level and chronic complications.    Physical activity and exercise  Role of exercise on diabetes management, blood pressure control and cardiac health.;Helped patient identify appropriate exercises in relation to his/her diabetes, diabetes complications and other health issue.    Monitoring Identified appropriate SMBG and/or A1C goals.;Daily foot exams;Yearly dilated eye exam    Acute complications Taught treatment of hypoglycemia - the 15 rule.;Discussed and identified patients' treatment of hyperglycemia.    Chronic complications Relationship between chronic complications and blood glucose control;Assessed and discussed foot  care and prevention of foot problems;Applicable immunizations;Reviewed with patient heart disease, higher risk of, and prevention;Retinopathy and reason for yearly dilated eye exams    Psychosocial adjustment Role of stress on diabetes;Identified and addressed patients feelings and concerns about diabetes      Individualized Goals (developed by patient)   Nutrition General guidelines for healthy choices and portions discussed    Medications take my medication as prescribed    Monitoring  Not Applicable      Outcomes   Expected Outcomes Demonstrated interest in learning. Expect positive outcomes    Future DMSE 2 months           Individualized Plan for Diabetes Self-Management Training:  Learning Objective:  Patient will have a greater understanding of diabetes self-management. Patient education plan is to attend individual and/or group sessions per assessed needs and concerns.   Plan:  Patient Instructions  It was nice meeting you today!  Remember the main goals as you eat throughout your day:   Keep desserts/ sweet treats to 3-4 days per week and stick to 1 serving at a time  If having wine, stick to 1 glass/serving at a time and have a snack/meal with it  Try to have breakfast within 2 hours of waking up  ? Include a source of carbohydrates and a source of protein   Eat at least 3 times per day  ? For meals, aim for non-starchy vegetables, carbohydrates, and protein  ? For snacks, aim for carbohydrates and protein for balance   Expected Outcomes:  Demonstrated interest in learning. Expect positive outcomes  Education material provided: ADA - How to Thrive: A Guide for Your Journey with Diabetes, My Plate and Snack sheet  If problems or questions, patient to contact team via: Phone and Email  Future DSME appointment: 2 months

## 2020-05-02 NOTE — Patient Instructions (Addendum)
It was nice meeting you today!  Remember the main goals as you eat throughout your day:   Keep desserts/ sweet treats to 3-4 days per week and stick to 1 serving at a time  If having wine, stick to 1 glass/serving at a time and have a snack/meal with it  Try to have breakfast within 2 hours of waking up  ? Include a source of carbohydrates and a source of protein   Eat at least 3 times per day  ? For meals, aim for non-starchy vegetables, carbohydrates, and protein  ? For snacks, aim for carbohydrates and protein for balance

## 2020-05-15 ENCOUNTER — Ambulatory Visit: Payer: Medicare Other | Admitting: Cardiovascular Disease

## 2020-06-08 ENCOUNTER — Other Ambulatory Visit: Payer: Self-pay | Admitting: Family

## 2020-06-11 NOTE — Telephone Encounter (Signed)
Pt is out of this medication and needs a refill   Medication:  Benazepril   Pharmacy:  [id number] - [place], [place] - [address] Phone:  [phone]  Fax:  [phone]

## 2020-06-27 ENCOUNTER — Encounter: Payer: Medicare Other | Attending: Internal Medicine | Admitting: Dietician

## 2020-06-27 DIAGNOSIS — E119 Type 2 diabetes mellitus without complications: Secondary | ICD-10-CM | POA: Insufficient documentation

## 2020-06-29 ENCOUNTER — Other Ambulatory Visit: Payer: Self-pay | Admitting: Internal Medicine

## 2020-06-29 DIAGNOSIS — I10 Essential (primary) hypertension: Secondary | ICD-10-CM

## 2020-07-01 ENCOUNTER — Other Ambulatory Visit: Payer: Self-pay

## 2020-07-01 ENCOUNTER — Encounter: Payer: Self-pay | Admitting: Dietician

## 2020-07-01 ENCOUNTER — Encounter: Payer: Medicare Other | Admitting: Dietician

## 2020-07-01 DIAGNOSIS — E119 Type 2 diabetes mellitus without complications: Secondary | ICD-10-CM

## 2020-07-01 NOTE — Progress Notes (Signed)
Diabetes Self-Management Education  Visit Type: Follow-up  07/01/2020  [name], identified by name and date of birth, is a 79 y.o. female with a diagnosis of Diabetes: Type 2.   ASSESSMENT  Patient states she has done well with balancing her meals and food intake throughout the day and cutting back on sweets/desserts. States that, since she has prioritized eating breakfast, her energy level has gotten better throughout the day.    Diabetes Self-Management Education - 07/01/20 1450      Visit Information   Visit Type Follow-up      Initial Visit   Diabetes Type Type 2      Complications   How often do you check your blood sugar? 0 times/day (not testing)      Dietary Intake   Breakfast yogurt   or egg + bacon; or bagel with cream cheese   Lunch salad w/ chicken/turkey    Dinner vegetables    Beverage(s) water, or decaf tea, lemonade      Exercise   Exercise Type ADL's;Light (walking / raking leaves)      Patient Education   Nutrition management  Meal options for control of blood glucose level and chronic complications.    Physical activity and exercise  Role of exercise on diabetes management, blood pressure control and cardiac health.;Helped patient identify appropriate exercises in relation to his/her diabetes, diabetes complications and other health issue.      Individualized Goals (developed by patient)   Nutrition General guidelines for healthy choices and portions discussed      Outcomes   Future DMSE PRN      Subsequent Visit   Since your last visit have you continued or begun to take your medications as prescribed? Yes    Since your last visit have you experienced any weight changes? No change    Since your last visit, are you checking your blood glucose at least once a day? N/A           Individualized Plan for Diabetes Self-Management Training:  Learning Objective:  Patient will have a greater understanding of diabetes self-management. Patient  education plan is to attend individual and/or group sessions per assessed needs and concerns.  Education material provided: Breakfast Ideas  If problems or questions, patient to contact team via: Phone and Email  Future DSME appointment: PRN

## 2020-07-18 DIAGNOSIS — Z23 Encounter for immunization: Secondary | ICD-10-CM | POA: Diagnosis not present

## 2020-08-01 ENCOUNTER — Other Ambulatory Visit: Payer: Self-pay | Admitting: Internal Medicine

## 2020-08-01 ENCOUNTER — Other Ambulatory Visit: Payer: Self-pay | Admitting: Adult Health

## 2020-08-01 DIAGNOSIS — E119 Type 2 diabetes mellitus without complications: Secondary | ICD-10-CM

## 2020-08-01 DIAGNOSIS — S82851D Displaced trimalleolar fracture of right lower leg, subsequent encounter for closed fracture with routine healing: Secondary | ICD-10-CM

## 2020-08-02 NOTE — Telephone Encounter (Signed)
Routing back to you. I filled it for her while  you were on vacation

## 2020-08-05 ENCOUNTER — Other Ambulatory Visit: Payer: Self-pay | Admitting: Adult Health

## 2020-08-05 DIAGNOSIS — S82851D Displaced trimalleolar fracture of right lower leg, subsequent encounter for closed fracture with routine healing: Secondary | ICD-10-CM

## 2020-08-06 NOTE — Telephone Encounter (Signed)
Message sent to PCP as I am unable to send denial to pharmacy due to controlled Rx.

## 2020-08-20 ENCOUNTER — Other Ambulatory Visit: Payer: Self-pay | Admitting: Internal Medicine

## 2020-08-20 DIAGNOSIS — E119 Type 2 diabetes mellitus without complications: Secondary | ICD-10-CM

## 2020-08-22 DIAGNOSIS — Z23 Encounter for immunization: Secondary | ICD-10-CM | POA: Diagnosis not present

## 2020-08-27 DIAGNOSIS — R7303 Prediabetes: Secondary | ICD-10-CM | POA: Diagnosis not present

## 2020-08-27 DIAGNOSIS — H524 Presbyopia: Secondary | ICD-10-CM | POA: Diagnosis not present

## 2020-08-27 DIAGNOSIS — H18513 Endothelial corneal dystrophy, bilateral: Secondary | ICD-10-CM | POA: Diagnosis not present

## 2020-08-27 DIAGNOSIS — Z961 Presence of intraocular lens: Secondary | ICD-10-CM | POA: Diagnosis not present

## 2020-08-28 DIAGNOSIS — M154 Erosive (osteo)arthritis: Secondary | ICD-10-CM | POA: Diagnosis not present

## 2020-08-28 DIAGNOSIS — Z6828 Body mass index (BMI) 28.0-28.9, adult: Secondary | ICD-10-CM | POA: Diagnosis not present

## 2020-08-28 DIAGNOSIS — M150 Primary generalized (osteo)arthritis: Secondary | ICD-10-CM | POA: Diagnosis not present

## 2020-08-28 DIAGNOSIS — E663 Overweight: Secondary | ICD-10-CM | POA: Diagnosis not present

## 2020-08-28 DIAGNOSIS — Z79899 Other long term (current) drug therapy: Secondary | ICD-10-CM | POA: Diagnosis not present

## 2020-08-28 DIAGNOSIS — M2550 Pain in unspecified joint: Secondary | ICD-10-CM | POA: Diagnosis not present

## 2020-10-03 ENCOUNTER — Encounter: Payer: Self-pay | Admitting: Cardiovascular Disease

## 2020-10-03 ENCOUNTER — Other Ambulatory Visit: Payer: Self-pay

## 2020-10-03 ENCOUNTER — Ambulatory Visit (INDEPENDENT_AMBULATORY_CARE_PROVIDER_SITE_OTHER): Payer: Medicare Other | Admitting: Cardiovascular Disease

## 2020-10-03 VITALS — BP 148/76 | HR 69 | Ht 61.5 in | Wt 156.8 lb

## 2020-10-03 DIAGNOSIS — I340 Nonrheumatic mitral (valve) insufficiency: Secondary | ICD-10-CM

## 2020-10-03 DIAGNOSIS — M25561 Pain in right knee: Secondary | ICD-10-CM

## 2020-10-03 DIAGNOSIS — Z01810 Encounter for preprocedural cardiovascular examination: Secondary | ICD-10-CM | POA: Diagnosis not present

## 2020-10-03 DIAGNOSIS — I429 Cardiomyopathy, unspecified: Secondary | ICD-10-CM

## 2020-10-03 DIAGNOSIS — G8929 Other chronic pain: Secondary | ICD-10-CM

## 2020-10-03 DIAGNOSIS — I10 Essential (primary) hypertension: Secondary | ICD-10-CM

## 2020-10-03 DIAGNOSIS — I351 Nonrheumatic aortic (valve) insufficiency: Secondary | ICD-10-CM | POA: Diagnosis not present

## 2020-10-03 NOTE — Progress Notes (Signed)
Cardiology Office Note    Date:  10/05/2020   ID:  Kristy [name], DOB [birth year], MRN [id number]  PCP:  Isaac Bliss, Rayford Halsted, MD  Cardiologist:  Shelva Majestic, MD   6 month F/U cardiology evaluation initially  referred through the courtesy of Dr. Isaac Bliss for preoperative evaluation prior to undergoing elective knee replacement surgery.  History of Present Illness:  Kristy [name] is a 79 y.o. female who is followed by Dr. Isaac Bliss for primary care.  She had previously undergone right knee arthroscopy and apparently is scheduled to undergo right total knee replacement electively on June 7.  She has had significant difficulty with walking and has been using a cane.  Previously in the past she had enjoyed Zumba as well as bowling.  She was seen yesterday by Dr. Isaac Bliss for preoperative clearance.  During her evaluation she was noticed to have frequent skipping beats and palpitations.  An ECG showed sinus rhythm with frequent PACs with a ventricular rate around 85 without acute ST-T changes.  She has a history of essential hypertension.  She was worked into my schedule today to be seen for preoperative cardiology clearance prior to her elective surgery.  A 2D echo Doppler study had been scheduled.  I saw the patient for initial cardiology evaluation on Mar 07, 2020.  At that time, she denied any chest pain or chest tightness and denied any significant shortness of breath.  She has a history of hypertension for greater than 15 years and was on a regimen consisting of amlodipine 10 mg, benazepril 40 mg and was on hydrochlorothiazide 25 mg daily.   Laboratory drawn the day before her evaluation with me had shown a hemoglobin A1c at 6.6 and Dr. Isaac Bliss had called in a prescription for metformin to initiate 500 mg twice a day effective today.  In addition she was found to be hypokalemic with a potassium of 3.1 and supplemental potassium 40 mEq daily for 3 days was  ordered.  She was unaware of any  heart rate irregularity.  I reviewed the EKG from Dr. Cresenciano Lick evaluation which showed every fourth beat being a PAC.  Ventricular rate was 85 bpm.  She denies PND orthopnea.  She denies presyncope or syncope.  She is unaware of any significant cardiac murmur.  During my evaluation, her blood pressure was slightly elevated at 144/70 and her ECG showed atrial trigeminy.  At that time I recommended she decrease HCTZ to 12.5 mg and started her on metoprolol succinate 25 mg daily with plan titration to 50 mg.  She underwent an echo Doppler study on Mar 12, 2020.  This revealed reduced LV function with an EF of 40 to 45% in a global hypokinetic pattern.  Left-ventricular internal cavity size is mildly dilated.  There is grade 1 diastolic dysfunction.  She had mildly elevated pulmonic artery systolic pressure.  There was mild left atrial dilatation.  There is evidence for a tricuspid aortic valve with mild aortic insufficiency.  There is mild mitral regurgitation.  She underwent follow-up laboratory which revealed improvement in her hypokalemia with potassium at 3.9.  With her reduced LV function, a Lexiscan Myoview study was recommended which was done on March 26, 2020 and was low risk.  She was seen on April 17, 2020 by Jory Sims, DNP and at that time, her blood pressure was improved.  At that time, her knee discomfort was improved and she wished to defer surgery.  Over the  past 6 months, she has been on amlodipine 10 mg, benazepril 40 mg, HCTZ 12.5 mg in addition to metoprolol succinate 50 mg daily.  She has experienced ankle swelling.  She has had some recurrent knee discomfort and is now planning to undergo knee surgery with Dr. Sandie Ano.  She presents for follow-up evaluation and preoperative clearance.  Past Medical History:  Diagnosis Date  . Allergic rhinitis, seasonal   . Arthritis   . Asthma    bronchial with weather change  . Diabetes mellitus  without complication ([place])    type 2   . Dysrhythmia    "Skipping a beat"  . GERD (gastroesophageal reflux disease)   . History of colon polyps   . Hypertension   . Pneumonia   . Rotator cuff tear, right     Past Surgical History:  Procedure Laterality Date  . BACK SURGERY     x3  ruptured disc   . CHOLECYSTECTOMY OPEN  AGE 68  . EYE SURGERY     bil cataracts  . INGUINAL HERNIA REPAIR Right AGE 33s  . KNEE ARTHROSCOPY Left 2014  . LUMBAR [place] SURGERY  x2  LAST DATE EARLY 2000s  . ORIF ANKLE FRACTURE Right 06/24/2018   Procedure: OPEN REDUCTION INTERNAL FIXATION (ORIF) RIGHT ANKLE FRACTURE;  Surgeon: Mcarthur Rossetti, MD;  Location: [facility];  Service: Orthopedics;  Laterality: Right;  . ROTATOR CUFF REPAIR Left 01/2015  . TONSILLECTOMY  AGE 26  . VAGINAL HYSTERECTOMY  AGE 20   WITH BSO    Current Medications: Outpatient Medications Prior to Visit  Medication Sig Dispense Refill  . albuterol (VENTOLIN HFA) 108 (90 Base) MCG/ACT inhaler INHALE 2 PUFFS BY MOUTH EVERY 6 HOURS AS NEEDED FOR WHEEZE 6.7 g 1  . amLODipine (NORVASC) 10 MG tablet TAKE 1 TABLET BY MOUTH EVERY DAY 90 tablet 1  . benazepril (LOTENSIN) 40 MG tablet TAKE 1 TABLET BY MOUTH EVERY DAY 90 tablet 1  . celecoxib (CELEBREX) 200 MG capsule TAKE 1 CAPSULE BY MOUTH EVERY DAY 90 capsule 1  . ferrous sulfate 325 (65 FE) MG tablet Take 325 mg by mouth daily with breakfast.    . fluticasone (FLONASE) 50 MCG/ACT nasal spray SPRAY 2 SPRAYS INTO EACH NOSTRIL EVERY DAY 48 mL 0  . hydrochlorothiazide (MICROZIDE) 12.5 MG capsule Take 12.5 mg by mouth daily.    . metFORMIN (GLUCOPHAGE) 500 MG tablet TAKE 1 TABLET (500 MG TOTAL) BY MOUTH 2 (TWO) TIMES DAILY WITH A MEAL. 180 tablet 1  . metoprolol succinate (TOPROL XL) 50 MG 24 hr tablet Take 1 tablet (50 mg total) by mouth daily. 90 tablet 3  . traMADol (ULTRAM) 50 MG tablet TAKE 1 TABLET (50 MG TOTAL) BY MOUTH EVERY 12 (TWELVE) HOURS AS NEEDED. 60 tablet 0  . WIXELA INHUB  250-50 MCG/DOSE AEPB INHALE 1 PUFF BY MOUTH EVERY 12 HOURS 180 each 1  . hydrochlorothiazide (HYDRODIURIL) 12.5 MG tablet Take 1 tablet (12.5 mg total) by mouth as needed. FOR SWELLING (Patient taking differently: Take 12.5 mg by mouth daily. FOR SWELLING) 90 tablet 3  . hydrochlorothiazide (HYDRODIURIL) 25 MG tablet TAKE 1 TABLET BY MOUTH EVERY DAY 90 tablet 1   No facility-administered medications prior to visit.     Allergies:   Clonidine   Social History   Socioeconomic History  . Marital status: Widowed    Spouse name: Not on file  . Number of children: Not on file  . Years of education: Not on file  .  Highest education level: Not on file  Occupational History  . Not on file  Tobacco Use  . Smoking status: Former Smoker    Years: 10.00    Quit date: 10/19/1988    Years since quitting: 31.9  . Smokeless tobacco: Never Used  . Tobacco comment: 1 a day  Vaping Use  . Vaping Use: Never used  Substance and Sexual Activity  . Alcohol use: Yes    Alcohol/week: 3.0 - 4.0 standard drinks    Types: 3 - 4 Glasses of wine per week    Comment: 2-3 times a week  . Drug use: Never  . Sexual activity: Not Currently  Other Topics Concern  . Not on file  Social History Narrative  . Not on file   Social Determinants of Health   Financial Resource Strain: Not on file  Food Insecurity: Not on file  Transportation Needs: Not on file  Physical Activity: Not on file  Stress: Not on file  Social Connections: Not on file    Additional social history: She is widowed for 1 year.  She has 4 children, 12 grandchildren and 10 great-grandchildren.  She is retired.  Previously she was a Librarian, academic for [employer].  She has associated college degree.  Remotely she had smoked for 5 years.  She quit in 2001.  She does drink occasional wine.  Used to be very active but this has been limited due to her progressive knee pain  Family History: Family history is notable that her mother died at age 29 with a  stroke.  Father died at age 15.  Maternal grandmother died at 1 with natural causes.  She has 4 brothers and 3 sisters.  ROS General: Negative; No fevers, chills, or night sweats;  HEENT: Negative; No changes in vision or hearing, sinus congestion, difficulty swallowing Pulmonary: History of allergy induced bronchial asthma Cardiovascular: See HPI GI: Negative; No nausea, vomiting, diarrhea, or abdominal pain GU: Negative; No dysuria, hematuria, or difficulty voiding Musculoskeletal: Positive for right knee discomfort Hematologic/Oncology: Negative; no easy bruising, bleeding Endocrine: Positive for diabetes Neuro: Negative; no changes in balance, headaches Skin: Negative; No rashes or skin lesions Psychiatric: Negative; No behavioral problems, depression Sleep: Negative; No snoring, daytime sleepiness, hypersomnolence, bruxism, restless legs, hypnogognic hallucinations, no cataplexy Other comprehensive 14 point system review is negative.   PHYSICAL EXAM:   VS:  BP (!) 148/76   Pulse 69   Ht 5' 1.5" (1.562 m)   Wt 156 lb 12.8 oz (71.1 kg)   SpO2 98%   BMI 29.15 kg/m     Repeat blood pressure by me was 130/76  Wt Readings from Last 3 Encounters:  10/03/20 156 lb 12.8 oz (71.1 kg)  04/17/20 160 lb 6.4 oz (72.8 kg)  03/26/20 165 lb (74.8 kg)    General: Alert, oriented, no distress.  Skin: normal turgor, no rashes, warm and dry HEENT: Normocephalic, atraumatic. Pupils equal round and reactive to light; sclera anicteric; extraocular muscles intact;  Nose without nasal septal hypertrophy Mouth/Parynx benign; Mallinpatti scale 3 Neck: No JVD, no carotid bruits; normal carotid upstroke Lungs: clear to ausculatation and percussion; no wheezing or rales Chest wall: without tenderness to palpitation Heart: PMI not displaced, RRR, s1 s2 normal, 1/6 systolic murmur, no diastolic murmur, no rubs, gallops, thrills, or heaves Abdomen: soft, nontender; no hepatosplenomehaly, BS+;  abdominal aorta nontender and not dilated by palpation. Back: no CVA tenderness Pulses 2+ Musculoskeletal: full range of motion, normal strength, no joint deformities Extremities:  Trivial ankle swelling bilaterally; no clubbing cyanosis, Homan's sign negative  Neurologic: grossly nonfocal; Cranial nerves grossly wnl Psychologic: Normal mood and affect   Studies/Labs Reviewed:   EKG:  EKG is ordered today.  NSR at 69; baseline wander; normal intervals  Mat 2021 ECG (independently read by me): Sinus rhythm at 78 bpm with PACs and atrial trigeminal rhythm.  QTc interval '3 7 3 ' ms.  Parable 146 ms  Mar 07, 2020 ECG (independently read by me): Sinus rhythm with PACs and atrial trigeminal rhythm pattern.  QTc interval 446 ms  Recent Labs: BMP Latest Ref Rng & Units 03/15/2020 03/13/2020 03/06/2020  Glucose 70 - 99 mg/dL 92 105(H) 121(H)  BUN 8 - 23 mg/dL '19 17 20  ' Creatinine 0.44 - 1.00 mg/dL 0.81 0.90 0.76  BUN/Creat Ratio 12 - 28 - 19 -  Sodium 135 - 145 mmol/L 141 139 139  Potassium 3.5 - 5.1 mmol/L 3.9 3.9 3.1(L)  Chloride 98 - 111 mmol/L 103 102 100  CO2 22 - 32 mmol/L '26 21 29  ' Calcium 8.9 - 10.3 mg/dL 9.4 9.7 10.1     Hepatic Function Latest Ref Rng & Units 03/15/2020 03/06/2020 04/19/2019  Total Protein 6.5 - 8.1 g/dL 7.6 8.0 7.5  Albumin 3.5 - 5.0 g/dL 4.2 4.6 4.1  AST 15 - 41 U/L '19 15 16  ' ALT 0 - 44 U/L '16 12 14  ' Alk Phosphatase 38 - 126 U/L 54 61 49  Total Bilirubin 0.3 - 1.2 mg/dL 0.5 0.3 0.3  Bilirubin, Direct 0.0 - 0.3 mg/dL - - -    CBC Latest Ref Rng & Units 03/15/2020 03/06/2020 04/19/2019  WBC 4.0 - 10.5 K/uL 7.3 7.9 9.3  Hemoglobin 12.0 - 15.0 g/dL 11.6(L) 11.7(L) 12.9  Hematocrit 36.0 - 46.0 % 37.4 35.5(L) 39.8  Platelets 150 - 400 K/uL 348 385.0 321   Lab Results  Component Value Date   MCV 85.6 03/15/2020   MCV 81.0 03/06/2020   MCV 93.4 04/19/2019   Lab Results  Component Value Date   TSH 2.37 03/06/2020   Lab Results  Component Value Date   HGBA1C 6.6  (H) 03/06/2020     BNP No results found for: BNP  ProBNP No results found for: PROBNP   Lipid Panel     Component Value Date/Time   CHOL 197 03/14/2019 0810   TRIG 98.0 03/14/2019 0810   TRIG 82 09/30/2006 0943   HDL 60.90 03/14/2019 0810   CHOLHDL 3 03/14/2019 0810   VLDL 19.6 03/14/2019 0810   LDLCALC 116 (H) 03/14/2019 0810   LDLDIRECT 118.8 03/30/2013 1145     RADIOLOGY: No results found.   Additional studies/ records that were reviewed today include:  I reviewed the patient's records from Dr. Isaac Bliss.  ECHO 03/12/2020 IMPRESSIONS  1. Mild to moderate LV dysfunction; grade 1 diastolic dysfunction; mild  LVE; mild AI and MR; mild LAE.  2. Left ventricular ejection fraction, by estimation, is 40 to 45%. The  left ventricle has mildly decreased function. The left ventricle  demonstrates global hypokinesis. The left ventricular internal cavity size  was mildly dilated. Left ventricular  diastolic parameters are consistent with Grade I diastolic dysfunction  (impaired relaxation).  3. Right ventricular systolic function is normal. The right ventricular  size is normal. There is mildly elevated pulmonary artery systolic  pressure.  4. Left atrial size was mildly dilated.  5. The mitral valve is normal in structure. Mild mitral valve  regurgitation. No evidence of mitral  stenosis.  6. The aortic valve is tricuspid. Aortic valve regurgitation is mild. No  aortic stenosis is present.  7. The inferior vena cava is normal in size with greater than 50%  respiratory variability, suggesting right atrial pressure of 3 mmHg.    ASSESSMENT:    1. Pre-operative cardiovascular examination   2. Essential hypertension   3. Cardiomyopathy, unspecified type ([place]): EF 40 - 45%, grade I DD   4. Chronic pain of right knee   5. Mild mitral regurgitation   6. Mild aortic insufficiency     PLAN:  Kristy [name] is a very pleasant 79 year old African-American  female who  has a history of essential hypertension for greater than 15 years and most recently has been on amlodipine 10 mg daily, benazepril 40 mg and HCTZ 25 mg.  I saw her for initial evaluation in May 2021 after she had been seen by Dr. Isaac Bliss who noted irregularity to her heart rate.  The patient was planning to undergo knee surgery.   When I saw her for initial preoperative evaluation on Mar 07, 2020, her ECG showed sinus rhythm with atrial trigeminy.  Potassium drawn the day prior to her initial evaluation was low at 3.1 and hemoglobin A1c was increased and she was started on metformin and given potassium replacement.  During that initial evaluation I recommended initiation of metoprolol succinate 25 mg daily and reduction of her HCTZ to 12.5 mg daily.  Subsequent lab work showed normalization of potassium.  An echo Doppler study subsequent laboratory has shown normalization of potassium.  An echo Doppler study demonstrated reduced LV function EF 40 -45% with mild LV dilation.  There is also evidence for aortic insufficiency as well as mitral regurgitation.  With her reduction in LV function, I recommended further evaluation with a Lexiscan Myoview study which was done on March 26, 2020.  This did not reveal any ST segment deviation and she had normal perfusion.  The study was interpreted as low risk.  Presently, she denies any chest pain.  She denies shortness of breath.  She has experienced some mild ankle swelling which may be contributed by her Celebrex as well as amlodipine.  She has continued to be on her antihypertensive medical regimen consisting of amlodipine, benazepril, HCTZ in addition to metoprolol succinate now at 50 mg.  Her blood pressure today is stable.  I believe she is stable to undergo planned knee surgery to be done by Dr. Sandie Ano next month.  As long as she is stable, I will see her in 1 year for follow-up evaluation.   Medication Adjustments/Labs and Tests Ordered: Current  medicines are reviewed at length with the patient today.  Concerns regarding medicines are outlined above.  Medication changes, Labs and Tests ordered today are listed in the Patient Instructions below. Patient Instructions  Medication Instructions:  No changes *If you need a refill on your cardiac medications before your next appointment, please call your pharmacy*   Lab Work: None ordered If you have labs (blood work) drawn today and your tests are completely normal, you will receive your results only by: Marland Kitchen MyChart Message (if you have MyChart) OR . A paper copy in the mail If you have any lab test that is abnormal or we need to change your treatment, we will call you to review the results.   Testing/Procedures: None ordered   Follow-Up: At [facility], you and your health needs are our priority.  As part of our continuing mission  to provide you with exceptional heart care, we have created designated Provider Care Teams.  These Care Teams include your primary Cardiologist (physician) and Advanced Practice Providers (APPs -  Physician Assistants and Nurse Practitioners) who all work together to provide you with the care you need, when you need it.  We recommend signing up for the patient portal called "MyChart".  Sign up information is provided on this After Visit Summary.  MyChart is used to connect with patients for Virtual Visits (Telemedicine).  Patients are able to view lab/test results, encounter notes, upcoming appointments, etc.  Non-urgent messages can be sent to your provider as well.   To learn more about what you can do with MyChart, go to [email or web address].    Your next appointment:   12 months  The format for your next appointment:   In Person  Provider:   Shelva Majestic, MD   Other Instructions Dr. Claiborne Billings has cleared you for your knee surgery.     Signed, Shelva Majestic, MD  10/05/2020 12:23 PM    [facility] [address]  [place], [place], [place]  [address] Phone: [phone]

## 2020-10-03 NOTE — Patient Instructions (Addendum)
Medication Instructions:  No changes *If you need a refill on your cardiac medications before your next appointment, please call your pharmacy*   Lab Work: None ordered If you have labs (blood work) drawn today and your tests are completely normal, you will receive your results only by: Marland Kitchen MyChart Message (if you have MyChart) OR . A paper copy in the mail If you have any lab test that is abnormal or we need to change your treatment, we will call you to review the results.   Testing/Procedures: None ordered   Follow-Up: At [facility], you and your health needs are our priority.  As part of our continuing mission to provide you with exceptional heart care, we have created designated Provider Care Teams.  These Care Teams include your primary Cardiologist (physician) and Advanced Practice Providers (APPs -  Physician Assistants and Nurse Practitioners) who all work together to provide you with the care you need, when you need it.  We recommend signing up for the patient portal called "MyChart".  Sign up information is provided on this After Visit Summary.  MyChart is used to connect with patients for Virtual Visits (Telemedicine).  Patients are able to view lab/test results, encounter notes, upcoming appointments, etc.  Non-urgent messages can be sent to your provider as well.   To learn more about what you can do with MyChart, go to [email or web address].    Your next appointment:   12 months  The format for your next appointment:   In Person  Provider:   Nicki Guadalajara, MD   Other Instructions Dr. Tresa Endo has cleared you for your knee surgery.

## 2020-10-05 ENCOUNTER — Encounter: Payer: Self-pay | Admitting: Cardiovascular Disease

## 2020-10-10 DIAGNOSIS — M1711 Unilateral primary osteoarthritis, right knee: Secondary | ICD-10-CM | POA: Diagnosis not present

## 2020-10-10 DIAGNOSIS — Z20822 Contact with and (suspected) exposure to covid-19: Secondary | ICD-10-CM | POA: Diagnosis not present

## 2020-11-14 ENCOUNTER — Telehealth: Payer: Self-pay

## 2020-11-14 NOTE — Telephone Encounter (Signed)
Surgery clearance was already completed by [facility] at patient visit in December- faxed over clearance information to the office.

## 2020-11-22 NOTE — H&P (Signed)
TOTAL KNEE ADMISSION H&P  Patient is being admitted for right total knee arthroplasty.  Subjective:  Chief Complaint: Right knee pain.  HPI: Kristy [name], 80 y.o. female has a history of pain and functional disability in the right knee due to arthritis and has failed non-surgical conservative treatments for greater than 12 weeks to include NSAID's and/or analgesics and activity modification. Onset of symptoms was gradual, starting 5 years ago with gradually worsening course since that time. The patient noted prior procedures on the knee to include  arthroscopy on the right knee.  Patient currently rates pain in the right knee at 5 out of 10 with activity. Patient has worsening of pain with activity and weight bearing and pain that interferes with activities of daily living. Patient has evidence of joint space narrowing by imaging studies. There is no active infection.  Patient Active Problem List   Diagnosis Date Noted  . Hypokalemia 03/07/2020  . Type 2 diabetes mellitus without complication (HCC) 03/07/2020  . IGT (impaired glucose tolerance) 03/14/2019  . Acute labyrinthitis, unspecified laterality 09/08/2018  . Trimalleolar fracture of right ankle 06/24/2018  . Closed trimalleolar fracture of right ankle 06/21/2018  . Osteoarthritis 09/22/2012  . UNSPECIFIED DISORDER TEETH&SUPPORTING STRUCTURES 09/09/2010  . Essential hypertension 04/07/2007  . Allergic rhinitis 04/07/2007  . Asthma 04/07/2007  . GERD 04/07/2007  . COLONIC POLYPS, HX OF 04/07/2007    Past Medical History:  Diagnosis Date  . Allergic rhinitis, seasonal   . Arthritis   . Asthma    bronchial with weather change  . 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 Dysrhythmia    "Skipping a beat"  . GERD (gastroesophageal reflux disease)   . History of colon polyps   . Hypertension   . Pneumonia   . Rotator cuff tear, right     Past Surgical History:  Procedure Laterality Date  . BACK SURGERY      x3  ruptured disc   . CHOLECYSTECTOMY OPEN  AGE 88  . EYE SURGERY     bil cataracts  . INGUINAL HERNIA REPAIR Right AGE 88s  . KNEE ARTHROSCOPY Left 2014  . LUMBAR DISC SURGERY  x2  LAST DATE EARLY 2000s  . ORIF ANKLE FRACTURE Right 06/24/2018   Procedure: OPEN REDUCTION INTERNAL FIXATION (ORIF) RIGHT ANKLE FRACTURE;  Surgeon: Kathryne Hitch, MD;  Location: [facility];  Service: Orthopedics;  Laterality: Right;  . ROTATOR CUFF REPAIR Left 01/2015  . TONSILLECTOMY  AGE 33  . VAGINAL HYSTERECTOMY  AGE 72   WITH BSO    Prior to Admission medications   Medication Sig Start Date End Date Taking? Authorizing Provider  acetaminophen (TYLENOL) 500 MG tablet Take 500-1,000 mg by mouth every 6 (six) hours as needed (pain.).   Yes [provider]  albuterol (VENTOLIN HFA) 108 (90 Base) MCG/ACT inhaler INHALE 2 PUFFS BY MOUTH EVERY 6 HOURS AS NEEDED FOR WHEEZE Patient taking differently: Inhale 2 puffs into the lungs every 6 (six) hours as needed for shortness of breath or wheezing. 03/25/20  Yes Philip Aspen, Limmie Patricia, MD  amLODipine (NORVASC) 10 MG tablet TAKE 1 TABLET BY MOUTH EVERY DAY Patient taking differently: Take 10 mg by mouth daily. 06/29/20  Yes Philip Aspen, Limmie Patricia, MD  benazepril (LOTENSIN) 40 MG tablet TAKE 1 TABLET BY MOUTH EVERY DAY Patient taking differently: Take 40 mg by mouth daily. 06/12/20  Yes Philip Aspen, Limmie Patricia, MD  Capsaicin-Menthol [facility] PAIN REL GEL-PTCH  HOT) 0.025-1.25 % PTCH Place 1 patch onto the skin daily.   Yes [provider]  celecoxib (CELEBREX) 200 MG capsule TAKE 1 CAPSULE BY MOUTH EVERY DAY Patient taking differently: Take 200 mg by mouth every evening. 08/21/20  Yes Philip Aspen, Limmie Patricia, MD  ferrous sulfate 325 (65 FE) MG tablet Take 325 mg by mouth daily with breakfast.   Yes [provider]  fluticasone (FLONASE) 50 MCG/ACT nasal spray SPRAY 2 SPRAYS INTO EACH NOSTRIL EVERY DAY Patient taking  differently: Place 2 sprays into both nostrils daily as needed for allergies. 06/09/19  Yes Philip Aspen, Limmie Patricia, MD  hydrochlorothiazide (HYDRODIURIL) 12.5 MG tablet Take 12.5 mg by mouth daily.   Yes [provider]  metFORMIN (GLUCOPHAGE) 500 MG tablet TAKE 1 TABLET (500 MG TOTAL) BY MOUTH 2 (TWO) TIMES DAILY WITH A MEAL. 08/21/20  Yes Philip Aspen, Limmie Patricia, MD  metoprolol succinate (TOPROL XL) 50 MG 24 hr tablet Take 1 tablet (50 mg total) by mouth daily. 04/17/20  Yes Jodelle Gross, NP  traMADol (ULTRAM) 50 MG tablet TAKE 1 TABLET (50 MG TOTAL) BY MOUTH EVERY 12 (TWELVE) HOURS AS NEEDED. Patient taking differently: Take 50 mg by mouth every 12 (twelve) hours as needed (PAIN.). 08/02/20  Yes Philip Aspen, Limmie Patricia, MD  WIXELA INHUB 250-50 MCG/DOSE AEPB INHALE 1 PUFF BY MOUTH EVERY 12 HOURS Patient taking differently: Inhale 1 puff into the lungs 2 (two) times daily as needed (congestion/difficulty breathing.). 04/04/19  Yes Philip Aspen, Limmie Patricia, MD    Allergies  Allergen Reactions  . Clonidine Hives    Social History   Socioeconomic History  . Marital status: Widowed    Spouse name: Not on file  . Number of children: Not on file  . Years of education: Not on file  . Highest education level: Not on file  Occupational History  . Not on file  Tobacco Use  . Smoking status: Former Smoker    Years: 10.00    Quit date: 10/19/1988    Years since quitting: 32.1  . Smokeless tobacco: Never Used  . Tobacco comment: 1 a day  Vaping Use  . Vaping Use: Never used  Substance and Sexual Activity  . Alcohol use: Yes    Alcohol/week: 3.0 - 4.0 standard drinks    Types: 3 - 4 Glasses of wine per week    Comment: 2-3 times a week  . Drug use: Never  . Sexual activity: Not Currently  Other Topics Concern  . Not on file  Social History Narrative  . Not on file   Social Determinants of Health   Financial Resource Strain: Not on file  Food Insecurity: Not  on file  Transportation Needs: Not on file  Physical Activity: Not on file  Stress: Not on file  Social Connections: Not on file  Intimate Partner Violence: Not on file    Tobacco Use: Medium Risk  . Smoking Tobacco Use: Former Smoker  . Smokeless Tobacco Use: Never Used   Social History   Substance and Sexual Activity  Alcohol Use Yes  . Alcohol/week: 3.0 - 4.0 standard drinks  . Types: 3 - 4 Glasses of wine per week   Comment: 2-3 times a week    Family History  Problem Relation Age of Onset  . Colon cancer Neg Hx   . Esophageal cancer Neg Hx   . Rectal cancer Neg Hx   . Stomach cancer Neg Hx     Review  of Systems  Constitutional: Negative for chills, fever and weight loss.  Respiratory: Negative for cough, shortness of breath and wheezing.   Cardiovascular: Negative for chest pain, palpitations and claudication.  Gastrointestinal: Negative for abdominal pain, nausea and vomiting.  Musculoskeletal: Positive for joint pain.  Neurological: Negative for dizziness and tingling.    Objective:  Physical Exam: Well nourished and well developed.  General: Alert and oriented x3, cooperative and pleasant, no acute distress.  Head: normocephalic, atraumatic, neck supple.  Eyes: EOMI.  Respiratory: breath sounds clear in all fields, no wheezing, rales, or rhonchi. Cardiovascular: Regular rate and rhythm, no murmurs, gallops or rubs.  Abdomen: non-tender to palpation and soft, normoactive bowel sounds. Musculoskeletal:  She has an antalgic gait pattern on the right    Right Knee Exam:  Trace effusion present. No swelling present.  The range of motion is: 0 to approximately 125 degrees.  There is moderate crepitus on range of motion of the knee.  Significant medial joint line tenderness.  No lateral joint line tenderness.  The knee is stable.  The patient has an antalgic gait pattern on the right.   Calves soft and nontender. Motor function intact in LE. Strength 5/5  LE bilaterally. Neuro: Distal pulses 2+. Sensation to light touch intact in LE.  Vital signs in last 24 hours: BP: ()/()  Arterial Line BP: ()/()   Imaging Review AP and lateral x-rays of the right knee dated 10/10/2020 shows bone-on-bone arthritis in the medial compartment with patellofemoral narrowing and marginal osteophyte formation.  Assessment/Plan:  End stage arthritis, right knee   The patient history, physical examination, clinical judgment of the provider and imaging studies are consistent with end stage degenerative joint disease of the right knee and total knee arthroplasty is deemed medically necessary. The treatment options including medical management, injection therapy arthroscopy and arthroplasty were discussed at length. The risks and benefits of total knee arthroplasty were presented and reviewed. The risks due to aseptic loosening, infection, stiffness, patella tracking problems, thromboembolic complications and other imponderables were discussed. The patient acknowledged the explanation, agreed to proceed with the plan and consent was signed. Patient is being admitted for inpatient treatment for surgery, pain control, PT, OT, prophylactic antibiotics, VTE prophylaxis, progressive ambulation and ADLs and discharge planning. The patient is planning to be discharged home.   Patient's anticipated LOS is less than 2 midnights, meeting these requirements: - Lives within 1 hour of care - Has a competent adult at home to recover with post-op recover - NO history of  - Chronic pain requiring opiods  - Diabetes  - Coronary Artery Disease  - Heart failure  - Heart attack  - Stroke  - DVT/VTE  - Cardiac arrhythmia  - Respiratory Failure/COPD  - Renal failure  - Anemia  - Advanced Liver disease   Therapy Plans: [facility] Disposition: Home with son Planned DVT Prophylaxis: Aspirin 325mg  DME Needed: None PCP: Dr. (Checking for clearance) Cardiologist: Dr.  Ardyth Harps (Clearance in Epic) TXA: IV Allergies: Clonidine - hives Anesthesia Concerns: None BMI: 28.3 Last HgbA1c: 6.6  [facility] [address] [facility] [name]  - Patient was instructed on what medications to stop prior to surgery. - Follow-up visit in 2 weeks with Dr. [place] - Begin physical therapy following surgery - Pre-operative lab work as pre-surgical testing - Prescriptions will be provided in hospital at time of discharge  Lequita Halt, [facility], PA-C Orthopedic Surgery [facility] [place]

## 2020-11-26 NOTE — Patient Instructions (Addendum)
DUE TO COVID-19 ONLY ONE VISITOR IS ALLOWED TO COME WITH YOU AND STAY IN THE WAITING ROOM ONLY DURING PRE OP AND PROCEDURE DAY OF SURGERY. THE 1 VISITOR  MAY VISIT WITH YOU AFTER SURGERY IN YOUR PRIVATE ROOM DURING VISITING HOURS ONLY!  YOU NEED TO HAVE A COVID 19 TEST ON: 12/05/20 @ 11:30 AM , THIS TEST MUST BE DONE BEFORE SURGERY,  COVID TESTING SITE [address] [place] [address], IT IS ON THE RIGHT GOING OUT [address] APPROXIMATELY  2 MINUTES PAST [employer] ON THE RIGHT. ONCE YOUR COVID TEST IS COMPLETED,  PLEASE BEGIN THE QUARANTINE INSTRUCTIONS AS OUTLINED IN YOUR HANDOUT.                Kristy [name]    Your procedure is scheduled on: 12/09/20   Report to [facility] Main  Entrance   Report to admitting at : 10:10 AM     Call this number if you have problems the morning of surgery [phone]    Remember:  NO SOLID FOOD AFTER MIDNIGHT THE NIGHT PRIOR TO SURGERY. NOTHING BY MOUTH EXCEPT CLEAR LIQUIDS UNTIL: 9:30 AM . PLEASE FINISH ENSURE DRINK PER SURGEON ORDER  WHICH NEEDS TO BE COMPLETED AT : 9:30 AM.  CLEAR LIQUID DIET  Foods Allowed                                                                     Foods Excluded  Coffee and tea, regular and decaf                             liquids that you cannot  Plain Jell-O any favor except red or purple                                           see through such as: Fruit ices (not with fruit pulp)                                     milk, soups, orange juice  Iced Popsicles                                    All solid food Carbonated beverages, regular and diet                                    Cranberry, grape and apple juices Sports drinks like Gatorade Lightly seasoned clear broth or consume(fat free) Sugar, honey syrup  Sample Menu Breakfast                                Lunch  Supper Cranberry juice                    Beef broth                             Chicken broth Jell-O                                     Grape juice                           Apple juice Coffee or tea                        Jell-O                                      Popsicle                                                Coffee or tea                        Coffee or tea  _____________________________________________________________________   BRUSH YOUR TEETH MORNING OF SURGERY AND RINSE YOUR MOUTH OUT, NO CHEWING GUM CANDY OR MINTS.    Take these medicines the morning of surgery with A SIP OF WATER: amlodipine,metoprolol.Use inhalers and Flonase as usual.  How to Manage Your Diabetes Before and After Surgery  Why is it important to control my blood sugar before and after surgery? . Improving blood sugar levels before and after surgery helps healing and can limit problems. . A way of improving blood sugar control is eating a healthy diet by: o  Eating less sugar and carbohydrates o  Increasing activity/exercise o  Talking with your doctor about reaching your blood sugar goals . High blood sugars (greater than 180 mg/dL) can raise your risk of infections and slow your recovery, so you will need to focus on controlling your diabetes during the weeks before surgery. . Make sure that the doctor who takes care of your diabetes knows about your planned surgery including the date and location.  How do I manage my blood sugar before surgery? . Check your blood sugar at least 4 times a day, starting 2 days before surgery, to make sure that the level is not too high or low. o Check your blood sugar the morning of your surgery when you wake up and every 2 hours until you get to the Short Stay unit. . If your blood sugar is less than 70 mg/dL, you will need to treat for low blood sugar: o Do not take insulin. o Treat a low blood sugar (less than 70 mg/dL) with  cup of clear juice (cranberry or apple), 4 glucose tablets, OR glucose gel. o Recheck blood sugar in 15 minutes  after treatment (to make sure it is greater than 70 mg/dL). If your blood sugar is not greater than 70 mg/dL on recheck, call [phone] for further instructions. . Report your blood sugar to the short stay nurse when you get to Short Stay.  Marland Kitchen  If you are admitted to the hospital after surgery: o Your blood sugar will be checked by the staff and you will probably be given insulin after surgery (instead of oral diabetes medicines) to make sure you have good blood sugar levels. o The goal for blood sugar control after surgery is 80-180 mg/dL.   WHAT DO I DO ABOUT MY DIABETES MEDICATION?  Marland Kitchen Do not take oral diabetes medicines (pills) the morning of surgery.  . THE DAY BEFORE SURGERY, take Metformin as usual.     . THE MORNING OF SURGERY, DO NOT TAKE ANY DIABETIC MEDICATIONS DAY OF YOUR SURGERY                               You may not have any metal on your body including hair pins and              piercings  Do not wear jewelry, make-up, lotions, powders or perfumes, deodorant             Do not wear nail polish on your fingernails.  Do not shave  48 hours prior to surgery.   Do not bring valuables to the hospital. [facility] IS NOT             RESPONSIBLE   FOR VALUABLES.  Contacts, dentures or bridgework may not be worn into surgery.  Leave suitcase in the car. After surgery it may be brought to your room.     Patients discharged the day of surgery will not be allowed to drive home. IF YOU ARE HAVING SURGERY AND GOING HOME THE SAME DAY, YOU MUST HAVE AN ADULT TO DRIVE YOU HOME AND BE WITH YOU FOR 24 HOURS. YOU MAY GO HOME BY TAXI OR UBER OR ORTHERWISE, BUT AN ADULT MUST ACCOMPANY YOU HOME AND STAY WITH YOU FOR 24 HOURS.  Name and phone number of your driver:  Special Instructions: N/A              Please read over the following fact sheets you were given: _____________________________________________________________________         [facility] - Preparing for Surgery Before  surgery, you can play an important role.  Because skin is not sterile, your skin needs to be as free of germs as possible.  You can reduce the number of germs on your skin by washing with CHG (chlorahexidine gluconate) soap before surgery.  CHG is an antiseptic cleaner which kills germs and bonds with the skin to continue killing germs even after washing. Please DO NOT use if you have an allergy to CHG or antibacterial soaps.  If your skin becomes reddened/irritated stop using the CHG and inform your nurse when you arrive at Short Stay. Do not shave (including legs and underarms) for at least 48 hours prior to the first CHG shower.  You may shave your face/neck. Please follow these instructions carefully:  1.  Shower with CHG Soap the night before surgery and the  morning of Surgery.  2.  If you choose to wash your hair, wash your hair first as usual with your  normal  shampoo.  3.  After you shampoo, rinse your hair and body thoroughly to remove the  shampoo.                           4.  Use CHG as you would any other liquid  soap.  You can apply chg directly  to the skin and wash                       Gently with a scrungie or clean washcloth.  5.  Apply the CHG Soap to your body ONLY FROM THE NECK DOWN.   Do not use on face/ open                           Wound or open sores. Avoid contact with eyes, ears mouth and genitals (private parts).                       Wash face,  Genitals (private parts) with your normal soap.             6.  Wash thoroughly, paying special attention to the area where your surgery  will be performed.  7.  Thoroughly rinse your body with warm water from the neck down.  8.  DO NOT shower/wash with your normal soap after using and rinsing off  the CHG Soap.                9.  Pat yourself dry with a clean towel.            10.  Wear clean pajamas.            11.  Place clean sheets on your bed the night of your first shower and do not  sleep with pets. Day of Surgery  : Do not apply any lotions/deodorants the morning of surgery.  Please wear clean clothes to the hospital/surgery center.  FAILURE TO FOLLOW THESE INSTRUCTIONS MAY RESULT IN THE CANCELLATION OF YOUR SURGERY PATIENT SIGNATURE_________________________________  NURSE SIGNATURE__________________________________  ________________________________________________________________________   Rogelia MireIncentive Spirometer  An incentive spirometer is a tool that can help keep your lungs clear and active. This tool measures how well you are filling your lungs with each breath. Taking long deep breaths may help reverse or decrease the chance of developing breathing (pulmonary) problems (especially infection) following:  A long period of time when you are unable to move or be active. BEFORE THE PROCEDURE   If the spirometer includes an indicator to show your best effort, your nurse or respiratory therapist will set it to a desired goal.  If possible, sit up straight or lean slightly forward. Try not to slouch.  Hold the incentive spirometer in an upright position. INSTRUCTIONS FOR USE  1. Sit on the edge of your bed if possible, or sit up as far as you can in bed or on a chair. 2. Hold the incentive spirometer in an upright position. 3. Breathe out normally. 4. Place the mouthpiece in your mouth and seal your lips tightly around it. 5. Breathe in slowly and as deeply as possible, raising the piston or the ball toward the top of the column. 6. Hold your breath for 3-5 seconds or for as long as possible. Allow the piston or ball to fall to the bottom of the column. 7. Remove the mouthpiece from your mouth and breathe out normally. 8. Rest for a few seconds and repeat Steps 1 through 7 at least 10 times every 1-2 hours when you are awake. Take your time and take a few normal breaths between deep breaths. 9. The spirometer may include an indicator to show your best effort. Use the indicator as a goal to  work  toward during each repetition. 10. After each set of 10 deep breaths, practice coughing to be sure your lungs are clear. If you have an incision (the cut made at the time of surgery), support your incision when coughing by placing a pillow or rolled up towels firmly against it. Once you are able to get out of bed, walk around indoors and cough well. You may stop using the incentive spirometer when instructed by your caregiver.  RISKS AND COMPLICATIONS  Take your time so you do not get dizzy or light-headed.  If you are in pain, you may need to take or ask for pain medication before doing incentive spirometry. It is harder to take a deep breath if you are having pain. AFTER USE  Rest and breathe slowly and easily.  It can be helpful to keep track of a log of your progress. Your caregiver can provide you with a simple table to help with this. If you are using the spirometer at home, follow these instructions: SEEK MEDICAL CARE IF:   You are having difficultly using the spirometer.  You have trouble using the spirometer as often as instructed.  Your pain medication is not giving enough relief while using the spirometer.  You develop fever of 100.5 F (38.1 C) or higher. SEEK IMMEDIATE MEDICAL CARE IF:   You cough up bloody sputum that had not been present before.  You develop fever of 102 F (38.9 C) or greater.  You develop worsening pain at or near the incision site. MAKE SURE YOU:   Understand these instructions.  Will watch your condition.  Will get help right away if you are not doing well or get worse. Document Released: 02/15/2007 Document Revised: 12/28/2011 Document Reviewed: 04/18/2007 [facility] Patient Information 2014 [place], [place].   ________________________________________________________________________

## 2020-11-27 ENCOUNTER — Encounter (HOSPITAL_COMMUNITY): Payer: Self-pay

## 2020-11-27 ENCOUNTER — Encounter (HOSPITAL_COMMUNITY)
Admission: RE | Admit: 2020-11-27 | Discharge: 2020-11-27 | Disposition: A | Discharge status: Home / Self Care | Payer: Medicare Other | Source: Ambulatory Visit | Attending: Orthopedic Surgery | Admitting: Orthopedic Surgery

## 2020-11-27 ENCOUNTER — Other Ambulatory Visit: Payer: Self-pay

## 2020-11-27 DIAGNOSIS — Z79891 Long term (current) use of opiate analgesic: Secondary | ICD-10-CM | POA: Diagnosis not present

## 2020-11-27 DIAGNOSIS — I10 Essential (primary) hypertension: Secondary | ICD-10-CM | POA: Insufficient documentation

## 2020-11-27 DIAGNOSIS — Z7984 Long term (current) use of oral hypoglycemic drugs: Secondary | ICD-10-CM | POA: Insufficient documentation

## 2020-11-27 DIAGNOSIS — K219 Gastro-esophageal reflux disease without esophagitis: Secondary | ICD-10-CM | POA: Insufficient documentation

## 2020-11-27 DIAGNOSIS — Z79899 Other long term (current) drug therapy: Secondary | ICD-10-CM | POA: Diagnosis not present

## 2020-11-27 DIAGNOSIS — M1711 Unilateral primary osteoarthritis, right knee: Secondary | ICD-10-CM | POA: Diagnosis not present

## 2020-11-27 DIAGNOSIS — Z7951 Long term (current) use of inhaled steroids: Secondary | ICD-10-CM | POA: Diagnosis not present

## 2020-11-27 DIAGNOSIS — E119 Type 2 diabetes mellitus without complications: Secondary | ICD-10-CM | POA: Diagnosis not present

## 2020-11-27 DIAGNOSIS — Z01812 Encounter for preprocedural laboratory examination: Secondary | ICD-10-CM | POA: Insufficient documentation

## 2020-11-27 DIAGNOSIS — J45909 Unspecified asthma, uncomplicated: Secondary | ICD-10-CM | POA: Insufficient documentation

## 2020-11-27 LAB — APTT: aPTT: 32 seconds (ref 24–36)

## 2020-11-27 LAB — COMPREHENSIVE METABOLIC PANEL
ALT: 12 U/L (ref 0–44)
AST: 16 U/L (ref 15–41)
Albumin: 4.6 g/dL (ref 3.5–5.0)
Alkaline Phosphatase: 53 U/L (ref 38–126)
Anion gap: 13 (ref 5–15)
BUN: 19 mg/dL (ref 8–23)
CO2: 27 mmol/L (ref 22–32)
Calcium: 9.7 mg/dL (ref 8.9–10.3)
Chloride: 100 mmol/L (ref 98–111)
Creatinine, Ser: 0.65 mg/dL (ref 0.44–1.00)
GFR, Estimated: >60 mL/min (ref >60)
Glucose, Bld: 98 mg/dL (ref 70–99)
Potassium: 3.4 mmol/L — ABNORMAL LOW (ref 3.5–5.1)
Sodium: 140 mmol/L (ref 135–145)
Total Bilirubin: 0.6 mg/dL (ref 0.3–1.2)
Total Protein: 8 g/dL (ref 6.5–8.1)

## 2020-11-27 LAB — CBC
HCT: 39.3 % (ref 36.0–46.0)
Hemoglobin: 12.6 g/dL (ref 12.0–15.0)
MCH: 30.1 pg (ref 26.0–34.0)
MCHC: 32.1 g/dL (ref 30.0–36.0)
MCV: 94 fL (ref 80.0–100.0)
Platelets: 337 10*3/uL (ref 150–400)
RBC: 4.18 MIL/uL (ref 3.87–5.11)
RDW: 13.2 % (ref 11.5–15.5)
WBC: 7.9 10*3/uL (ref 4.0–10.5)
nRBC: 0 % (ref 0.0–0.2)

## 2020-11-27 LAB — SURGICAL PCR SCREEN
MRSA, PCR: NEGATIVE
Staphylococcus aureus: NEGATIVE

## 2020-11-27 LAB — PROTIME-INR
INR: 1 (ref 0.8–1.2)
Prothrombin Time: 12.7 seconds (ref 11.4–15.2)

## 2020-11-27 LAB — HEMOGLOBIN A1C
Hgb A1c MFr Bld: 5.7 % — ABNORMAL HIGH (ref 4.8–5.6)
Mean Plasma Glucose: 116.89 mg/dL

## 2020-11-27 LAB — GLUCOSE, CAPILLARY: Glucose-Capillary: 105 mg/dL — ABNORMAL HIGH (ref 70–99)

## 2020-11-27 NOTE — Progress Notes (Signed)
COVID Vaccine Completed: Yes Date COVID Vaccine completed: 07/18/20 [place] COVID vaccine manufacturer: Pfizer     PCP - Dr. Ted Mcalpine Cardiologist - Dr. Claudette Laws.: Clearance: 10/03/20  Chest x-ray -  EKG - 04/26/20 Stress Test -  ECHO - 03/12/20 Cardiac Cath -  Pacemaker/ICD device last checked:  Sleep Study -  CPAP -   Fasting Blood Sugar -  Checks Blood Sugar _____ times a day  Blood Thinner Instructions: Aspirin Instructions: Last Dose:  Anesthesia review: Hx: HTN,DIA,Dysrhythmias.  Patient denies shortness of breath, fever, cough and chest pain at PAT appointment   Patient verbalized understanding of instructions that were given to them at the PAT appointment. Patient was also instructed that they will need to review over the PAT instructions again at home before surgery.

## 2020-11-28 ENCOUNTER — Encounter: Payer: Self-pay | Admitting: Internal Medicine

## 2020-11-28 ENCOUNTER — Ambulatory Visit (INDEPENDENT_AMBULATORY_CARE_PROVIDER_SITE_OTHER): Payer: Medicare Other | Admitting: Internal Medicine

## 2020-11-28 ENCOUNTER — Other Ambulatory Visit: Payer: Self-pay

## 2020-11-28 VITALS — BP 120/70 | HR 75 | Temp 97.5°F | Wt 156.5 lb

## 2020-11-28 DIAGNOSIS — E119 Type 2 diabetes mellitus without complications: Secondary | ICD-10-CM

## 2020-11-28 DIAGNOSIS — Z01818 Encounter for other preprocedural examination: Secondary | ICD-10-CM

## 2020-11-28 DIAGNOSIS — I10 Essential (primary) hypertension: Secondary | ICD-10-CM

## 2020-11-28 LAB — POCT GLYCOSYLATED HEMOGLOBIN (HGB A1C): Hemoglobin A1C: 5.6 % (ref 4.0–5.6)

## 2020-11-28 NOTE — Patient Instructions (Signed)
-  Nice seeing you today!!  -Schedule follow up in 3 months. 

## 2020-11-28 NOTE — Progress Notes (Signed)
Established Patient Office Visit     This visit occurred during the SARS-CoV-2 public health emergency.  Safety protocols were in place, including screening questions prior to the visit, additional usage of staff PPE, and extensive cleaning of exam room while observing appropriate contact time as indicated for disinfecting solutions.    CC/Reason for Visit: Preoperative clearance, follow-up diabetes  HPI: Kristy [name] is a 80 y.o. female who is coming in today for the above mentioned reasons. Past Medical History is significant for: Hypertension, asthma, GERD.  She is having a right knee replacement later this month.  She has already been cleared by cardiology for it.  She was diagnosed last spring with diabetes with an A1c of 6.6.  She has been taking Metformin 500 mg twice daily.   Past Medical/Surgical History: Past Medical History:  Diagnosis Date  . Allergic rhinitis, seasonal   . Arthritis   . Asthma    bronchial with weather change  . 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 Dysrhythmia    "Skipping a beat"  . GERD (gastroesophageal reflux disease)   . History of colon polyps   . Hypertension   . Pneumonia   . Rotator cuff tear, right     Past Surgical History:  Procedure Laterality Date  . BACK SURGERY     x3  ruptured disc   . CHOLECYSTECTOMY OPEN  AGE 44  . EYE SURGERY     bil cataracts  . HERNIA REPAIR    . INGUINAL HERNIA REPAIR Right AGE 44s  . KNEE ARTHROSCOPY Left 2014  . LUMBAR DISC SURGERY  x2  LAST DATE EARLY 2000s  . ORIF ANKLE FRACTURE Right 06/24/2018   Procedure: OPEN REDUCTION INTERNAL FIXATION (ORIF) RIGHT ANKLE FRACTURE;  Surgeon: Kathryne Hitch, MD;  Location: [facility];  Service: Orthopedics;  Laterality: Right;  . ROTATOR CUFF REPAIR Left 01/2015  . TONSILLECTOMY  AGE 6  . VAGINAL HYSTERECTOMY  AGE 63   WITH BSO    Social History:  reports that she quit smoking about 32 years ago. She quit after 10.00 years  of use. She has never used smokeless tobacco. She reports current alcohol use of about 3.0 - 4.0 standard drinks of alcohol per week. She reports that she does not use drugs.  Allergies: Allergies  Allergen Reactions  . Clonidine Hives    Family History:  Family History  Problem Relation Age of Onset  . Colon cancer Neg Hx   . Esophageal cancer Neg Hx   . Rectal cancer Neg Hx   . Stomach cancer Neg Hx      Current Outpatient Medications:  .  acetaminophen (TYLENOL) 500 MG tablet, Take 500-1,000 mg by mouth every 6 (six) hours as needed (pain.)., Disp: , Rfl:  .  albuterol (VENTOLIN HFA) 108 (90 Base) MCG/ACT inhaler, INHALE 2 PUFFS BY MOUTH EVERY 6 HOURS AS NEEDED FOR WHEEZE (Patient taking differently: Inhale 2 puffs into the lungs every 6 (six) hours as needed for shortness of breath or wheezing.), Disp: 6.7 g, Rfl: 1 .  amLODipine (NORVASC) 10 MG tablet, TAKE 1 TABLET BY MOUTH EVERY DAY (Patient taking differently: Take 10 mg by mouth daily.), Disp: 90 tablet, Rfl: 1 .  benazepril (LOTENSIN) 40 MG tablet, TAKE 1 TABLET BY MOUTH EVERY DAY (Patient taking differently: Take 40 mg by mouth daily.), Disp: 90 tablet, Rfl: 1 .  Capsaicin-Menthol (SALONPAS PAIN REL GEL-PTCH HOT) 0.025-1.25 % PTCH,  Place 1 patch onto the skin daily., Disp: , Rfl:  .  celecoxib (CELEBREX) 200 MG capsule, TAKE 1 CAPSULE BY MOUTH EVERY DAY (Patient taking differently: Take 200 mg by mouth every evening.), Disp: 90 capsule, Rfl: 1 .  ferrous sulfate 325 (65 FE) MG tablet, Take 325 mg by mouth daily with breakfast., Disp: , Rfl:  .  fluticasone (FLONASE) 50 MCG/ACT nasal spray, SPRAY 2 SPRAYS INTO EACH NOSTRIL EVERY DAY (Patient taking differently: Place 2 sprays into both nostrils daily as needed for allergies.), Disp: 48 mL, Rfl: 0 .  hydrochlorothiazide (HYDRODIURIL) 12.5 MG tablet, Take 12.5 mg by mouth daily., Disp: , Rfl:  .  metFORMIN (GLUCOPHAGE) 500 MG tablet, TAKE 1 TABLET (500 MG TOTAL) BY MOUTH 2 (TWO)  TIMES DAILY WITH A MEAL., Disp: 180 tablet, Rfl: 1 .  metoprolol succinate (TOPROL XL) 50 MG 24 hr tablet, Take 1 tablet (50 mg total) by mouth daily., Disp: 90 tablet, Rfl: 3 .  traMADol (ULTRAM) 50 MG tablet, TAKE 1 TABLET (50 MG TOTAL) BY MOUTH EVERY 12 (TWELVE) HOURS AS NEEDED. (Patient taking differently: Take 50 mg by mouth every 12 (twelve) hours as needed (PAIN.).), Disp: 60 tablet, Rfl: 0 .  WIXELA INHUB 250-50 MCG/DOSE AEPB, INHALE 1 PUFF BY MOUTH EVERY 12 HOURS (Patient taking differently: Inhale 1 puff into the lungs 2 (two) times daily as needed (congestion/difficulty breathing.).), Disp: 180 each, Rfl: 1  Review of Systems:  Constitutional: Denies fever, chills, diaphoresis, appetite change and fatigue.  HEENT: Denies photophobia, eye pain, redness, hearing loss, ear pain, congestion, sore throat, rhinorrhea, sneezing, mouth sores, trouble swallowing, neck pain, neck stiffness and tinnitus.   Respiratory: Denies SOB, DOE, cough, chest tightness,  and wheezing.   Cardiovascular: Denies chest pain, palpitations and leg swelling.  Gastrointestinal: Denies nausea, vomiting, abdominal pain, diarrhea, constipation, blood in stool and abdominal distention.  Genitourinary: Denies dysuria, urgency, frequency, hematuria, flank pain and difficulty urinating.  Endocrine: Denies: hot or cold intolerance, sweats, changes in hair or nails, polyuria, polydipsia. Musculoskeletal: Denies myalgias, back pain, joint swelling, arthralgias and gait problem.  Skin: Denies pallor, rash and wound.  Neurological: Denies dizziness, seizures, syncope, weakness, light-headedness, numbness and headaches.  Hematological: Denies adenopathy. Easy bruising, personal or family bleeding history  Psychiatric/Behavioral: Denies suicidal ideation, mood changes, confusion, nervousness, sleep disturbance and agitation    Physical Exam: Vitals:   11/28/20 1010  BP: 120/70  Pulse: 75  Temp: (!) 97.5 F (36.4 C)   TempSrc: Oral  SpO2: 96%  Weight: 156 lb 8 oz (71 kg)    Body mass index is 29.57 kg/m.   Constitutional: NAD, calm, comfortable Eyes: PERRL, lids and conjunctivae normal ENMT: Mucous membranes are moist.  Respiratory: clear to auscultation bilaterally, no wheezing, no crackles. Normal respiratory effort. No accessory muscle use.  Cardiovascular: Regular rate and rhythm, no murmurs / rubs / gallops. No extremity edema.  Neurologic: Grossly intact and nonfocal Psychiatric: Normal judgment and insight. Alert and oriented x 3. Normal mood.    Impression and Plan:  Preoperative clearance -Has already been cleared by cardiology to proceed with surgery, since diabetes is well controlled she is medically cleared as well.  Type 2 diabetes mellitus without complication, without long-term current use of insulin (HCC) -Excellent control with an A1c of 5.6 today. -Continue Metformin 500 mg twice daily, schedule follow-up in 3 months, if A1c remains in the 5 range can consider dropping Metformin dose to once daily.  Essential hypertension -Well-controlled   Patient  Instructions  -Nice seeing you today!!  -Schedule follow up in 3 months.     Chaya Jan, MD  Primary Care at [facility]

## 2020-11-29 NOTE — Anesthesia Preprocedure Evaluation (Addendum)
Anesthesia Evaluation  Patient identified by MRN, date of birth, ID band Patient awake    Reviewed: Allergy & Precautions, NPO status , Patient's Chart, lab work & pertinent test results  Airway Mallampati: II  TM Distance: >3 FB Neck ROM: Full    Dental  (+) Teeth Intact   Pulmonary asthma , former smoker,    Pulmonary exam normal        Cardiovascular hypertension, Pt. on medications and Pt. on home beta blockers  Rhythm:Regular Rate:Normal     Neuro/Psych negative neurological ROS  negative psych ROS   GI/Hepatic Neg liver ROS, GERD  Controlled,  Endo/Other  diabetes, Well Controlled, Type 2, Oral Hypoglycemic Agents  Renal/GU negative Renal ROS  negative genitourinary   Musculoskeletal  (+) Arthritis , Osteoarthritis,    Abdominal (+)  Abdomen: soft. Bowel sounds: normal.  Peds  Hematology negative hematology ROS (+)   Anesthesia Other Findings   Reproductive/Obstetrics                            Anesthesia Physical Anesthesia Plan  ASA: II  Anesthesia Plan: MAC, Regional and Spinal   Post-op Pain Management:  Regional for Post-op pain   Induction: Intravenous  PONV Risk Score and Plan: 2 and Ondansetron, Dexamethasone, Treatment may vary due to age or medical condition and Propofol infusion  Airway Management Planned: Simple Face Mask, Natural Airway and Nasal Cannula  Additional Equipment: None  Intra-op Plan:   Post-operative Plan:   Informed Consent: I have reviewed the patients History and Physical, chart, labs and discussed the procedure including the risks, benefits and alternatives for the proposed anesthesia with the patient or authorized representative who has indicated his/her understanding and acceptance.     Dental advisory given  Plan Discussed with: CRNA  Anesthesia Plan Comments: (See PAT note 11/27/2020, Konrad Felix, PA-C Lab Results       Component                Value               Date                      WBC                      7.9                 11/27/2020                HGB                      12.6                11/27/2020                HCT                      39.3                11/27/2020                MCV                      94.0                11/27/2020  PLT                      337                 11/27/2020           Lab Results      Component                Value               Date                      NA                       140                 11/27/2020                K                        3.4 (L)             11/27/2020                CO2                      27                  11/27/2020                GLUCOSE                  98                  11/27/2020                BUN                      19                  11/27/2020                CREATININE               0.65                11/27/2020                CALCIUM                  9.7                 11/27/2020                GFRNONAA                 >60                 11/27/2020                GFRAA                    >60                 03/15/2020          )       Anesthesia Quick Evaluation

## 2020-11-29 NOTE — Progress Notes (Signed)
Anesthesia Chart Review   Case: [id number] Date/Time: 12/09/20 1225   Procedure: TOTAL KNEE ARTHROPLASTY (Right Knee) -   Anesthesia type: Choice   Pre-op diagnosis: right knee osteoarthritis   Location: [facility]OM 10 / [facility]   Surgeons: Ollen Gross, MD      DISCUSSION:80 y.o. former smoker with h/o HTN, GERD, DM II, asthma, right knee OA scheduled for above procedure 12/09/2020 with Dr. Ollen Gross.   Pt seen by PCP 11/28/2020. Per OV note, "Preoperative clearance -Has already been cleared by cardiology to proceed with surgery, since diabetes is well controlled she is medically cleared as well."  Pt last seen by cardiology 10/03/2020. Per OV note, " I believe she is stable to undergo planned knee surgery to be done by Dr. Ethlyn Gallery next month."  Anticipate pt can proceed with planned procedure barring acute status change.   VS: BP 138/84   Pulse 84   Temp 36.8 C (Oral)   Ht 5\' 1"  (1.549 m)   Wt 68.9 kg   SpO2 100%   BMI 28.72 kg/m   PROVIDERS: , Philip Aspen, MD is PCP   Limmie Patricia, MD is Cardiologist  LABS: Labs reviewed: Acceptable for surgery. (all labs ordered are listed, but only abnormal results are displayed)  Labs Reviewed  HEMOGLOBIN A1C - Abnormal; Notable for the following components:      Result Value   Hgb A1c MFr Bld 5.7 (*)    All other components within normal limits  COMPREHENSIVE METABOLIC PANEL - Abnormal; Notable for the following components:   Potassium 3.4 (*)    All other components within normal limits  GLUCOSE, CAPILLARY - Abnormal; Notable for the following components:   Glucose-Capillary 105 (*)    All other components within normal limits  SURGICAL PCR SCREEN  CBC  PROTIME-INR  APTT  TYPE AND SCREEN     IMAGES:   EKG: 04/26/2020 NSR Nonspecific T wave abnormality   CV: Stress Test 03/26/2020  There was no ST segment deviation noted during stress.  The study is normal.  This is a low risk study.   Normal  pharmacologic nuclear stress test with no prior infarct and no ischemia.   Echo 03/12/2020 IMPRESSIONS    1. Mild to moderate LV dysfunction; grade 1 diastolic dysfunction; mild  LVE; mild AI and MR; mild LAE.  2. Left ventricular ejection fraction, by estimation, is 40 to 45%. The  left ventricle has mildly decreased function. The left ventricle  demonstrates global hypokinesis. The left ventricular internal cavity size  was mildly dilated. Left ventricular  diastolic parameters are consistent with Grade I diastolic dysfunction  (impaired relaxation).  3. Right ventricular systolic function is normal. The right ventricular  size is normal. There is mildly elevated pulmonary artery systolic  pressure.  4. Left atrial size was mildly dilated.  5. The mitral valve is normal in structure. Mild mitral valve  regurgitation. No evidence of mitral stenosis.  6. The aortic valve is tricuspid. Aortic valve regurgitation is mild. No  aortic stenosis is present.  7. The inferior vena cava is normal in size with greater than 50%  respiratory variability, suggesting right atrial pressure of 3 mmHg. Past Medical History:  Diagnosis Date  . Allergic rhinitis, seasonal   . Arthritis   . Asthma    bronchial with weather change  . 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 Dysrhythmia    "Skipping a beat"  . GERD (  gastroesophageal reflux disease)   . History of colon polyps   . Hypertension   . Pneumonia   . Rotator cuff tear, right     Past Surgical History:  Procedure Laterality Date  . BACK SURGERY     x3  ruptured disc   . CHOLECYSTECTOMY OPEN  AGE 34  . EYE SURGERY     bil cataracts  . HERNIA REPAIR    . INGUINAL HERNIA REPAIR Right AGE 34s  . KNEE ARTHROSCOPY Left 2014  . LUMBAR DISC SURGERY  x2  LAST DATE EARLY 2000s  . ORIF ANKLE FRACTURE Right 06/24/2018   Procedure: OPEN REDUCTION INTERNAL FIXATION (ORIF) RIGHT ANKLE FRACTURE;  Surgeon: Kathryne Hitch, MD;  Location: [facility];  Service: Orthopedics;  Laterality: Right;  . ROTATOR CUFF REPAIR Left 01/2015  . TONSILLECTOMY  AGE 68  . VAGINAL HYSTERECTOMY  AGE 75   WITH BSO    MEDICATIONS: . acetaminophen (TYLENOL) 500 MG tablet  . albuterol (VENTOLIN HFA) 108 (90 Base) MCG/ACT inhaler  . amLODipine (NORVASC) 10 MG tablet  . benazepril (LOTENSIN) 40 MG tablet  . Capsaicin-Menthol (SALONPAS PAIN REL GEL-PTCH HOT) 0.025-1.25 % PTCH  . celecoxib (CELEBREX) 200 MG capsule  . ferrous sulfate 325 (65 FE) MG tablet  . fluticasone (FLONASE) 50 MCG/ACT nasal spray  . hydrochlorothiazide (HYDRODIURIL) 12.5 MG tablet  . metFORMIN (GLUCOPHAGE) 500 MG tablet  . metoprolol succinate (TOPROL XL) 50 MG 24 hr tablet  . traMADol (ULTRAM) 50 MG tablet  . WIXELA INHUB 250-50 MCG/DOSE AEPB   No current facility-administered medications for this encounter.    Jodell Cipro, PA-C [facility] [phone]

## 2020-12-05 ENCOUNTER — Other Ambulatory Visit (HOSPITAL_COMMUNITY)
Admission: RE | Admit: 2020-12-05 | Discharge: 2020-12-05 | Disposition: A | Discharge status: Home / Self Care | Payer: Medicare Other | Source: Ambulatory Visit | Attending: Orthopedic Surgery | Admitting: Orthopedic Surgery

## 2020-12-05 DIAGNOSIS — Z20822 Contact with and (suspected) exposure to covid-19: Secondary | ICD-10-CM | POA: Insufficient documentation

## 2020-12-05 DIAGNOSIS — Z01812 Encounter for preprocedural laboratory examination: Secondary | ICD-10-CM | POA: Diagnosis not present

## 2020-12-05 LAB — SARS CORONAVIRUS 2 (TAT 6-24 HRS): SARS Coronavirus 2: NEGATIVE

## 2020-12-08 MED ORDER — BUPIVACAINE LIPOSOME 1.3 % IJ SUSP
20.0000 mL | Freq: Once | INTRAMUSCULAR | Status: DC
  Filled 2020-12-08: qty 20

## 2020-12-09 ENCOUNTER — Ambulatory Visit (HOSPITAL_COMMUNITY): Payer: Medicare Other | Admitting: Physician Assistant

## 2020-12-09 ENCOUNTER — Ambulatory Visit (HOSPITAL_COMMUNITY): Payer: Medicare Other | Admitting: Certified Registered Nurse Anesthetist

## 2020-12-09 ENCOUNTER — Encounter (HOSPITAL_COMMUNITY): Payer: Self-pay | Admitting: Orthopedic Surgery

## 2020-12-09 ENCOUNTER — Encounter (HOSPITAL_COMMUNITY)
Admission: RE | Disposition: A | Discharge status: Home / Self Care | Payer: Self-pay | Source: Home / Self Care | Attending: Orthopedic Surgery

## 2020-12-09 ENCOUNTER — Observation Stay (HOSPITAL_COMMUNITY)
Admission: RE | Admit: 2020-12-09 | Discharge: 2020-12-10 | Disposition: A | Discharge status: Home / Self Care | Payer: Medicare Other | Attending: Orthopedic Surgery | Admitting: Orthopedic Surgery

## 2020-12-09 ENCOUNTER — Other Ambulatory Visit: Payer: Self-pay | Admitting: Internal Medicine

## 2020-12-09 ENCOUNTER — Other Ambulatory Visit: Payer: Self-pay

## 2020-12-09 DIAGNOSIS — Z87891 Personal history of nicotine dependence: Secondary | ICD-10-CM | POA: Diagnosis not present

## 2020-12-09 DIAGNOSIS — Z96652 Presence of left artificial knee joint: Secondary | ICD-10-CM | POA: Insufficient documentation

## 2020-12-09 DIAGNOSIS — I10 Essential (primary) hypertension: Secondary | ICD-10-CM | POA: Insufficient documentation

## 2020-12-09 DIAGNOSIS — M1711 Unilateral primary osteoarthritis, right knee: Principal | ICD-10-CM | POA: Diagnosis present

## 2020-12-09 DIAGNOSIS — E119 Type 2 diabetes mellitus without complications: Secondary | ICD-10-CM | POA: Insufficient documentation

## 2020-12-09 DIAGNOSIS — M1710 Unilateral primary osteoarthritis, unspecified knee: Secondary | ICD-10-CM

## 2020-12-09 DIAGNOSIS — G8918 Other acute postprocedural pain: Secondary | ICD-10-CM | POA: Diagnosis not present

## 2020-12-09 DIAGNOSIS — M179 Osteoarthritis of knee, unspecified: Secondary | ICD-10-CM | POA: Diagnosis present

## 2020-12-09 DIAGNOSIS — Z79899 Other long term (current) drug therapy: Secondary | ICD-10-CM | POA: Diagnosis not present

## 2020-12-09 DIAGNOSIS — Z7984 Long term (current) use of oral hypoglycemic drugs: Secondary | ICD-10-CM | POA: Insufficient documentation

## 2020-12-09 DIAGNOSIS — J45909 Unspecified asthma, uncomplicated: Secondary | ICD-10-CM | POA: Insufficient documentation

## 2020-12-09 DIAGNOSIS — E876 Hypokalemia: Secondary | ICD-10-CM | POA: Diagnosis not present

## 2020-12-09 HISTORY — PX: TOTAL KNEE ARTHROPLASTY: SHX125

## 2020-12-09 LAB — GLUCOSE, CAPILLARY
Glucose-Capillary: 226 mg/dL — ABNORMAL HIGH (ref 70–99)
Glucose-Capillary: 72 mg/dL (ref 70–99)
Glucose-Capillary: 168 mg/dL — ABNORMAL HIGH (ref 70–99)
Glucose-Capillary: 194 mg/dL — ABNORMAL HIGH (ref 70–99)

## 2020-12-09 LAB — TYPE AND SCREEN
ABO/RH(D): O POS
Antibody Screen: NEGATIVE

## 2020-12-09 SURGERY — ARTHROPLASTY, KNEE, TOTAL
Anesthesia: Monitor Anesthesia Care | Site: Knee | Laterality: Right

## 2020-12-09 MED ORDER — FERROUS SULFATE 325 (65 FE) MG PO TABS
325.0000 mg | ORAL_TABLET | Freq: Every day | ORAL | Status: DC
  Administered 2020-12-10: 325 mg via ORAL
  Filled 2020-12-09: qty 1

## 2020-12-09 MED ORDER — HYDROCHLOROTHIAZIDE 25 MG PO TABS
12.5000 mg | ORAL_TABLET | Freq: Every day | ORAL | Status: DC
  Administered 2020-12-10: 12.5 mg via ORAL
  Filled 2020-12-09: qty 1

## 2020-12-09 MED ORDER — ONDANSETRON HCL 4 MG/2ML IJ SOLN
4.0000 mg | Freq: Four times a day (QID) | INTRAMUSCULAR | Status: DC | PRN
  Administered 2020-12-10: 4 mg via INTRAVENOUS
  Filled 2020-12-09 (×2): qty 2

## 2020-12-09 MED ORDER — FLUTICASONE PROPIONATE 50 MCG/ACT NA SUSP
2.0000 | Freq: Every day | NASAL | Status: DC | PRN
  Filled 2020-12-09: qty 16

## 2020-12-09 MED ORDER — SODIUM CHLORIDE 0.9 % IV SOLN: INTRAVENOUS | Status: DC

## 2020-12-09 MED ORDER — FLEET ENEMA 7-19 GM/118ML RE ENEM: 1.0000 | ENEMA | Freq: Once | RECTAL | Status: DC | PRN

## 2020-12-09 MED ORDER — PHENYLEPHRINE 40 MCG/ML (10ML) SYRINGE FOR IV PUSH (FOR BLOOD PRESSURE SUPPORT)
PREFILLED_SYRINGE | INTRAVENOUS | Status: AC
  Filled 2020-12-09: qty 10

## 2020-12-09 MED ORDER — ACETAMINOPHEN 325 MG PO TABS
325.0000 mg | ORAL_TABLET | Freq: Four times a day (QID) | ORAL | Status: DC | PRN
  Administered 2020-12-10: 650 mg via ORAL
  Filled 2020-12-09: qty 2

## 2020-12-09 MED ORDER — METHOCARBAMOL 500 MG PO TABS
500.0000 mg | ORAL_TABLET | Freq: Four times a day (QID) | ORAL | Status: DC | PRN
  Administered 2020-12-09 – 2020-12-10 (×3): 500 mg via ORAL
  Filled 2020-12-09 (×4): qty 1

## 2020-12-09 MED ORDER — ONDANSETRON HCL 4 MG PO TABS: 4.0000 mg | ORAL_TABLET | Freq: Four times a day (QID) | ORAL | Status: DC | PRN

## 2020-12-09 MED ORDER — CEFAZOLIN SODIUM-DEXTROSE 2-4 GM/100ML-% IV SOLN
2.0000 g | Freq: Four times a day (QID) | INTRAVENOUS | Status: AC
  Administered 2020-12-09 – 2020-12-10 (×2): 2 g via INTRAVENOUS
  Filled 2020-12-09 (×2): qty 100

## 2020-12-09 MED ORDER — HYDROMORPHONE HCL 1 MG/ML IJ SOLN
0.2500 mg | INTRAMUSCULAR | Status: DC | PRN
  Administered 2020-12-09 (×2): 0.5 mg via INTRAVENOUS

## 2020-12-09 MED ORDER — OXYCODONE HCL 5 MG PO TABS
ORAL_TABLET | ORAL | Status: AC
  Administered 2020-12-09: 5 mg
  Filled 2020-12-09: qty 1

## 2020-12-09 MED ORDER — ROPIVACAINE HCL 7.5 MG/ML IJ SOLN
INTRAMUSCULAR | Status: DC | PRN
  Administered 2020-12-09: 20 mL via PERINEURAL

## 2020-12-09 MED ORDER — INSULIN ASPART 100 UNIT/ML ~~LOC~~ SOLN
0.0000 [IU] | Freq: Three times a day (TID) | SUBCUTANEOUS | Status: DC
  Administered 2020-12-09: 3 [IU] via SUBCUTANEOUS
  Administered 2020-12-10 (×2): 2 [IU] via SUBCUTANEOUS

## 2020-12-09 MED ORDER — 0.9 % SODIUM CHLORIDE (POUR BTL) OPTIME
TOPICAL | Status: DC | PRN
  Administered 2020-12-09: 1000 mL

## 2020-12-09 MED ORDER — POVIDONE-IODINE 10 % EX SWAB
2.0000 "application " | Freq: Once | CUTANEOUS | Status: AC
  Administered 2020-12-09: 2 via TOPICAL

## 2020-12-09 MED ORDER — DOCUSATE SODIUM 100 MG PO CAPS
100.0000 mg | ORAL_CAPSULE | Freq: Two times a day (BID) | ORAL | Status: DC
  Administered 2020-12-09 – 2020-12-10 (×2): 100 mg via ORAL
  Filled 2020-12-09 (×2): qty 1

## 2020-12-09 MED ORDER — ACETAMINOPHEN 10 MG/ML IV SOLN
1000.0000 mg | Freq: Four times a day (QID) | INTRAVENOUS | Status: DC
  Administered 2020-12-09: 1000 mg via INTRAVENOUS
  Filled 2020-12-09: qty 100

## 2020-12-09 MED ORDER — LACTATED RINGERS IV SOLN: INTRAVENOUS | Status: DC

## 2020-12-09 MED ORDER — PHENYLEPHRINE HCL-NACL 10-0.9 MG/250ML-% IV SOLN
INTRAVENOUS | Status: DC | PRN
  Administered 2020-12-09: 35 ug/min via INTRAVENOUS

## 2020-12-09 MED ORDER — HYDROMORPHONE HCL 1 MG/ML IJ SOLN
INTRAMUSCULAR | Status: AC
  Filled 2020-12-09: qty 1

## 2020-12-09 MED ORDER — TRANEXAMIC ACID-NACL 1000-0.7 MG/100ML-% IV SOLN
1000.0000 mg | INTRAVENOUS | Status: AC
  Administered 2020-12-09: 1000 mg via INTRAVENOUS
  Filled 2020-12-09: qty 100

## 2020-12-09 MED ORDER — INSULIN ASPART 100 UNIT/ML ~~LOC~~ SOLN: 0.0000 [IU] | Freq: Every day | SUBCUTANEOUS | Status: DC

## 2020-12-09 MED ORDER — PROPOFOL 10 MG/ML IV BOLUS
INTRAVENOUS | Status: DC | PRN
  Administered 2020-12-09: 30 mg via INTRAVENOUS

## 2020-12-09 MED ORDER — OXYCODONE HCL 5 MG PO TABS
5.0000 mg | ORAL_TABLET | ORAL | Status: DC | PRN
  Administered 2020-12-09 – 2020-12-10 (×5): 10 mg via ORAL
  Filled 2020-12-09 (×6): qty 2

## 2020-12-09 MED ORDER — ONDANSETRON HCL 4 MG/2ML IJ SOLN
INTRAMUSCULAR | Status: AC
  Filled 2020-12-09: qty 2

## 2020-12-09 MED ORDER — METHOCARBAMOL 500 MG IVPB - SIMPLE MED
INTRAVENOUS | Status: AC
  Filled 2020-12-09: qty 50

## 2020-12-09 MED ORDER — TRAMADOL HCL 50 MG PO TABS
50.0000 mg | ORAL_TABLET | Freq: Four times a day (QID) | ORAL | Status: DC | PRN
  Administered 2020-12-09 – 2020-12-10 (×2): 100 mg via ORAL
  Filled 2020-12-09 (×2): qty 2

## 2020-12-09 MED ORDER — PROPOFOL 500 MG/50ML IV EMUL
INTRAVENOUS | Status: DC | PRN
  Administered 2020-12-09: 75 ug/kg/min via INTRAVENOUS

## 2020-12-09 MED ORDER — OXYCODONE HCL 5 MG/5ML PO SOLN: 5.0000 mg | Freq: Once | ORAL | Status: AC | PRN

## 2020-12-09 MED ORDER — DEXAMETHASONE SODIUM PHOSPHATE 10 MG/ML IJ SOLN
10.0000 mg | Freq: Once | INTRAMUSCULAR | Status: AC
  Administered 2020-12-10: 10 mg via INTRAVENOUS
  Filled 2020-12-09: qty 1

## 2020-12-09 MED ORDER — ONDANSETRON HCL 4 MG/2ML IJ SOLN: 4.0000 mg | Freq: Once | INTRAMUSCULAR | Status: DC | PRN

## 2020-12-09 MED ORDER — AMLODIPINE BESYLATE 10 MG PO TABS
10.0000 mg | ORAL_TABLET | Freq: Every day | ORAL | Status: DC
  Administered 2020-12-10: 10 mg via ORAL
  Filled 2020-12-09: qty 1

## 2020-12-09 MED ORDER — PHENOL 1.4 % MT LIQD: 1.0000 | OROMUCOSAL | Status: DC | PRN

## 2020-12-09 MED ORDER — SODIUM CHLORIDE 0.9 % IR SOLN
Status: DC | PRN
  Administered 2020-12-09: 1000 mL

## 2020-12-09 MED ORDER — METHOCARBAMOL 500 MG IVPB - SIMPLE MED
500.0000 mg | Freq: Four times a day (QID) | INTRAVENOUS | Status: DC | PRN
  Administered 2020-12-09: 500 mg via INTRAVENOUS
  Filled 2020-12-09: qty 50

## 2020-12-09 MED ORDER — DEXAMETHASONE SODIUM PHOSPHATE 10 MG/ML IJ SOLN
8.0000 mg | Freq: Once | INTRAMUSCULAR | Status: AC
  Administered 2020-12-09: 8 mg via INTRAVENOUS

## 2020-12-09 MED ORDER — ACETAMINOPHEN 10 MG/ML IV SOLN: 1000.0000 mg | Freq: Once | INTRAVENOUS | Status: DC | PRN

## 2020-12-09 MED ORDER — ORAL CARE MOUTH RINSE: 15.0000 mL | Freq: Once | OROMUCOSAL | Status: AC

## 2020-12-09 MED ORDER — SODIUM CHLORIDE (PF) 0.9 % IJ SOLN
INTRAMUSCULAR | Status: AC
  Filled 2020-12-09: qty 10

## 2020-12-09 MED ORDER — MENTHOL 3 MG MT LOZG: 1.0000 | LOZENGE | OROMUCOSAL | Status: DC | PRN

## 2020-12-09 MED ORDER — BUPIVACAINE IN DEXTROSE 0.75-8.25 % IT SOLN
INTRATHECAL | Status: DC | PRN
  Administered 2020-12-09: 1.6 mL via INTRATHECAL

## 2020-12-09 MED ORDER — DEXAMETHASONE SODIUM PHOSPHATE 10 MG/ML IJ SOLN
INTRAMUSCULAR | Status: AC
  Filled 2020-12-09: qty 1

## 2020-12-09 MED ORDER — BISACODYL 10 MG RE SUPP: 10.0000 mg | Freq: Every day | RECTAL | Status: DC | PRN

## 2020-12-09 MED ORDER — CHLORHEXIDINE GLUCONATE 0.12 % MT SOLN
15.0000 mL | Freq: Once | OROMUCOSAL | Status: AC
  Administered 2020-12-09: 15 mL via OROMUCOSAL

## 2020-12-09 MED ORDER — BUPIVACAINE LIPOSOME 1.3 % IJ SUSP
INTRAMUSCULAR | Status: DC | PRN
  Administered 2020-12-09: 20 mL

## 2020-12-09 MED ORDER — DIPHENHYDRAMINE HCL 12.5 MG/5ML PO ELIX: 12.5000 mg | ORAL_SOLUTION | ORAL | Status: DC | PRN

## 2020-12-09 MED ORDER — STERILE WATER FOR IRRIGATION IR SOLN
Status: DC | PRN
  Administered 2020-12-09: 2000 mL

## 2020-12-09 MED ORDER — METOCLOPRAMIDE HCL 5 MG PO TABS: 5.0000 mg | ORAL_TABLET | Freq: Three times a day (TID) | ORAL | Status: DC | PRN

## 2020-12-09 MED ORDER — ASPIRIN EC 325 MG PO TBEC
325.0000 mg | DELAYED_RELEASE_TABLET | Freq: Two times a day (BID) | ORAL | Status: DC
  Administered 2020-12-10: 325 mg via ORAL
  Filled 2020-12-09: qty 1

## 2020-12-09 MED ORDER — POLYETHYLENE GLYCOL 3350 17 G PO PACK
17.0000 g | PACK | Freq: Every day | ORAL | Status: DC | PRN
Start: 2020-12-09 — End: 2020-12-10

## 2020-12-09 MED ORDER — OXYCODONE HCL 5 MG PO TABS
5.0000 mg | ORAL_TABLET | Freq: Once | ORAL | Status: AC | PRN
  Administered 2020-12-09: 5 mg via ORAL

## 2020-12-09 MED ORDER — FENTANYL CITRATE (PF) 100 MCG/2ML IJ SOLN
50.0000 ug | INTRAMUSCULAR | Status: DC
  Administered 2020-12-09: 50 ug via INTRAVENOUS
  Filled 2020-12-09: qty 2

## 2020-12-09 MED ORDER — ONDANSETRON HCL 4 MG/2ML IJ SOLN
INTRAMUSCULAR | Status: DC | PRN
  Administered 2020-12-09: 4 mg via INTRAVENOUS

## 2020-12-09 MED ORDER — SODIUM CHLORIDE (PF) 0.9 % IJ SOLN
INTRAMUSCULAR | Status: DC | PRN
  Administered 2020-12-09: 60 mL

## 2020-12-09 MED ORDER — ALBUTEROL SULFATE HFA 108 (90 BASE) MCG/ACT IN AERS: 2.0000 | INHALATION_SPRAY | Freq: Four times a day (QID) | RESPIRATORY_TRACT | Status: DC | PRN

## 2020-12-09 MED ORDER — MOMETASONE FURO-FORMOTEROL FUM 200-5 MCG/ACT IN AERO
2.0000 | INHALATION_SPRAY | Freq: Two times a day (BID) | RESPIRATORY_TRACT | Status: DC
  Administered 2020-12-09 – 2020-12-10 (×2): 2 via RESPIRATORY_TRACT
  Filled 2020-12-09: qty 8.8

## 2020-12-09 MED ORDER — METOCLOPRAMIDE HCL 5 MG/ML IJ SOLN: 5.0000 mg | Freq: Three times a day (TID) | INTRAMUSCULAR | Status: DC | PRN

## 2020-12-09 MED ORDER — METOPROLOL SUCCINATE ER 50 MG PO TB24
50.0000 mg | ORAL_TABLET | Freq: Every day | ORAL | Status: DC
  Administered 2020-12-10: 50 mg via ORAL
  Filled 2020-12-09: qty 1

## 2020-12-09 MED ORDER — PHENYLEPHRINE 40 MCG/ML (10ML) SYRINGE FOR IV PUSH (FOR BLOOD PRESSURE SUPPORT)
PREFILLED_SYRINGE | INTRAVENOUS | Status: DC | PRN
  Administered 2020-12-09: 40 ug via INTRAVENOUS
  Administered 2020-12-09: 80 ug via INTRAVENOUS

## 2020-12-09 MED ORDER — MIDAZOLAM HCL 2 MG/2ML IJ SOLN
1.0000 mg | INTRAMUSCULAR | Status: DC
  Administered 2020-12-09: 2 mg via INTRAVENOUS
  Filled 2020-12-09: qty 2

## 2020-12-09 MED ORDER — PROPOFOL 1000 MG/100ML IV EMUL
INTRAVENOUS | Status: AC
  Filled 2020-12-09: qty 100

## 2020-12-09 MED ORDER — CEFAZOLIN SODIUM-DEXTROSE 2-4 GM/100ML-% IV SOLN
2.0000 g | INTRAVENOUS | Status: AC
  Administered 2020-12-09: 2 g via INTRAVENOUS
  Filled 2020-12-09: qty 100

## 2020-12-09 SURGICAL SUPPLY — 55 items
BAG SPEC THK2 15X12 ZIP CLS (MISCELLANEOUS) ×1
BAG ZIPLOCK 12X15 (MISCELLANEOUS) ×2 IMPLANT
BLADE SAG 18X100X1.27 (BLADE) ×2 IMPLANT
BLADE SAW SGTL 11.0X1.19X90.0M (BLADE) ×2 IMPLANT
BNDG ELASTIC 6X5.8 VLCR STR LF (GAUZE/BANDAGES/DRESSINGS) ×2 IMPLANT
BOWL SMART MIX CTS (DISPOSABLE) ×2 IMPLANT
CEMENT HV SMART SET (Cement) ×3 IMPLANT
CEMENT TIBIA MBT SIZE 2 (Knees) IMPLANT
COVER SURGICAL LIGHT HANDLE (MISCELLANEOUS) ×2 IMPLANT
COVER WAND RF STERILE (DRAPES) IMPLANT
CUFF TOURN SGL QUICK 34 (TOURNIQUET CUFF) ×2
CUFF TRNQT CYL 34X4.125X (TOURNIQUET CUFF) ×1 IMPLANT
DECANTER SPIKE VIAL GLASS SM (MISCELLANEOUS) ×2 IMPLANT
DRAPE U-SHAPE 47X51 STRL (DRAPES) ×2 IMPLANT
DRESSING AQUACEL AG SP 3.5X10 (GAUZE/BANDAGES/DRESSINGS) IMPLANT
DRSG AQUACEL AG ADV 3.5X10 (GAUZE/BANDAGES/DRESSINGS) ×2 IMPLANT
DRSG AQUACEL AG SP 3.5X10 (GAUZE/BANDAGES/DRESSINGS) ×2
DURAPREP 26ML APPLICATOR (WOUND CARE) ×2 IMPLANT
ELECT REM PT RETURN 15FT ADLT (MISCELLANEOUS) ×2 IMPLANT
FEMUR SIGMA PS SZ 2.0 R (Femur) ×1 IMPLANT
GLOVE BIOGEL M 8.0 STRL (GLOVE) ×1 IMPLANT
GLOVE SRG 8 PF TXTR STRL LF DI (GLOVE) ×1 IMPLANT
GLOVE SURG ENC MOIS LTX SZ6 (GLOVE) IMPLANT
GLOVE SURG ENC MOIS LTX SZ7 (GLOVE) ×2 IMPLANT
GLOVE SURG ENC MOIS LTX SZ8 (GLOVE) ×2 IMPLANT
GLOVE SURG UNDER POLY LF SZ6.5 (GLOVE) ×2 IMPLANT
GLOVE SURG UNDER POLY LF SZ8 (GLOVE) ×4
GLOVE SURG UNDER POLY LF SZ8.5 (GLOVE) ×2 IMPLANT
GOWN STRL REUS W/TWL LRG LVL3 (GOWN DISPOSABLE) ×4 IMPLANT
GOWN STRL REUS W/TWL XL LVL3 (GOWN DISPOSABLE) ×2 IMPLANT
HANDPIECE INTERPULSE COAX TIP (DISPOSABLE) ×2
HOLDER FOLEY CATH W/STRAP (MISCELLANEOUS) ×1 IMPLANT
IMMOBILIZER KNEE 20 (SOFTGOODS) ×2
IMMOBILIZER KNEE 20 THIGH 36 (SOFTGOODS) ×1 IMPLANT
INSERT PFC SIG STB SZ2 15.0MM (Knees) ×1 IMPLANT
KIT TURNOVER KIT A (KITS) ×2 IMPLANT
MANIFOLD NEPTUNE II (INSTRUMENTS) ×2 IMPLANT
NS IRRIG 1000ML POUR BTL (IV SOLUTION) ×2 IMPLANT
PACK TOTAL KNEE CUSTOM (KITS) ×2 IMPLANT
PADDING CAST COTTON 6X4 STRL (CAST SUPPLIES) ×3 IMPLANT
PATELLA DOME PFC 35MM (Knees) ×1 IMPLANT
PENCIL SMOKE EVACUATOR (MISCELLANEOUS) ×2 IMPLANT
PIN STEINMAN FIXATION KNEE (PIN) ×1 IMPLANT
PROTECTOR NERVE ULNAR (MISCELLANEOUS) ×2 IMPLANT
SET HNDPC FAN SPRY TIP SCT (DISPOSABLE) ×1 IMPLANT
STRIP CLOSURE SKIN 1/2X4 (GAUZE/BANDAGES/DRESSINGS) ×4 IMPLANT
SUT MNCRL AB 4-0 PS2 18 (SUTURE) ×2 IMPLANT
SUT STRATAFIX 0 PDS 27 VIOLET (SUTURE) ×2
SUT VIC AB 2-0 CT1 27 (SUTURE) ×6
SUT VIC AB 2-0 CT1 TAPERPNT 27 (SUTURE) ×3 IMPLANT
SUTURE STRATFX 0 PDS 27 VIOLET (SUTURE) ×1 IMPLANT
TIBIA MBT CEMENT SIZE 2 (Knees) ×2 IMPLANT
TRAY FOLEY MTR SLVR 14FR STAT (SET/KITS/TRAYS/PACK) ×1 IMPLANT
WATER STERILE IRR 1000ML POUR (IV SOLUTION) ×4 IMPLANT
WRAP KNEE MAXI GEL POST OP (GAUZE/BANDAGES/DRESSINGS) ×2 IMPLANT

## 2020-12-09 NOTE — Transfer of Care (Signed)
Immediate Anesthesia Transfer of Care Note  Patient: [name]  Procedure(s) Performed: TOTAL KNEE ARTHROPLASTY (Right Knee)  Patient Location: PACU  Anesthesia Type:Spinal  Level of Consciousness: awake, alert  and oriented  Airway & Oxygen Therapy: Patient Spontanous Breathing and Patient connected to face mask oxygen  Post-op Assessment: Report given to RN and Post -op Vital signs reviewed and stable  Post vital signs: Reviewed and stable  Last Vitals:  Vitals Value Taken Time  BP 107/66 12/09/20 1400  Temp    Pulse 73 12/09/20 1401  Resp 21 12/09/20 1401  SpO2 97 % 12/09/20 1401  Vitals shown include unvalidated device data.  Last Pain:  Vitals:   12/09/20 1027  TempSrc:   PainSc: 0-No pain         Complications: No complications documented.

## 2020-12-09 NOTE — Op Note (Signed)
OPERATIVE REPORT-TOTAL KNEE ARTHROPLASTY   Pre-operative diagnosis- Osteoarthritis  Right knee(s)  Post-operative diagnosis- Osteoarthritis Right knee(s)  Procedure-  Right  Total Knee Arthroplasty  Surgeon- Gus Rankin. Haziel Molner, MD  Assistant- Leilani Able, PA-C   Anesthesia-  Adductor canal block and spinal  EBL-50 mL   Drains None  Tourniquet time-  Total Tourniquet Time Documented: Thigh (Right) - 34 minutes Total: Thigh (Right) - 34 minutes     Complications- None  Condition-PACU - hemodynamically stable.   Brief Clinical Note  Kristy [name] is a 80 y.o. year old female with end stage OA of her right knee with progressively worsening pain and dysfunction. She has constant pain, with activity and at rest and significant functional deficits with difficulties even with ADLs. She has had extensive non-op management including analgesics, injections of cortisone and viscosupplements, and home exercise program, but remains in significant pain with significant dysfunction.Radiographs show bone on bone arthritis medial and patelloefemoral. She presents now for right Total Knee Arthroplasty.    Procedure in detail---   The patient is brought into the operating room and positioned supine on the operating table. After successful administration of  Adductor canal block and spina,   a tourniquet is placed high on the  Right thigh(s) and the lower extremity is prepped and draped in the usual sterile fashion. Time out is performed by the operating team and then the  Right lower extremity is wrapped in Esmarch, knee flexed and the tourniquet inflated to 300 mmHg.       A midline incision is made with a ten blade through the subcutaneous tissue to the level of the extensor mechanism. A fresh blade is used to make a medial parapatellar arthrotomy. Soft tissue over the proximal medial tibia is subperiosteally elevated to the joint line with a knife and into the semimembranosus bursa with a Cobb  elevator. Soft tissue over the proximal lateral tibia is elevated with attention being paid to avoiding the patellar tendon on the tibial tubercle. The patella is everted, knee flexed 90 degrees and the ACL and PCL are removed. Findings are bone on bone medial and patellofemoral with large global osteophytes.        The drill is used to create a starting hole in the distal femur and the canal is thoroughly irrigated with sterile saline to remove the fatty contents. The 5 degree Right  valgus alignment guide is placed into the femoral canal and the distal femoral cutting block is pinned to remove 10 mm off the distal femur. Resection is made with an oscillating saw.      The tibia is subluxed forward and the menisci are removed. The extramedullary alignment guide is placed referencing proximally at the medial aspect of the tibial tubercle and distally along the second metatarsal axis and tibial crest. The block is pinned to remove 40mm off the more deficient medial  side. Resection is made with an oscillating saw. Size 2is the most appropriate size for the tibia and the proximal tibia is prepared with the modular drill and keel punch for that size.      The femoral sizing guide is placed and size 2 is most appropriate. Rotation is marked off the epicondylar axis and confirmed by creating a rectangular flexion gap at 90 degrees. The size 2 cutting block is pinned in this rotation and the anterior, posterior and chamfer cuts are made with the oscillating saw. The intercondylar block is then placed and that cut is made.  Trial size 2 tibial component, trial size 2 posterior stabilized femur and a 15  mm posterior stabilized rotating platform insert trial is placed. Full extension is achieved with excellent varus/valgus and anterior/posterior balance throughout full range of motion. The patella is everted and thickness measured to be 21  mm. Free hand resection is taken to 12 mm, a 35 template is placed, lug holes  are drilled, trial patella is placed, and it tracks normally. Osteophytes are removed off the posterior femur with the trial in place. All trials are removed and the cut bone surfaces prepared with pulsatile lavage. Cement is mixed and once ready for implantation, the size 2 tibial implant, size  2 posterior stabilized femoral component, and the size 35 patella are cemented in place and the patella is held with the clamp. The trial insert is placed and the knee held in full extension. The Exparel (20 ml mixed with 60 ml saline) is injected into the extensor mechanism, posterior capsule, medial and lateral gutters and subcutaneous tissues.  All extruded cement is removed and once the cement is hard the permanent 15 mm posterior stabilized rotating platform insert is placed into the tibial tray.      The wound is copiously irrigated with saline solution and the extensor mechanism closed with # 0 Stratofix suture. The tourniquet is released for a total tourniquet time of 34  minutes. Flexion against gravity is 140 degrees and the patella tracks normally. Subcutaneous tissue is closed with 2.0 vicryl and subcuticular with running 4.0 Monocryl. The incision is cleaned and dried and steri-strips and a bulky sterile dressing are applied. The limb is placed into a knee immobilizer and the patient is awakened and transported to recovery in stable condition.      Please note that a surgical assistant was a medical necessity for this procedure in order to perform it in a safe and expeditious manner. Surgical assistant was necessary to retract the ligaments and vital neurovascular structures to prevent injury to them and also necessary for proper positioning of the limb to allow for anatomic placement of the prosthesis.   Gus Rankin Early Steel, MD    12/09/2020, 1:38 PM

## 2020-12-09 NOTE — Interval H&P Note (Signed)
History and Physical Interval Note:  12/09/2020 10:35 AM  Kristy [name]  has presented today for surgery, with the diagnosis of right knee osteoarthritis.  The various methods of treatment have been discussed with the patient and family. After consideration of risks, benefits and other options for treatment, the patient has consented to  Procedure(s) with comments: TOTAL KNEE ARTHROPLASTY (Right) - as a surgical intervention.  The patient's history has been reviewed, patient examined, no change in status, stable for surgery.  I have reviewed the patient's chart and labs.  Questions were answered to the patient's satisfaction.     Homero Fellers Daaiel Starlin

## 2020-12-09 NOTE — Anesthesia Postprocedure Evaluation (Signed)
Anesthesia Post Note  Patient: Kristy [name]  Procedure(s) Performed: TOTAL KNEE ARTHROPLASTY (Right Knee)     Patient location during evaluation: PACU Anesthesia Type: Regional, MAC and Spinal Level of consciousness: oriented and awake and alert Pain management: pain level controlled Vital Signs Assessment: post-procedure vital signs reviewed and stable Respiratory status: spontaneous breathing, respiratory function stable and patient connected to nasal cannula oxygen Cardiovascular status: blood pressure returned to baseline and stable Postop Assessment: no headache, no backache and no apparent nausea or vomiting Anesthetic complications: no   No complications documented.  Last Vitals:  Vitals:   12/09/20 1611 12/09/20 1708  BP: (!) 144/78 (!) 163/87  Pulse: 66 70  Resp: 18 18  Temp:    SpO2: 96% 96%    Last Pain:  Vitals:   12/09/20 1611  TempSrc: Oral  PainSc: 6                  Concetta Guion P Coriana Angello

## 2020-12-09 NOTE — Evaluation (Signed)
Physical Therapy Evaluation Patient Details Name: JOHNANNA BAKKE MRN: [id number] DOB: [birth year] Today's Date: 12/09/2020   History of Present Illness  patient is an 80 yo. female s/p Rt TKA on 12/09/2020 with PMH significant for HTN, GERD, OA, dysrhythmia ("skipping a  beat"), DM, asthma, Rt RTC tear, Lt RTCR (2016), ORIF Rt ankle (2019), and back surgery (ruptured disc).  Clinical Impression  Pt is an 79y.o. female s/p Rt TKA POD 0. Pt reports that she is modified independent with use of SPC for mobility at baseline. Pt required MIN guard and verbal cues for sit to stand transfers. Pt required MIN assist- MIN guard for ambulation 72ft with cues for RW management and step to gait pattern with no LOB or knee buckling. PT reviewed therapeutic intervention for promotion of DVT prevention, pt demonstrated understanding. Pt's children will be staying with her while recovering. Recommend home with family support. Pt will benefit from skilled PT to increase independence and safety with mobility. Acute therapy to follow up during stay.      Follow Up Recommendations Outpatient PT;Follow surgeon's recommendation for DC plan and follow-up therapies    Equipment Recommendations  None recommended by PT (pt owns RW)    Recommendations for Other Services       Precautions / Restrictions Precautions Precautions: Fall Restrictions Weight Bearing Restrictions: No Other Position/Activity Restrictions: WBAT      Mobility  Bed Mobility Overal bed mobility: Needs Assistance Bed Mobility: Supine to Sit     Supine to sit: Supervision;HOB elevated     General bed mobility comments: pt with use of B UEs to scoot to EOB with supervision for safety.    Transfers Overall transfer level: Needs assistance Equipment used: Rolling walker (2 wheeled) Transfers: Sit to/from Stand Sit to Stand: Min guard         General transfer comment: MIN guard for safety with cues for safe hand  placement.  Ambulation/Gait Ambulation/Gait assistance: Min assist;Min guard Gait Distance (Feet): 50 Feet Assistive device: Rolling walker (2 wheeled) Gait Pattern/deviations: Step-to pattern;Decreased stride length;Decreased weight shift to right Gait velocity: decr   General Gait Details: Pt performed pre gait marching with MIN guard and use of B UEs on RW with no knee buckling. MIN asisst progressing to MIN guard for safety with verbal and tactile cues for RW management and step to gait pattern with no LOB observed.  Stairs            Wheelchair Mobility    Modified Rankin (Stroke Patients Only)       Balance Overall balance assessment: Needs assistance Sitting-balance support: Feet supported Sitting balance-Leahy Scale: Good     Standing balance support: Bilateral upper extremity supported;During functional activity Standing balance-Leahy Scale: Poor Standing balance comment: use of RW to maintain standing balance                             Pertinent Vitals/Pain Pain Assessment: 0-10 Pain Score: 4  Pain Location: Rt knee Pain Descriptors / Indicators: Aching;Discomfort Pain Intervention(s): Limited activity within patient's tolerance;Monitored during session;Repositioned;Patient requesting pain meds-RN notified;Ice applied    Home Living Family/patient expects to be discharged to:: Private residence Living Arrangements: Alone Available Help at Discharge: Family Type of Home: House Home Access: Stairs to enter Entrance Stairs-Rails: Can reach both Entrance Stairs-Number of Steps: 3 Home Layout: Two level;Able to live on main level with bedroom/bathroom;Bed/bath upstairs Home Equipment: Walker - 2 wheels;Cane - single  point Additional Comments: pt is able to stay in guest room on main level at home while recovering. Pt will have 24/7 assist from children upon discharge.    Prior Function Level of Independence: Independent with assistive device(s)          Comments: use of SPC     Hand Dominance   Dominant Hand: Right    Extremity/Trunk Assessment   Upper Extremity Assessment Upper Extremity Assessment: Overall WFL for tasks assessed    Lower Extremity Assessment Lower Extremity Assessment: RLE deficits/detail RLE Deficits / Details: pt with good quad set strength and 4/5 B dorsi/plantar flexion strength. Pt able to complete full SLR with no extensor lag. RLE Sensation: WNL RLE Coordination: WNL    Cervical / Trunk Assessment Cervical / Trunk Assessment: Normal  Communication   Communication: No difficulties  Cognition Arousal/Alertness: Awake/alert Behavior During Therapy: WFL for tasks assessed/performed Overall Cognitive Status: Within Functional Limits for tasks assessed                                        General Comments      Exercises Total Joint Exercises Ankle Circles/Pumps: AROM;Both;15 reps;Seated   Assessment/Plan    PT Assessment Patient needs continued PT services  PT Problem List Decreased strength;Decreased range of motion;Decreased activity tolerance;Decreased balance;Decreased mobility;Decreased knowledge of use of DME;Pain       PT Treatment Interventions DME instruction;Gait training;Stair training;Functional mobility training;Therapeutic activities;Therapeutic exercise;Balance training;Patient/family education    PT Goals (Current goals can be found in the Care Plan section)  Acute Rehab PT Goals Patient Stated Goal: get back to dancing classes and walking her dog PT Goal Formulation: With patient/family Time For Goal Achievement: 12/16/20 Potential to Achieve Goals: Good    Frequency 7X/week   Barriers to discharge        Co-evaluation               AM-PAC PT "6 Clicks" Mobility  Outcome Measure Help needed turning from your back to your side while in a flat bed without using bedrails?: None Help needed moving from lying on your back to sitting  on the side of a flat bed without using bedrails?: None Help needed moving to and from a bed to a chair (including a wheelchair)?: A Little Help needed standing up from a chair using your arms (e.g., wheelchair or bedside chair)?: A Little Help needed to walk in hospital room?: A Little Help needed climbing 3-5 steps with a railing? : A Lot 6 Click Score: 19    End of Session Equipment Utilized During Treatment: Gait belt Activity Tolerance: Patient tolerated treatment well Patient left: in chair;with call bell/phone within reach;with chair alarm set;with family/visitor present Nurse Communication: Mobility status PT Visit Diagnosis: Unsteadiness on feet (R26.81);Muscle weakness (generalized) (M62.81);Pain Pain - Right/Left: Right Pain - part of body: Knee    Time: 6629-4765 PT Time Calculation (min) (ACUTE ONLY): 25 min   Charges:             Loyal Gambler, SPT  Acute rehab    Loyal Gambler 12/09/2020, 6:58 PM

## 2020-12-09 NOTE — Discharge Instructions (Signed)
 Kristy Aluisio, MD Total Joint Specialist [facility] [place] [address] [place], [place] [address] [phone]  TOTAL KNEE REPLACEMENT POSTOPERATIVE DIRECTIONS    Knee Rehabilitation, Guidelines Following Surgery  Results after knee surgery are often greatly improved when you follow the exercise, range of motion and muscle strengthening exercises prescribed by your doctor. Safety measures are also important to protect the knee from further injury. If any of these exercises cause you to have increased pain or swelling in your knee joint, decrease the amount until you are comfortable again and slowly increase them. If you have problems or questions, call your caregiver or physical therapist for advice.   BLOOD CLOT PREVENTION . Take a 325 mg Aspirin two times a day for three weeks following surgery. Then take an 81 mg Aspirin once a day for three weeks. Then discontinue Aspirin. . You may resume your vitamins/supplements upon discharge from the hospital. . Do not take any NSAIDs (Advil, Aleve, Ibuprofen, Meloxicam, etc.) until you have discontinued the 325 mg Aspirin.  HOME CARE INSTRUCTIONS  . Remove items at home which could result in a fall. This includes throw rugs or furniture in walking pathways.  . ICE to the affected knee as much as tolerated. Icing helps control swelling. If the swelling is well controlled you will be more comfortable and rehab easier. Continue to use ice on the knee for pain and swelling from surgery. You may notice swelling that will progress down to the foot and ankle. This is normal after surgery. Elevate the leg when you are not up walking on it.    . Continue to use the breathing machine which will help keep your temperature down. It is common for your temperature to cycle up and down following surgery, especially at night when you are not up moving around and exerting yourself. The breathing machine keeps your lungs expanded and your  temperature down. . Do not place pillow under the operative knee, focus on keeping the knee straight while resting  DIET You may resume your previous home diet once you are discharged from the hospital.  DRESSING / WOUND CARE / SHOWERING . Keep your bulky bandage on for 2 days. On the third post-operative day you may remove the Ace bandage and gauze. There is a waterproof adhesive bandage on your skin which will stay in place until your first follow-up appointment. Once you remove this you will not need to place another bandage . You may begin showering 3 days following surgery, but do not submerge the incision under water.  ACTIVITY For the first 5 days, the key is rest and control of pain and swelling . Do your home exercises twice a day starting on post-operative day 3. On the days you go to physical therapy, just do the home exercises once that day. . You should rest, ice and elevate the leg for 50 minutes out of every hour. Get up and walk/stretch for 10 minutes per hour. After 5 days you can increase your activity slowly as tolerated. . Walk with your walker as instructed. Use the walker until you are comfortable transitioning to a cane. Walk with the cane in the opposite hand of the operative leg. You may discontinue the cane once you are comfortable and walking steadily. . Avoid periods of inactivity such as sitting longer than an hour when not asleep. This helps prevent blood clots.  . You may discontinue the knee immobilizer once you are able to perform a straight   leg raise while lying down. . You may resume a sexual relationship in one month or when given the OK by your doctor.  . You may return to work once you are cleared by your doctor.  . Do not drive a car for 6 weeks or until released by your surgeon.  . Do not drive while taking narcotics.  TED HOSE STOCKINGS Wear the elastic stockings on both legs for three weeks following surgery during the day. You may remove them at night  for sleeping.  WEIGHT BEARING Weight bearing as tolerated with assist device (walker, cane, etc) as directed, use it as long as suggested by your surgeon or therapist, typically at least 4-6 weeks.  POSTOPERATIVE CONSTIPATION PROTOCOL Constipation - defined medically as fewer than three stools per week and severe constipation as less than one stool per week.  One of the most common issues patients have following surgery is constipation.  Even if you have a regular bowel pattern at home, your normal regimen is likely to be disrupted due to multiple reasons following surgery.  Combination of anesthesia, postoperative narcotics, change in appetite and fluid intake all can affect your bowels.  In order to avoid complications following surgery, here are some recommendations in order to help you during your recovery period.  . Colace (docusate) - Pick up an over-the-counter form of Colace or another stool softener and take twice a day as long as you are requiring postoperative pain medications.  Take with a full glass of water daily.  If you experience loose stools or diarrhea, hold the colace until you stool forms back up. If your symptoms do not get better within 1 week or if they get worse, check with your doctor. . Dulcolax (bisacodyl) - Pick up over-the-counter and take as directed by the product packaging as needed to assist with the movement of your bowels.  Take with a full glass of water.  Use this product as needed if not relieved by Colace only.  . MiraLax (polyethylene glycol) - Pick up over-the-counter to have on hand. MiraLax is a solution that will increase the amount of water in your bowels to assist with bowel movements.  Take as directed and can mix with a glass of water, juice, soda, coffee, or tea. Take if you go more than two days without a movement. Do not use MiraLax more than once per day. Call your doctor if you are still constipated or irregular after using this medication for 7 days  in a row.  If you continue to have problems with postoperative constipation, please contact the office for further assistance and recommendations.  If you experience "the worst abdominal pain ever" or develop nausea or vomiting, please contact the office immediatly for further recommendations for treatment.  ITCHING If you experience itching with your medications, try taking only a single pain pill, or even half a pain pill at a time.  You can also use Benadryl over the counter for itching or also to help with sleep.   MEDICATIONS See your medication summary on the "After Visit Summary" that the nursing staff will review with you prior to discharge.  You may have some home medications which will be placed on hold until you complete the course of blood thinner medication.  It is important for you to complete the blood thinner medication as prescribed by your surgeon.  Continue your approved medications as instructed at time of discharge.  PRECAUTIONS . If you experience chest pain or shortness of   breath - call 911 immediately for transfer to the hospital emergency department.  . If you develop a fever greater that 101 F, purulent drainage from wound, increased redness or drainage from wound, foul odor from the wound/dressing, or calf pain - CONTACT YOUR SURGEON.                                                   FOLLOW-UP APPOINTMENTS Make sure you keep all of your appointments after your operation with your surgeon and caregivers. You should call the office at the above phone number and make an appointment for approximately two weeks after the date of your surgery or on the date instructed by your surgeon outlined in the "After Visit Summary".  RANGE OF MOTION AND STRENGTHENING EXERCISES  Rehabilitation of the knee is important following a knee injury or an operation. After just a few days of immobilization, the muscles of the thigh which control the knee become weakened and shrink (atrophy). Knee  exercises are designed to build up the tone and strength of the thigh muscles and to improve knee motion. Often times heat used for twenty to thirty minutes before working out will loosen up your tissues and help with improving the range of motion but do not use heat for the first two weeks following surgery. These exercises can be done on a training (exercise) mat, on the floor, on a table or on a bed. Use what ever works the best and is most comfortable for you Knee exercises include:  . Leg Lifts - While your knee is still immobilized in a splint or cast, you can do straight leg raises. Lift the leg to 60 degrees, hold for 3 sec, and slowly lower the leg. Repeat 10-20 times 2-3 times daily. Perform this exercise against resistance later as your knee gets better.  . Quad and Hamstring Sets - Tighten up the muscle on the front of the thigh (Quad) and hold for 5-10 sec. Repeat this 10-20 times hourly. Hamstring sets are done by pushing the foot backward against an object and holding for 5-10 sec. Repeat as with quad sets.   Leg Slides: Lying on your back, slowly slide your foot toward your buttocks, bending your knee up off the floor (only go as far as is comfortable). Then slowly slide your foot back down until your leg is flat on the floor again.  Angel Wings: Lying on your back spread your legs to the side as far apart as you can without causing discomfort.  A rehabilitation program following serious knee injuries can speed recovery and prevent re-injury in the future due to weakened muscles. Contact your doctor or a physical therapist for more information on knee rehabilitation.   IF YOU ARE TRANSFERRED TO A SKILLED REHAB FACILITY If the patient is transferred to a skilled rehab facility following release from the hospital, a list of the current medications will be sent to the facility for the patient to continue.  When discharged from the skilled rehab facility, please have the facility set up the  patient's Home Health Physical Therapy prior to being released. Also, the skilled facility will be responsible for providing the patient with their medications at time of release from the facility to include their pain medication, the muscle relaxants, and their blood thinner medication. If the patient is still at the   rehab facility at time of the two week follow up appointment, the skilled rehab facility will also need to assist the patient in arranging follow up appointment in our office and any transportation needs.  MAKE SURE YOU:  . Understand these instructions.  . Get help right away if you are not doing well or get worse.   DENTAL ANTIBIOTICS:  In most cases prophylactic antibiotics for Dental procdeures after total joint surgery are not necessary.  Exceptions are as follows:  1. History of prior total joint infection  2. Severely immunocompromised (Organ Transplant, cancer chemotherapy, Rheumatoid biologic meds such as Humera)  3. Poorly controlled diabetes (A1C &gt; 8.0, blood glucose over 200)  If you have one of these conditions, contact your surgeon for an antibiotic prescription, prior to your dental procedure.    Pick up stool softner and laxative for home use following surgery while on pain medications. Do not submerge incision under water. Please use good hand washing techniques while changing dressing each day. May shower starting three days after surgery. Please use a clean towel to pat the incision dry following showers. Continue to use ice for pain and swelling after surgery. Do not use any lotions or creams on the incision until instructed by your surgeon.  

## 2020-12-09 NOTE — Care Plan (Signed)
Ortho Bundle Case Management Note  Patient Details  Name: Kristy [name] MRN: [id number] Date of Birth: Jan 10, 1941  R TKA on 12-09-20 DCP:  Home with son.  2 story home with 3 ste. DME:  No needs.  Has a RW and 3-in-1. PT: [facility].  PT eval scheduled on 12-12-20.                   DME Arranged:  N/A DME Agency:  NA  HH Arranged:  NA HH Agency:  NA  Additional Comments: Please contact me with any questions of if this plan should need to change.  Ennis Forts, RN,CCM [facility]  [phone] 12/09/2020, 12:39 PM

## 2020-12-09 NOTE — Progress Notes (Signed)
AssistedDr. Greg Stoltzfus with right, ultrasound guided, adductor canal block. Side rails up, monitors on throughout procedure. See vital signs in flow sheet. Tolerated Procedure well.  

## 2020-12-09 NOTE — Anesthesia Procedure Notes (Signed)
Spinal  Patient location during procedure: OR End time: 12/09/2020 12:36 PM Staffing Performed: resident/CRNA  Resident/CRNA: Maxwell Caul, CRNA Preanesthetic Checklist Completed: patient identified, IV checked, site marked, risks and benefits discussed, surgical consent, monitors and equipment checked, pre-op evaluation and timeout performed Spinal Block Patient position: sitting Prep: DuraPrep Patient monitoring: heart rate, cardiac monitor, continuous pulse ox and blood pressure Approach: midline Location: L3-4 Injection technique: single-shot Needle Needle type: Pencan  Needle gauge: 24 G Needle length: 10 cm Assessment Sensory level: T4 Additional Notes IV functioning, monitors applied to pt. Expiration date of kit checked and confirmed to be in date. Sterile prep and drape, hand hygiene and sterile gloved used. Pt was positioned and spine was prepped in sterile fashion. Skin was anesthetized with lidocaine. Free flow of clear CSF obtained prior to injecting local anesthetic into CSF x 1 attempt. Spinal needle aspirated freely following injection. Needle was carefully withdrawn, and pt tolerated procedure well. Loss of motor and sensory on exam post injection. Dr Gloris Manchester at bedside during entire placement.

## 2020-12-09 NOTE — Anesthesia Procedure Notes (Signed)
Anesthesia Regional Block: Adductor canal block   Pre-Anesthetic Checklist: ,, timeout performed, Correct Patient, Correct Site, Correct Laterality, Correct Procedure, Correct Position, site marked, Risks and benefits discussed,  Surgical consent,  Pre-op evaluation,  At surgeon's request and post-op pain management  Laterality: Right  Prep: Dura Prep       Needles:  Injection technique: Single-shot  Needle Type: Echogenic Stimulator Needle     Needle Length: 9cm  Needle Gauge: 20     Additional Needles:   Procedures:,,,, ultrasound used (permanent image in chart),,,,  Narrative:  Start time: 12/09/2020 12:00 PM End time: 12/09/2020 12:03 PM Injection made incrementally with aspirations every 5 mL.  Performed by: Personally  Anesthesiologist: Atilano Median, DO  Additional Notes: Patient identified. Risks/Benefits/Options discussed with patient including but not limited to bleeding, infection, nerve damage, failed block, incomplete pain control. Patient expressed understanding and wished to proceed. All questions were answered. Sterile technique was used throughout the entire procedure. Please see nursing notes for vital signs. Aspirated in 5cc intervals with injection for negative confirmation. Patient was given instructions on fall risk and not to get out of bed. All questions and concerns addressed with instructions to call with any issues or inadequate analgesia.

## 2020-12-09 NOTE — Anesthesia Procedure Notes (Signed)
Procedure Name: MAC Date/Time: 12/09/2020 12:29 PM Performed by: Maxwell Caul, CRNA Pre-anesthesia Checklist: Patient identified, Emergency Drugs available, Suction available and Patient being monitored Oxygen Delivery Method: Simple face mask

## 2020-12-10 ENCOUNTER — Encounter (HOSPITAL_COMMUNITY): Payer: Self-pay | Admitting: Orthopedic Surgery

## 2020-12-10 DIAGNOSIS — I10 Essential (primary) hypertension: Secondary | ICD-10-CM | POA: Diagnosis not present

## 2020-12-10 DIAGNOSIS — M1711 Unilateral primary osteoarthritis, right knee: Secondary | ICD-10-CM | POA: Diagnosis not present

## 2020-12-10 DIAGNOSIS — Z96652 Presence of left artificial knee joint: Secondary | ICD-10-CM | POA: Diagnosis not present

## 2020-12-10 DIAGNOSIS — J45909 Unspecified asthma, uncomplicated: Secondary | ICD-10-CM | POA: Diagnosis not present

## 2020-12-10 DIAGNOSIS — Z87891 Personal history of nicotine dependence: Secondary | ICD-10-CM | POA: Diagnosis not present

## 2020-12-10 DIAGNOSIS — E119 Type 2 diabetes mellitus without complications: Secondary | ICD-10-CM | POA: Diagnosis not present

## 2020-12-10 LAB — BASIC METABOLIC PANEL
Anion gap: 12 (ref 5–15)
BUN: 13 mg/dL (ref 8–23)
CO2: 27 mmol/L (ref 22–32)
Calcium: 9.1 mg/dL (ref 8.9–10.3)
Chloride: 101 mmol/L (ref 98–111)
Creatinine, Ser: 0.69 mg/dL (ref 0.44–1.00)
GFR, Estimated: >60 mL/min (ref >60)
Glucose, Bld: 201 mg/dL — ABNORMAL HIGH (ref 70–99)
Potassium: 3.5 mmol/L (ref 3.5–5.1)
Sodium: 140 mmol/L (ref 135–145)

## 2020-12-10 LAB — CBC
HCT: 36.9 % (ref 36.0–46.0)
Hemoglobin: 12.2 g/dL (ref 12.0–15.0)
MCH: 30.7 pg (ref 26.0–34.0)
MCHC: 33.1 g/dL (ref 30.0–36.0)
MCV: 92.7 fL (ref 80.0–100.0)
Platelets: 301 10*3/uL (ref 150–400)
RBC: 3.98 MIL/uL (ref 3.87–5.11)
RDW: 12.9 % (ref 11.5–15.5)
WBC: 11 10*3/uL — ABNORMAL HIGH (ref 4.0–10.5)
nRBC: 0 % (ref 0.0–0.2)

## 2020-12-10 LAB — GLUCOSE, CAPILLARY
Glucose-Capillary: 137 mg/dL — ABNORMAL HIGH (ref 70–99)
Glucose-Capillary: 139 mg/dL — ABNORMAL HIGH (ref 70–99)

## 2020-12-10 MED ORDER — ASPIRIN 325 MG PO TBEC: 325.0000 mg | DELAYED_RELEASE_TABLET | Freq: Two times a day (BID) | ORAL | 0 refills | Status: DC

## 2020-12-10 MED ORDER — ASPIRIN 325 MG PO TBEC: DELAYED_RELEASE_TABLET | ORAL | 0 refills | Status: DC

## 2020-12-10 MED ORDER — OXYCODONE HCL 5 MG PO TABS: 5.0000 mg | ORAL_TABLET | Freq: Four times a day (QID) | ORAL | 0 refills | Status: DC | PRN

## 2020-12-10 MED ORDER — METHOCARBAMOL 500 MG PO TABS: 500.0000 mg | ORAL_TABLET | Freq: Four times a day (QID) | ORAL | 0 refills | Status: DC | PRN

## 2020-12-10 NOTE — TOC Transition Note (Signed)
Transition of Care [facility]) - CM/SW Discharge Note   Patient Details  Name: Kristy [name] MRN: [id number] Date of Birth: [birth year]  [facility] CM/SW Contact:  Lennart Pall, LCSW Phone Number: 12/10/2020, 10:10 AM   Clinical Narrative:    Met briefly with this Ortho Bundle pt.  She confirms she has needed DME at home.  Plan for OPPT at [facility]. No further TOC needs.   Final next level of care: OP Rehab Barriers to Discharge: No Barriers Identified   Patient Goals and CMS Choice Patient states their goals for this hospitalization and ongoing recovery are:: go home      Discharge Placement                       Discharge Plan and Services                DME Arranged: N/A DME Agency: NA       HH Arranged: NA HH Agency: NA        Social Determinants of Health (SDOH) Interventions     Readmission Risk Interventions No flowsheet data found.

## 2020-12-10 NOTE — Progress Notes (Signed)
Subjective: 1 Day Post-Op Procedure(s) (LRB): TOTAL KNEE ARTHROPLASTY (Right) Patient reports pain as mild.   Patient seen in rounds by Dr. Lequita Halt. Patient is well, and has had no acute complaints or problems. Denies SOB, chest pain, or calf pain. No acute overnight events. Ambulated 50 feet with PT yesterday. Foley pulled this am.  We will continue therapy today.   Objective: Vital signs in last 24 hours: Temp:  [97.4 F (36.3 C)-97.7 F (36.5 C)] 97.5 F (36.4 C) (02/22 0639) Pulse Rate:  [64-88] 74 (02/22 0639) Resp:  [14-27] 16 (02/22 0639) BP: (107-169)/(66-99) 145/75 (02/22 0639) SpO2:  [94 %-100 %] 95 % (02/22 0809) Weight:  [71 kg] 71 kg (02/21 1611)  Intake/Output from previous day:  Intake/Output Summary (Last 24 hours) at 12/10/2020 0933 Last data filed at 12/10/2020 0800 Gross per 24 hour  Intake 3030.9 ml  Output 2850 ml  Net 180.9 ml     Intake/Output this shift: Total I/O In: 240 [P.O.:240] Out: -   Labs: Recent Labs    12/10/20 0319  HGB 12.2   Recent Labs    12/10/20 0319  WBC 11.0*  RBC 3.98  HCT 36.9  PLT 301   Recent Labs    12/10/20 0319  NA 140  K 3.5  CL 101  CO2 27  BUN 13  CREATININE 0.69  GLUCOSE 201*  CALCIUM 9.1   No results for input(s): LABPT, INR in the last 72 hours.  Exam: General - Patient is Alert, Appropriate and Oriented Extremity - Neurologically intact Neurovascular intact Sensation intact distally Intact pulses distally Dressing - dressing C/D/I Motor Function - intact, moving foot and toes well on exam.   Past Medical History:  Diagnosis Date  . Allergic rhinitis, seasonal   . Arthritis   . Asthma    bronchial with weather change  . 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 Dysrhythmia    "Skipping a beat"  . GERD (gastroesophageal reflux disease)   . History of colon polyps   . Hypertension   . Pneumonia   . Rotator cuff tear, right     Assessment/Plan: 1 Day Post-Op  Procedure(s) (LRB): TOTAL KNEE ARTHROPLASTY (Right) Principal Problem:   OA (osteoarthritis) of knee Active Problems:   Osteoarthritis of right knee  Estimated body mass index is 29.57 kg/m as calculated from the following:   Height as of this encounter: 5\' 1"  (1.549 m).   Weight as of this encounter: 71 kg. Advance diet Up with therapy   Patient's anticipated LOS is less than 2 midnights, meeting these requirements: - Lives within 1 hour of care - Has a competent adult at home to recover with post-op recover - NO history of  - Chronic pain requiring opiods  - Diabetes  - Coronary Artery Disease  - Heart failure  - Heart attack  - Stroke  - DVT/VTE  - Cardiac arrhythmia  - Respiratory Failure/COPD  - Renal failure  - Anemia  - Advanced Liver disease       DVT Prophylaxis - Aspirin and TED hose Weight bearing as tolerated. Two sessions of PT today -- will plan for discharge today if meeting goals.  Plan is to go Home after hospital stay.  The PDMP database was reviewed today prior to any opioid medications being prescribed to this patient.  Patient to follow up with Dr. in clinic in two weeks.  Lequita Halt, MBA, PA-C Orthopedic Surgery 12/10/2020, 9:33 AM

## 2020-12-10 NOTE — Progress Notes (Signed)
Physical Therapy Treatment Patient Details Name: Kristy [name] MRN: [id number] DOB: 09-16-1941 Today's Date: 12/10/2020    History of Present Illness patient is an 80 yo. female s/p Rt TKA on 12/09/2020 with PMH significant for HTN, GERD, OA, dysrhythmia ("skipping a  beat"), DM, asthma, Rt RTC tear, Lt RTCR (2016), ORIF Rt ankle (2019), and back surgery (ruptured disc).    PT Comments    POD # 1 am session Assisted OOB.  General bed mobility comments: demonstrated and instructed pt how to use a belt loop to self assist LE off/onto bed.  General transfer comment: one VC on safety with turns.  General Gait Details: increased time and one VC on proper upright posture.  Tolerated amb a functional distance. General stair comments: 50% VC's on proper sequencing and safety pt was able to navigate 2 steps with B rails.  Then returned to room to perform some TE's following HEP handout.  Instructed on proper tech, freq as well as use of ICE.   Addressed all mobility questions, discussed appropriate activity, educated on use of ICE.  Pt ready for D/C to home.   Follow Up Recommendations  Outpatient PT;Follow surgeon's recommendation for DC plan and follow-up therapies     Equipment Recommendations  None recommended by PT    Recommendations for Other Services       Precautions / Restrictions Precautions Precautions: Fall Precaution Comments: instructed no pillow under knee Restrictions Weight Bearing Restrictions: No RLE Weight Bearing: Weight bearing as tolerated    Mobility  Bed Mobility Overal bed mobility: Needs Assistance Bed Mobility: Supine to Sit     Supine to sit: Supervision;HOB elevated     General bed mobility comments: demonstrated and instructed pt how to use a belt loop to self assist LE off/onto bed    Transfers Overall transfer level: Needs assistance Equipment used: Rolling walker (2 wheeled) Transfers: Sit to/from Stand Sit to Stand: Supervision;Min guard          General transfer comment: one VC on safety with turns  Ambulation/Gait Ambulation/Gait assistance: Supervision;Min guard Gait Distance (Feet): 25 Feet Assistive device: Rolling walker (2 wheeled) Gait Pattern/deviations: Step-to pattern;Decreased stride length;Decreased weight shift to right Gait velocity: decreased   General Gait Details: increased time and one VC on proper upright posture.  Tolerated amb a functional distance.   Stairs Stairs: Yes Stairs assistance: Supervision;Min guard Stair Management: Two rails;Step to pattern;Forwards Number of Stairs: 2 General stair comments: 50% VC's on proper sequencing and safety pt was able to navigate 2 steps with B rails   Wheelchair Mobility    Modified Rankin (Stroke Patients Only)       Balance                                            Cognition Arousal/Alertness: Awake/alert Behavior During Therapy: WFL for tasks assessed/performed Overall Cognitive Status: Within Functional Limits for tasks assessed                                 General Comments: AxO x 3 very pleasant      Exercises   Total Knee Replacement TE's following HEP handout 10 reps B LE ankle pumps 05 reps towel squeezes 05 reps knee presses 05 reps heel slides  05 reps SAQ's 05 reps SLR's 05  reps ABD Educated on use of gait belt to assist with TE's Followed by ICE     General Comments        Pertinent Vitals/Pain Pain Assessment: 0-10 Pain Score: 6  Pain Location: Rt knee Pain Descriptors / Indicators: Aching;Discomfort Pain Intervention(s): Monitored during session;Premedicated before session;Repositioned;Ice applied    Home Living                      Prior Function            PT Goals (current goals can now be found in the care plan section) Progress towards PT goals: Progressing toward goals    Frequency           PT Plan Current plan remains appropriate     Co-evaluation              AM-PAC PT "6 Clicks" Mobility   Outcome Measure  Help needed turning from your back to your side while in a flat bed without using bedrails?: None Help needed moving from lying on your back to sitting on the side of a flat bed without using bedrails?: None Help needed moving to and from a bed to a chair (including a wheelchair)?: None Help needed standing up from a chair using your arms (e.g., wheelchair or bedside chair)?: A Little Help needed to walk in hospital room?: A Little Help needed climbing 3-5 steps with a railing? : A Little 6 Click Score: 21    End of Session Equipment Utilized During Treatment: Gait belt Activity Tolerance: Patient tolerated treatment well Patient left: in chair;with call bell/phone within reach;with chair alarm set;with family/visitor present Nurse Communication: Mobility status PT Visit Diagnosis: Unsteadiness on feet (R26.81);Muscle weakness (generalized) (M62.81);Pain Pain - Right/Left: Right Pain - part of body: Knee     Time: 1220-1248 PT Time Calculation (min) (ACUTE ONLY): 28 min  Charges:  $Gait Training: 8-22 mins $Therapeutic Exercise: 8-22 mins                     Felecia Shelling  PTA Acute  Rehabilitation Services Pager      [phone] Office      [phone]

## 2020-12-10 NOTE — Plan of Care (Signed)
Pt ready for DC home 

## 2020-12-12 DIAGNOSIS — M25561 Pain in right knee: Secondary | ICD-10-CM | POA: Diagnosis not present

## 2020-12-13 NOTE — Discharge Summary (Signed)
Physician Discharge Summary   Patient ID: Kristy [name] MRN: [id number] DOB/AGE: Dec 27, 1940 80 y.o.  Admit date: 12/09/2020 Discharge date: 12/10/2020  Primary Diagnosis: Osteoarthritis of right knee   Admission Diagnoses:  Past Medical History:  Diagnosis Date  . Allergic rhinitis, seasonal   . Arthritis   . Asthma    bronchial with weather change  . 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 Dysrhythmia    "Skipping a beat"  . GERD (gastroesophageal reflux disease)   . History of colon polyps   . Hypertension   . Pneumonia   . Rotator cuff tear, right    Discharge Diagnoses:   Principal Problem:   OA (osteoarthritis) of knee Active Problems:   Osteoarthritis of right knee  Estimated body mass index is 29.57 kg/m as calculated from the following:   Height as of this encounter: 5\' 1"  (1.549 m).   Weight as of this encounter: 71 kg.  Procedure:  Procedure(s) (LRB): TOTAL KNEE ARTHROPLASTY (Right)   Consults: None  HPI: Kristy [name] is a 80 y.o. year old female with end stage OA of her right knee with progressively worsening pain and dysfunction. She has constant pain, with activity and at rest and significant functional deficits with difficulties even with ADLs. She has had extensive non-op management including analgesics, injections of cortisone and viscosupplements, and home exercise program, but remains in significant pain with significant dysfunction.Radiographs show bone on bone arthritis medial and patelloefemoral.   Laboratory Data: Admission on 12/09/2020, Discharged on 12/10/2020  Component Date Value Ref Range Status  . Glucose-Capillary 12/09/2020 226* 70 - 99 mg/dL Final   Glucose reference range applies only to samples taken after fasting for at least 8 hours.  . Glucose-Capillary 12/09/2020 72  70 - 99 mg/dL Final   Glucose reference range applies only to samples taken after fasting for at least 8 hours.  . Glucose-Capillary 12/09/2020  168* 70 - 99 mg/dL Final   Glucose reference range applies only to samples taken after fasting for at least 8 hours.  . WBC 12/10/2020 11.0* 4.0 - 10.5 K/uL Final  . RBC 12/10/2020 3.98  3.87 - 5.11 MIL/uL Final  . Hemoglobin 12/10/2020 12.2  12.0 - 15.0 g/dL Final  . HCT 12/12/2020 36.9  36.0 - 46.0 % Final  . MCV 12/10/2020 92.7  80.0 - 100.0 fL Final  . MCH 12/10/2020 30.7  26.0 - 34.0 pg Final  . MCHC 12/10/2020 33.1  30.0 - 36.0 g/dL Final  . RDW 12/12/2020 12.9  11.5 - 15.5 % Final  . Platelets 12/10/2020 301  150 - 400 K/uL Final  . nRBC 12/10/2020 0.0  0.0 - 0.2 % Final   Performed at [facility], [address], [place], [place]  . Sodium 12/10/2020 140  135 - 145 mmol/L Final  . Potassium 12/10/2020 3.5  3.5 - 5.1 mmol/L Final  . Chloride 12/10/2020 101  98 - 111 mmol/L Final  . CO2 12/10/2020 27  22 - 32 mmol/L Final  . Glucose, Bld 12/10/2020 201* 70 - 99 mg/dL Final   Glucose reference range applies only to samples taken after fasting for at least 8 hours.  . BUN 12/10/2020 13  8 - 23 mg/dL Final  . Creatinine, Ser 12/10/2020 0.69  0.44 - 1.00 mg/dL Final  . Calcium 12/12/2020 9.1  8.9 - 10.3 mg/dL Final  . GFR, Estimated 12/10/2020 >60  >60 mL/min Final   Comment: (  NOTE) Calculated using the CKD-EPI Creatinine Equation (2021)   . Anion gap 12/10/2020 12  5 - 15 Final   Performed at [facility], [address], [place], [place] [address]  . Glucose-Capillary 12/09/2020 194* 70 - 99 mg/dL Final   Glucose reference range applies only to samples taken after fasting for at least 8 hours.  . Glucose-Capillary 12/10/2020 137* 70 - 99 mg/dL Final   Glucose reference range applies only to samples taken after fasting for at least 8 hours.  . Glucose-Capillary 12/10/2020 139* 70 - 99 mg/dL Final   Glucose reference range applies only to samples taken after fasting for at least 8 hours.  Hospital Outpatient Visit on 12/05/2020   Component Date Value Ref Range Status  . SARS Coronavirus 2 12/05/2020 NEGATIVE  NEGATIVE Final   Comment: (NOTE) SARS-CoV-2 target nucleic acids are NOT DETECTED.  The SARS-CoV-2 RNA is generally detectable in upper and lower respiratory specimens during the acute phase of infection. Negative results do not preclude SARS-CoV-2 infection, do not rule out co-infections with other pathogens, and should not be used as the sole basis for treatment or other patient management decisions. Negative results must be combined with clinical observations, patient history, and epidemiological information. The expected result is Negative.  Fact Sheet for Patients: HairSlick.no  Fact Sheet for Healthcare Providers: quierodirigir.com  This test is not yet approved or cleared by the [place] FDA and  has been authorized for detection and/or diagnosis of SARS-CoV-2 by FDA under an Emergency Use Authorization (EUA). This EUA will remain  in effect (meaning this test can be used) for the duration of the COVID-19 declaration under Se                          ction 564(b)(1) of the Act, 21 U.S.C. section 360bbb-3(b)(1), unless the authorization is terminated or revoked sooner.  Performed at [facility], [address] [place], [place] [address]   Office Visit on 11/28/2020  Component Date Value Ref Range Status  . Hemoglobin A1C 11/28/2020 5.6  4.0 - 5.6 % Final  Hospital Outpatient Visit on 11/27/2020  Component Date Value Ref Range Status  . Hgb A1c MFr Bld 11/27/2020 5.7* 4.8 - 5.6 % Final   Comment: (NOTE) Pre diabetes:          5.7%-6.4%  Diabetes:              >6.4%  Glycemic control for   <7.0% adults with diabetes   . Mean Plasma Glucose 11/27/2020 116.89  mg/dL Final   Performed at [facility], [address] [place], [place] [address]  . MRSA, PCR 11/27/2020 NEGATIVE  NEGATIVE Final  . Staphylococcus  aureus 11/27/2020 NEGATIVE  NEGATIVE Final   Comment: (NOTE) The Xpert SA Assay (FDA approved for NASAL specimens in patients 82 years of age and older), is one component of a comprehensive surveillance program. It is not intended to diagnose infection nor to guide or monitor treatment. Performed at [facility], [address], [place], [place] [address]   . WBC 11/27/2020 7.9  4.0 - 10.5 K/uL Final  . RBC 11/27/2020 4.18  3.87 - 5.11 MIL/uL Final  . Hemoglobin 11/27/2020 12.6  12.0 - 15.0 g/dL Final  . HCT 66/44/0347 39.3  36.0 - 46.0 % Final  . MCV 11/27/2020 94.0  80.0 - 100.0 fL Final  . MCH 11/27/2020 30.1  26.0 -  34.0 pg Final  . MCHC 11/27/2020 32.1  30.0 - 36.0 g/dL Final  . RDW 16/07/9603 13.2  11.5 - 15.5 % Final  . Platelets 11/27/2020 337  150 - 400 K/uL Final  . nRBC 11/27/2020 0.0  0.0 - 0.2 % Final   Performed at [facility], [address], [place], [place] [address]  . Sodium 11/27/2020 140  135 - 145 mmol/L Final  . Potassium 11/27/2020 3.4* 3.5 - 5.1 mmol/L Final  . Chloride 11/27/2020 100  98 - 111 mmol/L Final  . CO2 11/27/2020 27  22 - 32 mmol/L Final  . Glucose, Bld 11/27/2020 98  70 - 99 mg/dL Final   Glucose reference range applies only to samples taken after fasting for at least 8 hours.  . BUN 11/27/2020 19  8 - 23 mg/dL Final  . Creatinine, Ser 11/27/2020 0.65  0.44 - 1.00 mg/dL Final  . Calcium 11/91/4782 9.7  8.9 - 10.3 mg/dL Final  . Total Protein 11/27/2020 8.0  6.5 - 8.1 g/dL Final  . Albumin 95/62/1308 4.6  3.5 - 5.0 g/dL Final  . AST 65/78/4696 16  15 - 41 U/L Final  . ALT 11/27/2020 12  0 - 44 U/L Final  . Alkaline Phosphatase 11/27/2020 53  38 - 126 U/L Final  . Total Bilirubin 11/27/2020 0.6  0.3 - 1.2 mg/dL Final  . GFR, Estimated 11/27/2020 >60  >60 mL/min Final   Comment: (NOTE) Calculated using the CKD-EPI Creatinine Equation (2021)   . Anion gap 11/27/2020 13  5 - 15 Final   Performed at [facility], [address], [place], [place] [address]  . Prothrombin Time 11/27/2020 12.7  11.4 - 15.2 seconds Final  . INR 11/27/2020 1.0  0.8 - 1.2 Final   Comment: (NOTE) INR goal varies based on device and disease states. Performed at [facility], [address] [place], [place] [address]   . aPTT 11/27/2020 32  24 - 36 seconds Final   Performed at [facility], [address], [place], [place] [address]  . ABO/RH(D) 11/27/2020 O POS   Final  . Antibody Screen 11/27/2020 NEG   Final  . Sample Expiration 11/27/2020 12/11/2020,2359   Final  . Extend sample reason 11/27/2020    Final                   Value:NO TRANSFUSIONS OR PREGNANCY IN THE PAST 3 MONTHS Performed at [facility], [address] [place], [place] [address]   . Glucose-Capillary 11/27/2020 105* 70 - 99 mg/dL Final   Glucose reference range applies only to samples taken after fasting for at least 8 hours.     X-Rays:No results found.  EKG: Orders placed or performed in visit on 10/03/20  . EKG 12-Lead     Hospital Course: Kristy [name] is a 80 y.o. who was admitted to [facility]. They were brought to the operating room on 12/09/2020 and underwent Procedure(s): TOTAL KNEE ARTHROPLASTY.  Patient tolerated the procedure well and was later transferred to the recovery room and then to the orthopaedic floor for postoperative care. They were given PO and IV analgesics for pain control following their surgery. They were given 24 hours of postoperative antibiotics of  Anti-infectives (From admission, onward)   Start     Dose/Rate Route Frequency Ordered Stop   12/09/20 1830  ceFAZolin (ANCEF) IVPB 2g/100 mL premix  2 g 200 mL/hr over 30 Minutes Intravenous Every 6 hours 12/09/20 1355 12/10/20 0126   12/09/20 1015  ceFAZolin (ANCEF) IVPB 2g/100 mL premix        2 g 200 mL/hr over 30 Minutes Intravenous On call to O.R. 12/09/20 1010  12/09/20 1246     and started on DVT prophylaxis in the form of Aspirin.   PT and OT were ordered for total joint protocol. Discharge planning consulted to help with postop disposition and equipment needs.  Patient had an uneventful night on the evening of surgery. They started to get up OOB with therapy on 12/09/20. Pt was seen during rounds and was ready to go home pending progress with therapy. she worked with therapy on POD #1 and was meeting  goals. Pt was discharged to home later that day in stable condition.  Diet: Regular diet Activity: WBAT Follow-up: in two weeks Disposition: Home Discharged Condition: good   Discharge Instructions    Call MD / Call 911   Complete by: As directed    If you experience chest pain or shortness of breath, CALL 911 and be transported to the hospital emergency room.  If you develope a fever above 101 F, pus (white drainage) or increased drainage or redness at the wound, or calf pain, call your surgeon's office.   Change dressing   Complete by: As directed    You may remove the bulky bandage (ACE wrap and gauze) two days after surgery. You will have an adhesive waterproof bandage underneath. Leave this in place until your first follow-up appointment.   Constipation Prevention   Complete by: As directed    Drink plenty of fluids.  Prune juice may be helpful.  You may use a stool softener, such as Colace (over the counter) 100 mg twice a day.  Use MiraLax (over the counter) for constipation as needed.   Diet - low sodium heart healthy   Complete by: As directed    Do not put a pillow under the knee. Place it under the heel.   Complete by: As directed    Driving restrictions   Complete by: As directed    No driving for two weeks   TED hose   Complete by: As directed    Use stockings (TED hose) for three weeks on both leg(s).  You may remove them at night for sleeping.   Weight bearing as tolerated   Complete by: As directed      Allergies as of  12/10/2020      Reactions   Clonidine Hives      Medication List    STOP taking these medications   acetaminophen 500 MG tablet Commonly known as: TYLENOL   celecoxib 200 MG capsule Commonly known as: CELEBREX   ferrous sulfate 325 (65 FE) MG tablet     TAKE these medications   albuterol 108 (90 Base) MCG/ACT inhaler Commonly known as: VENTOLIN HFA INHALE 2 PUFFS BY MOUTH EVERY 6 HOURS AS NEEDED FOR WHEEZE What changed: See the new instructions.   amLODipine 10 MG tablet Commonly known as: NORVASC TAKE 1 TABLET BY MOUTH EVERY DAY   aspirin 325 MG EC tablet Take 1 (one) tablet (325 mg) by mouth 2 (two) times daily for three weeks. Then take one 81 mg aspirin once a day for three weeks. Then discontinue aspirin.   benazepril 40 MG tablet Commonly known as: LOTENSIN TAKE 1 TABLET BY MOUTH EVERY DAY   fluticasone 50 MCG/ACT nasal spray  Commonly known as: FLONASE SPRAY 2 SPRAYS INTO EACH NOSTRIL EVERY DAY What changed: See the new instructions.   hydrochlorothiazide 12.5 MG tablet Commonly known as: HYDRODIURIL Take 12.5 mg by mouth daily.   metFORMIN 500 MG tablet Commonly known as: GLUCOPHAGE TAKE 1 TABLET (500 MG TOTAL) BY MOUTH 2 (TWO) TIMES DAILY WITH A MEAL.   methocarbamol 500 MG tablet Commonly known as: ROBAXIN Take 1 tablet (500 mg total) by mouth every 6 (six) hours as needed for muscle spasms.   metoprolol succinate 50 MG 24 hr tablet Commonly known as: Toprol XL Take 1 tablet (50 mg total) by mouth daily.   oxyCODONE 5 MG immediate release tablet Commonly known as: Oxy IR/ROXICODONE Take 1-2 tablets (5-10 mg total) by mouth every 6 (six) hours as needed for severe pain.   Salonpas Pain Rel Gel-Ptch Hot 0.025-1.25 % Ptch Generic drug: Capsaicin-Menthol Place 1 patch onto the skin daily.   traMADol 50 MG tablet Commonly known as: ULTRAM TAKE 1 TABLET (50 MG TOTAL) BY MOUTH EVERY 12 (TWELVE) HOURS AS NEEDED. What changed: reasons to take this    Wixela Inhub 250-50 MCG/DOSE Aepb Generic drug: Fluticasone-Salmeterol INHALE 1 PUFF BY MOUTH EVERY 12 HOURS What changed: See the new instructions.            Discharge Care Instructions  (From admission, onward)         Start     Ordered   12/10/20 0000  Weight bearing as tolerated        12/10/20 0940   12/10/20 0000  Change dressing       Comments: You may remove the bulky bandage (ACE wrap and gauze) two days after surgery. You will have an adhesive waterproof bandage underneath. Leave this in place until your first follow-up appointment.   12/10/20 0940          Follow-up Information    Ollen Gross, MD. Schedule an appointment as soon as possible for a visit on 12/24/2020.   Specialty: Orthopedic Surgery Why: You are scheduled for a post-operative appointment on 12-24-20 at 3:00 pm.  Contact information: [address] [place] [address] [phone]        Zorita Pang.. Go on 12/12/2020.   Why: You are scheduled for a physical therapy appointment on 12-12-20 at 8:00 am.  Contact information: [address] [place] [address] [phone]               Signed: Nelia Shi, MBA, PA-C Orthopedic Surgery 12/13/2020, 8:06 AM

## 2020-12-16 DIAGNOSIS — M25561 Pain in right knee: Secondary | ICD-10-CM | POA: Diagnosis not present

## 2020-12-18 DIAGNOSIS — M25561 Pain in right knee: Secondary | ICD-10-CM | POA: Diagnosis not present

## 2020-12-23 DIAGNOSIS — M25561 Pain in right knee: Secondary | ICD-10-CM | POA: Diagnosis not present

## 2020-12-26 ENCOUNTER — Other Ambulatory Visit: Payer: Self-pay | Admitting: Internal Medicine

## 2020-12-26 DIAGNOSIS — I10 Essential (primary) hypertension: Secondary | ICD-10-CM

## 2020-12-26 DIAGNOSIS — M25561 Pain in right knee: Secondary | ICD-10-CM | POA: Diagnosis not present

## 2020-12-30 DIAGNOSIS — M25561 Pain in right knee: Secondary | ICD-10-CM | POA: Diagnosis not present

## 2021-01-02 DIAGNOSIS — M25561 Pain in right knee: Secondary | ICD-10-CM | POA: Diagnosis not present

## 2021-01-06 DIAGNOSIS — M25561 Pain in right knee: Secondary | ICD-10-CM | POA: Diagnosis not present

## 2021-01-09 DIAGNOSIS — M25561 Pain in right knee: Secondary | ICD-10-CM | POA: Diagnosis not present

## 2021-01-13 DIAGNOSIS — M25561 Pain in right knee: Secondary | ICD-10-CM | POA: Diagnosis not present

## 2021-01-14 DIAGNOSIS — Z471 Aftercare following joint replacement surgery: Secondary | ICD-10-CM | POA: Diagnosis not present

## 2021-01-14 DIAGNOSIS — Z96651 Presence of right artificial knee joint: Secondary | ICD-10-CM | POA: Diagnosis not present

## 2021-01-16 DIAGNOSIS — M25561 Pain in right knee: Secondary | ICD-10-CM | POA: Diagnosis not present

## 2021-01-20 DIAGNOSIS — M25561 Pain in right knee: Secondary | ICD-10-CM | POA: Diagnosis not present

## 2021-01-23 DIAGNOSIS — M25561 Pain in right knee: Secondary | ICD-10-CM | POA: Diagnosis not present

## 2021-01-27 DIAGNOSIS — M25561 Pain in right knee: Secondary | ICD-10-CM | POA: Diagnosis not present

## 2021-01-30 DIAGNOSIS — M25561 Pain in right knee: Secondary | ICD-10-CM | POA: Diagnosis not present

## 2021-02-05 DIAGNOSIS — Z23 Encounter for immunization: Secondary | ICD-10-CM | POA: Diagnosis not present

## 2021-02-06 DIAGNOSIS — M25561 Pain in right knee: Secondary | ICD-10-CM | POA: Diagnosis not present

## 2021-02-10 DIAGNOSIS — M25561 Pain in right knee: Secondary | ICD-10-CM | POA: Diagnosis not present

## 2021-02-13 DIAGNOSIS — M25561 Pain in right knee: Secondary | ICD-10-CM | POA: Diagnosis not present

## 2021-02-19 ENCOUNTER — Other Ambulatory Visit: Payer: Self-pay | Admitting: Adult Health

## 2021-02-19 ENCOUNTER — Other Ambulatory Visit: Payer: Self-pay | Admitting: Internal Medicine

## 2021-02-19 DIAGNOSIS — E119 Type 2 diabetes mellitus without complications: Secondary | ICD-10-CM

## 2021-02-20 DIAGNOSIS — M25561 Pain in right knee: Secondary | ICD-10-CM | POA: Diagnosis not present

## 2021-02-25 ENCOUNTER — Ambulatory Visit: Payer: Medicare Other | Admitting: Internal Medicine

## 2021-02-25 DIAGNOSIS — M25561 Pain in right knee: Secondary | ICD-10-CM | POA: Diagnosis not present

## 2021-02-27 ENCOUNTER — Other Ambulatory Visit: Payer: Self-pay

## 2021-02-28 ENCOUNTER — Telehealth: Payer: Self-pay | Admitting: *Deleted

## 2021-02-28 ENCOUNTER — Encounter: Payer: Self-pay | Admitting: Internal Medicine

## 2021-02-28 ENCOUNTER — Ambulatory Visit (HOSPITAL_COMMUNITY)
Admission: RE | Admit: 2021-02-28 | Discharge: 2021-02-28 | Disposition: A | Discharge status: Home / Self Care | Payer: Medicare Other | Source: Ambulatory Visit | Attending: Internal Medicine | Admitting: Internal Medicine

## 2021-02-28 ENCOUNTER — Ambulatory Visit (INDEPENDENT_AMBULATORY_CARE_PROVIDER_SITE_OTHER): Payer: Medicare Other | Admitting: Internal Medicine

## 2021-02-28 ENCOUNTER — Other Ambulatory Visit: Payer: Self-pay | Admitting: Internal Medicine

## 2021-02-28 VITALS — BP 160/90 | HR 83 | Temp 98.0°F | Wt 151.2 lb

## 2021-02-28 DIAGNOSIS — E119 Type 2 diabetes mellitus without complications: Secondary | ICD-10-CM

## 2021-02-28 DIAGNOSIS — R600 Localized edema: Secondary | ICD-10-CM

## 2021-02-28 DIAGNOSIS — I10 Essential (primary) hypertension: Secondary | ICD-10-CM

## 2021-02-28 DIAGNOSIS — I82511 Chronic embolism and thrombosis of right femoral vein: Secondary | ICD-10-CM

## 2021-02-28 LAB — POCT GLYCOSYLATED HEMOGLOBIN (HGB A1C): Hemoglobin A1C: 5.5 % (ref 4.0–5.6)

## 2021-02-28 MED ORDER — METFORMIN HCL 500 MG PO TABS: 500.0000 mg | ORAL_TABLET | Freq: Every day | ORAL | 1 refills | Status: DC

## 2021-02-28 MED ORDER — APIXABAN (ELIQUIS) VTE STARTER PACK (10MG AND 5MG): ORAL_TABLET | ORAL | 0 refills | Status: DC

## 2021-02-28 MED ORDER — ELIQUIS 5 MG PO TABS: 5.0000 mg | ORAL_TABLET | Freq: Two times a day (BID) | ORAL | 2 refills | Status: DC

## 2021-02-28 NOTE — Telephone Encounter (Signed)
See lab result note.

## 2021-02-28 NOTE — Progress Notes (Addendum)
Established Patient Office Visit     This visit occurred during the SARS-CoV-2 public health emergency.  Safety protocols were in place, including screening questions prior to the visit, additional usage of staff PPE, and extensive cleaning of exam room while observing appropriate contact time as indicated for disinfecting solutions.    CC/Reason for Visit: 78-month follow-up chronic medical conditions  HPI: Kristy [name] is a 80 y.o. female who is coming in today for the above mentioned reasons. Past Medical History is significant for: Well-controlled type 2 diabetes on metformin, hypertension.  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  She has had a hard time recovering from this.  She has significant pain, inability to extend her knee and significant edema that has not abated.  She tells me she was prescribed aspirin for 6 weeks following surgery.  She is very disappointed and frustrated about her lack of progress.  She continues to see orthopedics routinely, she is still attending physical therapy 2-3 times a week.  She has not been checking her blood pressure, it is noted to be elevated in office today.   Past Medical/Surgical History: Past Medical History:  Diagnosis Date  . Allergic rhinitis, seasonal   . Arthritis   . Asthma    bronchial with weather change  . 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 Dysrhythmia    "Skipping a beat"  . GERD (gastroesophageal reflux disease)   . History of colon polyps   . Hypertension   . Pneumonia   . Rotator cuff tear, right     Past Surgical History:  Procedure Laterality Date  . BACK SURGERY     x3  ruptured disc   . CHOLECYSTECTOMY OPEN  AGE 66  . EYE SURGERY     bil cataracts  . HERNIA REPAIR    . INGUINAL HERNIA REPAIR Right AGE 66s  . KNEE ARTHROSCOPY Left 2014  . LUMBAR DISC SURGERY  x2  LAST DATE EARLY 2000s  . ORIF ANKLE FRACTURE Right 06/24/2018   Procedure: OPEN REDUCTION INTERNAL  FIXATION (ORIF) RIGHT ANKLE FRACTURE;  Surgeon: Kathryne Hitch, MD;  Location: [facility];  Service: Orthopedics;  Laterality: Right;  . ROTATOR CUFF REPAIR Left 01/2015  . TONSILLECTOMY  AGE 87  . TOTAL KNEE ARTHROPLASTY Right 12/09/2020   Procedure: TOTAL KNEE ARTHROPLASTY;  Surgeon: Ollen Gross, MD;  Location: [facility];  Service: Orthopedics;  Laterality: Right;   . VAGINAL HYSTERECTOMY  AGE 67   WITH BSO    Social History:  reports that she quit smoking about 32 years ago. She quit after 10.00 years of use. She has never used smokeless tobacco. She reports current alcohol use of about 3.0 - 4.0 standard drinks of alcohol per week. She reports that she does not use drugs.  Allergies: Allergies  Allergen Reactions  . Clonidine Hives  . Tramadol Itching    Family History:  Family History  Problem Relation Age of Onset  . Colon cancer Neg Hx   . Esophageal cancer Neg Hx   . Rectal cancer Neg Hx   . Stomach cancer Neg Hx      Current Outpatient Medications:  .  albuterol (VENTOLIN HFA) 108 (90 Base) MCG/ACT inhaler, INHALE 2 PUFFS BY MOUTH EVERY 6 HOURS AS NEEDED FOR WHEEZE (Patient taking differently: Inhale 2 puffs into the lungs every 6 (six) hours as needed for shortness of breath or wheezing.), Disp: 6.7 g,  Rfl: 1 .  amLODipine (NORVASC) 10 MG tablet, TAKE 1 TABLET BY MOUTH EVERY DAY, Disp: 90 tablet, Rfl: 1 .  benazepril (LOTENSIN) 40 MG tablet, TAKE 1 TABLET BY MOUTH EVERY DAY, Disp: 90 tablet, Rfl: 1 .  fluticasone (FLONASE) 50 MCG/ACT nasal spray, SPRAY 2 SPRAYS INTO EACH NOSTRIL EVERY DAY (Patient taking differently: Place 2 sprays into both nostrils daily as needed for allergies.), Disp: 48 mL, Rfl: 0 .  hydrochlorothiazide (HYDRODIURIL) 12.5 MG tablet, Take 12.5 mg by mouth daily., Disp: , Rfl:  .  metoprolol succinate (TOPROL-XL) 50 MG 24 hr tablet, TAKE 1 TABLET BY MOUTH EVERY DAY, Disp: 90 tablet, Rfl: 3 .  oxyCODONE (OXY IR/ROXICODONE) 5 MG immediate  release tablet, Take 1-2 tablets (5-10 mg total) by mouth every 6 (six) hours as needed for severe pain., Disp: 42 tablet, Rfl: 0 .  traMADol (ULTRAM) 50 MG tablet, TAKE 1 TABLET (50 MG TOTAL) BY MOUTH EVERY 12 (TWELVE) HOURS AS NEEDED. (Patient taking differently: Take 50 mg by mouth every 12 (twelve) hours as needed (PAIN.).), Disp: 60 tablet, Rfl: 0 .  metFORMIN (GLUCOPHAGE) 500 MG tablet, Take 1 tablet (500 mg total) by mouth at bedtime., Disp: 180 tablet, Rfl: 1 .  WIXELA INHUB 250-50 MCG/DOSE AEPB, INHALE 1 PUFF BY MOUTH EVERY 12 HOURS (Patient taking differently: Inhale 1 puff into the lungs 2 (two) times daily as needed (congestion/difficulty breathing.).), Disp: 180 each, Rfl: 1  Review of Systems:  Constitutional: Denies fever, chills, diaphoresis, appetite change and fatigue.  HEENT: Denies photophobia, eye pain, redness, hearing loss, ear pain, congestion, sore throat, rhinorrhea, sneezing, mouth sores, trouble swallowing, neck pain, neck stiffness and tinnitus.   Respiratory: Denies SOB, DOE, cough, chest tightness,  and wheezing.   Cardiovascular: Denies chest pain, palpitations and leg swelling.  Gastrointestinal: Denies nausea, vomiting, abdominal pain, diarrhea, constipation, blood in stool and abdominal distention.  Genitourinary: Denies dysuria, urgency, frequency, hematuria, flank pain and difficulty urinating.  Endocrine: Denies: hot or cold intolerance, sweats, changes in hair or nails, polyuria, polydipsia. Musculoskeletal: Positive for myalgias, back pain, joint swelling, arthralgias and gait problem.  Skin: Denies pallor, rash and wound.  Neurological: Denies dizziness, seizures, syncope, weakness, light-headedness, numbness and headaches.  Hematological: Denies adenopathy. Easy bruising, personal or family bleeding history  Psychiatric/Behavioral: Denies suicidal ideation, mood changes, confusion, nervousness, sleep disturbance and agitation    Physical Exam: Vitals:    02/28/21 0851  BP: (!) 160/90  Pulse: 83  Temp: 98 F (36.7 C)  TempSrc: Oral  SpO2: 97%  Weight: 151 lb 3.2 oz (68.6 kg)    Body mass index is 28.57 kg/m.   Constitutional: NAD, calm, comfortable Eyes: PERRL, lids and conjunctivae normal ENMT: Mucous membranes are moist.  Respiratory: clear to auscultation bilaterally, no wheezing, no crackles. Normal respiratory effort. No accessory muscle use.  Cardiovascular: Regular rate and rhythm, no murmurs / rubs / gallops.  2-3+ pitting edema of the right leg from the ankles to the lower third of the thigh Psychiatric: Normal judgment and insight. Alert and oriented x 3. Normal mood.    Impression and Plan:  Essential hypertension -Blood pressure remains elevated in office today, she is not known to have elevated blood pressures routinely. -Suspect it is related to pain and anxiety surrounding her knee situation. -I have advised ambulatory blood pressure monitoring, she will notify me if consistently above target.  Leg edema, right  -She will continue to follow-up with orthopedics and PT as scheduled, however given her  sedentary state and persistence of edema and pain beyond expected timeframe, I will go ahead and order a lower extremity Doppler to make sure we are not missing a DVT.  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  Suspect this might be reason for pain and edema of her right leg beyond expected timeframe for total knee replacement.  Given this is a proximal vein I will go ahead and start anticoagulation with Eliquis 10 mg twice daily for 7 days and then 5 mg daily.  Patient will be notified of this.  Type 2 diabetes mellitus without complication, without long-term current use of insulin (HCC)  -Very well controlled with A1c today of 5.5. -I will decrease her metformin from 500 twice a day to once a  day for now, 46-month follow-up.   Patient Instructions  -Nice seeing you today!!  -[place] of your right leg.  -Stop taking morning dose of metformin. Only take 500 mg at bedtime.  -Schedule follow up in 3 months.     Chaya Jan, MD [facility]

## 2021-02-28 NOTE — Patient Instructions (Signed)
-  Nice seeing you today!!  -[place] of your right leg.  -Stop taking morning dose of metformin. Only take 500 mg at bedtime.  -Schedule follow up in 3 months.

## 2021-02-28 NOTE — Telephone Encounter (Signed)
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

## 2021-03-04 DIAGNOSIS — M25561 Pain in right knee: Secondary | ICD-10-CM | POA: Diagnosis not present

## 2021-03-06 ENCOUNTER — Telehealth: Payer: Self-pay | Admitting: Internal Medicine

## 2021-03-06 DIAGNOSIS — M25561 Pain in right knee: Secondary | ICD-10-CM | POA: Diagnosis not present

## 2021-03-06 MED ORDER — RIVAROXABAN 20 MG PO TABS: 20.0000 mg | ORAL_TABLET | Freq: Every day | ORAL | 2 refills | Status: DC

## 2021-03-06 NOTE — Telephone Encounter (Signed)
Spoke with pharmacist and Xarelto 20 mg is available with a coupon for $10 for the first 30 days and another coupon can be use after that.  Patient is aware. Copy mailed to patient's address. New Rx sent.

## 2021-03-06 NOTE — Telephone Encounter (Signed)
Spoke with patient.  She picked up and is taking the first 30 days of the prescription for $166.  Advised patient to call her insurance company to see if there is an alternative.

## 2021-03-06 NOTE — Telephone Encounter (Signed)
pt states she can not afford the APIXABAN (ELIQUIS) VTE STARTER PACK (10MG  AND 5MG ).Pt would like a call [phone]

## 2021-03-06 NOTE — Telephone Encounter (Signed)
The patient stated that she talked to her insurance company and the asked her to find out from her PCP what are the names and dosages of the alternative medication to Eliquis.  Once she has the names and dosages of the medications then she can call the insurance to see which ones are covered and let you know.

## 2021-03-11 DIAGNOSIS — M25561 Pain in right knee: Secondary | ICD-10-CM | POA: Diagnosis not present

## 2021-03-13 DIAGNOSIS — M25561 Pain in right knee: Secondary | ICD-10-CM | POA: Diagnosis not present

## 2021-03-19 DIAGNOSIS — M25561 Pain in right knee: Secondary | ICD-10-CM | POA: Diagnosis not present

## 2021-03-21 ENCOUNTER — Other Ambulatory Visit: Payer: Self-pay | Admitting: Cardiovascular Disease

## 2021-03-21 DIAGNOSIS — M25561 Pain in right knee: Secondary | ICD-10-CM | POA: Diagnosis not present

## 2021-03-24 DIAGNOSIS — Z1231 Encounter for screening mammogram for malignant neoplasm of breast: Secondary | ICD-10-CM | POA: Diagnosis not present

## 2021-03-24 LAB — HM MAMMOGRAPHY

## 2021-03-25 DIAGNOSIS — M25561 Pain in right knee: Secondary | ICD-10-CM | POA: Diagnosis not present

## 2021-03-27 ENCOUNTER — Encounter: Payer: Self-pay | Admitting: Internal Medicine

## 2021-03-27 DIAGNOSIS — M25561 Pain in right knee: Secondary | ICD-10-CM | POA: Diagnosis not present

## 2021-04-07 ENCOUNTER — Other Ambulatory Visit: Payer: Self-pay | Admitting: Internal Medicine

## 2021-04-15 ENCOUNTER — Other Ambulatory Visit: Payer: Self-pay | Admitting: Internal Medicine

## 2021-04-15 DIAGNOSIS — I10 Essential (primary) hypertension: Secondary | ICD-10-CM

## 2021-04-15 DIAGNOSIS — I82511 Chronic embolism and thrombosis of right femoral vein: Secondary | ICD-10-CM

## 2021-04-29 ENCOUNTER — Telehealth: Payer: Self-pay | Admitting: *Deleted

## 2021-04-29 NOTE — Chronic Care Management (AMB) (Signed)
  Chronic Care Management   Note  04/29/2021 Name: POLLIE POMA MRN: [id number] DOB: 04/20/1941  VERNADETTE STUTSMAN is a 80 y.o. year old female who is a primary care patient of Isaac Bliss, Rayford Halsted, MD. I reached out to [name] by phone today in response to a referral sent by Ms. Joni Reining Checketts's PCP, Isaac Bliss, Rayford Halsted, MD      Ms. Rudnicki was given information about Chronic Care Management services today including:  CCM service includes personalized support from designated clinical staff supervised by her physician, including individualized plan of care and coordination with other care providers 24/7 contact phone numbers for assistance for urgent and routine care needs. Service will only be billed when office clinical staff spend 20 minutes or more in a month to coordinate care. Only one practitioner may furnish and bill the service in a calendar month. The patient may stop CCM services at any time (effective at the end of the month) by phone call to the office staff. The patient will be responsible for cost sharing (co-pay) of up to 20% of the service fee (after annual deductible is met).  Patient agreed to services and verbal consent obtained.   Follow up plan: Telephone appointment with care management team member scheduled for:05/15/2021  [facility]

## 2021-05-13 DIAGNOSIS — Z96651 Presence of right artificial knee joint: Secondary | ICD-10-CM | POA: Diagnosis not present

## 2021-05-13 DIAGNOSIS — M1712 Unilateral primary osteoarthritis, left knee: Secondary | ICD-10-CM | POA: Diagnosis not present

## 2021-05-15 ENCOUNTER — Ambulatory Visit (INDEPENDENT_AMBULATORY_CARE_PROVIDER_SITE_OTHER): Payer: Medicare Other

## 2021-05-15 DIAGNOSIS — M1711 Unilateral primary osteoarthritis, right knee: Secondary | ICD-10-CM

## 2021-05-15 DIAGNOSIS — E119 Type 2 diabetes mellitus without complications: Secondary | ICD-10-CM

## 2021-05-15 DIAGNOSIS — I10 Essential (primary) hypertension: Secondary | ICD-10-CM

## 2021-05-15 DIAGNOSIS — I82511 Chronic embolism and thrombosis of right femoral vein: Secondary | ICD-10-CM

## 2021-05-15 NOTE — Patient Instructions (Signed)
Visit Information   PATIENT GOALS:   Goals Addressed             This Visit's Progress    RNCM:Eat Healthy   On track    Timeframe:  Long-Range Goal Priority:  Medium Start Date:           05/15/21                  Expected End Date:  10/19/21                    Follow Up Date 07/08/21    - change to whole grain breads, cereal, pasta - set goal weight - eat fish at least once per week - fill half of plate with vegetables - limit fast food meals to no more than 1 per week - manage portion size - prepare main meal at home 3 to 5 days each week - read food labels for fat, fiber, carbohydrates and portion size - reduce red meat to 2 to 3 times a week - switch to low-fat or skim milk - switch to sugar-free drinks    Why is this important?   When you are ready to manage your nutrition or weight, having a plan and setting goals will help.  Taking small steps to change how you eat and exercise is a good place to start.    Notes:      RNCM:Track and Manage My Blood Pressure-Hypertension   On track    Timeframe:  Long-Range Goal Priority:  High Start Date:        05/15/21                     Expected End Date:     10/19/21                  Follow Up Date 07/08/21    - check blood pressure 3 times per week - choose a place to take my blood pressure (home, clinic or office, retail store) - write blood pressure results in a log or diary    Why is this important?   You won't feel high blood pressure, but it can still hurt your blood vessels.  High blood pressure can cause heart or kidney problems. It can also cause a stroke.  Making lifestyle changes like losing a little weight or eating less salt will help.  Checking your blood pressure at home and at different times of the day can help to control blood pressure.  If the doctor prescribes medicine remember to take it the way the doctor ordered.  Call the office if you cannot afford the medicine or if there are questions about it.      Notes:       Hypertension, Adult Hypertension is another name for high blood pressure. High blood pressure forces your heart to work harder to pump blood. This can cause problems overtime. There are two numbers in a blood pressure reading. There is a top number (systolic) over a bottom number (diastolic). It is best to have a blood pressure that is below 120/80. Healthy choicescan help lower your blood pressure, or you may need medicine to help lower it. What are the causes? The cause of this condition is not known. Some conditions may be related tohigh blood pressure. What increases the risk? Smoking. Having type 2 diabetes mellitus, high cholesterol, or both. Not getting enough exercise or physical activity. Being overweight. Having too  much fat, sugar, calories, or salt (sodium) in your diet. Drinking too much alcohol. Having long-term (chronic) kidney disease. Having a family history of high blood pressure. Age. Risk increases with age. Race. You may be at higher risk if you are African American. Gender. Men are at higher risk than women before age 42. After age 48, women are at higher risk than men. Having obstructive sleep apnea. Stress. What are the signs or symptoms? High blood pressure may not cause symptoms. Very high blood pressure (hypertensive crisis) may cause: Headache. Feelings of worry or nervousness (anxiety). Shortness of breath. Nosebleed. A feeling of being sick to your stomach (nausea). Throwing up (vomiting). Changes in how you see. Very bad chest pain. Seizures. How is this treated? This condition is treated by making healthy lifestyle changes, such as: Eating healthy foods. Exercising more. Drinking less alcohol. Your health care provider may prescribe medicine if lifestyle changes are not enough to get your blood pressure under control, and if: Your top number is above 130. Your bottom number is above 80. Your personal target blood pressure may  vary. Follow these instructions at home: Eating and drinking  If told, follow the DASH eating plan. To follow this plan: Fill one half of your plate at each meal with fruits and vegetables. Fill one fourth of your plate at each meal with whole grains. Whole grains include whole-wheat pasta, brown rice, and whole-grain bread. Eat or drink low-fat dairy products, such as skim milk or low-fat yogurt. Fill one fourth of your plate at each meal with low-fat (lean) proteins. Low-fat proteins include fish, chicken without skin, eggs, beans, and tofu. Avoid fatty meat, cured and processed meat, or chicken with skin. Avoid pre-made or processed food. Eat less than 1,500 mg of salt each day. Do not drink alcohol if: Your doctor tells you not to drink. You are pregnant, may be pregnant, or are planning to become pregnant. If you drink alcohol: Limit how much you use to: 0-1 drink a day for women. 0-2 drinks a day for men. Be aware of how much alcohol is in your drink. In the [place] one drink equals one 12 oz bottle of beer (355 mL), one 5 oz glass of wine (148 mL), or one 1 oz glass of hard liquor (44 mL).  Lifestyle  Work with your doctor to stay at a healthy weight or to lose weight. Ask your doctor what the best weight is for you. Get at least 30 minutes of exercise most days of the week. This may include walking, swimming, or biking. Get at least 30 minutes of exercise that strengthens your muscles (resistance exercise) at least 3 days a week. This may include lifting weights or doing Pilates. Do not use any products that contain nicotine or tobacco, such as cigarettes, e-cigarettes, and chewing tobacco. If you need help quitting, ask your doctor. Check your blood pressure at home as told by your doctor. Keep all follow-up visits as told by your doctor. This is important.  Medicines Take over-the-counter and prescription medicines only as told by your doctor. Follow directions carefully. Do  not skip doses of blood pressure medicine. The medicine does not work as well if you skip doses. Skipping doses also puts you at risk for problems. Ask your doctor about side effects or reactions to medicines that you should watch for. Contact a doctor if you: Think you are having a reaction to the medicine you are taking. Have headaches that keep coming back (recurring). Feel  dizzy. Have swelling in your ankles. Have trouble with your vision. Get help right away if you: Get a very bad headache. Start to feel mixed up (confused). Feel weak or numb. Feel faint. Have very bad pain in your: Chest. Belly (abdomen). Throw up more than once. Have trouble breathing. Summary Hypertension is another name for high blood pressure. High blood pressure forces your heart to work harder to pump blood. For most people, a normal blood pressure is less than 120/80. Making healthy choices can help lower blood pressure. If your blood pressure does not get lower with healthy choices, you may need to take medicine. This information is not intended to replace advice given to you by your health care provider. Make sure you discuss any questions you have with your healthcare provider. Document Revised: 06/15/2018 Document Reviewed: 06/15/2018 Elsevier Patient Education  2022 [employer].   Consent to CCM Services: Ms. Howze was given information about Chronic Care Management services today including:  CCM service includes personalized support from designated clinical staff supervised by her physician, including individualized plan of care and coordination with other care providers 24/7 contact phone numbers for assistance for urgent and routine care needs. Service will only be billed when office clinical staff spend 20 minutes or more in a month to coordinate care. Only one practitioner may furnish and bill the service in a calendar month. The patient may stop CCM services at any time (effective at the  end of the month) by phone call to the office staff. The patient will be responsible for cost sharing (co-pay) of up to 20% of the service fee (after annual deductible is met).  Patient agreed to services and verbal consent obtained.   Patient verbalizes understanding of instructions provided today and agrees to view in [place].   Telephone follow up appointment with care management team member scheduled for:  Peter Garter RN, [facility], CDE Care Management Coordinator [facility] [phone], Mobile [phone]   CLINICAL CARE PLAN: Patient Care Plan: RNCM:Hypertension (Adult)     Problem Identified: Lack of long term self management of Hypertension   Priority: High     Long-Range Goal: Effective long term self mangament of Hypertension   This Visit's Progress: On track  Priority: High  Note:   Current Barriers:  Knowledge Deficits related to basic understanding of hypertension pathophysiology and self care management with hx of DM2, asthma,hx of  DVT Unable to independently self manage hypertension Pt states that she checks her B/P about twice a week with reading ranging from 130/70-160/80.  States that her readings were higher when she was having pain in her rt knee after her knee replacement.  States that she is having very little discomfort from her knee now.  States she is starting to go back to bowling and dance class 2xwk.  States she tries to eat healthy and does not add salt to her cooking or food.  States her Xarelto went up in price and she is thinking about just taking aspirin instead.  STates she is having less swelling in her leg now. Nurse Case Manager Clinical Goal(s):  patient will verbalize understanding of plan for hypertension management patient will not experience hospital admission. Hospital Admissions in last 6 months = 1 planned rt total knee replacement patient will attend all scheduled medical appointments: Dr Jerilee Hoh  05/30/21 patient will demonstrate improved adherence to prescribed treatment plan for hypertension as evidenced by taking all medications as prescribed, monitoring and recording blood pressure as directed,  adhering to low sodium/DASH diet patient will demonstrate improved health management independence as evidenced by checking blood pressure as directed and notifying PCP if SBP>160 or DBP > 90, taking all medications as prescribe, and adhering to a low sodium diet as discussed. patient will verbalize basic understanding of hypertension disease process and self health management plan as evidenced by readings within limits, medication adherence  Interventions:  Collaboration with Isaac Bliss, Rayford Halsted, MD regarding development and update of comprehensive plan of care as evidenced by provider attestation and co-signature-Pt is concerned about the cost of her Xarelto in the coverage gap and wants to just take aspirin Inter-disciplinary care team collaboration (see longitudinal plan of care) Evaluation of current treatment plan related to hypertension self management and patient's adherence to plan as established by provider. Provided education to patient re: stroke prevention, s/s of heart attack and stroke, DASH diet, complications of uncontrolled blood pressure Reviewed medications with patient and discussed importance of compliance Discussed plans with patient for ongoing care management follow up and provided patient with direct contact information for care management team Advised patient, providing education and rationale, to monitor blood pressure daily and record, calling PCP for findings outside established parameters.  Reviewed scheduled/upcoming provider appointments including: Dr.Hernandez 05/30/21 Referred to CCM pharm D for medication  assistance for Xarelto Self-Care Activities:  Self administers medications as prescribed Attends all scheduled provider appointments Calls pharmacy for  medication refills Attends church or other social activities Calls provider office for new concerns or questions Patient Goals  - Self administer medications as prescribed  - Attend all scheduled provider appointments  - Call provider office for new concerns, questions, or BP outside discussed parameters  - Check BP and record as discussed  - Follows a low sodium diet/DASH diet - check blood pressure 3 times per week - choose a place to take my blood pressure (home, clinic or office, retail store) - write blood pressure results in a log or diary - ask questions to understand Follow Up Plan: Telephone follow up appointment with care management team member scheduled for: 07/08/21 11:30 AM The patient has been provided with contact information for the care management team and has been advised to call with any health related questions or concerns.      Patient Care Plan: RNCM:Diabetes Type 2 (Adult)     Problem Identified: Disease Progression (Diabetes, Type 2)   Priority: Medium     Long-Range Goal: Disease Progression Prevented or Minimized   Start Date: 05/15/2021  Expected End Date: 10/19/2021  This Visit's Progress: On track  Priority: Medium  Note:   Objective:  Lab Results  Component Value Date   HGBA1C 5.5 02/28/2021   Lab Results  Component Value Date   CREATININE 0.69 12/10/2020   CREATININE 0.65 11/27/2020   CREATININE 0.81 03/15/2020   No results found for: EGFR Current Barriers:  Knowledge Deficits related to basic Diabetes pathophysiology and self care/management Unable to independently self manage type 2 DM Case Manager Clinical Goal(s):  patient will demonstrate improved adherence to prescribed treatment plan for diabetes self care/management as evidenced by: adherence to ADA/ carb modified diet exercise 3 days/week adherence to prescribed medication regimen contacting provider for new or worsened symptoms or questions Interventions:  Collaboration with  Isaac Bliss, Rayford Halsted, MD regarding development and update of comprehensive plan of care as evidenced by provider attestation and co-signature Inter-disciplinary care team collaboration (see longitudinal plan of care) Provided education to patient about basic DM disease process Reviewed  medications with patient and discussed importance of medication adherence Discussed plans with patient for ongoing care management follow up and provided patient with direct contact information for care management team Reviewed scheduled/upcoming provider appointments including: Dr. Jerilee Hoh 05/30/21 Referral made to pharmacy team for assistance with medication assistance for Xarelto Review of patient status, including review of consultants reports, relevant laboratory and other test results, and medications completed. Reviewed importance of regular exercise and recommendations to exercise 150 minutes a week including two sessions of resistance exercise weekly Self-Care Activities - Self administers oral medications as prescribed Attends all scheduled provider appointments Adheres to prescribed ADA/carb modified Patient Goals: - change to whole grain breads, cereal, pasta - drink 6 to 8 glasses of water each day - eat fish at least once per week - fill half of plate with vegetables - limit fast food meals to no more than 1 per week - manage portion size - prepare main meal at home 3 to 5 days each week - read food labels for fat, fiber, carbohydrates and portion size - reduce red meat to 2 to 3 times a week - switch to low-fat or skim milk - switch to sugar-free drinks - keep appointment with eye doctor - check feet daily for cuts, sores or redness - trim toenails straight across - wash and dry feet carefully every day - wear comfortable, cotton socks - wear comfortable, well-fitting shoes Follow Up Plan: Telephone follow up appointment with care management team member scheduled for: 07/08/21 at  11:30 AM The patient has been provided with contact information for the care management team and has been advised to call with any health related questions or concerns.

## 2021-05-15 NOTE — Chronic Care Management (AMB) (Signed)
Chronic Care Management   CCM RN Visit Note  05/15/2021 Name: Kristy [name] MRN: [id number] DOB: 09-02-1941  Subjective: Kristy [name] is a 80 y.o. year old female who is a primary care patient of Isaac Bliss, Rayford Halsted, MD. The care management team was consulted for assistance with disease management and care coordination needs.    Engaged with patient by telephone for initial visit in response to provider referral for case management and/or care coordination services.   Consent to Services:  The patient was given the following information about Chronic Care Management services today, agreed to services, and gave verbal consent: 1. CCM service includes personalized support from designated clinical staff supervised by the primary care provider, including individualized plan of care and coordination with other care providers 2. 24/7 contact phone numbers for assistance for urgent and routine care needs. 3. Service will only be billed when office clinical staff spend 20 minutes or more in a month to coordinate care. 4. Only one practitioner may furnish and bill the service in a calendar month. 5.The patient may stop CCM services at any time (effective at the end of the month) by phone call to the office staff. 6. The patient will be responsible for cost sharing (co-pay) of up to 20% of the service fee (after annual deductible is met). Patient agreed to services and consent obtained.  Patient agreed to services and verbal consent obtained.   Assessment: Review of patient past medical history, allergies, medications, health status, including review of consultants reports, laboratory and other test data, was performed as part of comprehensive evaluation and provision of chronic care management services.   SDOH (Social Determinants of Health) assessments and interventions performed:  SDOH Interventions    Flowsheet Row Most Recent Value  SDOH Interventions   Food Insecurity Interventions  Intervention Not Indicated  Financial Strain Interventions Intervention Not Indicated  Housing Interventions Intervention Not Indicated  Stress Interventions Intervention Not Indicated  Transportation Interventions Intervention Not Indicated        CCM Care Plan  Allergies  Allergen Reactions   Clonidine Hives   Tramadol Itching    Outpatient Encounter Medications as of 05/15/2021  Medication Sig   albuterol (VENTOLIN HFA) 108 (90 Base) MCG/ACT inhaler INHALE 2 PUFFS BY MOUTH EVERY 6 HOURS AS NEEDED FOR WHEEZE (Patient taking differently: Inhale 2 puffs into the lungs every 6 (six) hours as needed for shortness of breath or wheezing.)   amLODipine (NORVASC) 10 MG tablet TAKE 1 TABLET BY MOUTH EVERY DAY   benazepril (LOTENSIN) 40 MG tablet TAKE 1 TABLET BY MOUTH EVERY DAY   celecoxib (CELEBREX) 200 MG capsule TAKE 1 CAPSULE BY MOUTH EVERY DAY   fluticasone (FLONASE) 50 MCG/ACT nasal spray SPRAY 2 SPRAYS INTO EACH NOSTRIL EVERY DAY (Patient taking differently: Place 2 sprays into both nostrils daily as needed for allergies.)   hydrochlorothiazide (HYDRODIURIL) 12.5 MG tablet TAKE 1 TABLET (12.5 MG TOTAL) BY MOUTH AS NEEDED. FOR SWELLING   metFORMIN (GLUCOPHAGE) 500 MG tablet Take 1 tablet (500 mg total) by mouth at bedtime.   metoprolol succinate (TOPROL-XL) 50 MG 24 hr tablet TAKE 1 TABLET BY MOUTH EVERY DAY   oxyCODONE (OXY IR/ROXICODONE) 5 MG immediate release tablet Take 1-2 tablets (5-10 mg total) by mouth every 6 (six) hours as needed for severe pain. (Patient not taking: Reported on 05/15/2021)   rivaroxaban (XARELTO) 20 MG TABS tablet Take 1 tablet (20 mg total) by mouth daily with supper.   traMADol (ULTRAM) 50  MG tablet TAKE 1 TABLET (50 MG TOTAL) BY MOUTH EVERY 12 (TWELVE) HOURS AS NEEDED. (Patient taking differently: Take 50 mg by mouth every 12 (twelve) hours as needed (PAIN.).)   WIXELA INHUB 250-50 MCG/DOSE AEPB INHALE 1 PUFF BY MOUTH EVERY 12 HOURS (Patient taking  differently: Inhale 1 puff into the lungs 2 (two) times daily as needed (congestion/difficulty breathing.).)   No facility-administered encounter medications on file as of 05/15/2021.    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) 02/28/2021   Osteoarthritis of right knee 12/09/2020   Hypokalemia 03/07/2020   Type 2 diabetes mellitus without complication ([place]) 40/98/1191   IGT (impaired glucose tolerance) 03/14/2019   Acute labyrinthitis, unspecified laterality 09/08/2018   Trimalleolar fracture of right ankle 06/24/2018   Closed trimalleolar fracture of right ankle 06/21/2018   OA (osteoarthritis) of knee 09/22/2012   UNSPECIFIED DISORDER TEETH&SUPPORTING STRUCTURES 09/09/2010   Essential hypertension 04/07/2007   Allergic rhinitis 04/07/2007   Asthma 04/07/2007   GERD 04/07/2007   COLONIC POLYPS, HX OF 04/07/2007    Conditions to be addressed/monitored:HTN, DMII, and chronic DVT, osteoarthritis   Care Plan : RNCM:Hypertension (Adult)  Updates made by Dimitri Ped, RN since 05/15/2021 12:00 AM     Problem: Lack of long term self management of Hypertension   Priority: High     Long-Range Goal: Effective long term self mangament of Hypertension   This Visit's Progress: On track  Priority: High  Note:   Current Barriers:  Knowledge Deficits related to basic understanding of hypertension pathophysiology and self care management with hx of DM2, asthma,hx of  DVT Unable to independently self manage hypertension Pt states that she checks her B/P about twice a week with reading ranging from 130/70-160/80.  States that her readings were higher when she was having pain in her rt knee after her knee replacement.  States that she is having very little discomfort from her knee now.  States she is starting to go back to bowling and dance class 2xwk.  States she tries to eat healthy and does not add salt to her  cooking or food.  States her Xarelto went up in price and she is thinking about just taking aspirin instead.  STates she is having less swelling in her leg now. Nurse Case Manager Clinical Goal(s):  patient will verbalize understanding of plan for hypertension management patient will not experience hospital admission. Hospital Admissions in last 6 months = 1 planned rt total knee replacement patient will attend all scheduled medical appointments: Dr Jerilee Hoh 05/30/21 patient will demonstrate improved adherence to prescribed treatment plan for hypertension as evidenced by taking all medications as prescribed, monitoring and recording blood pressure as directed, adhering to low sodium/DASH diet patient will demonstrate improved health management independence as evidenced by checking blood pressure as directed and notifying PCP if SBP>160 or DBP > 90, taking all medications as prescribe, and adhering to a low sodium diet as discussed. patient will verbalize basic understanding of hypertension disease process and self health management plan as evidenced by readings within limits, medication adherence  Interventions:  Collaboration with Isaac Bliss, Rayford Halsted, MD regarding development and update of comprehensive plan of care as evidenced by provider attestation and co-signature-Pt is concerned about the cost of her Xarelto in the coverage gap and wants to just take aspirin Inter-disciplinary care team collaboration (see longitudinal plan of care) Evaluation of current treatment plan related to  hypertension self management and patient's adherence to plan as established by provider. Provided education to patient re: stroke prevention, s/s of heart attack and stroke, DASH diet, complications of uncontrolled blood pressure Reviewed medications with patient and discussed importance of compliance Discussed plans with patient for ongoing care management follow up and provided patient with direct contact  information for care management team Advised patient, providing education and rationale, to monitor blood pressure daily and record, calling PCP for findings outside established parameters.  Reviewed scheduled/upcoming provider appointments including: Dr.Hernandez 05/30/21 Referred to CCM pharm D for medication  assistance for Xarelto Self-Care Activities:  Self administers medications as prescribed Attends all scheduled provider appointments Calls pharmacy for medication refills Attends church or other social activities Calls provider office for new concerns or questions Patient Goals  - Self administer medications as prescribed  - Attend all scheduled provider appointments  - Call provider office for new concerns, questions, or BP outside discussed parameters  - Check BP and record as discussed  - Follows a low sodium diet/DASH diet - check blood pressure 3 times per week - choose a place to take my blood pressure (home, clinic or office, retail store) - write blood pressure results in a log or diary - ask questions to understand Follow Up Plan: Telephone follow up appointment with care management team member scheduled for: 07/08/21 11:30 AM The patient has been provided with contact information for the care management team and has been advised to call with any health related questions or concerns.      Care Plan : RNCM:Diabetes Type 2 (Adult)  Updates made by Dimitri Ped, RN since 05/15/2021 12:00 AM     Problem: Disease Progression (Diabetes, Type 2)   Priority: Medium     Long-Range Goal: Disease Progression Prevented or Minimized   Start Date: 05/15/2021  Expected End Date: 10/19/2021  This Visit's Progress: On track  Priority: Medium  Note:   Objective:  Lab Results  Component Value Date   HGBA1C 5.5 02/28/2021   Lab Results  Component Value Date   CREATININE 0.69 12/10/2020   CREATININE 0.65 11/27/2020   CREATININE 0.81 03/15/2020   No results found for:  EGFR Current Barriers:  Knowledge Deficits related to basic Diabetes pathophysiology and self care/management Unable to independently self manage type 2 DM Case Manager Clinical Goal(s):  patient will demonstrate improved adherence to prescribed treatment plan for diabetes self care/management as evidenced by: adherence to ADA/ carb modified diet exercise 3 days/week adherence to prescribed medication regimen contacting provider for new or worsened symptoms or questions Interventions:  Collaboration with Isaac Bliss, Rayford Halsted, MD regarding development and update of comprehensive plan of care as evidenced by provider attestation and co-signature Inter-disciplinary care team collaboration (see longitudinal plan of care) Provided education to patient about basic DM disease process Reviewed medications with patient and discussed importance of medication adherence Discussed plans with patient for ongoing care management follow up and provided patient with direct contact information for care management team Reviewed scheduled/upcoming provider appointments including: Dr. Jerilee Hoh 05/30/21 Referral made to pharmacy team for assistance with medication assistance for Xarelto Review of patient status, including review of consultants reports, relevant laboratory and other test results, and medications completed. Reviewed importance of regular exercise and recommendations to exercise 150 minutes a week including two sessions of resistance exercise weekly Self-Care Activities - Self administers oral medications as prescribed Attends all scheduled provider appointments Adheres to prescribed ADA/carb modified Patient Goals: - change to whole grain  breads, cereal, pasta - drink 6 to 8 glasses of water each day - eat fish at least once per week - fill half of plate with vegetables - limit fast food meals to no more than 1 per week - manage portion size - prepare main meal at home 3 to 5 days each  week - read food labels for fat, fiber, carbohydrates and portion size - reduce red meat to 2 to 3 times a week - switch to low-fat or skim milk - switch to sugar-free drinks - keep appointment with eye doctor - check feet daily for cuts, sores or redness - trim toenails straight across - wash and dry feet carefully every day - wear comfortable, cotton socks - wear comfortable, well-fitting shoes Follow Up Plan: Telephone follow up appointment with care management team member scheduled for: 07/08/21 at 11:30 AM The patient has been provided with contact information for the care management team and has been advised to call with any health related questions or concerns.       Plan:Telephone follow up appointment with care management team member scheduled for:  07/08/21 and The patient has been provided with contact information for the care management team and has been advised to call with any health related questions or concerns.  Peter Garter RN, Jackquline Denmark, CDE Care Management Coordinator [facility] [phone], Mobile [phone]

## 2021-05-26 DIAGNOSIS — R519 Headache, unspecified: Secondary | ICD-10-CM | POA: Diagnosis not present

## 2021-05-26 DIAGNOSIS — S0990XA Unspecified injury of head, initial encounter: Secondary | ICD-10-CM | POA: Diagnosis not present

## 2021-05-26 DIAGNOSIS — K449 Diaphragmatic hernia without obstruction or gangrene: Secondary | ICD-10-CM | POA: Diagnosis not present

## 2021-05-26 DIAGNOSIS — W108XXA Fall (on) (from) other stairs and steps, initial encounter: Secondary | ICD-10-CM | POA: Diagnosis not present

## 2021-05-26 DIAGNOSIS — Z743 Need for continuous supervision: Secondary | ICD-10-CM | POA: Diagnosis not present

## 2021-05-26 DIAGNOSIS — E119 Type 2 diabetes mellitus without complications: Secondary | ICD-10-CM | POA: Diagnosis not present

## 2021-05-26 DIAGNOSIS — S42021A Displaced fracture of shaft of right clavicle, initial encounter for closed fracture: Secondary | ICD-10-CM | POA: Diagnosis not present

## 2021-05-26 DIAGNOSIS — S199XXA Unspecified injury of neck, initial encounter: Secondary | ICD-10-CM | POA: Diagnosis not present

## 2021-05-26 DIAGNOSIS — T1490XA Injury, unspecified, initial encounter: Secondary | ICD-10-CM | POA: Diagnosis not present

## 2021-05-26 DIAGNOSIS — I77810 Thoracic aortic ectasia: Secondary | ICD-10-CM | POA: Diagnosis not present

## 2021-05-26 DIAGNOSIS — Z86718 Personal history of other venous thrombosis and embolism: Secondary | ICD-10-CM | POA: Diagnosis not present

## 2021-05-26 DIAGNOSIS — Z7901 Long term (current) use of anticoagulants: Secondary | ICD-10-CM | POA: Diagnosis not present

## 2021-05-26 DIAGNOSIS — Z87891 Personal history of nicotine dependence: Secondary | ICD-10-CM | POA: Diagnosis not present

## 2021-05-26 DIAGNOSIS — X58XXXA Exposure to other specified factors, initial encounter: Secondary | ICD-10-CM | POA: Diagnosis not present

## 2021-05-26 DIAGNOSIS — I10 Essential (primary) hypertension: Secondary | ICD-10-CM | POA: Diagnosis not present

## 2021-05-26 DIAGNOSIS — I517 Cardiomegaly: Secondary | ICD-10-CM | POA: Diagnosis not present

## 2021-05-26 DIAGNOSIS — W19XXXA Unspecified fall, initial encounter: Secondary | ICD-10-CM | POA: Diagnosis not present

## 2021-05-26 DIAGNOSIS — M898X2 Other specified disorders of bone, upper arm: Secondary | ICD-10-CM | POA: Diagnosis not present

## 2021-05-26 DIAGNOSIS — J45909 Unspecified asthma, uncomplicated: Secondary | ICD-10-CM | POA: Diagnosis not present

## 2021-05-26 DIAGNOSIS — S42031A Displaced fracture of lateral end of right clavicle, initial encounter for closed fracture: Secondary | ICD-10-CM | POA: Diagnosis not present

## 2021-05-30 ENCOUNTER — Ambulatory Visit: Payer: Medicare Other | Admitting: Internal Medicine

## 2021-06-02 ENCOUNTER — Other Ambulatory Visit: Payer: Self-pay

## 2021-06-03 ENCOUNTER — Encounter: Payer: Self-pay | Admitting: Internal Medicine

## 2021-06-03 ENCOUNTER — Ambulatory Visit (INDEPENDENT_AMBULATORY_CARE_PROVIDER_SITE_OTHER): Payer: Medicare Other | Admitting: Internal Medicine

## 2021-06-03 VITALS — BP 130/70 | HR 69 | Temp 98.1°F | Wt 158.2 lb

## 2021-06-03 DIAGNOSIS — W19XXXD Unspecified fall, subsequent encounter: Secondary | ICD-10-CM | POA: Diagnosis not present

## 2021-06-03 DIAGNOSIS — I10 Essential (primary) hypertension: Secondary | ICD-10-CM | POA: Diagnosis not present

## 2021-06-03 DIAGNOSIS — I82511 Chronic embolism and thrombosis of right femoral vein: Secondary | ICD-10-CM | POA: Diagnosis not present

## 2021-06-03 DIAGNOSIS — K219 Gastro-esophageal reflux disease without esophagitis: Secondary | ICD-10-CM

## 2021-06-03 DIAGNOSIS — E119 Type 2 diabetes mellitus without complications: Secondary | ICD-10-CM | POA: Diagnosis not present

## 2021-06-03 LAB — POCT GLYCOSYLATED HEMOGLOBIN (HGB A1C): Hemoglobin A1C: 5.8 % — AB (ref 4.0–5.6)

## 2021-06-03 MED ORDER — HYDROCODONE-ACETAMINOPHEN 5-325 MG PO TABS: 1.0000 | ORAL_TABLET | Freq: Four times a day (QID) | ORAL | 0 refills | Status: DC | PRN

## 2021-06-03 NOTE — Progress Notes (Signed)
Established Patient Office Visit     This visit occurred during the SARS-CoV-2 public health emergency.  Safety protocols were in place, including screening questions prior to the visit, additional usage of staff PPE, and extensive cleaning of exam room while observing appropriate contact time as indicated for disinfecting solutions.    CC/Reason for Visit: Follow-up chronic medical conditions  HPI: Kristy [name] is a 80 y.o. female who is coming in today for the above mentioned reasons. Past Medical History is significant for: Well-controlled type 2 diabetes on metformin, hypertension.  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  She subsequently developed a right lower extremity DVT and is anticoagulated on Xarelto until November.  Unfortunately, last week while on vacation in [place], she suffered a mechanical fall, falling down 9 steps.  She was transferred to a local trauma center where all x-rays and CT scans including of the head and chest were negative with the exception of a right clavicle fracture.  She was placed in a sling.  She has her first follow-up with orthopedics 2 days from now.  She is in significant pain.  She has run out of the hydrocodone that she was prescribed and is requesting refills today.   Past Medical/Surgical History: Past Medical History:  Diagnosis Date   Allergic rhinitis, seasonal    Arthritis    Asthma    bronchial with weather change   Diabetes mellitus without complication (HCC)    type 2    Dysrhythmia    "Skipping a beat"   GERD (gastroesophageal reflux disease)    History of colon polyps    Hypertension    Pneumonia    Rotator cuff tear, right     Past Surgical History:  Procedure Laterality Date   BACK SURGERY     x3  ruptured disc    CHOLECYSTECTOMY OPEN  AGE 80   EYE SURGERY     bil cataracts   HERNIA REPAIR     INGUINAL HERNIA REPAIR Right AGE 80s   KNEE ARTHROSCOPY Left 2014   LUMBAR DISC  SURGERY  x2  LAST DATE EARLY 2000s   ORIF ANKLE FRACTURE Right 06/24/2018   Procedure: OPEN REDUCTION INTERNAL FIXATION (ORIF) RIGHT ANKLE FRACTURE;  Surgeon: Kathryne Hitch, MD;  Location: [facility];  Service: Orthopedics;  Laterality: Right;   ROTATOR CUFF REPAIR Left 01/2015   TONSILLECTOMY  AGE 27   TOTAL KNEE ARTHROPLASTY Right 12/09/2020   Procedure: TOTAL KNEE ARTHROPLASTY;  Surgeon: Ollen Gross, MD;  Location: [facility];  Service: Orthopedics;  Laterality: Right;    VAGINAL HYSTERECTOMY  AGE 44   WITH BSO    Social History:  reports that she quit smoking about 32 years ago. Her smoking use included cigarettes. She has never used smokeless tobacco. She reports current alcohol use of about 3.0 - 4.0 standard drinks per week. She reports that she does not use drugs.  Allergies: Allergies  Allergen Reactions   Clonidine Hives   Tramadol Itching    Family History:  Family History  Problem Relation Age of Onset   Colon cancer Neg Hx    Esophageal cancer Neg Hx    Rectal cancer Neg Hx    Stomach cancer Neg Hx      Current Outpatient Medications:    albuterol (VENTOLIN HFA) 108 (90 Base) MCG/ACT inhaler, INHALE 2 PUFFS BY MOUTH EVERY 6 HOURS AS NEEDED FOR WHEEZE (Patient taking differently: Inhale 2 puffs into  the lungs every 6 (six) hours as needed for shortness of breath or wheezing.), Disp: 6.7 g, Rfl: 1   amLODipine (NORVASC) 10 MG tablet, TAKE 1 TABLET BY MOUTH EVERY DAY, Disp: 90 tablet, Rfl: 1   benazepril (LOTENSIN) 40 MG tablet, TAKE 1 TABLET BY MOUTH EVERY DAY, Disp: 90 tablet, Rfl: 1   celecoxib (CELEBREX) 200 MG capsule, TAKE 1 CAPSULE BY MOUTH EVERY DAY, Disp: 90 capsule, Rfl: 0   fluticasone (FLONASE) 50 MCG/ACT nasal spray, SPRAY 2 SPRAYS INTO EACH NOSTRIL EVERY DAY (Patient taking differently: Place 2 sprays into both nostrils daily as needed for allergies.), Disp: 48 mL, Rfl: 0   hydrochlorothiazide (HYDRODIURIL) 12.5 MG tablet, TAKE 1 TABLET (12.5 MG  TOTAL) BY MOUTH AS NEEDED. FOR SWELLING, Disp: 90 tablet, Rfl: 3   metFORMIN (GLUCOPHAGE) 500 MG tablet, Take 1 tablet (500 mg total) by mouth at bedtime., Disp: 180 tablet, Rfl: 1   metoprolol succinate (TOPROL-XL) 50 MG 24 hr tablet, TAKE 1 TABLET BY MOUTH EVERY DAY, Disp: 90 tablet, Rfl: 3   oxyCODONE (OXY IR/ROXICODONE) 5 MG immediate release tablet, Take 1-2 tablets (5-10 mg total) by mouth every 6 (six) hours as needed for severe pain., Disp: 42 tablet, Rfl: 0   rivaroxaban (XARELTO) 20 MG TABS tablet, Take 1 tablet (20 mg total) by mouth daily with supper., Disp: 30 tablet, Rfl: 2   traMADol (ULTRAM) 50 MG tablet, TAKE 1 TABLET (50 MG TOTAL) BY MOUTH EVERY 12 (TWELVE) HOURS AS NEEDED. (Patient taking differently: Take 50 mg by mouth every 12 (twelve) hours as needed (PAIN.).), Disp: 60 tablet, Rfl: 0   WIXELA INHUB 250-50 MCG/DOSE AEPB, INHALE 1 PUFF BY MOUTH EVERY 12 HOURS (Patient taking differently: Inhale 1 puff into the lungs 2 (two) times daily as needed (congestion/difficulty breathing.).), Disp: 180 each, Rfl: 1  Review of Systems:  Constitutional: Denies fever, chills, diaphoresis, appetite change and fatigue.  HEENT: Denies photophobia, eye pain, redness, hearing loss, ear pain, congestion, sore throat, rhinorrhea, sneezing, mouth sores, trouble swallowing, neck pain, neck stiffness and tinnitus.   Respiratory: Denies SOB, DOE, cough, chest tightness,  and wheezing.   Cardiovascular: Denies chest pain, palpitations and leg swelling.  Gastrointestinal: Denies nausea, vomiting, abdominal pain, diarrhea, constipation, blood in stool and abdominal distention.  Genitourinary: Denies dysuria, urgency, frequency, hematuria, flank pain and difficulty urinating.  Endocrine: Denies: hot or cold intolerance, sweats, changes in hair or nails, polyuria, polydipsia. Musculoskeletal: Denies myalgias, back pain, joint swelling, arthralgias and gait problem.  Skin: Denies pallor, rash and wound.   Neurological: Denies dizziness, seizures, syncope, weakness, light-headedness, numbness and headaches.  Hematological: Denies adenopathy. Easy bruising, personal or family bleeding history  Psychiatric/Behavioral: Denies suicidal ideation, mood changes, confusion, nervousness, sleep disturbance and agitation    Physical Exam: Vitals:   06/03/21 1141  BP: 130/70  Pulse: 69  Temp: 98.1 F (36.7 C)  TempSrc: Oral  SpO2: 96%  Weight: 158 lb 3.2 oz (71.8 kg)    Body mass index is 29.89 kg/m.   Constitutional: NAD, calm, comfortable, right arm in a sling Eyes: PERRL, lids and conjunctivae normal ENMT: Mucous membranes are moist.  Respiratory: clear to auscultation bilaterally, no wheezing, no crackles. Normal respiratory effort. No accessory muscle use.  Cardiovascular: Regular rate and rhythm, no murmurs / rubs / gallops.  Psychiatric: Normal judgment and insight. Alert and oriented x 3. Normal mood.    Impression and Plan:  Type 2 diabetes mellitus without complication, unspecified whether long term insulin use (  HCC)  -A1c today demonstrates good control at 5.8.  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  Essential hypertension -Well-controlled.  Gastroesophageal reflux disease without esophagitis -Symptoms are well controlled, not on daily PPI therapy.  Accident due to mechanical fall without injury, subsequent encounter -I have reviewed her hospital charts in detail that she has brought with her.  She had a CT angiogram of the chest, CT scan of the head and chest x-ray that were all negative with the exception of a right clavicular fracture.  She also had hip and leg x-rays performed that were negative. -She is wearing a sling and is slated to see orthopedics next week. -I have provided 30 tablets for her to take as needed for pain related to her fall.  Time spent: 31 minutes  reviewing chart, interviewing and examining patient and formulating plan of care.    Chaya Jan, MD [facility]

## 2021-06-12 DIAGNOSIS — M5136 Other intervertebral disc degeneration, lumbar region: Secondary | ICD-10-CM | POA: Diagnosis not present

## 2021-06-12 DIAGNOSIS — M545 Low back pain, unspecified: Secondary | ICD-10-CM | POA: Diagnosis not present

## 2021-06-12 DIAGNOSIS — M25511 Pain in right shoulder: Secondary | ICD-10-CM | POA: Diagnosis not present

## 2021-06-24 DIAGNOSIS — Z96651 Presence of right artificial knee joint: Secondary | ICD-10-CM | POA: Diagnosis not present

## 2021-06-30 DIAGNOSIS — S42001D Fracture of unspecified part of right clavicle, subsequent encounter for fracture with routine healing: Secondary | ICD-10-CM | POA: Diagnosis not present

## 2021-06-30 DIAGNOSIS — M545 Low back pain, unspecified: Secondary | ICD-10-CM | POA: Diagnosis not present

## 2021-07-03 ENCOUNTER — Other Ambulatory Visit: Payer: Self-pay | Admitting: Internal Medicine

## 2021-07-07 DIAGNOSIS — M154 Erosive (osteo)arthritis: Secondary | ICD-10-CM | POA: Diagnosis not present

## 2021-07-07 DIAGNOSIS — Z79899 Other long term (current) drug therapy: Secondary | ICD-10-CM | POA: Diagnosis not present

## 2021-07-07 DIAGNOSIS — M2550 Pain in unspecified joint: Secondary | ICD-10-CM | POA: Diagnosis not present

## 2021-07-07 DIAGNOSIS — E663 Overweight: Secondary | ICD-10-CM | POA: Diagnosis not present

## 2021-07-07 DIAGNOSIS — Z6828 Body mass index (BMI) 28.0-28.9, adult: Secondary | ICD-10-CM | POA: Diagnosis not present

## 2021-07-07 DIAGNOSIS — M150 Primary generalized (osteo)arthritis: Secondary | ICD-10-CM | POA: Diagnosis not present

## 2021-07-08 ENCOUNTER — Ambulatory Visit (INDEPENDENT_AMBULATORY_CARE_PROVIDER_SITE_OTHER): Payer: Medicare Other

## 2021-07-08 DIAGNOSIS — I10 Essential (primary) hypertension: Secondary | ICD-10-CM

## 2021-07-08 DIAGNOSIS — W19XXXD Unspecified fall, subsequent encounter: Secondary | ICD-10-CM

## 2021-07-08 DIAGNOSIS — M1711 Unilateral primary osteoarthritis, right knee: Secondary | ICD-10-CM

## 2021-07-08 DIAGNOSIS — E119 Type 2 diabetes mellitus without complications: Secondary | ICD-10-CM

## 2021-07-08 NOTE — Chronic Care Management (AMB) (Signed)
Chronic Care Management   CCM RN Visit Note  07/08/2021 Name: Kristy [name] MRN: [id number] DOB: Nov 21, 1940  Subjective: Kristy [name] is a 80 y.o. year old female who is a primary care patient of Isaac Bliss, Rayford Halsted, MD. The care management team was consulted for assistance with disease management and care coordination needs.    Engaged with patient by telephone for follow up visit in response to provider referral for case management and/or care coordination services.   Consent to Services:  The patient was given information about Chronic Care Management services, agreed to services, and gave verbal consent prior to initiation of services.  Please see initial visit note for detailed documentation.   Patient agreed to services and verbal consent obtained.   Assessment: Review of patient past medical history, allergies, medications, health status, including review of consultants reports, laboratory and other test data, was performed as part of comprehensive evaluation and provision of chronic care management services.   SDOH (Social Determinants of Health) assessments and interventions performed:    CCM Care Plan  Allergies  Allergen Reactions   Clonidine Hives   Tramadol Itching    Outpatient Encounter Medications as of 07/08/2021  Medication Sig   albuterol (VENTOLIN HFA) 108 (90 Base) MCG/ACT inhaler INHALE 2 PUFFS BY MOUTH EVERY 6 HOURS AS NEEDED FOR WHEEZE (Patient taking differently: Inhale 2 puffs into the lungs every 6 (six) hours as needed for shortness of breath or wheezing.)   amLODipine (NORVASC) 10 MG tablet TAKE 1 TABLET BY MOUTH EVERY DAY   benazepril (LOTENSIN) 40 MG tablet TAKE 1 TABLET BY MOUTH EVERY DAY   celecoxib (CELEBREX) 200 MG capsule TAKE 1 CAPSULE BY MOUTH EVERY DAY   fluticasone (FLONASE) 50 MCG/ACT nasal spray SPRAY 2 SPRAYS INTO EACH NOSTRIL EVERY DAY (Patient taking differently: Place 2 sprays into both nostrils daily as needed for allergies.)    hydrochlorothiazide (HYDRODIURIL) 12.5 MG tablet TAKE 1 TABLET (12.5 MG TOTAL) BY MOUTH AS NEEDED. FOR SWELLING   HYDROcodone-acetaminophen (NORCO) 5-325 MG tablet Take 1 tablet by mouth every 6 (six) hours as needed for moderate pain.   metFORMIN (GLUCOPHAGE) 500 MG tablet Take 1 tablet (500 mg total) by mouth at bedtime.   metoprolol succinate (TOPROL-XL) 50 MG 24 hr tablet TAKE 1 TABLET BY MOUTH EVERY DAY   oxyCODONE (OXY IR/ROXICODONE) 5 MG immediate release tablet Take 1-2 tablets (5-10 mg total) by mouth every 6 (six) hours as needed for severe pain.   rivaroxaban (XARELTO) 20 MG TABS tablet Take 1 tablet (20 mg total) by mouth daily with supper.   traMADol (ULTRAM) 50 MG tablet TAKE 1 TABLET (50 MG TOTAL) BY MOUTH EVERY 12 (TWELVE) HOURS AS NEEDED. (Patient taking differently: Take 50 mg by mouth every 12 (twelve) hours as needed (PAIN.).)   WIXELA INHUB 250-50 MCG/DOSE AEPB INHALE 1 PUFF BY MOUTH EVERY 12 HOURS (Patient taking differently: Inhale 1 puff into the lungs 2 (two) times daily as needed (congestion/difficulty breathing.).)   No facility-administered encounter medications on file as of 07/08/2021.    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) 02/28/2021   Osteoarthritis of right knee 12/09/2020   Hypokalemia 03/07/2020   Type 2 diabetes mellitus without complication ([place]) 15/37/9432   IGT (impaired glucose tolerance) 03/14/2019   Acute labyrinthitis, unspecified laterality 09/08/2018   Trimalleolar fracture of right ankle 06/24/2018   Closed trimalleolar fracture of right ankle 06/21/2018  OA (osteoarthritis) of knee 09/22/2012   UNSPECIFIED DISORDER TEETH&SUPPORTING STRUCTURES 09/09/2010   Essential hypertension 04/07/2007   Allergic rhinitis 04/07/2007   Asthma 04/07/2007   GERD 04/07/2007   COLONIC POLYPS, HX OF 04/07/2007    Conditions to be addressed/monitored:HTN, DMII, and osteoarthritis ,  hx fall with injury  Care Plan : RNCM:Hypertension (Adult)  Updates made by Dimitri Ped, RN since 07/08/2021 12:00 AM     Problem: Lack of long term self management of Hypertension   Priority: High     Long-Range Goal: Effective long term self mangament of Hypertension   Start Date: 05/15/2021  Expected End Date: 10/19/2021  This Visit's Progress: On track  Recent Progress: On track  Priority: High  Note:   Current Barriers:  Knowledge Deficits related to basic understanding of hypertension pathophysiology and self care management with hx of DM2, asthma,hx of  DVT Unable to independently self manage hypertension Pt states that she  her B/P was 120/58 at the doctors visit yesterday Readings at home are ranging from 130/70-160/80.    States she tries to eat healthy and does not add salt to her cooking or food.  States she is getting pharmacy assistance on her  Xarelto now.  States she is not able to exercise since her fall . Nurse Case Manager Clinical Goal(s):  patient will verbalize understanding of plan for hypertension management patient will not experience hospital admission. Hospital Admissions in last 6 months = 1 planned rt total knee replacement patient will attend all scheduled medical appointments: Dr Jerilee Hoh 09/03/21 patient will demonstrate improved adherence to prescribed treatment plan for hypertension as evidenced by taking all medications as prescribed, monitoring and recording blood pressure as directed, adhering to low sodium/DASH diet patient will demonstrate improved health management independence as evidenced by checking blood pressure as directed and notifying PCP if SBP>160 or DBP > 90, taking all medications as prescribe, and adhering to a low sodium diet as discussed. patient will verbalize basic understanding of hypertension disease process and self health management plan as evidenced by readings within limits, medication adherence  Interventions:   Collaboration with Isaac Bliss, Rayford Halsted, MD regarding development and update of comprehensive plan of care as evidenced by provider attestation and co-signature Inter-disciplinary care team collaboration (see longitudinal plan of care) Evaluation of current treatment plan related to hypertension self management and patient's adherence to plan as established by provider. Reinforced education to patient re: stroke prevention, s/s of heart attack and stroke, DASH diet, complications of uncontrolled blood pressure Reviewed medications with patient and discussed importance of compliance Discussed plans with patient for ongoing care management follow up and provided patient with direct contact information for care management team Reinforced to monitor blood pressure three times a week and record, calling PCP for findings outside established parameters.  Reviewed scheduled/upcoming provider appointments including: Dr.Hernandez 09/03/21 Referred to CCM pharm D for medication  assistance for Xarelto= now getting assistance from drug company Self-Care Activities:  Self administers medications as prescribed Attends all scheduled provider appointments Calls pharmacy for medication refills Attends church or other social activities Calls provider office for new concerns or questions Patient Goals  - Self administer medications as prescribed  - Attend all scheduled provider appointments  - Call provider office for new concerns, questions, or BP outside discussed parameters  - Check BP and record as discussed  - Follows a low sodium diet/DASH diet - check blood pressure 3 times per week - choose a place to take my blood  pressure (home, clinic or office, retail store) - write blood pressure results in a log or diary - ask questions to understand Follow Up Plan: Telephone follow up appointment with care management team member scheduled for: 08/07/21 11:30 AM The patient has been provided with contact  information for the care management team and has been advised to call with any health related questions or concerns.      Care Plan : RNCM:Diabetes Type 2 (Adult)  Updates made by Dimitri Ped, RN since 07/08/2021 12:00 AM     Problem: Disease Progression (Diabetes, Type 2)   Priority: Medium     Long-Range Goal: Disease Progression Prevented or Minimized   Start Date: 05/15/2021  Expected End Date: 10/19/2021  This Visit's Progress: On track  Recent Progress: On track  Priority: Medium  Note:   Objective:  Lab Results  Component Value Date   HGBA1C 5.5 02/28/2021   Lab Results  Component Value Date   CREATININE 0.69 12/10/2020   CREATININE 0.65 11/27/2020   CREATININE 0.81 03/15/2020   No results found for: EGFR Current Barriers:  Knowledge Deficits related to basic Diabetes pathophysiology and self care/management Unable to independently self manage type 2 DM States she tries to follow a low CHO low sodium diet.  States has not been able to exercise since her fall.  She does not check CBG since her sugars are well controlled Case Manager Clinical Goal(s):  patient will demonstrate improved adherence to prescribed treatment plan for diabetes self care/management as evidenced by: adherence to ADA/ carb modified diet exercise 3 days/week adherence to prescribed medication regimen contacting provider for new or worsened symptoms or questions Interventions:  Collaboration with Isaac Bliss, Rayford Halsted, MD regarding development and update of comprehensive plan of care as evidenced by provider attestation and co-signature Inter-disciplinary care team collaboration (see longitudinal plan of care) Reinforced  education to patient about basic DM disease process Reviewed medications with patient and discussed importance of medication adherence Discussed plans with patient for ongoing care management follow up and provided patient with direct contact information for care management  team Reviewed scheduled/upcoming provider appointments including: Dr. Jerilee Hoh 09/03/21 Referral made to pharmacy team for assistance with medication assistance for Xarelto-now getting assistance from drug company Review of patient status, including review of consultants reports, relevant laboratory and other test results, and medications completed. Reinforced to follow a low carbohydrate low salt diet and to watch portion sizes Self-Care Activities - Self administers oral medications as prescribed Attends all scheduled provider appointments Adheres to prescribed ADA/carb modified Patient Goals: - change to whole grain breads, cereal, pasta - drink 6 to 8 glasses of water each day - eat fish at least once per week - fill half of plate with vegetables - limit fast food meals to no more than 1 per week - manage portion size - prepare main meal at home 3 to 5 days each week - read food labels for fat, fiber, carbohydrates and portion size - reduce red meat to 2 to 3 times a week - switch to low-fat or skim milk - switch to sugar-free drinks - keep appointment with eye doctor - check feet daily for cuts, sores or redness - trim toenails straight across - wash and dry feet carefully every day - wear comfortable, cotton socks - wear comfortable, well-fitting shoes Follow Up Plan: Telephone follow up appointment with care management team member scheduled for: 08/07/21 at 11:30 AM The patient has been provided with contact information for the  care management team and has been advised to call with any health related questions or concerns.      Care Plan : RNCM:Fall Risk (Adult)  Updates made by Dimitri Ped, RN since 07/08/2021 12:00 AM     Problem: Fall Risk   Priority: Medium     Long-Range Goal: Absence of Fall and Fall-Related Injury   Start Date: 07/08/2021  Expected End Date: 10/19/2021  This Visit's Progress: On track  Priority: Medium  Note:   Current Barriers:   Knowledge Deficits related to fall precautions in patient with osteoarthritis and hx fall with fx rt clavicle and injured lower back Unable to independently self manage fall precautions Chronic Disease Management support and education needs related to self management of fall precautions States she had a fall down 9 stairs while she was on vacation at [facility].  States she fx her rt collar bone and injured her back.  States she had been seeing orthopedic doctors for her injuries.  States she is having pain in her lower back that she is taking pain medication, muscle relaxer's and using heat to help relieve the pain.  States she is to have a MRI on Thursday. Clinical Goal(s):  patient will demonstrate improved adherence to prescribed treatment plan for decreasing falls as evidenced by patient reporting and review of EMR patient will verbalize using fall risk reduction strategies discussed patient will not experience additional falls patient will verbalize understanding of plan for self management of fall precautions patient will take all medications exactly as prescribed and will call provider for medication related questions patient will attend all scheduled medical appointments: Dr. Jerilee Hoh 09/03/21 patient will demonstrate improved health management independence Interventions:  Collaboration with Isaac Bliss, Rayford Halsted, MD regarding development and update of comprehensive plan of care as evidenced by provider attestation and co-signature Inter-disciplinary care team collaboration (see longitudinal plan of care) Provided written and verbal education re: Potential causes of falls and Fall prevention strategies Reviewed medications and discussed potential side effects of medications such as dizziness and frequent urination Assessed for s/s of orthostatic hypotension Assessed for falls since last encounter. Assessed patients knowledge of fall risk prevention secondary to previously  provided education. Assessed working status of life alert bracelet and patient adherence Provided patient information for fall alert systems Evaluation of current treatment plan related to self management of fall precautions and patient's adherence to plan as established by provider. Reviewed medications with patient and discussed adherence to plan of care Reviewed scheduled/upcoming provider appointments including:  Discussed plans with patient for ongoing care management follow up and provided patient with direct contact information for care management team Self-Care Deficits:  Unable to independently self management of fall precautions Patient Goals:  - Utilize cane (assistive device) appropriately with all ambulation - De-clutter walkways - Change positions slowly - Wear secure fitting shoes at all times with ambulation - Utilize home lighting for dim lit areas - Demonstrate self and pet awareness at all times - always use handrails on the stairs - always wear low-heeled or flat shoes or slippers with nonskid soles - keep a flashlight by the bed - keep my cell phone with me always - make an emergency alert plan in case I fall - pick up clutter from the floors - use a nonslip pad with throw rugs, or remove them completely - use a cane or walker - use a nightlight in the bathroom - wear my glasses and/or hearing aid Follow Up Plan: Telephone follow up  appointment with care management team member scheduled for: 08/07/21 at 11:30 AM The patient has been provided with contact information for the care management team and has been advised to call with any health related questions or concerns.       Plan:Telephone follow up appointment with care management team member scheduled for:  08/07/21 and The patient has been provided with contact information for the care management team and has been advised to call with any health related questions or concerns.  Peter Garter RN, Jackquline Denmark,  CDE Care Management Coordinator [facility] [phone], Mobile [phone]

## 2021-07-08 NOTE — Patient Instructions (Signed)
Visit Information  PATIENT GOALS:  Goals Addressed             This Visit's Progress    RNCM:Eat Healthy   On track    Timeframe:  Long-Range Goal Priority:  Medium Start Date:           05/15/21                  Expected End Date:  10/19/21                    Follow Up Date 08/07/21    - change to whole grain breads, cereal, pasta - set goal weight - eat fish at least once per week - fill half of plate with vegetables - limit fast food meals to no more than 1 per week - manage portion size - prepare main meal at home 3 to 5 days each week - read food labels for fat, fiber, carbohydrates and portion size - reduce red meat to 2 to 3 times a week - switch to low-fat or skim milk - switch to sugar-free drinks    Why is this important?   When you are ready to manage your nutrition or weight, having a plan and setting goals will help.  Taking small steps to change how you eat and exercise is a good place to start.    Notes:      RNCM:Prevent Falls and Injury   On track    Timeframe:  Long-Range Goal Priority:  Medium Start Date:      07/08/21                       Expected End Date:  10/19/21                    Follow Up Date 08/07/21    - always use handrails on the stairs - always wear low-heeled or flat shoes or slippers with nonskid soles - keep a flashlight by the bed - keep my cell phone with me always - make an emergency alert plan in case I fall - pick up clutter from the floors - use a nonslip pad with throw rugs, or remove them completely - use a cane or walker - use a nightlight in the bathroom - wear my glasses and/or hearing aid    Why is this important?   Most falls happen when it is hard for you to walk safely. Your balance may be off because of an illness. You may have pain in your knees, hip or other joints.  You may be overly tired or taking medicines that make you sleepy. You may not be able to see or hear clearly.  Falls can lead to broken bones,  bruises or other injuries.  There are things you can do to help prevent falling.     Notes:      RNCM:Track and Manage My Blood Pressure-Hypertension   On track    Timeframe:  Long-Range Goal Priority:  High Start Date:        05/15/21                     Expected End Date:     10/19/21                  Follow Up Date 08/07/21    - check blood pressure 3 times per week - choose a place to take  my blood pressure (home, clinic or office, retail store) - write blood pressure results in a log or diary    Why is this important?   You won't feel high blood pressure, but it can still hurt your blood vessels.  High blood pressure can cause heart or kidney problems. It can also cause a stroke.  Making lifestyle changes like losing a little weight or eating less salt will help.  Checking your blood pressure at home and at different times of the day can help to control blood pressure.  If the doctor prescribes medicine remember to take it the way the doctor ordered.  Call the office if you cannot afford the medicine or if there are questions about it.     Notes:       Understanding Your Risk for Falls Each year, millions of people have serious injuries from falls. It is important to understand your risk for falling. Talk with your health care provider about your risk and what you can do to lower it. There are actions you can take at home to lower your risk and prevent falls. If you do have a serious fall, make sure to tell your health care provider. Falling once raises your risk of falling again. How can falls affect me? Serious injuries from falls are common. These include: Broken bones, such as hip fractures. Head injuries, such as traumatic brain injuries (TBI) or concussion. A fear of falling can cause you to avoid activities and stay at home. This can make your muscles weaker and actually raise your risk for a fall. What can increase my risk? There are a number of risk factors that  increase your risk for falling. The more risk factors you have, the higher your risk of falling. Serious injuries from a fall happen most often to people older than age 18. Children and young adults ages 83-29 are also at higher risk. Common risk factors include: Weakness in the lower body. Lack (deficiency) of vitamin D. Being generally weak or confused due to long-term (chronic) illness. Dizziness or balance problems. Poor vision. Medicines that cause dizziness or drowsiness. These can include medicines for your blood pressure, heart, anxiety, insomnia, or edema, as well as pain medicines and muscle relaxants. Other risk factors include: Drinking alcohol. Having had a fall in the past. Having depression. Having foot pain or wearing improper footwear. Working at a dangerous job. Having any of the following in your home: Tripping hazards, such as floor clutter or loose rugs. Poor lighting. Pets. Having dementia or memory loss. What actions can I take to lower my risk of falling?   Physical activity Maintain physical fitness. Do strength and balance exercises. Consider taking a regular class to build strength and balance. Yoga and tai chi are good options. Vision Have your eyes checked every year and your vision prescription updated as needed. Walking aids and footwear Wear nonskid shoes. Do not wear high heels. Do not walk around the house in socks or slippers. Use a cane or walker as told by your health care provider. Home safety Attach secure railings on both sides of your stairs. Install grab bars for your tub, shower, and toilet. Use a bath mat in your tub or shower. Use good lighting in all rooms. Keep a flashlight near your bed. Make sure there is a clear path from your bed to the bathroom. Use night-lights. Do not use throw rugs. Make sure all carpeting is taped or tacked down securely. Remove all clutter from  walkways and stairways, including extension cords. Repair uneven  or broken steps. Avoid walking on icy or slippery surfaces. Walk on the grass instead of on icy or slick sidewalks. Use ice melt to get rid of ice on walkways. Use a cordless phone. Questions to ask your health care provider Can you help me check my risk for a fall? Do any of my medicines make me more likely to fall? Should I take a vitamin D supplement? What exercises can I do to improve my strength and balance? Should I make an appointment to have my vision checked? Do I need a bone density test to check for weak bones or osteoporosis? Would it help to use a cane or a walker? Where to find more information Centers for Disease Control and Prevention, STEAD[employer]: http://www.wolf.info/ Community-Based Fall Prevention Programs: http://www.wolf.info/ National Institute on Aging: http://kim-miller.com/ Contact a health care provider if: You fall at home. You are afraid of falling at home. You feel weak, drowsy, or dizzy. Summary Serious injuries from a fall happen most often to people older than age 4. Children and young adults ages 9-29 are also at higher risk. Talk with your health care provider about your risks for falling and how to lower those risks. Taking certain precautions at home can lower your risk for falling. If you fall, always tell your health care provider. This information is not intended to replace advice given to you by your health care provider. Make sure you discuss any questions you have with your health care provider. Document Revised: 05/08/2020 Document Reviewed: 05/08/2020 Elsevier Patient Education  2022 [employer].   Patient verbalizes understanding of instructions provided today and agrees to view in [place].   Telephone follow up appointment with care management team member scheduled for:  Peter Garter RN, [facility], CDE Care Management Coordinator [place] [phone], Mobile [phone]

## 2021-07-10 DIAGNOSIS — M545 Low back pain, unspecified: Secondary | ICD-10-CM | POA: Diagnosis not present

## 2021-07-10 DIAGNOSIS — M5416 Radiculopathy, lumbar region: Secondary | ICD-10-CM | POA: Diagnosis not present

## 2021-07-18 DIAGNOSIS — M1711 Unilateral primary osteoarthritis, right knee: Secondary | ICD-10-CM | POA: Diagnosis not present

## 2021-07-18 DIAGNOSIS — I10 Essential (primary) hypertension: Secondary | ICD-10-CM

## 2021-07-18 DIAGNOSIS — E119 Type 2 diabetes mellitus without complications: Secondary | ICD-10-CM

## 2021-07-22 DIAGNOSIS — S42001D Fracture of unspecified part of right clavicle, subsequent encounter for fracture with routine healing: Secondary | ICD-10-CM | POA: Diagnosis not present

## 2021-07-28 DIAGNOSIS — M5451 Vertebrogenic low back pain: Secondary | ICD-10-CM | POA: Diagnosis not present

## 2021-08-05 DIAGNOSIS — Z23 Encounter for immunization: Secondary | ICD-10-CM | POA: Diagnosis not present

## 2021-08-06 ENCOUNTER — Telehealth: Payer: Self-pay | Admitting: *Deleted

## 2021-08-06 MED ORDER — RIVAROXABAN 20 MG PO TABS: 20.0000 mg | ORAL_TABLET | Freq: Every day | ORAL | 0 refills | Status: DC

## 2021-08-06 NOTE — Telephone Encounter (Signed)
Patient requesting a refill of rivaroxaban (XARELTO) 20 MG TABS tablet Patient is having surgery at the end of the month.  She will stop the medication a couple of days before surgery. Advised patient to follow up with the surgeons office to see if she should continue the medication after surgery. Samples are available for the patient.

## 2021-08-07 ENCOUNTER — Ambulatory Visit (INDEPENDENT_AMBULATORY_CARE_PROVIDER_SITE_OTHER): Payer: Medicare Other

## 2021-08-07 DIAGNOSIS — E119 Type 2 diabetes mellitus without complications: Secondary | ICD-10-CM

## 2021-08-07 DIAGNOSIS — I10 Essential (primary) hypertension: Secondary | ICD-10-CM

## 2021-08-07 DIAGNOSIS — W19XXXD Unspecified fall, subsequent encounter: Secondary | ICD-10-CM

## 2021-08-07 DIAGNOSIS — M1711 Unilateral primary osteoarthritis, right knee: Secondary | ICD-10-CM

## 2021-08-07 NOTE — Patient Instructions (Signed)
Visit Information  PATIENT GOALS:  Goals Addressed             This Visit's Progress    RNCM:Eat Healthy   On track    Timeframe:  Long-Range Goal Priority:  Medium Start Date:           05/15/21                  Expected End Date:  10/19/21                    Follow Up Date 09/09/21    - change to whole grain breads, cereal, pasta - set goal weight - eat fish at least once per week - fill half of plate with vegetables - limit fast food meals to no more than 1 per week - manage portion size - prepare main meal at home 3 to 5 days each week - read food labels for fat, fiber, carbohydrates and portion size - reduce red meat to 2 to 3 times a week - switch to low-fat or skim milk - switch to sugar-free drinks    Why is this important?   When you are ready to manage your nutrition or weight, having a plan and setting goals will help.  Taking small steps to change how you eat and exercise is a good place to start.    Notes:      RNCM:Prevent Falls and Injury   On track    Timeframe:  Long-Range Goal Priority:  Medium Start Date:      07/08/21                       Expected End Date:  10/19/21                    Follow Up Date 09/09/21    - always use handrails on the stairs - always wear low-heeled or flat shoes or slippers with nonskid soles - keep a flashlight by the bed - keep my cell phone with me always - make an emergency alert plan in case I fall - pick up clutter from the floors - use a nonslip pad with throw rugs, or remove them completely - use a cane or walker - use a nightlight in the bathroom - wear my glasses and/or hearing aid    Why is this important?   Most falls happen when it is hard for you to walk safely. Your balance may be off because of an illness. You may have pain in your knees, hip or other joints.  You may be overly tired or taking medicines that make you sleepy. You may not be able to see or hear clearly.  Falls can lead to broken bones,  bruises or other injuries.  There are things you can do to help prevent falling.     Notes:      RNCM:Track and Manage My Blood Pressure-Hypertension   On track    Timeframe:  Long-Range Goal Priority:  High Start Date:        05/15/21                     Expected End Date:     10/19/21                  Follow Up Date 09/09/21    - check blood pressure 3 times per week - choose a place to take  my blood pressure (home, clinic or office, retail store) - write blood pressure results in a log or diary    Why is this important?   You won't feel high blood pressure, but it can still hurt your blood vessels.  High blood pressure can cause heart or kidney problems. It can also cause a stroke.  Making lifestyle changes like losing a little weight or eating less salt will help.  Checking your blood pressure at home and at different times of the day can help to control blood pressure.  If the doctor prescribes medicine remember to take it the way the doctor ordered.  Call the office if you cannot afford the medicine or if there are questions about it.     Notes:         Patient verbalizes understanding of instructions provided today and agrees to view in MyChart.   Telephone follow up appointment with care management team member scheduled for: 09/09/21 at 11:30 AM Dudley Major RN, Maximiano Coss, CDE Care Management Coordinator  [facility] [phone], Mobile [phone]

## 2021-08-07 NOTE — Chronic Care Management (AMB) (Signed)
Chronic Care Management   CCM RN Visit Note  08/07/2021 Name: Kristy [name] MRN: [id number] DOB: 1941-04-19  Subjective: Kristy [name] is a 80 y.o. year old female who is a primary care patient of Isaac Bliss, Rayford Halsted, MD. The care management team was consulted for assistance with disease management and care coordination needs.    Engaged with patient by telephone for follow up visit in response to provider referral for case management and/or care coordination services.   Consent to Services:  The patient was given information about Chronic Care Management services, agreed to services, and gave verbal consent prior to initiation of services.  Please see initial visit note for detailed documentation.   Patient agreed to services and verbal consent obtained.   Assessment: Review of patient past medical history, allergies, medications, health status, including review of consultants reports, laboratory and other test data, was performed as part of comprehensive evaluation and provision of chronic care management services.   SDOH (Social Determinants of Health) assessments and interventions performed:    CCM Care Plan  Allergies  Allergen Reactions   Clonidine Hives   Tramadol Itching    Outpatient Encounter Medications as of 08/07/2021  Medication Sig   albuterol (VENTOLIN HFA) 108 (90 Base) MCG/ACT inhaler INHALE 2 PUFFS BY MOUTH EVERY 6 HOURS AS NEEDED FOR WHEEZE (Patient taking differently: Inhale 2 puffs into the lungs every 6 (six) hours as needed for shortness of breath or wheezing.)   amLODipine (NORVASC) 10 MG tablet TAKE 1 TABLET BY MOUTH EVERY DAY   benazepril (LOTENSIN) 40 MG tablet TAKE 1 TABLET BY MOUTH EVERY DAY   celecoxib (CELEBREX) 200 MG capsule TAKE 1 CAPSULE BY MOUTH EVERY DAY   fluticasone (FLONASE) 50 MCG/ACT nasal spray SPRAY 2 SPRAYS INTO EACH NOSTRIL EVERY DAY (Patient taking differently: Place 2 sprays into both nostrils daily as needed for allergies.)    hydrochlorothiazide (HYDRODIURIL) 12.5 MG tablet TAKE 1 TABLET (12.5 MG TOTAL) BY MOUTH AS NEEDED. FOR SWELLING   HYDROcodone-acetaminophen (NORCO) 5-325 MG tablet Take 1 tablet by mouth every 6 (six) hours as needed for moderate pain.   metFORMIN (GLUCOPHAGE) 500 MG tablet Take 1 tablet (500 mg total) by mouth at bedtime.   metoprolol succinate (TOPROL-XL) 50 MG 24 hr tablet TAKE 1 TABLET BY MOUTH EVERY DAY   oxyCODONE (OXY IR/ROXICODONE) 5 MG immediate release tablet Take 1-2 tablets (5-10 mg total) by mouth every 6 (six) hours as needed for severe pain.   rivaroxaban (XARELTO) 20 MG TABS tablet Take 1 tablet (20 mg total) by mouth daily with supper.   traMADol (ULTRAM) 50 MG tablet TAKE 1 TABLET (50 MG TOTAL) BY MOUTH EVERY 12 (TWELVE) HOURS AS NEEDED. (Patient taking differently: Take 50 mg by mouth every 12 (twelve) hours as needed (PAIN.).)   WIXELA INHUB 250-50 MCG/DOSE AEPB INHALE 1 PUFF BY MOUTH EVERY 12 HOURS (Patient taking differently: Inhale 1 puff into the lungs 2 (two) times daily as needed (congestion/difficulty breathing.).)   No facility-administered encounter medications on file as of 08/07/2021.    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) 02/28/2021   Osteoarthritis of right knee 12/09/2020   Hypokalemia 03/07/2020   Type 2 diabetes mellitus without complication ([place]) 93/81/0175   IGT (impaired glucose tolerance) 03/14/2019   Acute labyrinthitis, unspecified laterality 09/08/2018   Trimalleolar fracture of right ankle 06/24/2018   Closed trimalleolar fracture of right ankle 06/21/2018  OA (osteoarthritis) of knee 09/22/2012   UNSPECIFIED DISORDER TEETH&SUPPORTING STRUCTURES 09/09/2010   Essential hypertension 04/07/2007   Allergic rhinitis 04/07/2007   Asthma 04/07/2007   GERD 04/07/2007   COLONIC POLYPS, HX OF 04/07/2007    Conditions to be addressed/monitored:HTN, DMII, Osteoarthritis,  and hx of fall with injury  Care Plan : RNCM:Hypertension (Adult)  Updates made by Dimitri Ped, RN since 08/07/2021 12:00 AM     Problem: Lack of long term self management of Hypertension   Priority: High     Long-Range Goal: Effective long term self mangament of Hypertension   Start Date: 05/15/2021  Expected End Date: 10/19/2021  This Visit's Progress: On track  Recent Progress: On track  Priority: High  Note:   Current Barriers:  Knowledge Deficits related to basic understanding of hypertension pathophysiology and self care management with hx of DM2, asthma,hx of  DVT Unable to independently self manage hypertension Pt states that she her B/P was 120/68 yesterday Readings at home are ranging from 120/60-130/72    States she tries to eat healthy and does not add salt to her cooking or food.  States she is getting pharmacy assistance on her  Xarelto now.  States she is not able to exercise since her fall but will start therapy after her back injection. Nurse Case Manager Clinical Goal(s):  patient will verbalize understanding of plan for hypertension management patient will not experience hospital admission. Hospital Admissions in last 6 months = 1 planned rt total knee replacement patient will attend all scheduled medical appointments: Dr Jerilee Hoh 09/03/21 patient will demonstrate improved adherence to prescribed treatment plan for hypertension as evidenced by taking all medications as prescribed, monitoring and recording blood pressure as directed, adhering to low sodium/DASH diet patient will demonstrate improved health management independence as evidenced by checking blood pressure as directed and notifying PCP if SBP>160 or DBP > 90, taking all medications as prescribe, and adhering to a low sodium diet as discussed. patient will verbalize basic understanding of hypertension disease process and self health management plan as evidenced by readings within limits, medication  adherence  Interventions:  Collaboration with Isaac Bliss, Rayford Halsted, MD regarding development and update of comprehensive plan of care as evidenced by provider attestation and co-signature Inter-disciplinary care team collaboration (see longitudinal plan of care) Evaluation of current treatment plan related to hypertension self management and patient's adherence to plan as established by provider. Reinforced education to patient re: stroke prevention, s/s of heart attack and stroke, DASH diet, complications of uncontrolled blood pressure Reviewed medications with patient and discussed importance of compliance Discussed plans with patient for ongoing care management follow up and provided patient with direct contact information for care management team Reinforced to monitor blood pressure three times a week and record, calling PCP for findings outside established parameters.  Reviewed scheduled/upcoming provider appointments including: Dr.Hernandez 09/03/21 Reviewed to try to be as active as her pain allows  Self-Care Activities:  Self administers medications as prescribed Attends all scheduled provider appointments Calls pharmacy for medication refills Attends church or other social activities Calls provider office for new concerns or questions Patient Goals  - Self administer medications as prescribed  - Attend all scheduled provider appointments  - Call provider office for new concerns, questions, or BP outside discussed parameters  - Check BP and record as discussed  - Follows a low sodium diet/DASH diet - check blood pressure 3 times per week - choose a place to take my blood pressure (home, clinic  or office, retail store) - write blood pressure results in a log or diary - ask questions to understand Follow Up Plan: Telephone follow up appointment with care management team member scheduled for: 09/09/21 11:30 AM The patient has been provided with contact information for the care  management team and has been advised to call with any health related questions or concerns.      Care Plan : RNCM:Diabetes Type 2 (Adult)  Updates made by Dimitri Ped, RN since 08/07/2021 12:00 AM     Problem: Disease Progression (Diabetes, Type 2)   Priority: Medium     Long-Range Goal: Disease Progression Prevented or Minimized   Start Date: 05/15/2021  Expected End Date: 10/19/2021  This Visit's Progress: On track  Recent Progress: On track  Priority: Medium  Note:   Objective:  Lab Results  Component Value Date   HGBA1C 5.5 02/28/2021   Lab Results  Component Value Date   CREATININE 0.69 12/10/2020   CREATININE 0.65 11/27/2020   CREATININE 0.81 03/15/2020   No results found for: EGFR Current Barriers:  Knowledge Deficits related to basic Diabetes pathophysiology and self care/management Unable to independently self manage type 2 DM States she tries to follow a low CHO low sodium diet.  States has not been able to exercise since her fall.  She does not check CBG since her sugars are well controlled Case Manager Clinical Goal(s):  patient will demonstrate improved adherence to prescribed treatment plan for diabetes self care/management as evidenced by: adherence to ADA/ carb modified diet exercise 3 days/week adherence to prescribed medication regimen contacting provider for new or worsened symptoms or questions Interventions:  Collaboration with Isaac Bliss, Rayford Halsted, MD regarding development and update of comprehensive plan of care as evidenced by provider attestation and co-signature Inter-disciplinary care team collaboration (see longitudinal plan of care) Reinforced  education to patient about basic DM disease process Reviewed medications with patient and discussed importance of medication adherence Discussed plans with patient for ongoing care management follow up and provided patient with direct contact information for care management team Reviewed  scheduled/upcoming provider appointments including: Dr. Jerilee Hoh 09/03/21 Review of patient status, including review of consultants reports, relevant laboratory and other test results, and medications completed. Reinforced to follow a low carbohydrate low salt diet and to watch portion sizes Reviewed how the stress of her injury and the steroids can make her blood sugar higher and reinforced to continue to eat healthy Self-Care Activities - Self administers oral medications as prescribed Attends all scheduled provider appointments Adheres to prescribed ADA/carb modified Patient Goals: - change to whole grain breads, cereal, pasta - drink 6 to 8 glasses of water each day - eat fish at least once per week - fill half of plate with vegetables - limit fast food meals to no more than 1 per week - manage portion size - prepare main meal at home 3 to 5 days each week - read food labels for fat, fiber, carbohydrates and portion size - reduce red meat to 2 to 3 times a week - switch to low-fat or skim milk - switch to sugar-free drinks - keep appointment with eye doctor - check feet daily for cuts, sores or redness - trim toenails straight across - wash and dry feet carefully every day - wear comfortable, cotton socks - wear comfortable, well-fitting shoes Follow Up Plan: Telephone follow up appointment with care management team member scheduled for: 09/09/21 at 11:30 AM The patient has been provided with contact  information for the care management team and has been advised to call with any health related questions or concerns.      Care Plan : RNCM:Fall Risk (Adult)  Updates made by Dimitri Ped, RN since 08/07/2021 12:00 AM     Problem: Fall Risk   Priority: Medium     Long-Range Goal: Absence of Fall and Fall-Related Injury   Start Date: 07/08/2021  Expected End Date: 10/19/2021  This Visit's Progress: On track  Recent Progress: On track  Priority: Medium  Note:   Current  Barriers:  Knowledge Deficits related to fall precautions in patient with osteoarthritis and hx fall with fx rt clavicle and injured lower back Unable to independently self manage fall precautions Chronic Disease Management support and education needs related to self management of fall precautions Hx of fall down 9 stairs while she was on vacation at [facility].  States she fx her rt collar bone and injured her back.  States she is to have an injection in her back on 08/21/21 and she may need to have her spine near her bra line cemented for a hairline crack if she does not improve.  States she is to hold her Xarelto for 2 days before her injection and will restart when they tell her after the procedure.  States she is still  having pain in her lower back that she is taking pain medication, muscle relaxer's and using heat to help relieve the pain.  Denies any more falls. States she uses her cane if needed when she goes out Clinical Goal(s):  patient will demonstrate improved adherence to prescribed treatment plan for decreasing falls as evidenced by patient reporting and review of EMR patient will verbalize using fall risk reduction strategies discussed patient will not experience additional falls patient will verbalize understanding of plan for self management of fall precautions patient will take all medications exactly as prescribed and will call provider for medication related questions patient will attend all scheduled medical appointments: Dr. Jerilee Hoh 09/03/21 patient will demonstrate improved health management independence Interventions:  Collaboration with Isaac Bliss, Rayford Halsted, MD regarding development and update of comprehensive plan of care as evidenced by provider attestation and co-signature Inter-disciplinary care team collaboration (see longitudinal plan of care) Provided written and verbal education re: Potential causes of falls and Fall prevention strategies Reviewed  medications and discussed potential side effects of medications such as dizziness and frequent urination Assessed for s/s of orthostatic hypotension Assessed for falls since last encounter. Assessed patients knowledge of fall risk prevention secondary to previously provided education. Provided patient information for fall alert systems Evaluation of current treatment plan related to self management of fall precautions and patient's adherence to plan as established by provider. Reviewed medications with patient and discussed adherence to plan of care Reviewed scheduled/upcoming provider appointments including:  Discussed plans with patient for ongoing care management follow up and provided patient with direct contact information for care management team Reviewed that she might have physical therapy after her injection to help with her strength and balance Self-Care Deficits:  Unable to independently self management of fall precautions Patient Goals:  - Utilize cane (assistive device) appropriately with all ambulation - De-clutter walkways - Change positions slowly - Wear secure fitting shoes at all times with ambulation - Utilize home lighting for dim lit areas - Demonstrate self and pet awareness at all times - always use handrails on the stairs - always wear low-heeled or flat shoes or slippers with nonskid soles -  keep a flashlight by the bed - keep my cell phone with me always - make an emergency alert plan in case I fall - pick up clutter from the floors - use a nonslip pad with throw rugs, or remove them completely - use a cane or walker - use a nightlight in the bathroom - wear my glasses and/or hearing aid Follow Up Plan: Telephone follow up appointment with care management team member scheduled for: 09/09/21 at 11:30 AM The patient has been provided with contact information for the care management team and has been advised to call with any health related questions or concerns.        Plan:Telephone follow up appointment with care management team member scheduled for:  09/09/21 The patient has been provided with contact information for the care management team and has been advised to call with any health related questions or concerns.  Peter Garter RN, Jackquline Denmark, CDE Care Management Coordinator [facility] [phone], Mobile [phone]

## 2021-08-08 ENCOUNTER — Telehealth: Payer: Self-pay | Admitting: Internal Medicine

## 2021-08-08 NOTE — Telephone Encounter (Signed)
Call from [facility] stated pt need a refill on rivaroxaban (XARELTO) 20 MG TABS tablet  sent .

## 2021-08-08 NOTE — Telephone Encounter (Signed)
Patient has samples and will discuss if a refill is needed at her upcoming appointment.

## 2021-08-18 DIAGNOSIS — M1711 Unilateral primary osteoarthritis, right knee: Secondary | ICD-10-CM | POA: Diagnosis not present

## 2021-08-18 DIAGNOSIS — I10 Essential (primary) hypertension: Secondary | ICD-10-CM

## 2021-08-18 DIAGNOSIS — E119 Type 2 diabetes mellitus without complications: Secondary | ICD-10-CM

## 2021-08-21 DIAGNOSIS — M5416 Radiculopathy, lumbar region: Secondary | ICD-10-CM | POA: Diagnosis not present

## 2021-08-21 DIAGNOSIS — M170 Bilateral primary osteoarthritis of knee: Secondary | ICD-10-CM | POA: Diagnosis not present

## 2021-09-01 ENCOUNTER — Other Ambulatory Visit: Payer: Self-pay

## 2021-09-01 ENCOUNTER — Ambulatory Visit
Admission: EM | Admit: 2021-09-01 | Discharge: 2021-09-01 | Disposition: A | Discharge status: Home / Self Care | Payer: Medicare Other | Attending: Emergency Medicine | Admitting: Emergency Medicine

## 2021-09-01 DIAGNOSIS — J36 Peritonsillar abscess: Secondary | ICD-10-CM | POA: Diagnosis not present

## 2021-09-01 MED ORDER — DEXAMETHASONE SODIUM PHOSPHATE 10 MG/ML IJ SOLN
20.0000 mg | Freq: Once | INTRAMUSCULAR | Status: AC
  Administered 2021-09-01: 20 mg via INTRAMUSCULAR

## 2021-09-01 MED ORDER — CEFTRIAXONE SODIUM 1 G IJ SOLR
2.0000 g | Freq: Once | INTRAMUSCULAR | Status: AC
  Administered 2021-09-01: 2 g via INTRAMUSCULAR

## 2021-09-01 MED ORDER — AMOXICILLIN-POT CLAVULANATE 875-125 MG PO TABS: 1.0000 | ORAL_TABLET | Freq: Two times a day (BID) | ORAL | 0 refills | Status: DC

## 2021-09-01 NOTE — Discharge Instructions (Addendum)
You were treated with an injection of Rocephin, a powerful antibiotic that will eliminate a lot of the bacteria causing your infection in your throat within the next 6 to 12 hours.  You also received an injection of dexamethasone, a steroid which should significantly decrease any inflammation in your face as well as in your trachea, this should improve your work of breathing, eliminate the wheeze that you are hearing and begin to help you feel better within the next 2 to 4 hours.  As we discussed, based on my physical exam findings and the history provided to me today including the rapid onset of your symptoms, I would like for you to have a very low threshold for going to the emergency room.  If you do not have any improvement at all or begin to have worsening symptoms by 10:00 tonight, please go straight to the emergency room.  If you become acutely short of breath, do not attempt to drive to the emergency room or allow anyone else to drive you to the emergency room.  It is very important that you call 911 and bring the emergency providers to you.  Please follow-up with your primary care provider tomorrow for reevaluation of your symptoms to ensure that you are making progress in resolving this issue.

## 2021-09-01 NOTE — ED Provider Notes (Signed)
[facility]    CSN: [id number] Arrival date & time: 09/01/21  1354   History   Chief Complaint No chief complaint on file.  HPI Kristy [name] is a 80 y.o. female. Pt reports swelling to left jaw that appeared this morning.  Patient reports of history of asthma, states she has been using her inhaler today with no benefit. Per my observation, patient appears very uncomfortable and upper airway wheezing is audible from across the room.  Patient denies fever, aches, chills, nausea, vomiting, diarrhea.  Blood pressure today is very elevated.  Patient states has not taken her blood pressure today because she is having difficulty swallowing secondary to pain.  Patient denies difficulty maintaining her airway or secretions.  The history is provided by the patient.   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   Osteoarthritis of right knee 12/09/2020   Hypokalemia 03/07/2020   Type 2 diabetes mellitus without complication (HCC) 03/07/2020   IGT (impaired glucose tolerance) 03/14/2019   Acute labyrinthitis, unspecified laterality 09/08/2018   Trimalleolar fracture of right ankle 06/24/2018   Closed trimalleolar fracture of right ankle 06/21/2018   OA (osteoarthritis) of knee 09/22/2012   UNSPECIFIED DISORDER TEETH&SUPPORTING STRUCTURES 09/09/2010   Essential hypertension 04/07/2007   Allergic rhinitis 04/07/2007   Asthma 04/07/2007   GERD 04/07/2007   COLONIC POLYPS, HX OF 04/07/2007   Past Surgical History:  Procedure Laterality Date   BACK  SURGERY     x3  ruptured disc    CHOLECYSTECTOMY OPEN  AGE 11   EYE SURGERY     bil cataracts   HERNIA REPAIR     INGUINAL HERNIA REPAIR Right AGE 11s   KNEE ARTHROSCOPY Left 2014   LUMBAR DISC SURGERY  x2  LAST DATE EARLY 2000s   ORIF ANKLE FRACTURE Right 06/24/2018   Procedure: OPEN REDUCTION INTERNAL FIXATION (ORIF) RIGHT ANKLE FRACTURE;  Surgeon: Kathryne Hitch, MD;  Location: [facility];  Service: Orthopedics;  Laterality: Right;   ROTATOR CUFF REPAIR Left 01/2015   TONSILLECTOMY  AGE 39   TOTAL KNEE ARTHROPLASTY Right 12/09/2020   Procedure: TOTAL KNEE ARTHROPLASTY;  Surgeon: Ollen Gross, MD;  Location: [facility];  Service: Orthopedics;  Laterality: Right;    VAGINAL HYSTERECTOMY  AGE 84   WITH BSO   OB History   No obstetric history on file.    Home Medications    Prior to Admission medications   Medication Sig Start Date End Date Taking? Authorizing Provider  amoxicillin-clavulanate (AUGMENTIN) 875-125 MG tablet Take 1 tablet by mouth every 12 (twelve) hours for 10 days. 09/01/21 09/11/21 Yes Theadora Rama Scales, PA-C  albuterol (VENTOLIN HFA) 108 (90 Base) MCG/ACT inhaler INHALE 2 PUFFS BY MOUTH EVERY 6 HOURS AS NEEDED FOR WHEEZE Patient taking differently: Inhale 2 puffs into the lungs every 6 (six) hours as needed for shortness of breath or wheezing. 03/25/20   Philip Aspen, Limmie Patricia, MD  [facility])  10 MG tablet TAKE 1 TABLET BY MOUTH EVERY DAY 04/15/21   Isaac Bliss, Rayford Halsted, MD  benazepril (LOTENSIN) 40 MG tablet TAKE 1 TABLET BY MOUTH EVERY DAY 04/15/21   Isaac Bliss, Rayford Halsted, MD  celecoxib (CELEBREX) 200 MG capsule TAKE 1 CAPSULE BY MOUTH EVERY DAY 07/03/21   Isaac Bliss, Rayford Halsted, MD  fluticasone (FLONASE) 50 MCG/ACT nasal spray SPRAY 2 SPRAYS INTO EACH NOSTRIL EVERY DAY Patient taking differently: Place 2 sprays into both nostrils daily as needed for allergies. 06/09/19   Isaac Bliss, Rayford Halsted, MD  hydrochlorothiazide  (HYDRODIURIL) 12.5 MG tablet TAKE 1 TABLET (12.5 MG TOTAL) BY MOUTH AS NEEDED. FOR SWELLING 03/21/21   Troy Sine, MD  HYDROcodone-acetaminophen (NORCO) 5-325 MG tablet Take 1 tablet by mouth every 6 (six) hours as needed for moderate pain. 06/03/21   Isaac Bliss, Rayford Halsted, MD  metFORMIN (GLUCOPHAGE) 500 MG tablet Take 1 tablet (500 mg total) by mouth at bedtime. 02/28/21   Isaac Bliss, Rayford Halsted, MD  metoprolol succinate (TOPROL-XL) 50 MG 24 hr tablet TAKE 1 TABLET BY MOUTH EVERY DAY 02/20/21   Troy Sine, MD  oxyCODONE (OXY IR/ROXICODONE) 5 MG immediate release tablet Take 1-2 tablets (5-10 mg total) by mouth every 6 (six) hours as needed for severe pain. 12/10/20   Fenton Foy D, PA-C  rivaroxaban (XARELTO) 20 MG TABS tablet Take 1 tablet (20 mg total) by mouth daily with supper. 08/06/21   Isaac Bliss, Rayford Halsted, MD  traMADol (ULTRAM) 50 MG tablet TAKE 1 TABLET (50 MG TOTAL) BY MOUTH EVERY 12 (TWELVE) HOURS AS NEEDED. Patient taking differently: Take 50 mg by mouth every 12 (twelve) hours as needed (PAIN.). 08/02/20   Isaac Bliss, Rayford Halsted, MD  WIXELA INHUB 250-50 MCG/DOSE AEPB INHALE 1 PUFF BY MOUTH EVERY 12 HOURS Patient taking differently: Inhale 1 puff into the lungs 2 (two) times daily as needed (congestion/difficulty breathing.). 04/04/19   Isaac Bliss, Rayford Halsted, MD   Family History Family History  Problem Relation Age of Onset   Colon cancer Neg Hx    Esophageal cancer Neg Hx    Rectal cancer Neg Hx    Stomach cancer Neg Hx    Social History Social History   Tobacco Use   Smoking status: Former    Years: 10.00    Types: Cigarettes    Quit date: 10/19/1988    Years since quitting: 32.8   Smokeless tobacco: Never   Tobacco comments:    1 a day  Vaping Use   Vaping Use: Never used  Substance Use Topics   Alcohol use: Yes    Alcohol/week: 3.0 - 4.0 standard drinks    Types: 3 - 4 Glasses of wine per week    Comment: 2-3 times a week   Drug use:  Never   Allergies   Clonidine and Tramadol  Review of Systems Review of Systems Pertinent findings noted in history of present illness.   Physical Exam Triage Vital Signs ED Triage Vitals  Enc Vitals Group     BP 08/15/21 0827 (!) 147/82     Pulse Rate 08/15/21 0827 72     Resp 08/15/21 0827 18     Temp 08/15/21 0827 98.3 F (36.8 C)     Temp Source 08/15/21 0827 Oral     SpO2 08/15/21 0827 98 %     Weight --      Height --      Head Circumference --  Peak Flow --      Pain Score 08/15/21 0826 5     Pain Loc --      Pain Edu? --      Excl. in [place]? --    No data found.  Updated Vital Signs BP (!) 176/94 (BP Location: Left Arm) Comment: Provider made aware  Pulse 78   Temp 98.1 F (36.7 C) (Oral)   Resp 18   SpO2 96%   Visual Acuity Right Eye Distance:   Left Eye Distance:   Bilateral Distance:    Right Eye Near:   Left Eye Near:    Bilateral Near:     Physical Exam Vitals and nursing note reviewed.  Constitutional:      General: She is not in acute distress.    Appearance: Normal appearance. She is not ill-appearing.  HENT:     Head: Normocephalic and atraumatic.     Jaw: Swelling (Left) and pain on movement present.     Salivary Glands: Right salivary gland is not diffusely enlarged or tender. Left salivary gland is diffusely enlarged and tender.     Right Ear: Tympanic membrane, ear canal and external ear normal. No drainage. No middle ear effusion. There is no impacted cerumen. Tympanic membrane is not erythematous or bulging.     Left Ear: Tympanic membrane, ear canal and external ear normal. No drainage.  No middle ear effusion. There is no impacted cerumen. Tympanic membrane is not erythematous or bulging.     Nose: Nose normal. No nasal deformity, septal deviation, mucosal edema, congestion or rhinorrhea.     Right Turbinates: Not enlarged, swollen or pale.     Left Turbinates: Not enlarged, swollen or pale.     Right Sinus: No maxillary sinus  tenderness or frontal sinus tenderness.     Left Sinus: No maxillary sinus tenderness or frontal sinus tenderness.     Mouth/Throat:     Lips: Pink. No lesions.     Mouth: Mucous membranes are moist. No oral lesions.     Pharynx: Oropharynx is clear. Uvula midline. No posterior oropharyngeal erythema or uvula swelling.     Tonsils: No tonsillar exudate. 0 on the right. 0 on the left.  Eyes:     General: Lids are normal.        Right eye: No discharge.        Left eye: No discharge.     Extraocular Movements: Extraocular movements intact.     Conjunctiva/sclera: Conjunctivae normal.     Right eye: Right conjunctiva is not injected.     Left eye: Left conjunctiva is not injected.  Neck:     Trachea: Trachea and phonation normal.  Cardiovascular:     Rate and Rhythm: Normal rate and regular rhythm.     Pulses: Normal pulses.     Heart sounds: Normal heart sounds. No murmur heard.   No friction rub. No gallop.  Pulmonary:     Effort: Pulmonary effort is normal. No accessory muscle usage, prolonged expiration or respiratory distress.     Breath sounds: Normal breath sounds. No stridor, decreased air movement or transmitted upper airway sounds. No decreased breath sounds, wheezing, rhonchi or rales.     Comments: Breath sounds are completely normal however patient has significant audible wheezing without auscultation in her upper airway. Chest:     Chest wall: No tenderness.  Musculoskeletal:        General: Normal range of motion.     Cervical back: Normal  range of motion and neck supple. Normal range of motion.  Lymphadenopathy:     Cervical: Cervical adenopathy present.     Left cervical: Posterior cervical adenopathy present.  Skin:    General: Skin is warm and dry.     Findings: No erythema or rash.  Neurological:     General: No focal deficit present.     Mental Status: She is alert and oriented to person, place, and time.  Psychiatric:        Mood and Affect: Mood normal.         Behavior: Behavior normal.   UC Treatments / Results  Labs (all labs ordered are listed, but only abnormal results are displayed)  Labs Reviewed - No data to display  EKG  Radiology No results found.  Procedures Procedures (including critical care time)  Medications Ordered in UC Medications  cefTRIAXone (ROCEPHIN) injection 2 g (2 g Intramuscular Given 09/01/21 1631)  dexamethasone (DECADRON) injection 20 mg (20 mg Intramuscular Given 09/01/21 1631)    Initial Impression / Assessment and Plan / UC Course  I have reviewed the triage vital signs and the nursing notes.  Pertinent labs & imaging results that were available during my care of the patient were reviewed by me and considered in my medical decision making (see chart for details).      Physical exam findings are very concerning for inflammation of her trachea being caused by abscess near her tonsils.  Patient was provided with an injection of Decadron and Rocephin in the office today.  Patient was given a prescription for Augmentin that she should begin taking tonight.  Patient was given strict precautions to call 911 for acute shortness of breath despite treatment with antibiotics and steroids.  Final Clinical Impressions(s) / UC Diagnoses   Final diagnoses:  Peritonsillar abscess     Discharge Instructions      You were treated with an injection of Rocephin, a powerful antibiotic that will eliminate a lot of the bacteria causing your infection in your throat within the next 6 to 12 hours.  You also received an injection of dexamethasone, a steroid which should significantly decrease any inflammation in your face as well as in your trachea, this should improve your work of breathing, eliminate the wheeze that you are hearing and begin to help you feel better within the next 2 to 4 hours.  As we discussed, based on my physical exam findings and the history provided to me today including the rapid onset of your  symptoms, I would like for you to have a very low threshold for going to the emergency room.  If you do not have any improvement at all or begin to have worsening symptoms by 10:00 tonight, please go straight to the emergency room.  If you become acutely short of breath, do not attempt to drive to the emergency room or allow anyone else to drive you to the emergency room.  It is very important that you call 911 and bring the emergency providers to you.  Please follow-up with your primary care provider tomorrow for reevaluation of your symptoms to ensure that you are making progress in resolving this issue.     ED Prescriptions     Medication Sig Dispense Auth. Provider   amoxicillin-clavulanate (AUGMENTIN) 875-125 MG tablet Take 1 tablet by mouth every 12 (twelve) hours for 10 days. 20 tablet Lynden Oxford Scales, PA-C      PDMP not reviewed this encounter.  Disposition Upon Discharge:  Patient presented with an acute illness with associated systemic symptoms and significant discomfort requiring urgent management. In my opinion, this is a condition that a prudent lay person (someone who possesses an average knowledge of health and medicine) may potentially expect to result in complications if not addressed urgently such as respiratory distress, impairment of bodily function or dysfunction of bodily organs.   Routine symptom specific, illness specific and/or disease specific instructions were discussed with the patient and/or caregiver at length.   As such, the patient has been evaluated and assessed, work-up was performed and treatment was provided in alignment with urgent care protocols and evidence based medicine.  Patient/parent/caregiver has been advised that the patient may require follow up for further testing and treatment if the symptoms continue in spite of treatment, as clinically indicated and appropriate.  Patient/parent/caregiver has been advised to return to go to the Emergency  Department if new signs and symptoms develop, or if the current signs or symptoms continue to change or worsen for further workup, evaluation and treatment as clinically indicated and appropriate  The patient will follow up with their current PCP if and as advised. If the patient does not currently have a PCP we will assist them in obtaining one.   Patient/parent/caregiver verbalized understanding and agreement of plan as discussed.  All questions were addressed during visit.  Please see discharge instructions below for further details of plan.  Condition: stable for discharge home Home: take medications as prescribed; routine discharge instructions as discussed; follow up as advised.    Lynden Oxford Scales, PA-C 09/02/21 1332

## 2021-09-01 NOTE — ED Triage Notes (Signed)
Pt reports swelling to left jaw. Pt reports she does have asthma (wheezing noted). Patient states that she has used her inhaler today. Patient does not appear in respiratory distress at this time.  Started: Friday

## 2021-09-02 DIAGNOSIS — S42001D Fracture of unspecified part of right clavicle, subsequent encounter for fracture with routine healing: Secondary | ICD-10-CM | POA: Diagnosis not present

## 2021-09-03 ENCOUNTER — Emergency Department (HOSPITAL_BASED_OUTPATIENT_CLINIC_OR_DEPARTMENT_OTHER): Payer: Medicare Other

## 2021-09-03 ENCOUNTER — Encounter (HOSPITAL_BASED_OUTPATIENT_CLINIC_OR_DEPARTMENT_OTHER): Payer: Self-pay

## 2021-09-03 ENCOUNTER — Ambulatory Visit (INDEPENDENT_AMBULATORY_CARE_PROVIDER_SITE_OTHER): Payer: Medicare Other | Admitting: Internal Medicine

## 2021-09-03 ENCOUNTER — Emergency Department (HOSPITAL_BASED_OUTPATIENT_CLINIC_OR_DEPARTMENT_OTHER)
Admission: EM | Admit: 2021-09-03 | Discharge: 2021-09-03 | Disposition: A | Discharge status: Home / Self Care | Payer: Medicare Other | Source: Home / Self Care | Attending: Emergency Medicine | Admitting: Emergency Medicine

## 2021-09-03 ENCOUNTER — Encounter: Payer: Self-pay | Admitting: Internal Medicine

## 2021-09-03 ENCOUNTER — Other Ambulatory Visit: Payer: Self-pay

## 2021-09-03 VITALS — BP 140/80 | HR 75 | Temp 97.6°F | Wt 158.6 lb

## 2021-09-03 DIAGNOSIS — R0602 Shortness of breath: Secondary | ICD-10-CM

## 2021-09-03 DIAGNOSIS — J384 Edema of larynx: Secondary | ICD-10-CM | POA: Diagnosis not present

## 2021-09-03 DIAGNOSIS — K1121 Acute sialoadenitis: Secondary | ICD-10-CM | POA: Diagnosis not present

## 2021-09-03 DIAGNOSIS — Z96651 Presence of right artificial knee joint: Secondary | ICD-10-CM | POA: Insufficient documentation

## 2021-09-03 DIAGNOSIS — R221 Localized swelling, mass and lump, neck: Secondary | ICD-10-CM

## 2021-09-03 DIAGNOSIS — Z79899 Other long term (current) drug therapy: Secondary | ICD-10-CM | POA: Insufficient documentation

## 2021-09-03 DIAGNOSIS — Z66 Do not resuscitate: Secondary | ICD-10-CM | POA: Diagnosis not present

## 2021-09-03 DIAGNOSIS — Z7984 Long term (current) use of oral hypoglycemic drugs: Secondary | ICD-10-CM | POA: Insufficient documentation

## 2021-09-03 DIAGNOSIS — I10 Essential (primary) hypertension: Secondary | ICD-10-CM | POA: Insufficient documentation

## 2021-09-03 DIAGNOSIS — L0390 Cellulitis, unspecified: Secondary | ICD-10-CM | POA: Diagnosis not present

## 2021-09-03 DIAGNOSIS — L03221 Cellulitis of neck: Secondary | ICD-10-CM | POA: Diagnosis not present

## 2021-09-03 DIAGNOSIS — R490 Dysphonia: Secondary | ICD-10-CM | POA: Diagnosis not present

## 2021-09-03 DIAGNOSIS — J392 Other diseases of pharynx: Secondary | ICD-10-CM | POA: Diagnosis not present

## 2021-09-03 DIAGNOSIS — Z87891 Personal history of nicotine dependence: Secondary | ICD-10-CM | POA: Insufficient documentation

## 2021-09-03 DIAGNOSIS — E119 Type 2 diabetes mellitus without complications: Secondary | ICD-10-CM | POA: Insufficient documentation

## 2021-09-03 DIAGNOSIS — Z7901 Long term (current) use of anticoagulants: Secondary | ICD-10-CM | POA: Insufficient documentation

## 2021-09-03 DIAGNOSIS — I82511 Chronic embolism and thrombosis of right femoral vein: Secondary | ICD-10-CM | POA: Diagnosis not present

## 2021-09-03 DIAGNOSIS — J45909 Unspecified asthma, uncomplicated: Secondary | ICD-10-CM | POA: Insufficient documentation

## 2021-09-03 DIAGNOSIS — K111 Hypertrophy of salivary gland: Secondary | ICD-10-CM | POA: Diagnosis not present

## 2021-09-03 DIAGNOSIS — Z20822 Contact with and (suspected) exposure to covid-19: Secondary | ICD-10-CM | POA: Diagnosis not present

## 2021-09-03 DIAGNOSIS — R609 Edema, unspecified: Secondary | ICD-10-CM

## 2021-09-03 DIAGNOSIS — I6523 Occlusion and stenosis of bilateral carotid arteries: Secondary | ICD-10-CM | POA: Diagnosis not present

## 2021-09-03 LAB — CBC WITH DIFFERENTIAL/PLATELET
Abs Immature Granulocytes: 0.04 10*3/uL (ref 0.00–0.07)
Basophils Absolute: 0 10*3/uL (ref 0.0–0.1)
Basophils Relative: 0 %
Eosinophils Absolute: 0.1 10*3/uL (ref 0.0–0.5)
Eosinophils Relative: 0 %
HCT: 41.1 % (ref 36.0–46.0)
Hemoglobin: 13.5 g/dL (ref 12.0–15.0)
Immature Granulocytes: 0 %
Lymphocytes Relative: 20 %
Lymphs Abs: 3 10*3/uL (ref 0.7–4.0)
MCH: 30 pg (ref 26.0–34.0)
MCHC: 32.8 g/dL (ref 30.0–36.0)
MCV: 91.3 fL (ref 80.0–100.0)
Monocytes Absolute: 0.9 10*3/uL (ref 0.1–1.0)
Monocytes Relative: 6 %
Neutro Abs: 11.2 10*3/uL — ABNORMAL HIGH (ref 1.7–7.7)
Neutrophils Relative %: 74 %
Platelets: 361 10*3/uL (ref 150–400)
RBC: 4.5 MIL/uL (ref 3.87–5.11)
RDW: 13.2 % (ref 11.5–15.5)
WBC: 15.2 10*3/uL — ABNORMAL HIGH (ref 4.0–10.5)
nRBC: 0 % (ref 0.0–0.2)

## 2021-09-03 LAB — BASIC METABOLIC PANEL
Anion gap: 12 (ref 5–15)
BUN: 23 mg/dL (ref 8–23)
CO2: 28 mmol/L (ref 22–32)
Calcium: 10.4 mg/dL — ABNORMAL HIGH (ref 8.9–10.3)
Chloride: 103 mmol/L (ref 98–111)
Creatinine, Ser: 0.77 mg/dL (ref 0.44–1.00)
GFR, Estimated: >60 mL/min (ref >60)
Glucose, Bld: 104 mg/dL — ABNORMAL HIGH (ref 70–99)
Potassium: 3.5 mmol/L (ref 3.5–5.1)
Sodium: 143 mmol/L (ref 135–145)

## 2021-09-03 LAB — POCT GLYCOSYLATED HEMOGLOBIN (HGB A1C): Hemoglobin A1C: 5.8 % — AB (ref 4.0–5.6)

## 2021-09-03 MED ORDER — VANCOMYCIN HCL IN DEXTROSE 1-5 GM/200ML-% IV SOLN
1000.0000 mg | Freq: Once | INTRAVENOUS | Status: AC
  Administered 2021-09-03: 1000 mg via INTRAVENOUS
  Filled 2021-09-03: qty 200

## 2021-09-03 MED ORDER — DEXAMETHASONE SODIUM PHOSPHATE 4 MG/ML IJ SOLN
4.0000 mg | Freq: Once | INTRAMUSCULAR | Status: AC
  Administered 2021-09-03: 4 mg via INTRAMUSCULAR

## 2021-09-03 MED ORDER — SODIUM CHLORIDE 0.9 % IV SOLN: INTRAVENOUS | Status: DC

## 2021-09-03 MED ORDER — IOHEXOL 350 MG/ML SOLN
60.0000 mL | Freq: Once | INTRAVENOUS | Status: AC | PRN
  Administered 2021-09-03: 60 mL via INTRAVENOUS

## 2021-09-03 NOTE — Discharge Instructions (Addendum)
Continue taking your antibiotics.  Go to [facility] if you have any trouble breathing

## 2021-09-03 NOTE — ED Provider Notes (Signed)
[facility] EMERGENCY DEPT Provider Note   CSN: [id number] Arrival date & time: 09/03/21  1021     History Chief Complaint  Patient presents with   Shortness of Breath   neck swelling    Kristy [name] is a 80 y.o. female.  80 year old female presents with swelling to her neck time several days.  Seen in urgent care and treated with antibiotics as well as steroids and did improve her symptoms.  Symptoms returned again with more swelling to her anterior neck.  Noted some trouble swallowing.  Did have some wheezing.  Used her albuterol inhaler without relief.  Went to her doctor's office today and was sent here for further management.      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   Osteoarthritis of right knee 12/09/2020   Hypokalemia 03/07/2020   Type 2 diabetes mellitus without complication (HCC) 03/07/2020   IGT (impaired glucose tolerance) 03/14/2019   Acute labyrinthitis, unspecified laterality 09/08/2018   Trimalleolar fracture of right ankle 06/24/2018   Closed trimalleolar fracture of right ankle 06/21/2018   OA (osteoarthritis) of knee 09/22/2012   UNSPECIFIED DISORDER TEETH&SUPPORTING STRUCTURES 09/09/2010   Essential hypertension 04/07/2007   Allergic rhinitis 04/07/2007   Asthma 04/07/2007   GERD 04/07/2007   COLONIC POLYPS, HX OF 04/07/2007    Past Surgical History:  Procedure Laterality Date   BACK SURGERY     x3  ruptured disc    CHOLECYSTECTOMY OPEN  AGE 47   EYE SURGERY     bil cataracts   HERNIA  REPAIR     INGUINAL HERNIA REPAIR Right AGE 47s   KNEE ARTHROSCOPY Left 2014   LUMBAR DISC SURGERY  x2  LAST DATE EARLY 2000s   ORIF ANKLE FRACTURE Right 06/24/2018   Procedure: OPEN REDUCTION INTERNAL FIXATION (ORIF) RIGHT ANKLE FRACTURE;  Surgeon: Kathryne Hitch, MD;  Location: [facility];  Service: Orthopedics;  Laterality: Right;   ROTATOR CUFF REPAIR Left 01/2015   TONSILLECTOMY  AGE 51   TOTAL KNEE ARTHROPLASTY Right 12/09/2020   Procedure: TOTAL KNEE ARTHROPLASTY;  Surgeon: Ollen Gross, MD;  Location: [facility];  Service: Orthopedics;  Laterality: Right;    VAGINAL HYSTERECTOMY  AGE 19   WITH BSO     OB History   No obstetric history on file.     Family History  Problem Relation Age of Onset   Colon cancer Neg Hx    Esophageal cancer Neg Hx    Rectal cancer Neg Hx    Stomach cancer Neg Hx     Social History   Tobacco Use   Smoking status: Former    Years: 10.00    Types: Cigarettes    Quit date: 10/19/1988    Years since quitting: 32.8   Smokeless tobacco: Never   Tobacco comments:    1 a day  Vaping Use   Vaping Use: Never used  Substance Use Topics   Alcohol  use: Yes    Alcohol/week: 3.0 - 4.0 standard drinks    Types: 3 - 4 Glasses of wine per week    Comment: 2-3 times a week   Drug use: Never    Home Medications Prior to Admission medications   Medication Sig Start Date End Date Taking? Authorizing Provider  albuterol (VENTOLIN HFA) 108 (90 Base) MCG/ACT inhaler INHALE 2 PUFFS BY MOUTH EVERY 6 HOURS AS NEEDED FOR WHEEZE Patient taking differently: Inhale 2 puffs into the lungs every 6 (six) hours as needed for shortness of breath or wheezing. 03/25/20   Philip Aspen, Limmie Patricia, MD  amLODipine (NORVASC) 10 MG tablet TAKE 1 TABLET BY MOUTH EVERY DAY 04/15/21   Philip Aspen, Limmie Patricia, MD  amoxicillin-clavulanate (AUGMENTIN) 875-125 MG tablet Take 1 tablet by mouth every 12 (twelve) hours for 10 days. 09/01/21 09/11/21  Theadora Rama Scales,  PA-C  benazepril (LOTENSIN) 40 MG tablet TAKE 1 TABLET BY MOUTH EVERY DAY 04/15/21   Philip Aspen, Limmie Patricia, MD  celecoxib (CELEBREX) 200 MG capsule TAKE 1 CAPSULE BY MOUTH EVERY DAY 07/03/21   Philip Aspen, Limmie Patricia, MD  fluticasone (FLONASE) 50 MCG/ACT nasal spray SPRAY 2 SPRAYS INTO EACH NOSTRIL EVERY DAY Patient taking differently: Place 2 sprays into both nostrils daily as needed for allergies. 06/09/19   Philip Aspen, Limmie Patricia, MD  hydrochlorothiazide (HYDRODIURIL) 12.5 MG tablet TAKE 1 TABLET (12.5 MG TOTAL) BY MOUTH AS NEEDED. FOR SWELLING 03/21/21   Lennette Bihari, MD  HYDROcodone-acetaminophen (NORCO) 5-325 MG tablet Take 1 tablet by mouth every 6 (six) hours as needed for moderate pain. 06/03/21   Philip Aspen, Limmie Patricia, MD  metFORMIN (GLUCOPHAGE) 500 MG tablet Take 1 tablet (500 mg total) by mouth at bedtime. 02/28/21   Philip Aspen, Limmie Patricia, MD  metoprolol succinate (TOPROL-XL) 50 MG 24 hr tablet TAKE 1 TABLET BY MOUTH EVERY DAY 02/20/21   Lennette Bihari, MD  oxyCODONE (OXY IR/ROXICODONE) 5 MG immediate release tablet Take 1-2 tablets (5-10 mg total) by mouth every 6 (six) hours as needed for severe pain. 12/10/20   Nelia Shi D, PA-C  rivaroxaban (XARELTO) 20 MG TABS tablet Take 1 tablet (20 mg total) by mouth daily with supper. 08/06/21   Philip Aspen, Limmie Patricia, MD  traMADol (ULTRAM) 50 MG tablet TAKE 1 TABLET (50 MG TOTAL) BY MOUTH EVERY 12 (TWELVE) HOURS AS NEEDED. Patient taking differently: Take 50 mg by mouth every 12 (twelve) hours as needed (PAIN.). 08/02/20   Philip Aspen, Limmie Patricia, MD  WIXELA INHUB 250-50 MCG/DOSE AEPB INHALE 1 PUFF BY MOUTH EVERY 12 HOURS Patient taking differently: Inhale 1 puff into the lungs 2 (two) times daily as needed (congestion/difficulty breathing.). 04/04/19   Philip Aspen, Limmie Patricia, MD    Allergies    Clonidine and Tramadol  Review of Systems   Review of Systems  All other systems reviewed and are  negative.  Physical Exam Updated Vital Signs Ht 1.549 m (5\' 1" )   Wt 71.7 kg   SpO2 96%   BMI 29.85 kg/m   Physical Exam Vitals and nursing note reviewed.  Constitutional:      General: She is not in acute distress.    Appearance: Normal appearance. She is well-developed. She is not toxic-appearing.  HENT:     Head: Normocephalic and atraumatic.  Eyes:     General: Lids are normal.     Conjunctiva/sclera: Conjunctivae normal.     Pupils: Pupils are equal, round, and reactive  to light.  Neck:     Thyroid: No thyroid mass.     Trachea: No tracheal deviation.     Comments: Anterior cervical fullness noted.  No stridor appreciated.  Posterior pharynx clear. left submandibular edema appreciated with slight erythema Cardiovascular:     Rate and Rhythm: Normal rate and regular rhythm.     Heart sounds: Normal heart sounds. No murmur heard.   No gallop.  Pulmonary:     Effort: Pulmonary effort is normal. No respiratory distress.     Breath sounds: Normal breath sounds. No stridor. No decreased breath sounds, wheezing, rhonchi or rales.  Abdominal:     General: There is no distension.     Palpations: Abdomen is soft.     Tenderness: There is no abdominal tenderness. There is no rebound.  Musculoskeletal:        General: No tenderness. Normal range of motion.     Cervical back: Normal range of motion and neck supple.  Skin:    General: Skin is warm and dry.     Findings: No abrasion or rash.  Neurological:     Mental Status: She is alert and oriented to person, place, and time. Mental status is at baseline.     GCS: GCS eye subscore is 4. GCS verbal subscore is 5. GCS motor subscore is 6.     Cranial Nerves: No cranial nerve deficit.     Sensory: No sensory deficit.     Motor: Motor function is intact.  Psychiatric:        Attention and Perception: Attention normal.        Speech: Speech normal.        Behavior: Behavior normal.    ED Results / Procedures / Treatments    Labs (all labs ordered are listed, but only abnormal results are displayed) Labs Reviewed  CBC WITH DIFFERENTIAL/PLATELET  BASIC METABOLIC PANEL    EKG None  Radiology No results found.  Procedures Procedures   Medications Ordered in ED Medications  0.9 %  sodium chloride infusion (has no administration in time range)    ED Course  I have reviewed the triage vital signs and the nursing notes.  Pertinent labs & imaging results that were available during my care of the patient were reviewed by me and considered in my medical decision making (see chart for details).    MDM Rules/Calculators/A&P                           Mild leukocytosis noted on patient CBC.  CT of the neck shows early cellulitis versus possible salivary duct stone.  Patient given dose of vancomycin here.  Patient already on Augmentin and will continue this.  Also give referral to ENT.  She has no signs of airway compromise.  Her voice is stable. Final Clinical Impression(s) / ED Diagnoses Final diagnoses:  None    Rx / DC Orders ED Discharge Orders     None        Lorre Nick, MD 09/03/21 1408

## 2021-09-03 NOTE — Addendum Note (Signed)
Addended by: Kern Reap B on: 09/03/2021 10:26 AM   Modules accepted: Orders

## 2021-09-03 NOTE — ED Triage Notes (Signed)
Onset Friday of neck swelling and seen for such and states it was getting better but since last night began swelling again.  Swelling noted to front left of neck.  States also feels SOB. On antibiotics and got a shot of steroids at the MD office this am.  States came here from MD office.

## 2021-09-03 NOTE — Progress Notes (Addendum)
Established Patient Office Visit     This visit occurred during the SARS-CoV-2 public health emergency.  Safety protocols were in place, including screening questions prior to the visit, additional usage of staff PPE, and extensive cleaning of exam room while observing appropriate contact time as indicated for disinfecting solutions.    CC/Reason for Visit: Follow-up diabetes, urgent care follow-up  HPI: Kristy [name] is a 80 y.o. female who is coming in today for the above mentioned reasons. 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  She visited the urgent care on 11/14 after the abrupt onset of a painful mass on her left jaw/upper neck.  No imaging was done during that visit.  She was given dexamethasone, Rocephin and sent home on Augmentin.  She is here today for follow-up.  She feels like the mass has not gotten smaller in size.  It is extremely painful to swallow.  She has a raspy voice which is new, she is visibly short of breath.  She denies fevers.   Past Medical/Surgical History: Past Medical History:  Diagnosis Date   Allergic rhinitis, seasonal    Arthritis    Asthma    bronchial with weather change   Diabetes mellitus without complication ([place])    type 2    Dysrhythmia    "Skipping a beat"   GERD (gastroesophageal reflux disease)    History of colon polyps    Hypertension    Pneumonia    Rotator cuff tear, right     Past Surgical History:  Procedure Laterality Date   BACK SURGERY     x3  ruptured disc    CHOLECYSTECTOMY OPEN  AGE 89   EYE SURGERY     bil cataracts   HERNIA REPAIR     INGUINAL HERNIA REPAIR Right AGE 89s   KNEE ARTHROSCOPY Left 2014   LUMBAR [place]  x2  LAST DATE EARLY 2000s   ORIF ANKLE FRACTURE Right 06/24/2018   Procedure: OPEN REDUCTION INTERNAL FIXATION (ORIF) RIGHT ANKLE FRACTURE;  Surgeon: Mcarthur Rossetti, MD;  Location: [facility];  Service: Orthopedics;  Laterality: Right;   ROTATOR CUFF REPAIR Left 01/2015   TONSILLECTOMY  AGE 22   TOTAL KNEE ARTHROPLASTY Right 12/09/2020   Procedure: TOTAL KNEE ARTHROPLASTY;  Surgeon: Gaynelle Arabian, MD;  Location: [facility];  Service: Orthopedics;  Laterality: Right;  44min   VAGINAL HYSTERECTOMY  AGE 21   WITH BSO    Social History:  reports that she quit smoking about 32 years ago. Her smoking use included cigarettes. She has never used smokeless tobacco. She reports current alcohol use of about 3.0 - 4.0 standard drinks per week. She reports that she does not use drugs.  Allergies: Allergies  Allergen Reactions   Clonidine Hives   Tramadol Itching    Family History:  Family History  Problem Relation Age of Onset   Colon cancer Neg Hx    Esophageal cancer Neg Hx    Rectal cancer Neg Hx    Stomach cancer Neg Hx      Current Outpatient Medications:    albuterol (VENTOLIN HFA) 108 (90 Base) MCG/ACT inhaler, INHALE 2 PUFFS BY MOUTH EVERY 6 HOURS AS NEEDED FOR WHEEZE (Patient taking differently: Inhale 2 puffs into the lungs every 6 (six) hours as needed for shortness of breath or wheezing.), Disp: 6.7 g, Rfl: 1   amLODipine (NORVASC)  10 MG tablet, TAKE 1 TABLET BY MOUTH EVERY DAY, Disp: 90 tablet, Rfl: 1   amoxicillin-clavulanate (AUGMENTIN) 875-125 MG tablet, Take 1 tablet by mouth every 12 (twelve) hours for 10 days., Disp: 20 tablet, Rfl: 0   benazepril (LOTENSIN) 40 MG tablet, TAKE 1 TABLET BY MOUTH EVERY DAY, Disp: 90 tablet, Rfl: 1   celecoxib (CELEBREX) 200 MG capsule, TAKE 1 CAPSULE BY MOUTH EVERY DAY, Disp: 90 capsule, Rfl: 0   fluticasone (FLONASE) 50 MCG/ACT nasal spray, SPRAY 2 SPRAYS INTO EACH NOSTRIL EVERY DAY (Patient taking differently: Place 2 sprays into both nostrils daily as needed for allergies.), Disp: 48 mL, Rfl: 0   hydrochlorothiazide (HYDRODIURIL) 12.5 MG tablet, TAKE 1 TABLET (12.5 MG TOTAL) BY MOUTH AS NEEDED.  FOR SWELLING, Disp: 90 tablet, Rfl: 3   HYDROcodone-acetaminophen (NORCO) 5-325 MG tablet, Take 1 tablet by mouth every 6 (six) hours as needed for moderate pain., Disp: 30 tablet, Rfl: 0   metFORMIN (GLUCOPHAGE) 500 MG tablet, Take 1 tablet (500 mg total) by mouth at bedtime., Disp: 180 tablet, Rfl: 1   metoprolol succinate (TOPROL-XL) 50 MG 24 hr tablet, TAKE 1 TABLET BY MOUTH EVERY DAY, Disp: 90 tablet, Rfl: 3   oxyCODONE (OXY IR/ROXICODONE) 5 MG immediate release tablet, Take 1-2 tablets (5-10 mg total) by mouth every 6 (six) hours as needed for severe pain., Disp: 42 tablet, Rfl: 0   rivaroxaban (XARELTO) 20 MG TABS tablet, Take 1 tablet (20 mg total) by mouth daily with supper., Disp: 28 tablet, Rfl: 0   traMADol (ULTRAM) 50 MG tablet, TAKE 1 TABLET (50 MG TOTAL) BY MOUTH EVERY 12 (TWELVE) HOURS AS NEEDED. (Patient taking differently: Take 50 mg by mouth every 12 (twelve) hours as needed (PAIN.).), Disp: 60 tablet, Rfl: 0   WIXELA INHUB 250-50 MCG/DOSE AEPB, INHALE 1 PUFF BY MOUTH EVERY 12 HOURS (Patient taking differently: Inhale 1 puff into the lungs 2 (two) times daily as needed (congestion/difficulty breathing.).), Disp: 180 each, Rfl: 1  Review of Systems:  Constitutional: Denies fever, chills, diaphoresis, appetite change and fatigue.  HEENT: Denies photophobia, eye pain, redness, hearing loss, ear pain, congestion, sore throat, rhinorrhea, sneezing, mouth sores,neck stiffness and tinnitus.   Respiratory: Denies cough, chest tightness,  and wheezing.   Cardiovascular: Denies chest pain, palpitations and leg swelling.  Gastrointestinal: Denies nausea, vomiting, abdominal pain, diarrhea, constipation, blood in stool and abdominal distention.  Genitourinary: Denies dysuria, urgency, frequency, hematuria, flank pain and difficulty urinating.  Endocrine: Denies: hot or cold intolerance, sweats, changes in hair or nails, polyuria, polydipsia. Musculoskeletal: Denies myalgias, back pain, joint  swelling, arthralgias and gait problem.  Skin: Denies pallor, rash and wound.  Neurological: Denies dizziness, seizures, syncope, weakness, light-headedness, numbness and headaches.  Hematological: Denies adenopathy. Easy bruising, personal or family bleeding history  Psychiatric/Behavioral: Denies suicidal ideation, mood changes, confusion, nervousness, sleep disturbance and agitation    Physical Exam: Vitals:   09/03/21 0938  BP: 140/80  Pulse: 75  Temp: 97.6 F (36.4 C)  TempSrc: Oral  SpO2: 98%  Weight: 158 lb 9.6 oz (71.9 kg)    Body mass index is 29.97 kg/m.   Constitutional: NAD, calm, comfortable Eyes: PERRL, lids and conjunctivae normal ENMT: Mucous membranes are moist. Posterior pharynx is erythematous but clear of any exudate or lesions.  Neck: She has a large mass with obvious edema that is very painful to touch in the left submandibular area/upper neck. Respiratory: clear to auscultation bilaterally, no wheezing, no crackles.  Increased respiratory effort.  No accessory muscle use.  Raspy noise/stridor with inhalation. Cardiovascular: Regular rate and rhythm, no murmurs / rubs / gallops. No extremity edema.  Psychiatric: Normal judgment and insight. Alert and oriented x 3. Normal mood.    Impression and Plan:  Type 2 diabetes mellitus without complication, unspecified whether long term insulin use (HCC) - Plan: POCT glycosylated hemoglobin (Hb A1C) -Well-controlled with an A1c of 5.8 in office today.  Localized swelling, mass and lump, neck SOB (shortness of breath) Raspy voice -Extremely concerned for possibility of retropharyngeal/peritonsillar abscess. -Given her shortness of breath, stridor and raspy voice, I do not believe this is appropriate for outpatient work-up. -I will refer her to the emergency department, she will need a CT soft tissue neck and possibly ENT consultation pending results. -She received Rocephin and dexamethasone 3 days ago and is  currently on Augmentin.  Time spent: 34 minutes reviewing chart, interviewing and examining patient and formulating plan of care.     Chaya Jan, MD [facility]

## 2021-09-04 ENCOUNTER — Telehealth: Payer: Self-pay | Admitting: Internal Medicine

## 2021-09-04 NOTE — Telephone Encounter (Signed)
Patient called back to let Dr.Hernandez know that she has taken an appointment for Monday at 12 at the ENT office, but wanted to know if she could get a temporary prescription for something to help with pain until Monday.   Please send to [id number] [place], [place] - [address] Phone:  [phone]  Fax:  [phone]       Please advise

## 2021-09-04 NOTE — Telephone Encounter (Signed)
Patient called to see if she canhave Dr.Hernandez get her into an ENT before the weekend. Patient states she called two office and the earliest she can get in is Monday for one and the other office was Friday. Patient states they believe she has a blockage in neck and she is in a lot of pain with vomiting. Patient does not want to go back to ER     Good callback number is [phone]      Please advise

## 2021-09-05 ENCOUNTER — Encounter (HOSPITAL_COMMUNITY): Payer: Self-pay | Admitting: Emergency Medicine

## 2021-09-05 ENCOUNTER — Inpatient Hospital Stay (HOSPITAL_COMMUNITY)
Admission: EM | Admit: 2021-09-05 | Discharge: 2021-09-12 | DRG: 155 | Disposition: A | Discharge status: Home / Self Care | Payer: Medicare Other | Attending: Family Medicine | Admitting: Family Medicine

## 2021-09-05 ENCOUNTER — Telehealth: Payer: Self-pay

## 2021-09-05 ENCOUNTER — Other Ambulatory Visit: Payer: Self-pay

## 2021-09-05 ENCOUNTER — Emergency Department (HOSPITAL_COMMUNITY): Payer: Medicare Other

## 2021-09-05 DIAGNOSIS — Z9049 Acquired absence of other specified parts of digestive tract: Secondary | ICD-10-CM

## 2021-09-05 DIAGNOSIS — L03211 Cellulitis of face: Secondary | ICD-10-CM | POA: Diagnosis not present

## 2021-09-05 DIAGNOSIS — L0390 Cellulitis, unspecified: Secondary | ICD-10-CM | POA: Diagnosis not present

## 2021-09-05 DIAGNOSIS — L03221 Cellulitis of neck: Secondary | ICD-10-CM | POA: Diagnosis present

## 2021-09-05 DIAGNOSIS — Z90722 Acquired absence of ovaries, bilateral: Secondary | ICD-10-CM | POA: Diagnosis not present

## 2021-09-05 DIAGNOSIS — Z9079 Acquired absence of other genital organ(s): Secondary | ICD-10-CM | POA: Diagnosis not present

## 2021-09-05 DIAGNOSIS — Z794 Long term (current) use of insulin: Secondary | ICD-10-CM

## 2021-09-05 DIAGNOSIS — Z90710 Acquired absence of both cervix and uterus: Secondary | ICD-10-CM | POA: Diagnosis not present

## 2021-09-05 DIAGNOSIS — Z20822 Contact with and (suspected) exposure to covid-19: Secondary | ICD-10-CM | POA: Diagnosis present

## 2021-09-05 DIAGNOSIS — R221 Localized swelling, mass and lump, neck: Secondary | ICD-10-CM | POA: Diagnosis not present

## 2021-09-05 DIAGNOSIS — E876 Hypokalemia: Secondary | ICD-10-CM | POA: Diagnosis present

## 2021-09-05 DIAGNOSIS — M47812 Spondylosis without myelopathy or radiculopathy, cervical region: Secondary | ICD-10-CM | POA: Diagnosis not present

## 2021-09-05 DIAGNOSIS — Z66 Do not resuscitate: Secondary | ICD-10-CM | POA: Diagnosis present

## 2021-09-05 DIAGNOSIS — Z96651 Presence of right artificial knee joint: Secondary | ICD-10-CM | POA: Diagnosis present

## 2021-09-05 DIAGNOSIS — Z87891 Personal history of nicotine dependence: Secondary | ICD-10-CM

## 2021-09-05 DIAGNOSIS — I82511 Chronic embolism and thrombosis of right femoral vein: Secondary | ICD-10-CM | POA: Diagnosis present

## 2021-09-05 DIAGNOSIS — J384 Edema of larynx: Secondary | ICD-10-CM | POA: Diagnosis not present

## 2021-09-05 DIAGNOSIS — Z79891 Long term (current) use of opiate analgesic: Secondary | ICD-10-CM

## 2021-09-05 DIAGNOSIS — Z7984 Long term (current) use of oral hypoglycemic drugs: Secondary | ICD-10-CM | POA: Diagnosis not present

## 2021-09-05 DIAGNOSIS — J309 Allergic rhinitis, unspecified: Secondary | ICD-10-CM | POA: Diagnosis present

## 2021-09-05 DIAGNOSIS — K219 Gastro-esophageal reflux disease without esophagitis: Secondary | ICD-10-CM | POA: Diagnosis present

## 2021-09-05 DIAGNOSIS — Z7901 Long term (current) use of anticoagulants: Secondary | ICD-10-CM | POA: Diagnosis not present

## 2021-09-05 DIAGNOSIS — K1121 Acute sialoadenitis: Secondary | ICD-10-CM | POA: Diagnosis present

## 2021-09-05 DIAGNOSIS — Z79899 Other long term (current) drug therapy: Secondary | ICD-10-CM | POA: Diagnosis not present

## 2021-09-05 DIAGNOSIS — E119 Type 2 diabetes mellitus without complications: Secondary | ICD-10-CM

## 2021-09-05 DIAGNOSIS — K111 Hypertrophy of salivary gland: Secondary | ICD-10-CM | POA: Diagnosis not present

## 2021-09-05 DIAGNOSIS — M1990 Unspecified osteoarthritis, unspecified site: Secondary | ICD-10-CM | POA: Diagnosis present

## 2021-09-05 DIAGNOSIS — E860 Dehydration: Secondary | ICD-10-CM | POA: Diagnosis present

## 2021-09-05 DIAGNOSIS — Z885 Allergy status to narcotic agent status: Secondary | ICD-10-CM

## 2021-09-05 DIAGNOSIS — Z888 Allergy status to other drugs, medicaments and biological substances status: Secondary | ICD-10-CM

## 2021-09-05 DIAGNOSIS — I6523 Occlusion and stenosis of bilateral carotid arteries: Secondary | ICD-10-CM | POA: Diagnosis not present

## 2021-09-05 DIAGNOSIS — I10 Essential (primary) hypertension: Secondary | ICD-10-CM | POA: Diagnosis present

## 2021-09-05 DIAGNOSIS — J392 Other diseases of pharynx: Secondary | ICD-10-CM | POA: Diagnosis not present

## 2021-09-05 LAB — CBC WITH DIFFERENTIAL/PLATELET
Abs Immature Granulocytes: 0.04 10*3/uL (ref 0.00–0.07)
Basophils Absolute: 0 10*3/uL (ref 0.0–0.1)
Basophils Relative: 0 %
Eosinophils Absolute: 0.2 10*3/uL (ref 0.0–0.5)
Eosinophils Relative: 2 %
HCT: 37.6 % (ref 36.0–46.0)
Hemoglobin: 12 g/dL (ref 12.0–15.0)
Immature Granulocytes: 0 %
Lymphocytes Relative: 24 %
Lymphs Abs: 2.8 10*3/uL (ref 0.7–4.0)
MCH: 29.8 pg (ref 26.0–34.0)
MCHC: 31.9 g/dL (ref 30.0–36.0)
MCV: 93.3 fL (ref 80.0–100.0)
Monocytes Absolute: 1 10*3/uL (ref 0.1–1.0)
Monocytes Relative: 8 %
Neutro Abs: 7.9 10*3/uL — ABNORMAL HIGH (ref 1.7–7.7)
Neutrophils Relative %: 66 %
Platelets: 384 10*3/uL (ref 150–400)
RBC: 4.03 MIL/uL (ref 3.87–5.11)
RDW: 13 % (ref 11.5–15.5)
WBC: 12 10*3/uL — ABNORMAL HIGH (ref 4.0–10.5)
nRBC: 0 % (ref 0.0–0.2)

## 2021-09-05 LAB — BASIC METABOLIC PANEL
Anion gap: 13 (ref 5–15)
BUN: 17 mg/dL (ref 8–23)
CO2: 24 mmol/L (ref 22–32)
Calcium: 9.5 mg/dL (ref 8.9–10.3)
Chloride: 99 mmol/L (ref 98–111)
Creatinine, Ser: 0.83 mg/dL (ref 0.44–1.00)
GFR, Estimated: >60 mL/min (ref >60)
Glucose, Bld: 126 mg/dL — ABNORMAL HIGH (ref 70–99)
Potassium: 3.1 mmol/L — ABNORMAL LOW (ref 3.5–5.1)
Sodium: 136 mmol/L (ref 135–145)

## 2021-09-05 LAB — LACTIC ACID, PLASMA
Lactic Acid, Venous: 1.6 mmol/L (ref 0.5–1.9)
Lactic Acid, Venous: 1.3 mmol/L (ref 0.5–1.9)

## 2021-09-05 LAB — GLUCOSE, CAPILLARY: Glucose-Capillary: 198 mg/dL — ABNORMAL HIGH (ref 70–99)

## 2021-09-05 LAB — RESP PANEL BY RT-PCR (FLU A&B, COVID) ARPGX2
Influenza A by PCR: NEGATIVE
Influenza B by PCR: NEGATIVE
SARS Coronavirus 2 by RT PCR: NEGATIVE

## 2021-09-05 MED ORDER — METOPROLOL SUCCINATE ER 50 MG PO TB24
50.0000 mg | ORAL_TABLET | Freq: Every day | ORAL | Status: DC
  Administered 2021-09-06 – 2021-09-12 (×7): 50 mg via ORAL
  Filled 2021-09-05 (×7): qty 1

## 2021-09-05 MED ORDER — HYDRALAZINE HCL 20 MG/ML IJ SOLN: 5.0000 mg | INTRAMUSCULAR | Status: DC | PRN

## 2021-09-05 MED ORDER — ALBUTEROL SULFATE (2.5 MG/3ML) 0.083% IN NEBU
2.5000 mg | INHALATION_SOLUTION | Freq: Four times a day (QID) | RESPIRATORY_TRACT | Status: DC | PRN
  Administered 2021-09-07: 2.5 mg via RESPIRATORY_TRACT
  Filled 2021-09-05: qty 3

## 2021-09-05 MED ORDER — SODIUM CHLORIDE 0.9% FLUSH
3.0000 mL | Freq: Two times a day (BID) | INTRAVENOUS | Status: DC
  Administered 2021-09-05 – 2021-09-11 (×10): 3 mL via INTRAVENOUS

## 2021-09-05 MED ORDER — ONDANSETRON HCL 4 MG PO TABS: 4.0000 mg | ORAL_TABLET | Freq: Four times a day (QID) | ORAL | Status: DC | PRN

## 2021-09-05 MED ORDER — DEXAMETHASONE SODIUM PHOSPHATE 10 MG/ML IJ SOLN
10.0000 mg | Freq: Once | INTRAMUSCULAR | Status: AC
  Administered 2021-09-05: 10 mg via INTRAVENOUS
  Filled 2021-09-05: qty 1

## 2021-09-05 MED ORDER — IOHEXOL 300 MG/ML  SOLN
75.0000 mL | Freq: Once | INTRAMUSCULAR | Status: AC | PRN
  Administered 2021-09-05: 75 mL via INTRAVENOUS

## 2021-09-05 MED ORDER — LACTATED RINGERS IV SOLN: INTRAVENOUS | Status: DC

## 2021-09-05 MED ORDER — ONDANSETRON HCL 4 MG/2ML IJ SOLN
4.0000 mg | Freq: Four times a day (QID) | INTRAMUSCULAR | Status: DC | PRN
  Administered 2021-09-07: 4 mg via INTRAVENOUS
  Filled 2021-09-05: qty 2

## 2021-09-05 MED ORDER — ACETAMINOPHEN 325 MG PO TABS
650.0000 mg | ORAL_TABLET | Freq: Four times a day (QID) | ORAL | Status: DC | PRN
  Administered 2021-09-06 – 2021-09-12 (×7): 650 mg via ORAL
  Filled 2021-09-05 (×7): qty 2

## 2021-09-05 MED ORDER — RIVAROXABAN 20 MG PO TABS
20.0000 mg | ORAL_TABLET | Freq: Every day | ORAL | Status: DC
  Administered 2021-09-06 – 2021-09-12 (×5): 20 mg via ORAL
  Filled 2021-09-05 (×6): qty 1

## 2021-09-05 MED ORDER — HYDROMORPHONE HCL 1 MG/ML IJ SOLN
1.0000 mg | Freq: Once | INTRAMUSCULAR | Status: AC
  Administered 2021-09-05: 1 mg via INTRAVENOUS
  Filled 2021-09-05: qty 1

## 2021-09-05 MED ORDER — DOCUSATE SODIUM 100 MG PO CAPS
100.0000 mg | ORAL_CAPSULE | Freq: Two times a day (BID) | ORAL | Status: DC
  Administered 2021-09-06 – 2021-09-08 (×3): 100 mg via ORAL
  Filled 2021-09-05 (×9): qty 1

## 2021-09-05 MED ORDER — SODIUM CHLORIDE 0.9 % IV SOLN
3.0000 g | Freq: Four times a day (QID) | INTRAVENOUS | Status: DC
  Administered 2021-09-05 – 2021-09-08 (×12): 3 g via INTRAVENOUS
  Filled 2021-09-05 (×13): qty 8

## 2021-09-05 MED ORDER — BISACODYL 5 MG PO TBEC: 5.0000 mg | DELAYED_RELEASE_TABLET | Freq: Every day | ORAL | Status: DC | PRN

## 2021-09-05 MED ORDER — MORPHINE SULFATE (PF) 4 MG/ML IV SOLN
4.0000 mg | Freq: Once | INTRAVENOUS | Status: AC
  Administered 2021-09-05: 4 mg via INTRAVENOUS
  Filled 2021-09-05: qty 1

## 2021-09-05 MED ORDER — POLYETHYLENE GLYCOL 3350 17 G PO PACK: 17.0000 g | PACK | Freq: Every day | ORAL | Status: DC | PRN

## 2021-09-05 MED ORDER — FLUTICASONE PROPIONATE 50 MCG/ACT NA SUSP
2.0000 | Freq: Every day | NASAL | Status: DC | PRN
  Filled 2021-09-05: qty 16

## 2021-09-05 MED ORDER — AMLODIPINE BESYLATE 10 MG PO TABS
10.0000 mg | ORAL_TABLET | Freq: Every day | ORAL | Status: DC
  Administered 2021-09-06 – 2021-09-12 (×7): 10 mg via ORAL
  Filled 2021-09-05 (×7): qty 1

## 2021-09-05 MED ORDER — INSULIN ASPART 100 UNIT/ML IJ SOLN
0.0000 [IU] | Freq: Three times a day (TID) | INTRAMUSCULAR | Status: DC
  Administered 2021-09-06: 2 [IU] via SUBCUTANEOUS

## 2021-09-05 MED ORDER — MORPHINE SULFATE (PF) 2 MG/ML IV SOLN
2.0000 mg | INTRAVENOUS | Status: DC | PRN
  Administered 2021-09-06 – 2021-09-07 (×4): 2 mg via INTRAVENOUS
  Filled 2021-09-05 (×4): qty 1

## 2021-09-05 MED ORDER — MOMETASONE FURO-FORMOTEROL FUM 200-5 MCG/ACT IN AERO
2.0000 | INHALATION_SPRAY | Freq: Two times a day (BID) | RESPIRATORY_TRACT | Status: DC
  Administered 2021-09-05 – 2021-09-09 (×8): 2 via RESPIRATORY_TRACT
  Filled 2021-09-05: qty 8.8

## 2021-09-05 MED ORDER — ACETAMINOPHEN 650 MG RE SUPP: 650.0000 mg | Freq: Four times a day (QID) | RECTAL | Status: DC | PRN

## 2021-09-05 NOTE — Consult Note (Signed)
Reason for Consult:neck cellulitis Referring Physician: er  Kristy [name] is an 80 y.o. female.  HPI: hx of 56 days of swelling starting in the left submandibular area. She has been seen in Urgent and given Amox. She has progressed to more swelling in last few days and now here again. She has CT scan with no abscess but submandibular cellulitis. She has some FOM edema. No dental problems and all the molars on the left area gone.   Past Medical History:  Diagnosis Date   Allergic rhinitis, seasonal    Arthritis    Asthma    bronchial with weather change   Diabetes mellitus without complication (HCC)    type 2    Dysrhythmia    "Skipping a beat"   GERD (gastroesophageal reflux disease)    History of colon polyps    Hypertension    Pneumonia    Rotator cuff tear, right     Past Surgical History:  Procedure Laterality Date   BACK SURGERY     x3  ruptured disc    CHOLECYSTECTOMY OPEN  AGE 35   EYE SURGERY     bil cataracts   HERNIA REPAIR     INGUINAL HERNIA REPAIR Right AGE 35s   KNEE ARTHROSCOPY Left 2014   LUMBAR DISC SURGERY  x2  LAST DATE EARLY 2000s   ORIF ANKLE FRACTURE Right 06/24/2018   Procedure: OPEN REDUCTION INTERNAL FIXATION (ORIF) RIGHT ANKLE FRACTURE;  Surgeon: Kathryne Hitch, MD;  Location: [facility];  Service: Orthopedics;  Laterality: Right;   ROTATOR CUFF REPAIR Left 01/2015   TONSILLECTOMY  AGE 37   TOTAL KNEE ARTHROPLASTY Right 12/09/2020   Procedure: TOTAL KNEE ARTHROPLASTY;  Surgeon: Ollen Gross, MD;  Location: [facility];  Service: Orthopedics;  Laterality: Right;    VAGINAL HYSTERECTOMY  AGE 14   WITH BSO    Family History  Problem Relation Age of Onset   Colon cancer Neg Hx    Esophageal cancer Neg Hx    Rectal cancer Neg Hx    Stomach cancer Neg Hx     Social History:  reports that she quit smoking about 32 years ago. Her smoking use included cigarettes. She has never used smokeless tobacco. She reports current alcohol use of about 3.0  - 4.0 standard drinks per week. She reports that she does not use drugs.  Allergies:  Allergies  Allergen Reactions   Clonidine Hives   Tramadol Itching    Medications: I have reviewed the patient's current medications.  Results for orders placed or performed during the hospital encounter of 09/05/21 (from the past 48 hour(s))  CBC with Differential     Status: Abnormal   Collection Time: 09/05/21  6:48 AM  Result Value Ref Range   WBC 12.0 (H) 4.0 - 10.5 K/uL   RBC 4.03 3.87 - 5.11 MIL/uL   Hemoglobin 12.0 12.0 - 15.0 g/dL   HCT 81.8 40.3 - 75.4 %   MCV 93.3 80.0 - 100.0 fL   MCH 29.8 26.0 - 34.0 pg   MCHC 31.9 30.0 - 36.0 g/dL   RDW 36.0 67.7 - 03.4 %   Platelets 384 150 - 400 K/uL   nRBC 0.0 0.0 - 0.2 %   Neutrophils Relative % 66 %   Neutro Abs 7.9 (H) 1.7 - 7.7 K/uL   Lymphocytes Relative 24 %   Lymphs Abs 2.8 0.7 - 4.0 K/uL   Monocytes Relative 8 %   Monocytes Absolute 1.0 0.1 -  1.0 K/uL   Eosinophils Relative 2 %   Eosinophils Absolute 0.2 0.0 - 0.5 K/uL   Basophils Relative 0 %   Basophils Absolute 0.0 0.0 - 0.1 K/uL   Immature Granulocytes 0 %   Abs Immature Granulocytes 0.04 0.00 - 0.07 K/uL    Comment: Performed at [facility], [address] [place], [place] [address]  Basic metabolic panel     Status: Abnormal   Collection Time: 09/05/21  6:48 AM  Result Value Ref Range   Sodium 136 135 - 145 mmol/L   Potassium 3.1 (L) 3.5 - 5.1 mmol/L   Chloride 99 98 - 111 mmol/L   CO2 24 22 - 32 mmol/L   Glucose, Bld 126 (H) 70 - 99 mg/dL    Comment: Glucose reference range applies only to samples taken after fasting for at least 8 hours.   BUN 17 8 - 23 mg/dL   Creatinine, Ser 1.51 0.44 - 1.00 mg/dL   Calcium 9.5 8.9 - 76.1 mg/dL   GFR, Estimated >60 >73 mL/min    Comment: (NOTE) Calculated using the CKD-EPI Creatinine Equation (2021)    Anion gap 13 5 - 15    Comment: Performed at [facility], [address], [place], [place] [address]  Lactic  acid, plasma     Status: None   Collection Time: 09/05/21 12:10 PM  Result Value Ref Range   Lactic Acid, Venous 1.6 0.5 - 1.9 mmol/L    Comment: Performed at [facility], [address] [place], [place] [address]    CT Soft Tissue Neck W Contrast  Result Date: 09/05/2021 CLINICAL DATA:  Cellulitis with progressive soft tissue swelling. On antibiotics. EXAM: CT NECK WITH CONTRAST TECHNIQUE: Multidetector CT imaging of the neck was performed using the standard protocol following the bolus administration of intravenous contrast. CONTRAST:  94mL OMNIPAQUE IOHEXOL 300 MG/ML  SOLN COMPARISON:  CT neck 09/03/2021 FINDINGS: Pharynx and larynx: No pharyngeal mass or abscess. Airway intact. Epiglottis and larynx normal. Effacement of the left piriform sinus due to submucosal edema of the left pharynx. Salivary glands: There is stranding surrounding the left submandibular gland however the left submandibular gland is normal without stone mass or abscess. Right submandibular gland normal. Parotid normal bilaterally. Thyroid: Negative Lymph nodes: Scattered submandibular lymph nodes bilaterally left greater than right with mild progression since the prior study. These appear reactive due to cellulitis. Subcentimeter level 2 lymph nodes on the left also likely reactive. These are similar to the prior study. Vascular: Normal vascular enhancement. Limited intracranial: Negative Visualized orbits: No orbital mass or edema. Bilateral cataract extraction Mastoids and visualized paranasal sinuses: Negative Skeleton: No acute skeletal abnormality. No dental abscess identified. Upper chest: Lung apices clear bilaterally. Other: Progression of subcutaneous edema in the submandibular region. This is more prominent the left but does cross the midline to the right. No focal abscess. There is edema surrounding the left submandibular gland and also extending posterior to the sternocleidomastoid muscle on the left.  Hyperenhancing submandibular lymph nodes left greater than right have progressed in the interval. IMPRESSION: 1. Progression of cellulitis in the submandibular region, asymmetric to the left. Progression of reactive lymph nodes in the left submandibular region. No abscess 2. No airway compromise identified. There is mild edema effacing the left piriform sinus Electronically Signed   By: Marlan Palau M.D.   On: 09/05/2021 13:03    ROS Blood pressure (!) 135/92, pulse 82, temperature 98.4 F (36.9 C),  temperature source Oral, resp. rate 15, SpO2 95 %. Physical Exam Constitutional:      Appearance: She is well-developed.  HENT:     Head: Normocephalic.     Comments: No evidence of swelling of pharynx and voice is normal. No trismus. The upper left dentition is poor but no molars on the left lower. There is mild edema of the wharthins ducts. The neck is swollen in the left submax and into the mental area. It is soft but tender.  Neurological:     Mental Status: She is alert.      Assessment/Plan: Left submandibular inflammation/cellulitis- she has no indication for I/D currently. The neck is soft. She unlikely has a dental source. Needs IV antibiotics to cover staph and stepdown observation since the floor of mouth has some swelling.   Suzanna Obey 09/05/2021, 2:07 PM

## 2021-09-05 NOTE — ED Notes (Signed)
O2 at 2LPM [place] initiated for comfort.  °

## 2021-09-05 NOTE — Telephone Encounter (Signed)
Spoke with patient and gave Dr Hardie Shackleton recommendation.  Patient is currently at the ED.

## 2021-09-05 NOTE — ED Notes (Signed)
Taken to CT by transporter. 

## 2021-09-05 NOTE — H&P (Signed)
History and Physical    ANAPAOLA KINSEL QZE:[id number] DOB: 12-17-1940 DOA: 09/05/2021  PCP: Philip Aspen, Limmie Patricia, MD Consultants:  Tresa Endo - cardiology; Aluisio - orthopedics Patient coming from:  Home - lives alone; NOK: Filomena Jungling, [phone]  Chief Complaint: Neck swelling  HPI: Kristy [name] is a 80 y.o. female with medical history significant of DM, asthma, and HTN presenting with neck swelling.  She was initially seen on 11/14 for this issue and was diagnosed with peritonsillar abscess and treated with Rocephin -> Augmentin and Decadron.   She followed up with her PCP as directed on 11/16 and had SOB, stridor, and raspy voice and so was sent back to the ER for further evaluation.  CT showed early cellulitis and possible salivary duct stone; she was given a dose of Vanc, told to continue Augmentin, and was referred to ENT.   She was unable to get into ENT.  She has been taking abx but it has not gotten better.  Last night, the pain was getting worse and she had trouble swallowing.  She finally got up early this Am and came in.  She has been able to tolerate secretions and water but had trouble with solids.  No fever.  No known dental issues.  No predisposing factors known.    ED Course: Worsening neck infection despite Augmentin.  No abscess prior or now.  Now unable to eat solid foods, position SOB, uncontrolled pain.  Impressive swelling, no stridor.  Repeat CT with progressive infection.  No airway compromise.  Dr. Jearld Fenton will see.  Probably SDU given neck swelling.  Pharmacy recommends Unasyn, give Decadron and pain medication.  Review of Systems: As per HPI; otherwise review of systems reviewed and negative.   Ambulatory Status:  Ambulates without assistance - finished recovery from TKR  COVID Vaccine Status:  Complete  Past Medical History:  Diagnosis Date   Allergic rhinitis, seasonal    Arthritis    Asthma    bronchial with weather change   Diabetes mellitus  without complication (HCC)    type 2    Dysrhythmia    "Skipping a beat"   GERD (gastroesophageal reflux disease)    History of colon polyps    Hypertension    Pneumonia    Rotator cuff tear, right     Past Surgical History:  Procedure Laterality Date   BACK SURGERY     x3  ruptured disc    CHOLECYSTECTOMY OPEN  AGE 47   EYE SURGERY     bil cataracts   HERNIA REPAIR     INGUINAL HERNIA REPAIR Right AGE 47s   KNEE ARTHROSCOPY Left 2014   LUMBAR DISC SURGERY  x2  LAST DATE EARLY 2000s   ORIF ANKLE FRACTURE Right 06/24/2018   Procedure: OPEN REDUCTION INTERNAL FIXATION (ORIF) RIGHT ANKLE FRACTURE;  Surgeon: Kathryne Hitch, MD;  Location: [facility];  Service: Orthopedics;  Laterality: Right;   ROTATOR CUFF REPAIR Left 01/2015   TONSILLECTOMY  AGE 1   TOTAL KNEE ARTHROPLASTY Right 12/09/2020   Procedure: TOTAL KNEE ARTHROPLASTY;  Surgeon: Ollen Gross, MD;  Location: [facility];  Service: Orthopedics;  Laterality: Right;    VAGINAL HYSTERECTOMY  AGE 50   WITH BSO    Social History   Socioeconomic History   Marital status: Widowed    Spouse name: Not on file   Number of children: Not on file   Years of education: Not on file   Highest education level:  Not on file  Occupational History   Occupation: retired  Tobacco Use   Smoking status: Former    Years: 10.00    Types: Cigarettes    Quit date: 10/19/1988    Years since quitting: 32.9   Smokeless tobacco: Never  Vaping Use   Vaping Use: Never used  Substance and Sexual Activity   Alcohol use: Yes    Alcohol/week: 3.0 - 4.0 standard drinks    Types: 3 - 4 Glasses of wine per week    Comment: 2-3 times a week   Drug use: Never   Sexual activity: Not Currently  Other Topics Concern   Not on file  Social History Narrative   Not on file   Social Determinants of Health   Financial Resource Strain: Low Risk    Difficulty of Paying Living Expenses: Not hard at all  Food Insecurity: No Food Insecurity    Worried About Programme researcher, broadcasting/film/video in the Last Year: Never true   Ran Out of Food in the Last Year: Never true  Transportation Needs: No Transportation Needs   Lack of Transportation (Medical): No   Lack of Transportation (Non-Medical): No  Physical Activity: Not on file  Stress: No Stress Concern Present   Feeling of Stress : Not at all  Social Connections: Not on file  Intimate Partner Violence: Not on file    Allergies  Allergen Reactions   Clonidine Hives    Family History  Problem Relation Age of Onset   Colon cancer Neg Hx    Esophageal cancer Neg Hx    Rectal cancer Neg Hx    Stomach cancer Neg Hx     Prior to Admission medications   Medication Sig Start Date End Date Taking? Authorizing Provider  albuterol (VENTOLIN HFA) 108 (90 Base) MCG/ACT inhaler INHALE 2 PUFFS BY MOUTH EVERY 6 HOURS AS NEEDED FOR WHEEZE Patient taking differently: Inhale 2 puffs into the lungs every 6 (six) hours as needed for shortness of breath or wheezing. 03/25/20   Philip Aspen, Limmie Patricia, MD  amLODipine (NORVASC) 10 MG tablet TAKE 1 TABLET BY MOUTH EVERY DAY 04/15/21   Philip Aspen, Limmie Patricia, MD  amoxicillin-clavulanate (AUGMENTIN) 875-125 MG tablet Take 1 tablet by mouth every 12 (twelve) hours for 10 days. 09/01/21 09/11/21  Theadora Rama Scales, PA-C  benazepril (LOTENSIN) 40 MG tablet TAKE 1 TABLET BY MOUTH EVERY DAY 04/15/21   Philip Aspen, Limmie Patricia, MD  celecoxib (CELEBREX) 200 MG capsule TAKE 1 CAPSULE BY MOUTH EVERY DAY 07/03/21   Philip Aspen, Limmie Patricia, MD  fluticasone (FLONASE) 50 MCG/ACT nasal spray SPRAY 2 SPRAYS INTO EACH NOSTRIL EVERY DAY Patient taking differently: Place 2 sprays into both nostrils daily as needed for allergies. 06/09/19   Philip Aspen, Limmie Patricia, MD  hydrochlorothiazide (HYDRODIURIL) 12.5 MG tablet TAKE 1 TABLET (12.5 MG TOTAL) BY MOUTH AS NEEDED. FOR SWELLING 03/21/21   Lennette Bihari, MD  HYDROcodone-acetaminophen (NORCO) 5-325 MG tablet Take 1  tablet by mouth every 6 (six) hours as needed for moderate pain. 06/03/21   Philip Aspen, Limmie Patricia, MD  metFORMIN (GLUCOPHAGE) 500 MG tablet Take 1 tablet (500 mg total) by mouth at bedtime. 02/28/21   Philip Aspen, Limmie Patricia, MD  metoprolol succinate (TOPROL-XL) 50 MG 24 hr tablet TAKE 1 TABLET BY MOUTH EVERY DAY 02/20/21   Lennette Bihari, MD  oxyCODONE (OXY IR/ROXICODONE) 5 MG immediate release tablet Take 1-2 tablets (5-10 mg total) by mouth every 6 (six) hours  as needed for severe pain. 12/10/20   Nelia Shi D, PA-C  rivaroxaban (XARELTO) 20 MG TABS tablet Take 1 tablet (20 mg total) by mouth daily with supper. 08/06/21   Philip Aspen, Limmie Patricia, MD  traMADol (ULTRAM) 50 MG tablet TAKE 1 TABLET (50 MG TOTAL) BY MOUTH EVERY 12 (TWELVE) HOURS AS NEEDED. Patient taking differently: Take 50 mg by mouth every 12 (twelve) hours as needed (PAIN.). 08/02/20   Philip Aspen, Limmie Patricia, MD  WIXELA INHUB 250-50 MCG/DOSE AEPB INHALE 1 PUFF BY MOUTH EVERY 12 HOURS Patient taking differently: Inhale 1 puff into the lungs 2 (two) times daily as needed (congestion/difficulty breathing.). 04/04/19   Philip Aspen, Limmie Patricia, MD    Physical Exam: Vitals:   09/05/21 1345 09/05/21 1400 09/05/21 1415 09/05/21 1500  BP: 136/76 135/81 (!) 153/85 120/79  Pulse: 90 77 82 76  Resp: 15 16 15 14   Temp:      TempSrc:      SpO2: 100% 99% 100% 95%     General:  Appears calm and comfortable and is in NAD Eyes:  PERRL, EOMI, normal lids, iris ENT:  grossly normal hearing, lips & tongue, mmm; appropriate dentition on limited exam; mild trismus, coughed excessively after attempting to open wide Neck:  Submandibular fullness, fluctuance along L submandibular region with mild overlying erythema    Cardiovascular:  RRR, no m/r/g. No LE edema.  Respiratory:   CTA bilaterally with no wheezes/rales/rhonchi.  Normal respiratory effort. Abdomen:  soft, NT, ND Skin:  no rash or induration seen on limited  exam Musculoskeletal:  grossly normal tone BUE/BLE, good ROM, no bony abnormality Psychiatric:  blunted mood and affect, speech fluent and appropriate, AOx3 Neurologic:  CN 2-12 grossly intact, moves all extremities in coordinated fashion    Radiological Exams on Admission: Independently reviewed - see discussion in A/P where applicable  CT Soft Tissue Neck W Contrast  Result Date: 09/05/2021 CLINICAL DATA:  Cellulitis with progressive soft tissue swelling. On antibiotics. EXAM: CT NECK WITH CONTRAST TECHNIQUE: Multidetector CT imaging of the neck was performed using the standard protocol following the bolus administration of intravenous contrast. CONTRAST:  18mL OMNIPAQUE IOHEXOL 300 MG/ML  SOLN COMPARISON:  CT neck 09/03/2021 FINDINGS: Pharynx and larynx: No pharyngeal mass or abscess. Airway intact. Epiglottis and larynx normal. Effacement of the left piriform sinus due to submucosal edema of the left pharynx. Salivary glands: There is stranding surrounding the left submandibular gland however the left submandibular gland is normal without stone mass or abscess. Right submandibular gland normal. Parotid normal bilaterally. Thyroid: Negative Lymph nodes: Scattered submandibular lymph nodes bilaterally left greater than right with mild progression since the prior study. These appear reactive due to cellulitis. Subcentimeter level 2 lymph nodes on the left also likely reactive. These are similar to the prior study. Vascular: Normal vascular enhancement. Limited intracranial: Negative Visualized orbits: No orbital mass or edema. Bilateral cataract extraction Mastoids and visualized paranasal sinuses: Negative Skeleton: No acute skeletal abnormality. No dental abscess identified. Upper chest: Lung apices clear bilaterally. Other: Progression of subcutaneous edema in the submandibular region. This is more prominent the left but does cross the midline to the right. No focal abscess. There is edema  surrounding the left submandibular gland and also extending posterior to the sternocleidomastoid muscle on the left. Hyperenhancing submandibular lymph nodes left greater than right have progressed in the interval. IMPRESSION: 1. Progression of cellulitis in the submandibular region, asymmetric to the left. Progression of reactive lymph nodes in the  left submandibular region. No abscess 2. No airway compromise identified. There is mild edema effacing the left piriform sinus Electronically Signed   By: Marlan Palau M.D.   On: 09/05/2021 13:03    EKG: not done   Labs on Admission: I have personally reviewed the available labs and imaging studies at the time of the admission.  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)   Neck cellulitis -Patient with neck swelling, dysphagia, hot potato voice -She was diagnosed with possible peritonsillar abscess on 11/14 and treated with Rocephin x 1 -> Augmentin -She worsened and so came back to the ER on 11/16 and was treated with Vanc x 1 and ongoing Augmentin -She reports progressive symptoms -Currently with erythema and fluctuance to neck in submandibular region -Careful inspection of the skin does not show skin breaks -Normal -Mildly increased WBC count -She was given Unasyn in the ER based on pharmacy recs, will continue -Admission in this patient is warranted due to:  -failure of outpatient antibiotic therapy (progression/no improvement after a minimum of 48 hours with adequate antibiotic regimen with first-generation cephalosporin or antistaph penicillin or PCN-allergic regimen or resistant organism regimen) -Will admit to progressive care for close airway monitoring -ENT is consulted and  Dr. Jearld Fenton will see  DM -Recent A1c shows good control -hold Glucophage -Cover with moderate-scale SSI   HTN -Patient is on ACE; this is UL submandibular edema without lip/tongue involvement so very low suspicion for angioedema but will hold for now -Continue Norvasc, Toprol XL  Asthma -Continue Albuterol, Flonase, Wixela (Dulera per formulary)  DVT -After TKR -Continue Xarelto  Goals of care -Patient prefers to be DNR -However, in the case of current airway potential compromise she is willing to be intubated for airway protection only if needed    Note: This patient has been tested and is negative for the novel coronavirus COVID-19. The patient has been fully vaccinated against COVID-19.   Level of care: Progressive DVT prophylaxis: SCDs Code Status:  DNR (see above) - confirmed with patient Family Communication: None present; I spoke with the patient's son by telephone at the time of admission. Disposition Plan:  The patient is from: home  Anticipated d/c is to: home without [facility] services  Anticipated d/c date will depend on clinical response to treatment, likely 2-3 days  Patient is currently: acutely ill Consults called: ENT  Admission status:  Admit - It is my clinical opinion that admission to INPATIENT is reasonable and necessary because of the expectation that this patient will require hospital care that crosses at least 2 midnights to treat this condition based on the medical complexity of the problems presented.  Given the aforementioned information, the predictability of an adverse outcome is felt to be significant.    Jonah Blue MD [facility]   How to contact the [facility] Attending or Consulting provider 7A - 7P or covering provider during after hours 7P -7A, for this patient?  Check the care team in [facility] and look for a) attending/consulting [facility] provider listed and b) the [facility] team listed Log into [email or web address] and use [place]'s universal password to  access. If you do not have the password, please contact the hospital operator. Locate the [facility] provider you are  looking for under [facility] and page to a number that you can be directly reached. If you still have difficulty reaching the provider, please page the [facility] (Director on Call) for the Hospitalists listed on amion for assistance.   09/05/2021, 3:10 PM

## 2021-09-05 NOTE — ED Provider Notes (Signed)
Emergency Medicine Provider Triage Evaluation Note  Kristy [name] , a 80 y.o. female  was evaluated in triage.  Pt complains of difficulty breathing.  Seen at ED recently and diagnosed with cellulitis vs sialoadenitis on CT of neck.  She is currently on abx but states infection worsening.  Tried to see ENT but not able to get appt yet.  Review of Systems  Positive: Difficulty breathing Negative: fever  Physical Exam  BP (!) 158/76 (BP Location: Right Arm)   Pulse 82   Temp 98.7 F (37.1 C)   Resp (!) 22   SpO2 97%  Gen:   Awake, no distress   Resp:  Normal effort  MSK:   Moves extremities without difficulty  Other:  Swelling noted to left cervical and anterior neck, this is not brawny, no edema noted to posterior oropharynx or in floor/roof of mouth, no oral lesions, handling secretions, no stridor noted in triage  Medical Decision Making  Medically screening exam initiated at 6:33 AM.  Appropriate orders placed.  Kristy [name] was informed that the remainder of the evaluation will be completed by another provider, this initial triage assessment does not replace that evaluation, and the importance of remaining in the ED until their evaluation is complete.  Recent CT scan for same 09/03/21 with cellulitis vs sialoadenitis, worsening despite abx per her report.  No drooling or stridor at present but reports more difficulty breathing/swallowing.  Repeat labs sent.  Made acuity 2, prioritize room assignment.   Garlon Hatchet, PA-C 09/05/21 [id number]    Glynn Octave, MD 09/05/21 [phone]

## 2021-09-05 NOTE — ED Triage Notes (Signed)
Patient with neck swelling and states she is having some shortness of breath with it.  She was seen at [facility] who gave a referral into ENT and has not seen them yet at this time.

## 2021-09-05 NOTE — Telephone Encounter (Signed)
Patient called asking if Dr. Ardyth Harps will send something to the pharmacy for neck pain patient stated she was just here this week for an appt

## 2021-09-05 NOTE — ED Provider Notes (Signed)
[facility] EMERGENCY DEPARTMENT Provider Note   CSN: [id number] Arrival date & time: 09/05/21  4481     History Chief Complaint  Patient presents with   Shortness of Breath    Kristy [name] is a 80 y.o. female.  The history is provided by the patient and medical records. No language interpreter was used.  Shortness of Breath Severity:  Moderate Onset quality:  Gradual Timing:  Constant Progression:  Worsening Context comment:  Worsening neck swelling Relieved by:  Nothing Worsened by:  Nothing Ineffective treatments:  None tried Associated symptoms: neck pain and swollen glands (swollen neck)   Associated symptoms: no abdominal pain, no chest pain, no cough, no diaphoresis, no fever, no headaches, no hemoptysis, no rash, no vomiting and no wheezing       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   Osteoarthritis of right knee 12/09/2020   Hypokalemia 03/07/2020   Type 2 diabetes mellitus without complication (HCC) 03/07/2020   IGT (impaired glucose tolerance) 03/14/2019   Acute labyrinthitis, unspecified laterality 09/08/2018   Trimalleolar fracture of right ankle 06/24/2018   Closed trimalleolar fracture of right ankle 06/21/2018   OA (osteoarthritis) of knee 09/22/2012   UNSPECIFIED DISORDER TEETH&SUPPORTING STRUCTURES 09/09/2010   Essential hypertension 04/07/2007   Allergic rhinitis 04/07/2007   Asthma 04/07/2007   GERD 04/07/2007   COLONIC POLYPS, HX OF 04/07/2007    Past Surgical History:  Procedure  Laterality Date   BACK SURGERY     x3  ruptured disc    CHOLECYSTECTOMY OPEN  AGE 63   EYE SURGERY     bil cataracts   HERNIA REPAIR     INGUINAL HERNIA REPAIR Right AGE 63s   KNEE ARTHROSCOPY Left 2014   LUMBAR DISC SURGERY  x2  LAST DATE EARLY 2000s   ORIF ANKLE FRACTURE Right 06/24/2018   Procedure: OPEN REDUCTION INTERNAL FIXATION (ORIF) RIGHT ANKLE FRACTURE;  Surgeon: Kathryne Hitch, MD;  Location: [facility];  Service: Orthopedics;  Laterality: Right;   ROTATOR CUFF REPAIR Left 01/2015   TONSILLECTOMY  AGE 59   TOTAL KNEE ARTHROPLASTY Right 12/09/2020   Procedure: TOTAL KNEE ARTHROPLASTY;  Surgeon: Ollen Gross, MD;  Location: [facility];  Service: Orthopedics;  Laterality: Right;    VAGINAL HYSTERECTOMY  AGE 46   WITH BSO     OB History   No obstetric history on file.     Family History  Problem Relation Age of Onset   Colon cancer Neg Hx    Esophageal cancer Neg Hx    Rectal cancer Neg Hx    Stomach cancer Neg Hx     Social History   Tobacco Use   Smoking status: Former    Years: 10.00    Types: Cigarettes    Quit date: 10/19/1988    Years since quitting: 32.9   Smokeless tobacco: Never   Tobacco comments:    1 a day  Vaping Use   Vaping Use: Never used  Substance Use Topics   Alcohol use: Yes    Alcohol/week: 3.0 - 4.0 standard drinks    Types: 3 - 4 Glasses of wine per week    Comment: 2-3 times a week   Drug use: Never    Home Medications Prior to Admission medications   Medication Sig Start Date End Date Taking? Authorizing Provider  albuterol (VENTOLIN HFA) 108 (90 Base) MCG/ACT inhaler INHALE 2 PUFFS BY MOUTH EVERY 6 HOURS AS NEEDED FOR WHEEZE Patient taking differently: Inhale 2 puffs into the lungs every 6 (six) hours as needed for shortness of breath or wheezing. 03/25/20   Isaac Bliss, Rayford Halsted, MD  amLODipine (NORVASC) 10 MG tablet TAKE 1 TABLET BY MOUTH EVERY DAY 04/15/21   Isaac Bliss, Rayford Halsted, MD  amoxicillin-clavulanate  (AUGMENTIN) 875-125 MG tablet Take 1 tablet by mouth every 12 (twelve) hours for 10 days. 09/01/21 09/11/21  Lynden Oxford Scales, PA-C  benazepril (LOTENSIN) 40 MG tablet TAKE 1 TABLET BY MOUTH EVERY DAY 04/15/21   Isaac Bliss, Rayford Halsted, MD  celecoxib (CELEBREX) 200 MG capsule TAKE 1 CAPSULE BY MOUTH EVERY DAY 07/03/21   Isaac Bliss, Rayford Halsted, MD  fluticasone (FLONASE) 50 MCG/ACT nasal spray SPRAY 2 SPRAYS INTO EACH NOSTRIL EVERY DAY Patient taking differently: Place 2 sprays into both nostrils daily as needed for allergies. 06/09/19   Isaac Bliss, Rayford Halsted, MD  hydrochlorothiazide (HYDRODIURIL) 12.5 MG tablet TAKE 1 TABLET (12.5 MG TOTAL) BY MOUTH AS NEEDED. FOR SWELLING 03/21/21   Troy Sine, MD  HYDROcodone-acetaminophen (NORCO) 5-325 MG tablet Take 1 tablet by mouth every 6 (six) hours as needed for moderate pain. 06/03/21   Isaac Bliss, Rayford Halsted, MD  metFORMIN (GLUCOPHAGE) 500 MG tablet Take 1 tablet (500 mg total) by mouth at bedtime. 02/28/21   Isaac Bliss, Rayford Halsted, MD  metoprolol succinate (TOPROL-XL) 50 MG 24 hr tablet TAKE 1 TABLET BY MOUTH EVERY DAY 02/20/21   Troy Sine, MD  oxyCODONE (OXY IR/ROXICODONE) 5 MG immediate release tablet Take 1-2 tablets (5-10 mg total) by mouth every 6 (six) hours as needed for severe pain. 12/10/20   Fenton Foy D, PA-C  rivaroxaban (XARELTO) 20 MG TABS tablet Take 1 tablet (20 mg total) by mouth daily with supper. 08/06/21   Isaac Bliss, Rayford Halsted, MD  traMADol (ULTRAM) 50 MG tablet TAKE 1 TABLET (50 MG TOTAL) BY MOUTH EVERY 12 (TWELVE) HOURS AS NEEDED. Patient taking differently: Take 50 mg by mouth every 12 (twelve) hours as needed (PAIN.). 08/02/20   Isaac Bliss, Rayford Halsted, MD  WIXELA INHUB 250-50 MCG/DOSE AEPB INHALE 1 PUFF BY MOUTH EVERY 12 HOURS Patient taking differently: Inhale 1 puff into the lungs 2 (two) times daily as needed (congestion/difficulty breathing.). 04/04/19   Isaac Bliss, Rayford Halsted, MD     Allergies    Clonidine and Tramadol  Review of Systems   Review of Systems  Constitutional:  Negative for chills, diaphoresis, fatigue and fever.  HENT:  Positive for trouble swallowing and voice change. Negative for congestion and rhinorrhea.   Eyes:  Negative for visual disturbance.  Respiratory:  Positive for shortness of breath. Negative for cough, hemoptysis, chest tightness and wheezing.   Cardiovascular:  Negative for chest pain and leg swelling.  Gastrointestinal:  Negative for abdominal pain, constipation, diarrhea, nausea and vomiting.  Genitourinary:  Negative for flank pain.  Musculoskeletal:  Positive for neck pain. Negative for back pain.  Skin:  Negative for rash and wound.  Neurological:  Negative for dizziness, weakness, light-headedness, numbness and headaches.  Psychiatric/Behavioral:  Negative for agitation.   All other systems reviewed and are negative.  Physical Exam Updated Vital Signs BP (!) 155/92 (BP Location: Right Arm)   Pulse 81   Temp 98.7 F (37.1 C)   Resp 15   SpO2 95%   Physical Exam Vitals and nursing note reviewed.  Constitutional:      General: She is not in acute distress.    Appearance: She is well-developed. She is ill-appearing. She is not toxic-appearing or diaphoretic.  HENT:     Head: Normocephalic and atraumatic.     Jaw: Trismus, tenderness, swelling and pain on movement present.      Mouth/Throat:     Mouth: Mucous membranes are moist.     Pharynx: Uvula midline. No oropharyngeal exudate, posterior oropharyngeal erythema or uvula swelling.     Tonsils: No tonsillar exudate.     Comments: Difficult to see back of throat that with significant depression use, midline uvula and without evidence of PTA or RPA. Eyes:     Conjunctiva/sclera: Conjunctivae normal.  Cardiovascular:     Rate and Rhythm: Normal rate and regular rhythm.     Heart sounds: No murmur heard. Pulmonary:     Effort: Pulmonary effort is normal. No  respiratory distress.     Breath sounds: Normal breath sounds. No decreased breath sounds, wheezing, rhonchi or rales.  Chest:     Chest wall: No tenderness.  Abdominal:     Palpations: Abdomen is soft.     Tenderness: There is no abdominal tenderness.  Musculoskeletal:        General: No swelling.  Skin:    General: Skin is warm and dry.     Capillary Refill: Capillary refill takes less than 2 seconds.     Findings: Erythema present.  Neurological:     General: No focal deficit present.     Mental Status: She is alert.  Psychiatric:        Mood and Affect: Mood is anxious.      ED Results / Procedures / Treatments   Labs (all labs ordered are listed, but only abnormal results are displayed) Labs Reviewed  CBC WITH DIFFERENTIAL/PLATELET - Abnormal; Notable for the following components:      Result Value   WBC 12.0 (*)    Neutro Abs 7.9 (*)    All other components within normal limits  BASIC METABOLIC PANEL - Abnormal; Notable for the following components:   Potassium 3.1 (*)    Glucose, Bld 126 (*)    All other components within normal limits  CULTURE, BLOOD (ROUTINE X 2)  CULTURE, BLOOD (ROUTINE X 2)  RESP PANEL BY RT-PCR (FLU A&B, COVID) ARPGX2  LACTIC ACID, PLASMA  LACTIC ACID, PLASMA    EKG None  Radiology CT Soft Tissue Neck W Contrast  Result Date: 09/03/2021 CLINICAL DATA:  Neck mass, initial workup EXAM: CT NECK WITH CONTRAST TECHNIQUE: Multidetector CT imaging of the neck was performed using the standard protocol following the bolus administration of intravenous contrast. CONTRAST:  1mL OMNIPAQUE IOHEXOL 350 MG/ML SOLN COMPARISON:  None. FINDINGS: Pharynx and larynx: Unremarkable.  No mass or swelling. Salivary glands: Parotid glands are unremarkable. Thyroid: Normal. Lymph nodes: No enlarged or abnormal density nodes. Vascular: Major neck vessels are patent. Partially retropharyngeal course of the carotids. Mild calcified plaque at the common carotid  bifurcations. Limited intracranial: No abnormal enhancement. Visualized orbits:  No significant abnormality. Mastoids and visualized paranasal sinuses: No significant opacification. Skeleton: Cervical spine degenerative changes, greatest at C4-C5 and C5-C6. Upper chest: Unremarkable. Other: Infiltration of the fat in the left submandibular region. IMPRESSION: Inflammatory changes in the left submandibular region. May reflect cellulitis. The submandibular gland appears unremarkable but sialoadenitis is another consideration. Electronically Signed   By: Macy Mis M.D.   On: 09/03/2021 12:10    Procedures Procedures   CRITICAL CARE Performed by: Gwenyth Allegra Mairely Foxworth Total critical care time: 35 minutes Critical care time was exclusive of separately billable procedures and treating other patients. Critical care was necessary to treat or prevent imminent or life-threatening deterioration. Critical care was time spent personally by me on the following activities: development of treatment plan with patient and/or surrogate as well as nursing, discussions with consultants, evaluation of patient's response to treatment, examination of patient, obtaining history from patient or surrogate, ordering and performing treatments and interventions, ordering and review of laboratory studies, ordering and review of radiographic studies, pulse oximetry and re-evaluation of patient's condition.   Medications Ordered in ED Medications  morphine 4 MG/ML injection 4 mg (has no administration in time range)  dexamethasone (DECADRON) injection 10 mg (has no administration in time range)    ED Course  I have reviewed the triage vital signs and the nursing notes.  Pertinent labs & imaging results that were available during my care of the patient were reviewed by me and considered in my medical decision making (see chart for details).    MDM Rules/Calculators/A&P                           OFA ISSA is a 80  y.o. female with a past medical history significant for hypertension, diabetes, previous DVT on Xarelto, asthma, GERD, and recent diagnosis of neck cellulitis on Augmentin who presents with worsened neck swelling, neck pain, now difficulty swallowing, occasional difficulty breathing, and voice change.  According to patient, she has been on the Augmentin for the last 2 days and symptoms are only worsening.  She reports that her voice now sounds different and she cannot swallow foods.  She is able to still small some fluids.  She reports some shortness of breath at times depending on positioning.  She thinks it now went from the left side and now the midline and is hurting worse.  She has difficulty opening her jaw fully.  She denies any posterior neck pain and denies any chest pain, palpitations, or cough.  Denies nausea, vomiting, fevers, chills.  Denies any urinary or GI symptoms.  Reports the pain is "11 out of 10".  Denies other complaints.  On exam, lungs clear and chest nontender.  I did not appreciate stridor on my auscultation of the neck however patient has significant swelling of the submandibular and neck area.  It is slightly erythematous and very tender.  With a tongue depressor I was able to the back of the throat but patient does feel that her tongue is pushed up slightly.  No evidence of PTA or RPA initially.  Patient can move her neck.  Abdomen nontender.  Exam otherwise unremarkable.  Clinically I am concerned about worsened submandibular infection that could be nearing a Ludewig's angina type picture.  We will give a dose of IV steroids again and get a repeat CT scan.  We will get other labs.  Anticipate discussion with ENT about management but given her worsening symptoms despite  outpatient antibiotics, anticipate admission.  At this time, patient appears protecting her airway and is not tachycardic, hypotensive, hypoxic, or febrile.  If symptoms were to worsen with her breathing, will  consider intubation.  12:56 PM CT was completed and I called radiology myself to speak with them about it.  They do not see any new drainable abscess but do see progression of the cellulitis with submandibular swelling.  They do not see any evidence of airway compromise.  Will call ENT to discuss management but anticipate admission for IV antibiotics and further management.  1:08 PM Spoke to Dr. Janace Hoard with the ENT who will come see the patient.  He agreed with medicine admission as there was not a drainable abscess at this time.  Anticipate she will need a stepdown bed to monitor her breathing although I do not feel she needs intubation at this time.  Will speak to pharmacy about most appropriate antibiotics.    Final Clinical Impression(s) / ED Diagnoses Final diagnoses:  Cellulitis of neck     Clinical Impression: 1. Cellulitis of neck     Disposition: Admit  This note was prepared with assistance of Dragon voice recognition software. Occasional wrong-word or sound-a-like substitutions may have occurred due to the inherent limitations of voice recognition software.     Leverett Camplin, Gwenyth Allegra, MD 09/05/21 [phone]

## 2021-09-05 NOTE — Telephone Encounter (Signed)
Patient is aware.  Patient is currently at the ED.

## 2021-09-06 DIAGNOSIS — I10 Essential (primary) hypertension: Secondary | ICD-10-CM

## 2021-09-06 DIAGNOSIS — I82511 Chronic embolism and thrombosis of right femoral vein: Secondary | ICD-10-CM

## 2021-09-06 DIAGNOSIS — E119 Type 2 diabetes mellitus without complications: Secondary | ICD-10-CM

## 2021-09-06 LAB — CBC
HCT: 32.4 % — ABNORMAL LOW (ref 36.0–46.0)
Hemoglobin: 10.7 g/dL — ABNORMAL LOW (ref 12.0–15.0)
MCH: 29.6 pg (ref 26.0–34.0)
MCHC: 33 g/dL (ref 30.0–36.0)
MCV: 89.8 fL (ref 80.0–100.0)
Platelets: 309 10*3/uL (ref 150–400)
RBC: 3.61 MIL/uL — ABNORMAL LOW (ref 3.87–5.11)
RDW: 12.6 % (ref 11.5–15.5)
WBC: 8.3 10*3/uL (ref 4.0–10.5)
nRBC: 0 % (ref 0.0–0.2)

## 2021-09-06 LAB — GLUCOSE, CAPILLARY
Glucose-Capillary: 149 mg/dL — ABNORMAL HIGH (ref 70–99)
Glucose-Capillary: 142 mg/dL — ABNORMAL HIGH (ref 70–99)
Glucose-Capillary: 174 mg/dL — ABNORMAL HIGH (ref 70–99)
Glucose-Capillary: 141 mg/dL — ABNORMAL HIGH (ref 70–99)
Glucose-Capillary: 152 mg/dL — ABNORMAL HIGH (ref 70–99)
Glucose-Capillary: 173 mg/dL — ABNORMAL HIGH (ref 70–99)

## 2021-09-06 LAB — BASIC METABOLIC PANEL
Anion gap: 9 (ref 5–15)
BUN: 12 mg/dL (ref 8–23)
CO2: 27 mmol/L (ref 22–32)
Calcium: 8.8 mg/dL — ABNORMAL LOW (ref 8.9–10.3)
Chloride: 101 mmol/L (ref 98–111)
Creatinine, Ser: 0.61 mg/dL (ref 0.44–1.00)
GFR, Estimated: >60 mL/min (ref >60)
Glucose, Bld: 155 mg/dL — ABNORMAL HIGH (ref 70–99)
Potassium: 3.4 mmol/L — ABNORMAL LOW (ref 3.5–5.1)
Sodium: 137 mmol/L (ref 135–145)

## 2021-09-06 MED ORDER — POTASSIUM CHLORIDE CRYS ER 10 MEQ PO TBCR
40.0000 meq | EXTENDED_RELEASE_TABLET | Freq: Once | ORAL | Status: AC
  Administered 2021-09-06: 40 meq via ORAL
  Filled 2021-09-06: qty 4

## 2021-09-06 MED ORDER — INSULIN ASPART 100 UNIT/ML IJ SOLN
4.0000 [IU] | Freq: Three times a day (TID) | INTRAMUSCULAR | Status: DC
  Administered 2021-09-06 – 2021-09-11 (×8): 4 [IU] via SUBCUTANEOUS

## 2021-09-06 MED ORDER — INSULIN ASPART 100 UNIT/ML IJ SOLN
0.0000 [IU] | Freq: Three times a day (TID) | INTRAMUSCULAR | Status: DC
  Administered 2021-09-06: 3 [IU] via SUBCUTANEOUS
  Administered 2021-09-06 – 2021-09-09 (×5): 2 [IU] via SUBCUTANEOUS
  Administered 2021-09-10 (×2): 8 [IU] via SUBCUTANEOUS
  Administered 2021-09-10 – 2021-09-11 (×2): 2 [IU] via SUBCUTANEOUS
  Administered 2021-09-11: 3 [IU] via SUBCUTANEOUS
  Administered 2021-09-11: 5 [IU] via SUBCUTANEOUS

## 2021-09-06 MED ORDER — VANCOMYCIN HCL 1500 MG/300ML IV SOLN
1500.0000 mg | Freq: Once | INTRAVENOUS | Status: AC
  Administered 2021-09-06: 1500 mg via INTRAVENOUS
  Filled 2021-09-06: qty 300

## 2021-09-06 MED ORDER — INSULIN ASPART 100 UNIT/ML IJ SOLN: 0.0000 [IU] | Freq: Every day | INTRAMUSCULAR | Status: DC

## 2021-09-06 MED ORDER — VANCOMYCIN HCL 1000 MG/200ML IV SOLN
1000.0000 mg | INTRAVENOUS | Status: DC
  Administered 2021-09-07 – 2021-09-11 (×5): 1000 mg via INTRAVENOUS
  Filled 2021-09-06 (×6): qty 200

## 2021-09-06 MED ORDER — BENAZEPRIL HCL 20 MG PO TABS
10.0000 mg | ORAL_TABLET | Freq: Every day | ORAL | Status: DC
  Administered 2021-09-06 – 2021-09-12 (×7): 10 mg via ORAL
  Filled 2021-09-06 (×7): qty 1

## 2021-09-06 NOTE — Progress Notes (Signed)
[facility] PROGRESS NOTE    Progress Note  Kristy [name]  KKX:[id number] DOB: [birth year] DOA: 09/05/2021 PCP: Philip Aspen, Limmie Patricia, MD     Brief Narrative:   Kristy [name] is an 80 y.o. female past medical history significant for diabetes mellitus type 2 asthma was initially seen on 09/01/2021 and was diagnosed with a peritonsillar abscess and treated with Rocephin Augmentin and Decadron, follow-up with her PCP who referred her to the ED CT of the head and neck show early cellulitis and possible salivary duct stone started empirically on vancomycin and continue on Augmentin was unable to follow-up with ENT got up on the day of admission who was unable to tolerate solids came back to the ED repeated CT scan showed progressive infection no airway compromise ENT Dr. Lorri Frederick was consulted started on Unasyn and Decadron   Assessment/Plan:   Cellulitis of neck: CT of the head and neck done on 09/05/2021 showed progressive cellulitis, progressive probably reactive lymph node no airway compromise.  Blood cultures have been sent. ENT was consulted recommended to continue dexamethasone and antibiotic coverage. She has remained afebrile leukocytosis improved currently on IV Unasyn,  Will add IV vancomycin to cover for staph. No respiratory stridor able to swallow, she has no salivation. Sats have remained greater 90% on room air she is not septic appearing. Out of bed to chair Consult physical therapy.  Diabetes mellitus type 2: Last A1c of 5.8, she was given dexamethasone and continues to be on Decadron which will make her blood glucose erratic. Continue sliding scale insulin monitor closely.  Essential hypertension: Continue Norvasc and Toprol, pressure is trending up we will add ACE inhibitor her home dose.  Asthma: Continue inhalers appears to be compensated.  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  Goals of care: She prefers to be a DNR however  in the case her airway becomes compromised she is willing to forego a DNR and be intubated temporarily.    DVT prophylaxis: lovenox Family Communication:none Status is: Inpatient  Remains inpatient appropriate because: Acute cellulitis with respiratory compromise initially.        Code Status:     Code Status Orders  (From admission, onward)           Start     Ordered   09/05/21 1407  Limited resuscitation (code)  Continuous       Question Answer Comment  In the event of cardiac or respiratory ARREST: Initiate Code Blue, Call Rapid Response No   In the event of cardiac or respiratory ARREST: Perform CPR No   In the event of cardiac or respiratory ARREST: Perform Intubation/Mechanical Ventilation Yes   In the event of cardiac or respiratory ARREST: Use NIPPV/BiPAp only if indicated No   In the event of cardiac or respiratory ARREST: Administer ACLS medications if indicated No   In the event of cardiac or respiratory ARREST: Perform Defibrillation or Cardioversion if indicated No      09/05/21 1412           Code Status History     Date Active Date Inactive Code Status Order ID Comments User Context   12/09/2020 1610 12/10/2020 1919 Full Code [id number]  Ronita Hipps Inpatient   06/24/2018 1743 06/26/2018 1719 Full Code [id number]  Kathryne Hitch, MD Inpatient         IV Access:   Peripheral IV   Procedures and diagnostic studies:   CT Soft Tissue Neck W Contrast  Result Date: 09/05/2021 CLINICAL DATA:  Cellulitis with progressive soft tissue swelling. On antibiotics. EXAM: CT NECK WITH CONTRAST TECHNIQUE: Multidetector CT imaging of the neck was performed using the standard protocol following the bolus administration of intravenous contrast. CONTRAST:  55mL OMNIPAQUE IOHEXOL 300 MG/ML  SOLN COMPARISON:  CT neck 09/03/2021 FINDINGS: Pharynx and larynx: No pharyngeal mass or abscess. Airway intact. Epiglottis and larynx normal. Effacement of  the left piriform sinus due to submucosal edema of the left pharynx. Salivary glands: There is stranding surrounding the left submandibular gland however the left submandibular gland is normal without stone mass or abscess. Right submandibular gland normal. Parotid normal bilaterally. Thyroid: Negative Lymph nodes: Scattered submandibular lymph nodes bilaterally left greater than right with mild progression since the prior study. These appear reactive due to cellulitis. Subcentimeter level 2 lymph nodes on the left also likely reactive. These are similar to the prior study. Vascular: Normal vascular enhancement. Limited intracranial: Negative Visualized orbits: No orbital mass or edema. Bilateral cataract extraction Mastoids and visualized paranasal sinuses: Negative Skeleton: No acute skeletal abnormality. No dental abscess identified. Upper chest: Lung apices clear bilaterally. Other: Progression of subcutaneous edema in the submandibular region. This is more prominent the left but does cross the midline to the right. No focal abscess. There is edema surrounding the left submandibular gland and also extending posterior to the sternocleidomastoid muscle on the left. Hyperenhancing submandibular lymph nodes left greater than right have progressed in the interval. IMPRESSION: 1. Progression of cellulitis in the submandibular region, asymmetric to the left. Progression of reactive lymph nodes in the left submandibular region. No abscess 2. No airway compromise identified. There is mild edema effacing the left piriform sinus Electronically Signed   By: Franchot Gallo M.D.   On: 09/05/2021 13:03     Medical Consultants:   None.   Subjective:    [name] denies she is significantly better at upper swallow tolerating liquids.  Objective:    Vitals:   09/05/21 1954 09/05/21 2319 09/06/21 0400 09/06/21 0724  BP: (!) 143/77 105/72 (!) 152/73 (!) 149/80  Pulse:  72 76 77  Resp:  14 19 18   Temp: 97.7  F (36.5 C) 97.8 F (36.6 C) 97.9 F (36.6 C) 98.7 F (37.1 C)  TempSrc: Oral Oral Oral Oral  SpO2:  94% 95% 98%   SpO2: 98 %   Intake/Output Summary (Last 24 hours) at 09/06/2021 0731 Last data filed at 09/06/2021 0400 Gross per 24 hour  Intake 978 ml  Output 1 ml  Net 977 ml   There were no vitals filed for this visit.  Exam: General exam: In no acute distress, asymmetric facial swelling on the left submandibular region. Respiratory system: Good air movement and clear to auscultation. Cardiovascular system: S1 & S2 heard, RRR.  Gastrointestinal system: Abdomen is nondistended, soft and nontender.  Extremities: No pedal edema. Skin: No rashes, lesions or ulcers Psychiatry: Judgement and insight appear normal. Mood & affect appropriate.    Data Reviewed:    Labs: Basic Metabolic Panel: Recent Labs  Lab 09/03/21 1050 09/05/21 0648 09/06/21 0301  NA 143 136 137  K 3.5 3.1* 3.4*  CL 103 99 101  CO2 28 24 27   GLUCOSE 104* 126* 155*  BUN 23 17 12   CREATININE 0.77 0.83 0.61  CALCIUM 10.4* 9.5 8.8*   GFR Estimated Creatinine Clearance: 49.1 mL/min (by C-G formula based on SCr of 0.61 mg/dL). Liver Function Tests: No results for input(s): AST, ALT, ALKPHOS, BILITOT,  PROT, ALBUMIN in the last 168 hours. No results for input(s): LIPASE, AMYLASE in the last 168 hours. No results for input(s): AMMONIA in the last 168 hours. Coagulation profile No results for input(s): INR, PROTIME in the last 168 hours. COVID-19 Labs  No results for input(s): DDIMER, FERRITIN, LDH, CRP in the last 72 hours.  Lab Results  Component Value Date   SARSCOV2NAA NEGATIVE 09/05/2021   [place] NEGATIVE 12/05/2020   SARSCOV2NAA NOT DETECTED 04/19/2019    CBC: Recent Labs  Lab 09/03/21 1050 09/05/21 0648 09/06/21 0301  WBC 15.2* 12.0* 8.3  NEUTROABS 11.2* 7.9*  --   HGB 13.5 12.0 10.7*  HCT 41.1 37.6 32.4*  MCV 91.3 93.3 89.8  PLT 361 384 309   Cardiac Enzymes: No  results for input(s): CKTOTAL, CKMB, CKMBINDEX, TROPONINI in the last 168 hours. BNP (last 3 results) No results for input(s): PROBNP in the last 8760 hours. CBG: Recent Labs  Lab 09/05/21 2004 09/06/21 0358 09/06/21 0723  GLUCAP 198* 149* 142*   D-Dimer: No results for input(s): DDIMER in the last 72 hours. Hgb A1c: Recent Labs    09/03/21 0941  HGBA1C 5.8*   Lipid Profile: No results for input(s): CHOL, HDL, LDLCALC, TRIG, CHOLHDL, LDLDIRECT in the last 72 hours. Thyroid function studies: No results for input(s): TSH, T4TOTAL, T3FREE, THYROIDAB in the last 72 hours.  Invalid input(s): FREET3 Anemia work up: No results for input(s): VITAMINB12, FOLATE, FERRITIN, TIBC, IRON, RETICCTPCT in the last 72 hours. Sepsis Labs: Recent Labs  Lab 09/03/21 1050 09/05/21 0648 09/05/21 1210 09/05/21 1749 09/06/21 0301  WBC 15.2* 12.0*  --   --  8.3  LATICACIDVEN  --   --  1.6 1.3  --    Microbiology Recent Results (from the past 240 hour(s))  Resp Panel by RT-PCR (Flu A&B, Covid) Peripheral     Status: None   Collection Time: 09/05/21 11:18 AM   Specimen: Peripheral; Nasopharyngeal(NP) swabs in vial transport medium  Result Value Ref Range Status   SARS Coronavirus 2 by RT PCR NEGATIVE NEGATIVE Final    Comment: (NOTE) SARS-CoV-2 target nucleic acids are NOT DETECTED.  The SARS-CoV-2 RNA is generally detectable in upper respiratory specimens during the acute phase of infection. The lowest concentration of SARS-CoV-2 viral copies this assay can detect is 138 copies/mL. A negative result does not preclude SARS-Cov-2 infection and should not be used as the sole basis for treatment or other patient management decisions. A negative result may occur with  improper specimen collection/handling, submission of specimen other than nasopharyngeal swab, presence of viral mutation(s) within the areas targeted by this assay, and inadequate number of viral copies(<138 copies/mL). A  negative result must be combined with clinical observations, patient history, and epidemiological information. The expected result is Negative.  Fact Sheet for Patients:  EntrepreneurPulse.com.au  Fact Sheet for Healthcare Providers:  IncredibleEmployment.be  This test is no t yet approved or cleared by the [place] FDA and  has been authorized for detection and/or diagnosis of SARS-CoV-2 by FDA under an Emergency Use Authorization (EUA). This EUA will remain  in effect (meaning this test can be used) for the duration of the COVID-19 declaration under Section 564(b)(1) of the Act, 21 U.S.C.section 360bbb-3(b)(1), unless the authorization is terminated  or revoked sooner.       Influenza A by PCR NEGATIVE NEGATIVE Final   Influenza B by PCR NEGATIVE NEGATIVE Final    Comment: (NOTE) The Xpert Xpress SARS-CoV-2/FLU/RSV plus assay is intended as  an aid in the diagnosis of influenza from Nasopharyngeal swab specimens and should not be used as a sole basis for treatment. Nasal washings and aspirates are unacceptable for Xpert Xpress SARS-CoV-2/FLU/RSV testing.  Fact Sheet for Patients: EntrepreneurPulse.com.au  Fact Sheet for Healthcare Providers: IncredibleEmployment.be  This test is not yet approved or cleared by the [place] FDA and has been authorized for detection and/or diagnosis of SARS-CoV-2 by FDA under an Emergency Use Authorization (EUA). This EUA will remain in effect (meaning this test can be used) for the duration of the COVID-19 declaration under Section 564(b)(1) of the Act, 21 U.S.C. section 360bbb-3(b)(1), unless the authorization is terminated or revoked.  Performed at [facility], [place] [address], [place], [name] [address]      Medications:    amLODipine  10 mg Oral Daily   docusate sodium  100 mg Oral BID   insulin aspart  0-15 Units Subcutaneous TID WC    metoprolol succinate  50 mg Oral Daily   mometasone-formoterol  2 puff Inhalation BID   rivaroxaban  20 mg Oral Q supper   sodium chloride flush  3 mL Intravenous Q12H   Continuous Infusions:  ampicillin-sulbactam (UNASYN) IV 3 g (09/06/21 0122)   lactated ringers 75 mL/hr at 09/05/21 1443      LOS: 1 day   Charlynne Cousins  [facility]  09/06/2021, 7:31 AM

## 2021-09-06 NOTE — Evaluation (Addendum)
Physical Therapy Evaluation & Discharge Patient Details Name: Kristy [name] MRN: [id number] DOB: [birth year] Today's Date: 09/06/2021  History of Present Illness  80 y/o female presented to ED on 11/18 for neck swelling worsening, trouble swallowing, and SOB. Recently seen in ED on 11/14 & 11/16 for similar symptoms with CT of neck showing submandibular cellulitis vs possible salivary duct stone and was prescribed antibiotics and sent home. PMH: DM, asthma, HTN  Clinical Impression  Patient admitted with above diagnosis. Patient functioning at baseline and is modI for mobility. Patient able to negotiate stairs at modI level with use of rail. Patient is active in dancing and bowling at baseline. No further skilled PT needs required acutely. No PT follow up recommended at this time.      Recommendations for follow up therapy are one component of a multi-disciplinary discharge planning process, led by the attending physician.  Recommendations may be updated based on patient status, additional functional criteria and insurance authorization.  Follow Up Recommendations No PT follow up    Assistance Recommended at Discharge None  Functional Status Assessment Patient has not had a recent decline in their functional status  Equipment Recommendations  None recommended by PT    Recommendations for Other Services       Precautions / Restrictions Precautions Precautions: None Restrictions Weight Bearing Restrictions: No      Mobility  Bed Mobility               General bed mobility comments: in recliner on arrival    Transfers Overall transfer level: Modified independent Equipment used: None                    Ambulation/Gait Ambulation/Gait assistance: Modified independent (Device/Increase time) Gait Distance (Feet): 250 Feet Assistive device: None Gait Pattern/deviations: Step-through pattern;Decreased stance time - right       General Gait Details: no physical  assist needed. Patient reports walking with limp on R side since TKA  Stairs Stairs: Yes Stairs assistance: Modified independent (Device/Increase time) Stair Management: One rail Left;Step to pattern;Forwards Number of Stairs: 3    Wheelchair Mobility    Modified Rankin (Stroke Patients Only)       Balance Overall balance assessment: Mild deficits observed, not formally tested                                           Pertinent Vitals/Pain Pain Assessment: Faces Faces Pain Scale: Hurts a little bit Pain Location: neck Pain Descriptors / Indicators: Grimacing Pain Intervention(s): Monitored during session    Home Living Family/patient expects to be discharged to:: Private residence Living Arrangements: Alone Available Help at Discharge: Family Type of Home: House Home Access: Stairs to enter Entrance Stairs-Rails: Can reach both Entrance Stairs-Number of Steps: 3 Alternate Level Stairs-Number of Steps: 16 Home Layout: Two level;Able to live on main level with bedroom/bathroom;Bed/bath upstairs Home Equipment: Agricultural consultant (2 wheels);Cane - single point      Prior Function Prior Level of Function : Independent/Modified Independent;Driving             Mobility Comments: dances and bowls       Hand Dominance        Extremity/Trunk Assessment   Upper Extremity Assessment Upper Extremity Assessment: Overall WFL for tasks assessed (R UE weaker and limited ROM due to recent shoulder fx)    Lower Extremity  Assessment Lower Extremity Assessment: Overall WFL for tasks assessed    Cervical / Trunk Assessment Cervical / Trunk Assessment: Normal  Communication   Communication: No difficulties  Cognition Arousal/Alertness: Awake/alert Behavior During Therapy: WFL for tasks assessed/performed Overall Cognitive Status: Within Functional Limits for tasks assessed                                          General Comments       Exercises     Assessment/Plan    PT Assessment Patient does not need any further PT services  PT Problem List         PT Treatment Interventions      PT Goals (Current goals can be found in the Care Plan section)  Acute Rehab PT Goals Patient Stated Goal: to go home PT Goal Formulation: All assessment and education complete, DC therapy    Frequency     Barriers to discharge        Co-evaluation               AM-PAC PT "6 Clicks" Mobility  Outcome Measure Help needed turning from your back to your side while in a flat bed without using bedrails?: None Help needed moving from lying on your back to sitting on the side of a flat bed without using bedrails?: None Help needed moving to and from a bed to a chair (including a wheelchair)?: None Help needed standing up from a chair using your arms (e.g., wheelchair or bedside chair)?: None Help needed to walk in hospital room?: None Help needed climbing 3-5 steps with a railing? : None 6 Click Score: 24    End of Session   Activity Tolerance: Patient tolerated treatment well Patient left: in chair;with call bell/phone within reach;with chair alarm set Nurse Communication: Mobility status PT Visit Diagnosis: Unsteadiness on feet (R26.81)    Time: 6759-1638 PT Time Calculation (min) (ACUTE ONLY): 24 min   Charges:   PT Evaluation $PT Eval Low Complexity: 1 Low          Weber Monnier A. Dan Humphreys PT, DPT Acute Rehabilitation Services Pager [phone] Office [phone]   Viviann Spare 09/06/2021, 11:44 AM

## 2021-09-06 NOTE — Progress Notes (Addendum)
Patient ID: Kristy [name], female   DOB: 08/12/1941, 80 y.o.   MRN: [id number] Subjective: Doing much better.  She feels like the swelling in her mouth is better and her neck is less tender.  Objective: Vital signs in last 24 hours: Temp:  [97.7 F (36.5 C)-98.7 F (37.1 C)] 98.7 F (37.1 C) (11/19 0724) Pulse Rate:  [72-98] 77 (11/19 0724) Resp:  [13-21] 18 (11/19 0724) BP: (105-164)/(68-92) 149/80 (11/19 0724) SpO2:  [94 %-100 %] 98 % (11/19 0724)  Physical exam-the floor mouth has no further watery edema.  It still slightly swollen.  The neck is still tender to palpation but seems like there is a slight less swelling.  No breathing issues.  No change in her voice.  @LABLAST2 (wbc:2,hgb:2,hct:2,plt:2) Recent Labs    09/05/21 0648 09/06/21 0301  NA 136 137  K 3.1* 3.4*  CL 99 101  CO2 24 27  GLUCOSE 126* 155*  BUN 17 12  CREATININE 0.83 0.61  CALCIUM 9.5 8.8*    Intake/Output Summary (Last 24 hours) at 09/06/2021 0925 Last data filed at 09/06/2021 0400 Gross per 24 hour  Intake 978 ml  Output 1 ml  Net 977 ml     Medications: I have reviewed the patient's current medications.  Assessment/Plan: Submandibular cellulitis-she seems to be improving.  There is no further significant swelling in the floor mouth.  She is improving with the neck tenderness.  She is eating well.  She will continue the current regiment and as long as she continues on this path no intervention should be necessary.  I would recommend at least another 24 hours of intravenous antibiotics for more improvement.   LOS: 1 day   09/08/2021 09/06/2021, 9:25 AM

## 2021-09-06 NOTE — Progress Notes (Addendum)
Pharmacy Antibiotic Note  Kristy [name] is a 80 y.o. female recently diagnosed with peritonsillar abscess on 11/14 and treated with Rocephin and then Augmentin.  Patient returned to the ED on 11/16 and received one dose of vancomycin and continued on Augmentin.  Now admitted on 09/05/21 with neck cellulitis.  She was started on Unasyn and Pharmacy has been consulted for vancomycin dosing.  Renal function stable, afebrile, WBC WNL.  Plan: Vanc 1500mg  IV x 1, then 1gm IV Q24H for AUC 505 using SCr 0.8 Unasyn 3gm IV Q6H per MD Monitor renal fxn, clinical progress, vanc levels as indicated KCL PO x 1     Temp (24hrs), Avg:98.1 F (36.7 C), Min:97.7 F (36.5 C), Max:98.7 F (37.1 C)  Recent Labs  Lab 09/03/21 1050 09/05/21 0648 09/05/21 1210 09/05/21 1749 09/06/21 0301  WBC 15.2* 12.0*  --   --  8.3  CREATININE 0.77 0.83  --   --  0.61  LATICACIDVEN  --   --  1.6 1.3  --     Estimated Creatinine Clearance: 49.1 mL/min (by C-G formula based on SCr of 0.61 mg/dL).    Allergies  Allergen Reactions   Clonidine Hives   Vanc 11/19 >> Unasyn 11/18 >>   11/18 BCx -   Kristy [name] D. 12/18, PharmD, BCPS, BCCCP 09/06/2021, 8:31 AM

## 2021-09-07 ENCOUNTER — Inpatient Hospital Stay (HOSPITAL_COMMUNITY): Payer: Medicare Other

## 2021-09-07 LAB — BASIC METABOLIC PANEL
Anion gap: 8 (ref 5–15)
BUN: 11 mg/dL (ref 8–23)
CO2: 29 mmol/L (ref 22–32)
Calcium: 9.5 mg/dL (ref 8.9–10.3)
Chloride: 105 mmol/L (ref 98–111)
Creatinine, Ser: 0.63 mg/dL (ref 0.44–1.00)
GFR, Estimated: >60 mL/min (ref >60)
Glucose, Bld: 97 mg/dL (ref 70–99)
Potassium: 3.2 mmol/L — ABNORMAL LOW (ref 3.5–5.1)
Sodium: 142 mmol/L (ref 135–145)

## 2021-09-07 LAB — GLUCOSE, CAPILLARY
Glucose-Capillary: 97 mg/dL (ref 70–99)
Glucose-Capillary: 118 mg/dL — ABNORMAL HIGH (ref 70–99)
Glucose-Capillary: 149 mg/dL — ABNORMAL HIGH (ref 70–99)
Glucose-Capillary: 57 mg/dL — ABNORMAL LOW (ref 70–99)
Glucose-Capillary: 161 mg/dL — ABNORMAL HIGH (ref 70–99)

## 2021-09-07 MED ORDER — OXYCODONE HCL 5 MG PO TABS
5.0000 mg | ORAL_TABLET | Freq: Four times a day (QID) | ORAL | Status: DC | PRN
Start: 2021-09-07 — End: 2021-09-08
  Administered 2021-09-07 – 2021-09-08 (×3): 10 mg via ORAL
  Filled 2021-09-07 (×3): qty 2

## 2021-09-07 MED ORDER — KETOROLAC TROMETHAMINE 15 MG/ML IJ SOLN: 15.0000 mg | Freq: Four times a day (QID) | INTRAMUSCULAR | Status: DC | PRN

## 2021-09-07 MED ORDER — TRAMADOL HCL 50 MG PO TABS
50.0000 mg | ORAL_TABLET | Freq: Two times a day (BID) | ORAL | Status: DC | PRN
  Administered 2021-09-07: 50 mg via ORAL
  Filled 2021-09-07: qty 1

## 2021-09-07 MED ORDER — HYDROCHLOROTHIAZIDE 12.5 MG PO TABS
12.5000 mg | ORAL_TABLET | Freq: Every day | ORAL | Status: DC
  Administered 2021-09-07 – 2021-09-12 (×6): 12.5 mg via ORAL
  Filled 2021-09-07 (×6): qty 1

## 2021-09-07 MED ORDER — PANTOPRAZOLE SODIUM 40 MG PO TBEC
40.0000 mg | DELAYED_RELEASE_TABLET | Freq: Every day | ORAL | Status: DC
  Administered 2021-09-07 – 2021-09-12 (×6): 40 mg via ORAL
  Filled 2021-09-07 (×6): qty 1

## 2021-09-07 MED ORDER — POTASSIUM CHLORIDE CRYS ER 20 MEQ PO TBCR
40.0000 meq | EXTENDED_RELEASE_TABLET | Freq: Two times a day (BID) | ORAL | Status: AC
  Administered 2021-09-07 (×2): 40 meq via ORAL
  Filled 2021-09-07 (×2): qty 2

## 2021-09-07 MED ORDER — SODIUM CHLORIDE 0.9 % IV SOLN: INTRAVENOUS | Status: AC

## 2021-09-07 MED ORDER — HYDROMORPHONE HCL 1 MG/ML IJ SOLN
1.0000 mg | INTRAMUSCULAR | Status: DC | PRN
  Administered 2021-09-07 – 2021-09-09 (×5): 1 mg via INTRAVENOUS
  Filled 2021-09-07 (×5): qty 1

## 2021-09-07 MED ORDER — LIDOCAINE VISCOUS HCL 2 % MT SOLN
15.0000 mL | OROMUCOSAL | Status: DC | PRN
  Administered 2021-09-07 – 2021-09-09 (×5): 15 mL via OROMUCOSAL
  Filled 2021-09-07 (×6): qty 15

## 2021-09-07 MED ORDER — MORPHINE SULFATE (PF) 4 MG/ML IV SOLN
4.0000 mg | INTRAVENOUS | Status: DC | PRN
  Administered 2021-09-07: 4 mg via INTRAVENOUS
  Filled 2021-09-07: qty 1

## 2021-09-07 MED ORDER — IOHEXOL 300 MG/ML  SOLN
100.0000 mL | Freq: Once | INTRAMUSCULAR | Status: AC | PRN
  Administered 2021-09-07: 100 mL via INTRAVENOUS

## 2021-09-07 NOTE — Progress Notes (Signed)
Patient ID: Kristy [name], female   DOB: 1941-10-15, 80 y.o.   MRN: [id number] Subjective: Doing worse. She is hurting worse and cannot swallow.   Objective: Vital signs in last 24 hours: Temp:  [97.8 F (36.6 C)-98.8 F (37.1 C)] 97.9 F (36.6 C) (11/20 0750) Pulse Rate:  [72-90] 82 (11/20 0750) Resp:  [15-25] 16 (11/20 0750) BP: (138-171)/(71-83) 171/82 (11/20 0750) SpO2:  [91 %-98 %] 92 % (11/20 0750)  exam She is alert. She is worse. The swelling is about same but her pain in the left submax area is worse.The floor of mouth is without swelling. Tongue normal.   @LABLAST2 (wbc:2,hgb:2,hct:2,plt:2) Recent Labs    09/05/21 0648 09/06/21 0301  NA 136 137  K 3.1* 3.4*  CL 99 101  CO2 24 27  GLUCOSE 126* 155*  BUN 17 12  CREATININE 0.83 0.61  CALCIUM 9.5 8.8*    Intake/Output Summary (Last 24 hours) at 09/07/2021 09/09/2021 Last data filed at 09/06/2021 2230 Gross per 24 hour  Intake 1343 ml  Output --  Net 1343 ml     Medications: I have reviewed the patient's current medications.  Assessment/Plan: Submandibular cellulitis- she is worse and needs another CT scan. The swelling and exam is about the same. I would change antibiotic to cover MRSA and broaden the spectrum.[id number] Zosyn and Vanco maybe.    LOS: 2 days   Marland Kitchen 09/07/2021, 9:56 AM

## 2021-09-07 NOTE — Progress Notes (Signed)
Patient ID: Kristy [name], female   DOB: May 17, 1941, 80 y.o.   MRN: [id number] Ct scan actually looks better in the posterior neck and hypopharynx. The submandibular gland still enlarged but nothing is worse.  I think this is primarily sialadenitis and she may have flared again because of eating with further swelling the gland. There is not any drainable fluid. I agree with changing antibiotics and consider bland diet or just liguids for a day. Heating pad to the left submandibular gland.

## 2021-09-07 NOTE — Progress Notes (Addendum)
[facility] PROGRESS NOTE    Progress Note  Kristy [name]  [id number] DOB: 04/19/1941 DOA: 09/05/2021 PCP: Isaac Bliss, Rayford Halsted, MD     Brief Narrative:   Kristy [name] is an 80 y.o. female past medical history significant for diabetes mellitus type 2 asthma was initially seen on 09/01/2021 and was diagnosed with a peritonsillar abscess and treated with Rocephin Augmentin and Decadron, follow-up with her PCP who referred her to the ED CT of the head and neck show early cellulitis and possible salivary duct stone started empirically on vancomycin and continue on Augmentin was unable to follow-up with ENT got up on the day of admission who was unable to tolerate solids came back to the ED repeated CT scan showed progressive infection no airway compromise ENT Dr. Babette Relic was consulted started on Unasyn and Decadron   Assessment/Plan:   Submandibular cellulitis of neck: CT of the head and neck done on 09/05/2021 showed no abscesses Blood cultures negative till date ENT was consulted recommended to continue dexamethasone and antibiotic coverage. She has remained afebrile leukocytosis improved currently on IV Unasyn,  Will add IV vancomycin to cover for staph. No respiratory stridor able to swallow, she has no salivation. Sa relates she is having neck pain, cannot swallow more swollen this morning. We will get a CT soft tissue neck with contrast to rule out abscess   Diabetes mellitus type 2: Last A1c of 5.8, she was given dexamethasone and continues to be on Decadron which will make her blood glucose erratic. Continue sliding scale insulin monitor closely.  Essential hypertension: Continue Norvasc and Toprol, pressure is trending up we will add ACE inhibitor her home dose.  Asthma: Continue inhalers appears to be compensated.  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  Goals of care: She prefers to be a DNR however in the case her airway becomes  compromised she is willing to forego a DNR and be intubated temporarily.    DVT prophylaxis: lovenox Family Communication:none Status is: Inpatient  Remains inpatient appropriate because: Acute cellulitis with respiratory compromise initially.        Code Status:     Code Status Orders  (From admission, onward)           Start     Ordered   09/05/21 1407  Limited resuscitation (code)  Continuous       Question Answer Comment  In the event of cardiac or respiratory ARREST: Initiate Code Blue, Call Rapid Response No   In the event of cardiac or respiratory ARREST: Perform CPR No   In the event of cardiac or respiratory ARREST: Perform Intubation/Mechanical Ventilation Yes   In the event of cardiac or respiratory ARREST: Use NIPPV/BiPAp only if indicated No   In the event of cardiac or respiratory ARREST: Administer ACLS medications if indicated No   In the event of cardiac or respiratory ARREST: Perform Defibrillation or Cardioversion if indicated No      09/05/21 1412           Code Status History     Date Active Date Inactive Code Status Order ID Comments User Context   12/09/2020 1610 12/10/2020 1919 Full Code [id number]  Carmon Ginsberg Inpatient   06/24/2018 1743 06/26/2018 1719 Full Code [id number]  Mcarthur Rossetti, MD Inpatient         IV Access:   Peripheral IV   Procedures and diagnostic studies:   CT Soft Tissue Neck W Contrast  Result Date: 09/05/2021 CLINICAL DATA:  Cellulitis with progressive soft tissue swelling. On antibiotics. EXAM: CT NECK WITH CONTRAST TECHNIQUE: Multidetector CT imaging of the neck was performed using the standard protocol following the bolus administration of intravenous contrast. CONTRAST:  15mL OMNIPAQUE IOHEXOL 300 MG/ML  SOLN COMPARISON:  CT neck 09/03/2021 FINDINGS: Pharynx and larynx: No pharyngeal mass or abscess. Airway intact. Epiglottis and larynx normal. Effacement of the left piriform sinus due to  submucosal edema of the left pharynx. Salivary glands: There is stranding surrounding the left submandibular gland however the left submandibular gland is normal without stone mass or abscess. Right submandibular gland normal. Parotid normal bilaterally. Thyroid: Negative Lymph nodes: Scattered submandibular lymph nodes bilaterally left greater than right with mild progression since the prior study. These appear reactive due to cellulitis. Subcentimeter level 2 lymph nodes on the left also likely reactive. These are similar to the prior study. Vascular: Normal vascular enhancement. Limited intracranial: Negative Visualized orbits: No orbital mass or edema. Bilateral cataract extraction Mastoids and visualized paranasal sinuses: Negative Skeleton: No acute skeletal abnormality. No dental abscess identified. Upper chest: Lung apices clear bilaterally. Other: Progression of subcutaneous edema in the submandibular region. This is more prominent the left but does cross the midline to the right. No focal abscess. There is edema surrounding the left submandibular gland and also extending posterior to the sternocleidomastoid muscle on the left. Hyperenhancing submandibular lymph nodes left greater than right have progressed in the interval. IMPRESSION: 1. Progression of cellulitis in the submandibular region, asymmetric to the left. Progression of reactive lymph nodes in the left submandibular region. No abscess 2. No airway compromise identified. There is mild edema effacing the left piriform sinus Electronically Signed   By: Franchot Gallo M.D.   On: 09/05/2021 13:03     Medical Consultants:   None.   Subjective:    Kristy [name] relates she is having neck pain.  Worse than yesterday cannot swallow no difficulty breathing  Objective:    Vitals:   09/06/21 1936 09/06/21 2308 09/07/21 0313 09/07/21 0750  BP: 139/76 138/71 140/83 (!) 171/82  Pulse: 84 79 72 82  Resp: 16 (!) 25 (!) 23 16  Temp: 97.8 F  (36.6 C) 97.9 F (36.6 C) 98.1 F (36.7 C) 97.9 F (36.6 C)  TempSrc: Oral Oral Oral Oral  SpO2: 97% 95% 91% 92%   SpO2: 92 %   Intake/Output Summary (Last 24 hours) at 09/07/2021 1002 Last data filed at 09/06/2021 2230 Gross per 24 hour  Intake 1343 ml  Output --  Net 1343 ml    There were no vitals filed for this visit.  Exam: General exam: In no acute distress, asymmetric neck tender to palpation Respiratory system: Good air movement and clear to auscultation. Cardiovascular system: S1 & S2 heard, RRR. No JVD. Gastrointestinal system: Abdomen is nondistended, soft and nontender.  Extremities: No pedal edema. Skin: No rashes, lesions or ulcers Psychiatry: Judgement and insight appear normal. Mood & affect appropriate.   Data Reviewed:    Labs: Basic Metabolic Panel: Recent Labs  Lab 09/03/21 1050 09/05/21 0648 09/06/21 0301  NA 143 136 137  K 3.5 3.1* 3.4*  CL 103 99 101  CO2 28 24 27   GLUCOSE 104* 126* 155*  BUN 23 17 12   CREATININE 0.77 0.83 0.61  CALCIUM 10.4* 9.5 8.8*    GFR Estimated Creatinine Clearance: 49.1 mL/min (by C-G formula based on SCr of 0.61 mg/dL). Liver Function Tests: No results for input(s): AST,  ALT, ALKPHOS, BILITOT, PROT, ALBUMIN in the last 168 hours. No results for input(s): LIPASE, AMYLASE in the last 168 hours. No results for input(s): AMMONIA in the last 168 hours. Coagulation profile No results for input(s): INR, PROTIME in the last 168 hours. COVID-19 Labs  No results for input(s): DDIMER, FERRITIN, LDH, CRP in the last 72 hours.  Lab Results  Component Value Date   SARSCOV2NAA NEGATIVE 09/05/2021   SARSCOV2NAA NEGATIVE 12/05/2020   SARSCOV2NAA NOT DETECTED 04/19/2019    CBC: Recent Labs  Lab 09/03/21 1050 09/05/21 0648 09/06/21 0301  WBC 15.2* 12.0* 8.3  NEUTROABS 11.2* 7.9*  --   HGB 13.5 12.0 10.7*  HCT 41.1 37.6 32.4*  MCV 91.3 93.3 89.8  PLT 361 384 309    Cardiac Enzymes: No results for  input(s): CKTOTAL, CKMB, CKMBINDEX, TROPONINI in the last 168 hours. BNP (last 3 results) No results for input(s): PROBNP in the last 8760 hours. CBG: Recent Labs  Lab 09/06/21 1200 09/06/21 1641 09/06/21 1725 09/06/21 2113 09/07/21 0806  GLUCAP 174* 152* 141* 173* 97    D-Dimer: No results for input(s): DDIMER in the last 72 hours. Hgb A1c: No results for input(s): HGBA1C in the last 72 hours.  Lipid Profile: No results for input(s): CHOL, HDL, LDLCALC, TRIG, CHOLHDL, LDLDIRECT in the last 72 hours. Thyroid function studies: No results for input(s): TSH, T4TOTAL, T3FREE, THYROIDAB in the last 72 hours.  Invalid input(s): FREET3 Anemia work up: No results for input(s): VITAMINB12, FOLATE, FERRITIN, TIBC, IRON, RETICCTPCT in the last 72 hours. Sepsis Labs: Recent Labs  Lab 09/03/21 1050 09/05/21 0648 09/05/21 1210 09/05/21 1749 09/06/21 0301  WBC 15.2* 12.0*  --   --  8.3  LATICACIDVEN  --   --  1.6 1.3  --     Microbiology Recent Results (from the past 240 hour(s))  Blood culture (routine x 2)     Status: None (Preliminary result)   Collection Time: 09/05/21 11:18 AM   Specimen: BLOOD  Result Value Ref Range Status   Specimen Description BLOOD RIGHT ANTECUBITAL  Final   Special Requests   Final    BOTTLES DRAWN AEROBIC AND ANAEROBIC Blood Culture adequate volume   Culture   Final    NO GROWTH 1 DAY Performed at [facility], [address], [place], [place] [address]    Report Status PENDING  Incomplete  Resp Panel by RT-PCR (Flu A&B, Covid) Peripheral     Status: None   Collection Time: 09/05/21 11:18 AM   Specimen: Peripheral; Nasopharyngeal(NP) swabs in vial transport medium  Result Value Ref Range Status   SARS Coronavirus 2 by RT PCR NEGATIVE NEGATIVE Final    Comment: (NOTE) SARS-CoV-2 target nucleic acids are NOT DETECTED.  The SARS-CoV-2 RNA is generally detectable in upper respiratory specimens during the acute phase of infection. The  lowest concentration of SARS-CoV-2 viral copies this assay can detect is 138 copies/mL. A negative result does not preclude SARS-Cov-2 infection and should not be used as the sole basis for treatment or other patient management decisions. A negative result may occur with  improper specimen collection/handling, submission of specimen other than nasopharyngeal swab, presence of viral mutation(s) within the areas targeted by this assay, and inadequate number of viral copies(<138 copies/mL). A negative result must be combined with clinical observations, patient history, and epidemiological information. The expected result is Negative.  Fact Sheet for Patients:  BloggerCourse.com  Fact Sheet for Healthcare Providers:  SeriousBroker.it  This test is  no t yet approved or cleared by the [place] and  has been authorized for detection and/or diagnosis of SARS-CoV-2 by FDA under an Emergency Use Authorization (EUA). This EUA will remain  in effect (meaning this test can be used) for the duration of the COVID-19 declaration under Section 564(b)(1) of the Act, 21 U.S.C.section 360bbb-3(b)(1), unless the authorization is terminated  or revoked sooner.       Influenza A by PCR NEGATIVE NEGATIVE Final   Influenza B by PCR NEGATIVE NEGATIVE Final    Comment: (NOTE) The Xpert Xpress SARS-CoV-2/FLU/RSV plus assay is intended as an aid in the diagnosis of influenza from Nasopharyngeal swab specimens and should not be used as a sole basis for treatment. Nasal washings and aspirates are unacceptable for Xpert Xpress SARS-CoV-2/FLU/RSV testing.  Fact Sheet for Patients: EntrepreneurPulse.com.au  Fact Sheet for Healthcare Providers: IncredibleEmployment.be  This test is not yet approved or cleared by the [place] FDA and has been authorized for detection and/or diagnosis of SARS-CoV-2 by FDA under  an Emergency Use Authorization (EUA). This EUA will remain in effect (meaning this test can be used) for the duration of the COVID-19 declaration under Section 564(b)(1) of the Act, 21 U.S.C. section 360bbb-3(b)(1), unless the authorization is terminated or revoked.  Performed at [facility], [place] [address] [place], [place] [address]   Blood culture (routine x 2)     Status: None (Preliminary result)   Collection Time: 09/05/21 12:29 PM   Specimen: BLOOD  Result Value Ref Range Status   Specimen Description BLOOD SITE NOT SPECIFIED  Final   Special Requests   Final    BOTTLES DRAWN AEROBIC AND ANAEROBIC Blood Culture results may not be optimal due to an excessive volume of blood received in culture bottles   Culture   Final    NO GROWTH 1 DAY Performed at [facility], [place] [address], [place], [place] [address]    Report Status PENDING  Incomplete     Medications:    amLODipine  10 mg Oral Daily   benazepril  10 mg Oral Daily   docusate sodium  100 mg Oral BID   insulin aspart  0-15 Units Subcutaneous TID WC   insulin aspart  0-5 Units Subcutaneous QHS   insulin aspart  4 Units Subcutaneous TID WC   metoprolol succinate  50 mg Oral Daily   mometasone-formoterol  2 puff Inhalation BID   potassium chloride  40 mEq Oral BID   rivaroxaban  20 mg Oral Q supper   sodium chloride flush  3 mL Intravenous Q12H   Continuous Infusions:  ampicillin-sulbactam (UNASYN) IV 3 g (09/07/21 0757)   lactated ringers 75 mL/hr at 09/07/21 0759   vancomycin 1,000 mg (09/07/21 0924)      LOS: 2 days   Charlynne Cousins  [facility]  09/07/2021, 10:02 AM

## 2021-09-07 NOTE — Progress Notes (Signed)
Pt c/o uncontrolled pain at 10 on a scale of 0-10. Prn ordered medications administered ineffective. MD was notified and new orders was received. Pt reported nausea and vomited x1. Prn Zofran given, pt assessed to be wheezing and not in distress. Prn nebulizer given. Pt advise to try to eat lunch as pt had not eaten except a cup of applesauce d/t pain since this am. Pt in bed comfortably and she voices relief with family at bedside. Will continue to close monitor. Dionne Bucy RN

## 2021-09-08 LAB — BASIC METABOLIC PANEL
Anion gap: 7 (ref 5–15)
BUN: 6 mg/dL — ABNORMAL LOW (ref 8–23)
CO2: 28 mmol/L (ref 22–32)
Calcium: 8.5 mg/dL — ABNORMAL LOW (ref 8.9–10.3)
Chloride: 104 mmol/L (ref 98–111)
Creatinine, Ser: 0.71 mg/dL (ref 0.44–1.00)
GFR, Estimated: >60 mL/min (ref >60)
Glucose, Bld: 92 mg/dL (ref 70–99)
Potassium: 3.7 mmol/L (ref 3.5–5.1)
Sodium: 139 mmol/L (ref 135–145)

## 2021-09-08 LAB — GLUCOSE, CAPILLARY
Glucose-Capillary: 121 mg/dL — ABNORMAL HIGH (ref 70–99)
Glucose-Capillary: 77 mg/dL (ref 70–99)
Glucose-Capillary: 112 mg/dL — ABNORMAL HIGH (ref 70–99)
Glucose-Capillary: 100 mg/dL — ABNORMAL HIGH (ref 70–99)

## 2021-09-08 MED ORDER — SODIUM CHLORIDE 0.9 % IV SOLN
3.0000 g | Freq: Four times a day (QID) | INTRAVENOUS | Status: DC
  Filled 2021-09-08: qty 8

## 2021-09-08 MED ORDER — OXYCODONE HCL 5 MG PO TABS
15.0000 mg | ORAL_TABLET | ORAL | Status: DC | PRN
  Administered 2021-09-08 – 2021-09-09 (×6): 15 mg via ORAL
  Filled 2021-09-08 (×6): qty 3

## 2021-09-08 MED ORDER — POLYETHYLENE GLYCOL 3350 17 G PO PACK: 17.0000 g | PACK | Freq: Two times a day (BID) | ORAL | Status: DC

## 2021-09-08 MED ORDER — PIPERACILLIN-TAZOBACTAM 3.375 G IVPB
3.3750 g | Freq: Three times a day (TID) | INTRAVENOUS | Status: DC
  Administered 2021-09-08 – 2021-09-12 (×12): 3.375 g via INTRAVENOUS
  Filled 2021-09-08 (×13): qty 50

## 2021-09-08 NOTE — Progress Notes (Signed)
Patient ID: Kristy [name], female   DOB: Sep 18, 1941, 80 y.o.   MRN: [id number]  Subjective: She is still hurting and requiring significant pain meds.   Objective: Vital signs in last 24 hours: Temp:  [97.8 F (36.6 C)-99 F (37.2 C)] 99 F (37.2 C) (11/21 0735) Pulse Rate:  [72-83] 83 (11/21 0735) Resp:  [11-18] 11 (11/21 0735) BP: (120-151)/(67-87) 146/87 (11/21 0735) SpO2:  [90 %-93 %] 92 % (11/21 0904)  Exam-   She has no change in the swelling. The left submandibular area is slightly more firm.   @LABLAST2 (wbc:2,hgb:2,hct:2,plt:2) Recent Labs    09/07/21 1009 09/08/21 0434  NA 142 139  K 3.2* 3.7  CL 105 104  CO2 29 28  GLUCOSE 97 92  BUN 11 6*  CREATININE 0.63 0.71  CALCIUM 9.5 8.5*    Intake/Output Summary (Last 24 hours) at 09/08/2021 1057 Last data filed at 09/08/2021 0400 Gross per 24 hour  Intake 1616.5 ml  Output --  Net 1616.5 ml     Medications: I have reviewed the patient's current medications.  Assessment/Plan: Submandibular sialadenitis- she is hurting in face of an improved CT scan. She was not hurting as much when the swelling and presentation was worse. It is very confusing. I dont disagree with a feeding tube. Will continue to follow closely. Will see if the change in antibiotics makes difference over the next 24 hours.   LOS: 3 days   09/10/2021 09/08/2021, 10:57 AM

## 2021-09-08 NOTE — Progress Notes (Signed)
[facility] PROGRESS NOTE    Progress Note  Kristy [name]  [id number] DOB: 22-Jan-1941 DOA: 09/05/2021 PCP: Isaac Bliss, Rayford Halsted, MD     Brief Narrative:   Kristy [name] is an 80 y.o. female past medical history significant for diabetes mellitus type 2 asthma was initially seen on 09/01/2021 and was diagnosed with a peritonsillar abscess and treated with Rocephin Augmentin and Decadron, follow-up with her PCP who referred her to the ED CT of the head and neck show early cellulitis and possible salivary duct stone started empirically on vancomycin and continue on Augmentin was unable to follow-up with ENT got up on the day of admission who was unable to tolerate solids came back to the ED repeated CT scan showed progressive infection no airway compromise ENT Dr. Babette Relic was consulted started on Unasyn and Decadron   Assessment/Plan:   Submandibular cellulitis of neck: CT of the head and neck done on 09/05/2021 showed no abscesses. Repeated CT scan of the head and neck 11/07/2020 showed overall slight improvement of left mandibular region and cellulitis left mandibular gland is mildly enlarged. ENT recommended to broaden her antibiotics with Zosyn, continue vancomycin.  Blood cultures have remained negative till date Continue liquid diet, apply cold compresses She has remained afebrile with no leukocytosis. Question if we need to place a core track to bypass the stimulation of the glands and that will help with her nutrition.  Diabetes mellitus type 2: Last A1c of 5.8, she was given dexamethasone and continues to be on Decadron which will make her blood glucose erratic. Continue sliding scale insulin monitor closely.  Essential hypertension: Continue Toprol, ACE inhibitor and Norvasc. Her blood pressure is improved.  Asthma: Continue inhalers appears to be compensated.  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  Goals of care: She prefers to be a  DNR however in the case her airway becomes compromised she is willing to forego a DNR and be intubated temporarily.    DVT prophylaxis: lovenox Family Communication:none Status is: Inpatient  Remains inpatient appropriate because: Acute cellulitis with respiratory compromise initially.        Code Status:     Code Status Orders  (From admission, onward)           Start     Ordered   09/05/21 1407  Limited resuscitation (code)  Continuous       Question Answer Comment  In the event of cardiac or respiratory ARREST: Initiate Code Blue, Call Rapid Response No   In the event of cardiac or respiratory ARREST: Perform CPR No   In the event of cardiac or respiratory ARREST: Perform Intubation/Mechanical Ventilation Yes   In the event of cardiac or respiratory ARREST: Use NIPPV/BiPAp only if indicated No   In the event of cardiac or respiratory ARREST: Administer ACLS medications if indicated No   In the event of cardiac or respiratory ARREST: Perform Defibrillation or Cardioversion if indicated No      09/05/21 1412           Code Status History     Date Active Date Inactive Code Status Order ID Comments User Context   12/09/2020 1610 12/10/2020 1919 Full Code [id number]  Carmon Ginsberg Inpatient   06/24/2018 1743 06/26/2018 1719 Full Code [id number]  Mcarthur Rossetti, MD Inpatient         IV Access:   Peripheral IV   Procedures and diagnostic studies:   CT SOFT TISSUE NECK W  CONTRAST  Result Date: 09/07/2021 CLINICAL DATA:  Left chin cellulitis EXAM: CT NECK WITH CONTRAST TECHNIQUE: Multidetector CT imaging of the neck was performed using the standard protocol following the bolus administration of intravenous contrast. CONTRAST:  139mL OMNIPAQUE IOHEXOL 300 MG/ML  SOLN COMPARISON:  CT neck 09/05/2021 FINDINGS: Pharynx and larynx: The nasal cavity and nasopharynx are normal. The oral cavity and oropharynx are normal. Previously seen mucosal edema in  the hypopharynx and larynx has improved. Previously seen effacement of the left piriform sinus has improved the parapharyngeal spaces are clear. There is no evidence of abscess. The airway is patent. Salivary glands: The left submandibular gland is mildly enlarged compared to the right. No salivary gland stone is seen. The bilateral parotid and right submandibular glands are normal. Thyroid: Unremarkable. Lymph nodes: A prominent left level IB lymph node measuring up to 0.8 cm is not significantly changed in size. Scattered prominent lymph nodes at level II and in the posterior triangle are also not significantly changed. There is no new or progressive lymphadenopathy in the neck. Vascular: There is mild calcified atherosclerotic plaque in the bilateral internal carotid arteries. The vasculature is otherwise unremarkable. The jugular veins are widely patent. Limited intracranial: The imaged intracranial compartment is unremarkable. Visualized orbits: Bilateral lens implants are in place. The globes and orbits are otherwise unremarkable. Mastoids and visualized paranasal sinuses: The paranasal sinuses and mastoid air cells are clear. Skeleton: There is degenerative change in the cervical spine most advanced at C4-C5. There is no acute osseous abnormality or aggressive osseous lesion. There is no visible canal hematoma. Upper chest: The imaged lung apices are clear. Other: Again seen is inflammatory change and scattered free fluid centered in the left submandibular region though also involving the left posterior triangle. Overall, this appears improved compared to the study from 09/05/2021. There is no organized or drainable fluid collection. IMPRESSION: 1. Overall slightly improved inflammatory changes centered in the left submandibular region also involving the posterior triangle likely reflecting improving cellulitis. The left submandibular gland is mildly enlarged compared to the right and may reflect  sialoadenitis. 2. Previously seen edema in the hypopharynx/larynx has improved. The airway is patent. 3. No evidence of abscess. Electronically Signed   By: Valetta Mole M.D.   On: 09/07/2021 16:15     Medical Consultants:   None.   Subjective:    Kristy [name] related the pain is worst with difficulty swallowing.  Objective:    Vitals:   09/08/21 0017 09/08/21 0324 09/08/21 0735 09/08/21 0904  BP: (!) 151/83 130/83 (!) 146/87   Pulse:  73 83   Resp:  11 11   Temp: 98.9 F (37.2 C) 98.7 F (37.1 C) 99 F (37.2 C)   TempSrc: Oral Oral Oral   SpO2:  90% 92% 92%   SpO2: 92 %   Intake/Output Summary (Last 24 hours) at 09/08/2021 [id number] Last data filed at 09/08/2021 0400 Gross per 24 hour  Intake 1616.5 ml  Output --  Net 1616.5 ml    There were no vitals filed for this visit.  Exam: General exam: In no acute distress. Respiratory system: Good air movement and clear to auscultation. Cardiovascular system: S1 & S2 heard, RRR. No JVD. Gastrointestinal system: Abdomen is nondistended, soft and nontender.  Extremities: No pedal edema. Skin: No rashes, lesions or ulcers Psychiatry: Judgement and insight appear normal. Mood & affect appropriate.   Data Reviewed:    Labs: Basic Metabolic Panel: Recent Labs  Lab 09/03/21 1050 09/05/21  3009 09/06/21 0301 09/07/21 1009 09/08/21 0434  NA 143 136 137 142 139  K 3.5 3.1* 3.4* 3.2* 3.7  CL 103 99 101 105 104  CO2 28 24 27 29 28   GLUCOSE 104* 126* 155* 97 92  BUN 23 17 12 11  6*  CREATININE 0.77 0.83 0.61 0.63 0.71  CALCIUM 10.4* 9.5 8.8* 9.5 8.5*    GFR Estimated Creatinine Clearance: 49.1 mL/min (by C-G formula based on SCr of 0.71 mg/dL). Liver Function Tests: No results for input(s): AST, ALT, ALKPHOS, BILITOT, PROT, ALBUMIN in the last 168 hours. No results for input(s): LIPASE, AMYLASE in the last 168 hours. No results for input(s): AMMONIA in the last 168 hours. Coagulation profile No results for  input(s): INR, PROTIME in the last 168 hours. COVID-19 Labs  No results for input(s): DDIMER, FERRITIN, LDH, CRP in the last 72 hours.  Lab Results  Component Value Date   SARSCOV2NAA NEGATIVE 09/05/2021   SARSCOV2NAA NEGATIVE 12/05/2020   SARSCOV2NAA NOT DETECTED 04/19/2019    CBC: Recent Labs  Lab 09/03/21 1050 09/05/21 0648 09/06/21 0301  WBC 15.2* 12.0* 8.3  NEUTROABS 11.2* 7.9*  --   HGB 13.5 12.0 10.7*  HCT 41.1 37.6 32.4*  MCV 91.3 93.3 89.8  PLT 361 384 309    Cardiac Enzymes: No results for input(s): CKTOTAL, CKMB, CKMBINDEX, TROPONINI in the last 168 hours. BNP (last 3 results) No results for input(s): PROBNP in the last 8760 hours. CBG: Recent Labs  Lab 09/07/21 1151 09/07/21 1558 09/07/21 2115 09/07/21 2206 09/08/21 0733  GLUCAP 118* 149* 57* 161* 121*    D-Dimer: No results for input(s): DDIMER in the last 72 hours. Hgb A1c: No results for input(s): HGBA1C in the last 72 hours.  Lipid Profile: No results for input(s): CHOL, HDL, LDLCALC, TRIG, CHOLHDL, LDLDIRECT in the last 72 hours. Thyroid function studies: No results for input(s): TSH, T4TOTAL, T3FREE, THYROIDAB in the last 72 hours.  Invalid input(s): FREET3 Anemia work up: No results for input(s): VITAMINB12, FOLATE, FERRITIN, TIBC, IRON, RETICCTPCT in the last 72 hours. Sepsis Labs: Recent Labs  Lab 09/03/21 1050 09/05/21 0648 09/05/21 1210 09/05/21 1749 09/06/21 0301  WBC 15.2* 12.0*  --   --  8.3  LATICACIDVEN  --   --  1.6 1.3  --     Microbiology Recent Results (from the past 240 hour(s))  Blood culture (routine x 2)     Status: None (Preliminary result)   Collection Time: 09/05/21 11:18 AM   Specimen: BLOOD  Result Value Ref Range Status   Specimen Description BLOOD RIGHT ANTECUBITAL  Final   Special Requests   Final    BOTTLES DRAWN AEROBIC AND ANAEROBIC Blood Culture adequate volume   Culture   Final    NO GROWTH 2 DAYS Performed at [facility], [address] [place], [address] [place]    Report Status PENDING  Incomplete  Resp Panel by RT-PCR (Flu A&B, Covid) Peripheral     Status: None   Collection Time: 09/05/21 11:18 AM   Specimen: Peripheral; Nasopharyngeal(NP) swabs in vial transport medium  Result Value Ref Range Status   SARS Coronavirus 2 by RT PCR NEGATIVE NEGATIVE Final    Comment: (NOTE) SARS-CoV-2 target nucleic acids are NOT DETECTED.  The SARS-CoV-2 RNA is generally detectable in upper respiratory specimens during the acute phase of infection. The lowest concentration of SARS-CoV-2 viral copies this assay can detect is 138 copies/mL. A negative result does not preclude SARS-Cov-2 infection  and should not be used as the sole basis for treatment or other patient management decisions. A negative result may occur with  improper specimen collection/handling, submission of specimen other than nasopharyngeal swab, presence of viral mutation(s) within the areas targeted by this assay, and inadequate number of viral copies(<138 copies/mL). A negative result must be combined with clinical observations, patient history, and epidemiological information. The expected result is Negative.  Fact Sheet for Patients:  EntrepreneurPulse.com.au  Fact Sheet for Healthcare Providers:  IncredibleEmployment.be  This test is no t yet approved or cleared by the [place] FDA and  has been authorized for detection and/or diagnosis of SARS-CoV-2 by FDA under an Emergency Use Authorization (EUA). This EUA will remain  in effect (meaning this test can be used) for the duration of the COVID-19 declaration under Section 564(b)(1) of the Act, 21 U.S.C.section 360bbb-3(b)(1), unless the authorization is terminated  or revoked sooner.       Influenza A by PCR NEGATIVE NEGATIVE Final   Influenza B by PCR NEGATIVE NEGATIVE Final    Comment: (NOTE) The Xpert Xpress SARS-CoV-2/FLU/RSV plus assay is intended as  an aid in the diagnosis of influenza from Nasopharyngeal swab specimens and should not be used as a sole basis for treatment. Nasal washings and aspirates are unacceptable for Xpert Xpress SARS-CoV-2/FLU/RSV testing.  Fact Sheet for Patients: EntrepreneurPulse.com.au  Fact Sheet for Healthcare Providers: IncredibleEmployment.be  This test is not yet approved or cleared by the [place] FDA and has been authorized for detection and/or diagnosis of SARS-CoV-2 by FDA under an Emergency Use Authorization (EUA). This EUA will remain in effect (meaning this test can be used) for the duration of the COVID-19 declaration under Section 564(b)(1) of the Act, 21 U.S.C. section 360bbb-3(b)(1), unless the authorization is terminated or revoked.  Performed at [facility], [place] [address] [place], [place] [address]   Blood culture (routine x 2)     Status: None (Preliminary result)   Collection Time: 09/05/21 12:29 PM   Specimen: BLOOD  Result Value Ref Range Status   Specimen Description BLOOD SITE NOT SPECIFIED  Final   Special Requests   Final    BOTTLES DRAWN AEROBIC AND ANAEROBIC Blood Culture results may not be optimal due to an excessive volume of blood received in culture bottles   Culture   Final    NO GROWTH 2 DAYS Performed at [facility], [place] [address], [place], [place] [address]    Report Status PENDING  Incomplete     Medications:    amLODipine  10 mg Oral Daily   benazepril  10 mg Oral Daily   docusate sodium  100 mg Oral BID   hydrochlorothiazide  12.5 mg Oral Daily   insulin aspart  0-15 Units Subcutaneous TID WC   insulin aspart  0-5 Units Subcutaneous QHS   insulin aspart  4 Units Subcutaneous TID WC   metoprolol succinate  50 mg Oral Daily   mometasone-formoterol  2 puff Inhalation BID   pantoprazole  40 mg Oral Daily   rivaroxaban  20 mg Oral Q supper   sodium chloride flush  3 mL Intravenous Q12H    Continuous Infusions:  ampicillin-sulbactam (UNASYN) IV 3 g (09/08/21 0825)   vancomycin Stopped (09/07/21 1024)      LOS: 3 days   Charlynne Cousins  [facility]  09/08/2021, 9:52 AM

## 2021-09-08 NOTE — Progress Notes (Signed)
Pharmacy Antibiotic Note  Kristy [name] is a 80 y.o. female recently diagnosed with peritonsillar abscess on 11/14 and treated with Rocephin and then Augmentin.  Patient now admitted with neck cellulitis on vancomycin. Also on Unasyn for aspiration. Now broadening to Zosyn for aspiration pneumonia. WBC wnl, SCr wnl. AF  Plan: Continue vancomycin 1gm IV Q24H for AUC 505 using SCr 0.8 Stop Unasyn and start Zosyn 3.375 gm IV Q 8 hours (EI infusion)  Monitor renal fxn, clinical progress, vanc levels as indicated Will check vancomycin levels tomorrow.      Temp (24hrs), Avg:98.8 F (37.1 C), Min:98.4 F (36.9 C), Max:99.1 F (37.3 C)  Recent Labs  Lab 09/03/21 1050 09/05/21 0648 09/05/21 1210 09/05/21 1749 09/06/21 0301 09/07/21 1009 09/08/21 0434  WBC 15.2* 12.0*  --   --  8.3  --   --   CREATININE 0.77 0.83  --   --  0.61 0.63 0.71  LATICACIDVEN  --   --  1.6 1.3  --   --   --      Estimated Creatinine Clearance: 49.1 mL/min (by C-G formula based on SCr of 0.71 mg/dL).    Allergies  Allergen Reactions   Clonidine Hives   Vanc 11/19 >> Unasyn 11/18 >> 11/21 Zosyn 11/21 >>   11/18 BCx -   Vinnie Level, PharmD., BCPS, BCCCP Clinical Pharmacist Please refer to [facility] for unit-specific pharmacist

## 2021-09-08 NOTE — Progress Notes (Signed)
Inpatient Diabetes Program Recommendations  AACE/ADA: New Consensus Statement on Inpatient Glycemic Control (2015)  Target Ranges:  Prepandial:   less than 140 mg/dL      Peak postprandial:   less than 180 mg/dL (1-2 hours)      Critically ill patients:  140 - 180 mg/dL   Lab Results  Component Value Date   GLUCAP 121 (H) 09/08/2021   HGBA1C 5.8 (A) 09/03/2021    Review of Glycemic Control  Diabetes history: DM 2 Outpatient Diabetes medications: Metformin 500 mg qhs Current orders for Inpatient glycemic control:  Novolog 0-15 units tid + hs Novolog 4 units tid meal coverage  Inpatient Diabetes Program Recommendations:    Glucose 57 last night after 2 units novolog correction and 4 units of novolog meal coverage  - Consider decreasing Novolog to Sensitive Correction and may need to d/c meal coverage as pt didn't require novolog until last night at supper time.  Sent message to Dr. Robb Matar.   Thanks,  Christena Deem RN, MSN, BC-ADM Inpatient Diabetes Coordinator Team Pager [phone] (8a-5p)

## 2021-09-08 NOTE — Evaluation (Addendum)
Physical Therapy Evaluation Patient Details Name: MARIEKE LUBKE MRN: [id number] DOB: Sep 27, 1941 Today's Date: 09/08/2021  History of Present Illness  80 y/o female presented to ED on 11/18 for neck swelling worsening, trouble swallowing, and SOB. Recently seen in ED on 11/14 & 11/16 for similar symptoms with CT of neck showing submandibular cellulitis vs possible salivary duct stone and was prescribed antibiotics and sent home. PT signed off 11/19, reconsulted 11/21 for worsening mobility. PMH: DM, asthma, HTN  Clinical Impression   Pt presents with generalized weakness, impaired balance, impaired activity tolerance, and mild dyspnea on exertion. Pt to benefit from acute PT to address deficits. Pt ambulated hallway distance, requiring use of IV pole to self-steady. Pt moving slowly and unsteadily at this time, will continue to follow while acute to progress mobility back to mod I baseline.   Pt maintained SpO2 88% and greater on RA, was on 2LO2 upon PT arrival to room so at end of session pt placed back on 1LO2. RN notified.      Recommendations for follow up therapy are one component of a multi-disciplinary discharge planning process, led by the attending physician.  Recommendations may be updated based on patient status, additional functional criteria and insurance authorization.  Follow Up Recommendations No PT follow up    Assistance Recommended at Discharge PRN  Functional Status Assessment Patient has had a recent decline in their functional status and demonstrates the ability to make significant improvements in function in a reasonable and predictable amount of time.  Equipment Recommendations  None recommended by PT    Recommendations for Other Services       Precautions / Restrictions Precautions Precautions: Fall Precaution Comments: watch sats Restrictions Weight Bearing Restrictions: No      Mobility  Bed Mobility Overal bed mobility: Needs Assistance Bed Mobility:  Supine to Sit     Supine to sit: Min guard;HOB elevated     General bed mobility comments: increased time and effort to scoot to EOB, use of bedrails to perform.    Transfers Overall transfer level: Needs assistance Equipment used: None Transfers: Sit to/from Stand Sit to Stand: Min guard           General transfer comment: for safety, slow to rise and reaching for environment once standing.    Ambulation/Gait Ambulation/Gait assistance: Min guard Gait Distance (Feet): 120 Feet Assistive device: IV Pole Gait Pattern/deviations: Step-through pattern;Decreased stride length;Trunk flexed Gait velocity: decr     General Gait Details: cues for upright posture, pt reliant on IV pole to steady self today. x1 standing rest break x30 seconds to recover fatigue. SpO2 88% and greater on RA.  Stairs            Wheelchair Mobility    Modified Rankin (Stroke Patients Only)       Balance Overall balance assessment: Needs assistance Sitting-balance support: Feet supported;No upper extremity supported Sitting balance-Leahy Scale: Good     Standing balance support: Single extremity supported;During functional activity;Reliant on assistive device for balance Standing balance-Leahy Scale: Poor                               Pertinent Vitals/Pain Pain Assessment: Faces Faces Pain Scale: Hurts even more Pain Location: neck Pain Descriptors / Indicators: Grimacing;Guarding Pain Intervention(s): Limited activity within patient's tolerance;Patient requesting pain meds-RN notified;Monitored during session    Home Living Family/patient expects to be discharged to:: Private residence Living Arrangements: Alone Available  Help at Discharge: Family Type of Home: House Home Access: Stairs to enter Entrance Stairs-Rails: Can reach both Entrance Stairs-Number of Steps: 3 Alternate Level Stairs-Number of Steps: 16 Home Layout: Two level;Able to live on main level with  bedroom/bathroom;Bed/bath upstairs Home Equipment: Agricultural consultant (2 wheels);Cane - single point      Prior Function Prior Level of Function : Independent/Modified Independent;Driving             Mobility Comments: dances and bowls       Hand Dominance   Dominant Hand: Right    Extremity/Trunk Assessment   Upper Extremity Assessment Upper Extremity Assessment: Overall WFL for tasks assessed    Lower Extremity Assessment Lower Extremity Assessment: Generalized weakness    Cervical / Trunk Assessment Cervical / Trunk Assessment: Normal  Communication   Communication: No difficulties  Cognition Arousal/Alertness: Awake/alert Behavior During Therapy: WFL for tasks assessed/performed Overall Cognitive Status: Within Functional Limits for tasks assessed                                 General Comments: increased response time and keeps responses short for the most part, suspect due to throat and neck pain        General Comments      Exercises     Assessment/Plan    PT Assessment Patient needs continued PT services  PT Problem List Decreased strength;Decreased mobility;Decreased activity tolerance;Decreased balance;Decreased knowledge of use of DME;Pain       PT Treatment Interventions DME instruction;Therapeutic activities;Gait training;Therapeutic exercise;Balance training;Stair training;Patient/family education;Functional mobility training    PT Goals (Current goals can be found in the Care Plan section)  Acute Rehab PT Goals Patient Stated Goal: to go home PT Goal Formulation: With patient Time For Goal Achievement: 09/22/21 Potential to Achieve Goals: Good    Frequency Min 3X/week   Barriers to discharge        Co-evaluation               AM-PAC PT "6 Clicks" Mobility  Outcome Measure Help needed turning from your back to your side while in a flat bed without using bedrails?: A Little Help needed moving from lying on your  back to sitting on the side of a flat bed without using bedrails?: A Little Help needed moving to and from a bed to a chair (including a wheelchair)?: A Little Help needed standing up from a chair using your arms (e.g., wheelchair or bedside chair)?: A Little Help needed to walk in hospital room?: A Little Help needed climbing 3-5 steps with a railing? : A Little 6 Click Score: 18    End of Session   Activity Tolerance: Patient tolerated treatment well;Patient limited by fatigue Patient left: in chair;with call bell/phone within reach;with chair alarm set Nurse Communication: Mobility status PT Visit Diagnosis: Unsteadiness on feet (R26.81);Muscle weakness (generalized) (M62.81)    Time: 9798-9211 PT Time Calculation (min) (ACUTE ONLY): 24 min   Charges:   PT Evaluation $PT Eval Low Complexity: 1 Low PT Treatments $Gait Training: 8-22 mins       Marye Round, PT DPT Acute Rehabilitation Services Pager [phone]  Office [phone]   Tyrone Apple E Christain [place] 09/08/2021, 4:44 PM

## 2021-09-08 NOTE — Care Management Important Message (Signed)
Important Message  Patient Details  Name: Kristy [name] MRN: [id number] Date of Birth: 1940/11/29   Medicare Important Message Given:  Yes     Wadie Lessen 09/08/2021, 12:46 PM

## 2021-09-09 ENCOUNTER — Telehealth: Payer: Medicare Other

## 2021-09-09 DIAGNOSIS — L03221 Cellulitis of neck: Secondary | ICD-10-CM | POA: Diagnosis not present

## 2021-09-09 LAB — VANCOMYCIN, TROUGH: Vancomycin Tr: 8 ug/mL — ABNORMAL LOW (ref 15–20)

## 2021-09-09 LAB — GLUCOSE, CAPILLARY
Glucose-Capillary: 122 mg/dL — ABNORMAL HIGH (ref 70–99)
Glucose-Capillary: 128 mg/dL — ABNORMAL HIGH (ref 70–99)
Glucose-Capillary: 109 mg/dL — ABNORMAL HIGH (ref 70–99)
Glucose-Capillary: 121 mg/dL — ABNORMAL HIGH (ref 70–99)

## 2021-09-09 LAB — VANCOMYCIN, PEAK: Vancomycin Pk: 35 ug/mL (ref 30–40)

## 2021-09-09 MED ORDER — DEXAMETHASONE SODIUM PHOSPHATE 10 MG/ML IJ SOLN: 10.0000 mg | Freq: Once | INTRAMUSCULAR | Status: DC

## 2021-09-09 MED ORDER — DEXAMETHASONE SODIUM PHOSPHATE 10 MG/ML IJ SOLN
8.0000 mg | Freq: Three times a day (TID) | INTRAMUSCULAR | Status: AC
  Administered 2021-09-10 (×2): 8 mg via INTRAVENOUS
  Filled 2021-09-09 (×2): qty 0.8

## 2021-09-09 MED ORDER — IPRATROPIUM-ALBUTEROL 0.5-2.5 (3) MG/3ML IN SOLN
3.0000 mL | Freq: Four times a day (QID) | RESPIRATORY_TRACT | Status: DC
  Administered 2021-09-09 – 2021-09-10 (×3): 3 mL via RESPIRATORY_TRACT
  Filled 2021-09-09 (×3): qty 3

## 2021-09-09 MED ORDER — DEXAMETHASONE SODIUM PHOSPHATE 10 MG/ML IJ SOLN
10.0000 mg | Freq: Once | INTRAMUSCULAR | Status: DC
  Filled 2021-09-09: qty 1

## 2021-09-09 MED ORDER — DEXAMETHASONE SODIUM PHOSPHATE 4 MG/ML IJ SOLN
4.0000 mg | Freq: Three times a day (TID) | INTRAMUSCULAR | Status: AC
  Administered 2021-09-10 – 2021-09-11 (×2): 4 mg via INTRAVENOUS
  Filled 2021-09-09 (×2): qty 1

## 2021-09-09 MED ORDER — DEXAMETHASONE SODIUM PHOSPHATE 4 MG/ML IJ SOLN
10.0000 mg | Freq: Once | INTRAMUSCULAR | Status: AC
  Administered 2021-09-09: 10 mg via INTRAVENOUS
  Filled 2021-09-09: qty 3

## 2021-09-09 MED ORDER — BUDESONIDE 0.25 MG/2ML IN SUSP
0.2500 mg | Freq: Two times a day (BID) | RESPIRATORY_TRACT | Status: DC
  Administered 2021-09-09 – 2021-09-10 (×3): 0.25 mg via RESPIRATORY_TRACT
  Filled 2021-09-09 (×4): qty 2

## 2021-09-09 MED ORDER — LACTATED RINGERS IV BOLUS
500.0000 mL | Freq: Once | INTRAVENOUS | Status: AC
  Administered 2021-09-09: 500 mL via INTRAVENOUS

## 2021-09-09 MED ORDER — POLYETHYLENE GLYCOL 3350 17 G PO PACK
17.0000 g | PACK | Freq: Two times a day (BID) | ORAL | Status: AC
  Filled 2021-09-09: qty 1

## 2021-09-09 NOTE — Consult Note (Signed)
NAME:  Kristy [name], MRN:  [id number], DOB:  1941-01-04, LOS: 4 ADMISSION DATE:  09/05/2021, CONSULTATION DATE:  09/09/2021 REFERRING MD:  Dr David Stall, CHIEF COMPLAINT:  Submandibular cellulitis    History of Present Illness:  Kristy [name] is a 80 y.o. female with a PMH significant for HTN, type 2 diabetes, asthma, and GERD who presented 11/18 for complaints of neck swelling. Patient was seen at this facility on 11/14 and diagnosis with peritonsillitis abscess and treated with Rocephin x1 and Augmentin x10 days.  On PCP eval 11/16 she was seen with SOB, stridor, and raspy voice and referred back to to the ER where she received dose of vanc  and was told to continue Augmentin and to follow up with EN.   On ED arrival 11/18 she was seen with worse pain and SOB, CT reveled progression of infection and patient was admitted per [facility].   Afternoon of 11/22 patient expressed worsening sore throat with difficulty swallowing. Given extensive history with concern for ability to protect airway PCCM consulted.   Pertinent  Medical History  HTN, type 2 diabetes, asthma, and GERD  Significant Hospital Events: Including procedures, antibiotic start and stop dates in addition to other pertinent events   11/18 admitted for submandibular cellulitis with stridor 11/22 PCCM consulted   Imagine   11/20 Overall slightly improved inflammatory changes centered in the left submandibular region also involving the posterior triangle likely reflecting improving cellulitis. The left submandibular gland is mildly enlarged compared to the right and may reflect sialoadenitis. Previously seen edema in the hypopharynx/larynx has improved. The airway is patent. No evidence of abscess.  Antibiotics  11/20 Vancomycin > 11/21 Zosyn >    Interim History / Subjective:  Seen lying in bed in moderate discomfort at neck cellulitis site   Objective   Blood pressure (!) 144/79, pulse 85, temperature 99.3 F (37.4 C),  temperature source Oral, resp. rate 11, SpO2 91 %.        Intake/Output Summary (Last 24 hours) at 09/09/2021 1814 Last data filed at 09/09/2021 1500 Gross per 24 hour  Intake 1220 ml  Output --  Net 1220 ml   There were no vitals filed for this visit.  Examination: General: Acute on chronically ill appearing elderly female lying in bed, in NAD HEENT: ETTNC/AT MM pink/moist, PERRL, Neck swelling from left mandible tracking around neck to mid trachea  Neuro: Alert and orineted x3 non-focal  CV: s1s2 regular rate and rhythm, no murmur, rubs, or gallops,  PULM:  Clear to ascultation, no increased work of breathing, no stridor  GI: soft, bowel sounds active in all 4 quadrants, non-tender, non-distended, Extremities: warm/dry, no edema  Skin: no rashes or lesions  Resolved Hospital Problem list     Assessment & Plan:  Submandibular cellulitis of neck -CT 11/20 remains stable, no critical airway compromise seen on bedside assessment 11/22 P: -ENT consulted -starting steroids -warm compress -consider repeat imaging -continue vanc/zosyn -continue npo -trend WBC/fever -core track in place  -follow cultures -will give IV fluids; patient appears dehydrated   Asthma P: -will stop dulera inhaler due to swelling in upper airway and switch to nebs -pulmicort and duoneb scheduled   Patient does not need ICU admission at this time. PCCM available as needed if clinical status worsens.  Best Practice (right click and "Reselect all SmartList Selections" daily)   Diet/type: Regular consistency (see orders) DVT prophylaxis: other -Xarelto  GI prophylaxis: N/A Lines: N/A Foley:  N/A Code Status:  full code Last date of multidisciplinary goals of care discussion: Per primary   Labs   CBC: Recent Labs  Lab 09/03/21 1050 09/05/21 0648 09/06/21 0301  WBC 15.2* 12.0* 8.3  NEUTROABS 11.2* 7.9*  --   HGB 13.5 12.0 10.7*  HCT 41.1 37.6 32.4*  MCV 91.3 93.3 89.8  PLT 361 384 309     Basic Metabolic Panel: Recent Labs  Lab 09/03/21 1050 09/05/21 0648 09/06/21 0301 09/07/21 1009 09/08/21 0434  NA 143 136 137 142 139  K 3.5 3.1* 3.4* 3.2* 3.7  CL 103 99 101 105 104  CO2 28 24 27 29 28   GLUCOSE 104* 126* 155* 97 92  BUN 23 17 12 11  6*  CREATININE 0.77 0.83 0.61 0.63 0.71  CALCIUM 10.4* 9.5 8.8* 9.5 8.5*   GFR: Estimated Creatinine Clearance: 49.1 mL/min (by C-G formula based on SCr of 0.71 mg/dL). Recent Labs  Lab 09/03/21 1050 09/05/21 0648 09/05/21 1210 09/05/21 1749 09/06/21 0301  WBC 15.2* 12.0*  --   --  8.3  LATICACIDVEN  --   --  1.6 1.3  --     Liver Function Tests: No results for input(s): AST, ALT, ALKPHOS, BILITOT, PROT, ALBUMIN in the last 168 hours. No results for input(s): LIPASE, AMYLASE in the last 168 hours. No results for input(s): AMMONIA in the last 168 hours.  ABG No results found for: PHART, PCO2ART, PO2ART, HCO3, TCO2, ACIDBASEDEF, O2SAT   Coagulation Profile: No results for input(s): INR, PROTIME in the last 168 hours.  Cardiac Enzymes: No results for input(s): CKTOTAL, CKMB, CKMBINDEX, TROPONINI in the last 168 hours.  HbA1C: Hemoglobin A1C  Date/Time Value Ref Range Status  09/03/2021 09:41 AM 5.8 (A) 4.0 - 5.6 % Final  06/03/2021 11:44 AM 5.8 (A) 4.0 - 5.6 % Final   Hgb A1c MFr Bld  Date/Time Value Ref Range Status  11/27/2020 11:13 AM 5.7 (H) 4.8 - 5.6 % Final    Comment:    (NOTE) Pre diabetes:          5.7%-6.4%  Diabetes:              >6.4%  Glycemic control for   <7.0% adults with diabetes   03/06/2020 10:33 AM 6.6 (H) 4.6 - 6.5 % Final    Comment:    Glycemic Control Guidelines for People with Diabetes:Non Diabetic:  <6%Goal of Therapy: <7%Additional Action Suggested:  >8%     CBG: Recent Labs  Lab 09/08/21 1550 09/08/21 2100 09/09/21 0742 09/09/21 1127 09/09/21 1528  GLUCAP 112* 100* 122* 128* 109*    Review of Systems:   Please see the history of present illness. All other systems  reviewed and are negative   Past Medical History:  She,  has a past medical history of Allergic rhinitis, seasonal, Arthritis, Asthma, Diabetes mellitus without complication (HCC), Dysrhythmia, GERD (gastroesophageal reflux disease), History of colon polyps, Hypertension, Pneumonia, and Rotator cuff tear, right.   Surgical History:   Past Surgical History:  Procedure Laterality Date   BACK SURGERY     x3  ruptured disc    CHOLECYSTECTOMY OPEN  AGE 85   EYE SURGERY     bil cataracts   HERNIA REPAIR     INGUINAL HERNIA REPAIR Right AGE 85s   KNEE ARTHROSCOPY Left 2014   LUMBAR DISC SURGERY  x2  LAST DATE EARLY 2000s   ORIF ANKLE FRACTURE Right 06/24/2018   Procedure: OPEN REDUCTION INTERNAL FIXATION (ORIF) RIGHT ANKLE FRACTURE;  Surgeon: 09/11/21,  Vanita Panda, MD;  Location: [facility];  Service: Orthopedics;  Laterality: Right;   ROTATOR CUFF REPAIR Left 01/2015   TONSILLECTOMY  AGE 82   TOTAL KNEE ARTHROPLASTY Right 12/09/2020   Procedure: TOTAL KNEE ARTHROPLASTY;  Surgeon: Ollen Gross, MD;  Location: [facility];  Service: Orthopedics;  Laterality: Right;    VAGINAL HYSTERECTOMY  AGE 50   WITH BSO     Social History:   reports that she quit smoking about 32 years ago. Her smoking use included cigarettes. She has never used smokeless tobacco. She reports current alcohol use of about 3.0 - 4.0 standard drinks per week. She reports that she does not use drugs.   Family History:  Her family history is negative for Colon cancer, Esophageal cancer, Rectal cancer, and Stomach cancer.   Allergies Allergies  Allergen Reactions   Clonidine Hives     Home Medications  Prior to Admission medications   Medication Sig Start Date End Date Taking? Authorizing Provider  albuterol (VENTOLIN HFA) 108 (90 Base) MCG/ACT inhaler INHALE 2 PUFFS BY MOUTH EVERY 6 HOURS AS NEEDED FOR WHEEZE Patient taking differently: Inhale 2 puffs into the lungs every 6 (six) hours as needed for shortness of  breath or wheezing. 03/25/20  Yes Philip Aspen, Limmie Patricia, MD  amLODipine (NORVASC) 10 MG tablet TAKE 1 TABLET BY MOUTH EVERY DAY 04/15/21  Yes Philip Aspen, Limmie Patricia, MD  amoxicillin-clavulanate (AUGMENTIN) 875-125 MG tablet Take 1 tablet by mouth every 12 (twelve) hours for 10 days. 09/01/21 09/11/21 Yes Theadora Rama Scales, PA-C  benazepril (LOTENSIN) 40 MG tablet TAKE 1 TABLET BY MOUTH EVERY DAY 04/15/21  Yes Philip Aspen, Limmie Patricia, MD  celecoxib (CELEBREX) 200 MG capsule TAKE 1 CAPSULE BY MOUTH EVERY DAY 07/03/21  Yes Philip Aspen, Limmie Patricia, MD  fluticasone (FLONASE) 50 MCG/ACT nasal spray SPRAY 2 SPRAYS INTO EACH NOSTRIL EVERY DAY Patient taking differently: Place 2 sprays into both nostrils daily as needed for allergies. 06/09/19  Yes Philip Aspen, Limmie Patricia, MD  hydrochlorothiazide (HYDRODIURIL) 12.5 MG tablet TAKE 1 TABLET (12.5 MG TOTAL) BY MOUTH AS NEEDED. FOR SWELLING Patient taking differently: Take 12.5 mg by mouth daily. 03/21/21  Yes Lennette Bihari, MD  metFORMIN (GLUCOPHAGE) 500 MG tablet Take 1 tablet (500 mg total) by mouth at bedtime. 02/28/21  Yes Philip Aspen, Limmie Patricia, MD  metoprolol succinate (TOPROL-XL) 50 MG 24 hr tablet TAKE 1 TABLET BY MOUTH EVERY DAY 02/20/21  Yes Lennette Bihari, MD  rivaroxaban (XARELTO) 20 MG TABS tablet Take 1 tablet (20 mg total) by mouth daily with supper. 08/06/21  Yes Philip Aspen, Limmie Patricia, MD  traMADol (ULTRAM) 50 MG tablet TAKE 1 TABLET (50 MG TOTAL) BY MOUTH EVERY 12 (TWELVE) HOURS AS NEEDED. Patient taking differently: Take 50 mg by mouth every 12 (twelve) hours as needed (PAIN.). 08/02/20  Yes Philip Aspen, Limmie Patricia, MD  WIXELA INHUB 250-50 MCG/DOSE AEPB INHALE 1 PUFF BY MOUTH EVERY 12 HOURS Patient taking differently: Inhale 1 puff into the lungs 2 (two) times daily as needed (congestion/difficulty breathing.). 04/04/19  Yes Philip Aspen, Limmie Patricia, MD  HYDROcodone-acetaminophen (NORCO) 5-325 MG tablet Take 1 tablet  by mouth every 6 (six) hours as needed for moderate pain. Patient not taking: Reported on 09/05/2021 06/03/21   Philip Aspen, Limmie Patricia, MD  oxyCODONE (OXY IR/ROXICODONE) 5 MG immediate release tablet Take 1-2 tablets (5-10 mg total) by mouth every 6 (six) hours as needed for severe pain. Patient not taking: Reported  on 09/05/2021 12/10/20   Alyssa Grove, PA-C     Critical care time: NA   Billie Intriago D. Tiburcio Pea, NP-C [facility] Personal contact information can be found on Amion  09/09/2021, 6:45 PM

## 2021-09-09 NOTE — Progress Notes (Addendum)
[facility] PROGRESS NOTE    Progress Note  Kristy [name]  [id number] DOB: [birth year] DOA: 09/05/2021 PCP: Isaac Bliss, Rayford Halsted, MD     Brief Narrative:   Kristy [name] is an 80 y.o. female past medical history significant for diabetes mellitus type 2 asthma was initially seen on 09/01/2021 and was diagnosed with a peritonsillar abscess and treated with Rocephin Augmentin and Decadron, follow-up with her PCP who referred her to the ED CT of the head and neck show early cellulitis and possible salivary duct stone started empirically on vancomycin and continue on Augmentin was unable to follow-up with ENT got up on the day of admission who was unable to tolerate solids came back to the ED repeated CT scan showed progressive infection no airway compromise ENT Dr. Babette Relic was consulted started on Unasyn and Decadron   Assessment/Plan:   Submandibular cellulitis of neck: CT of the head and neck done on 09/05/2021 showed no abscesses. Repeated CT scan of the head and neck 11/07/2020 showed overall slight improvement of left mandibular region and cellulitis left mandibular gland is mildly enlarged. Placed NPO, apply cold compresses She has remained afebrile with no leukocytosis. Insert core track to bypass the stimulation of the glands, she is also has not eaten in over 5 days properly discussed with ENT.   Appreciate ENTs assistance cultures have remained negative. Continue IV vancomycin and Zosyn. This is a confusing picture, she is not septic appearing, this morning as her pain is controlled she looks more upbeat and in a good mood continue lidocaine viscus and narcotics  Diabetes mellitus type 2: Last A1c of 5.8, she was given dexamethasone and continues to be on Decadron which will make her blood glucose erratic. Continue sliding scale insulin monitor closely.  Essential hypertension: Continue Toprol, ACE inhibitor and Norvasc. Her blood pressure is  improved.  Asthma: Continue inhalers appears to be compensated.  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  Goals of care: She prefers to be a DNR however in the case her airway becomes compromised she is willing to forego a DNR and be intubated temporarily.    DVT prophylaxis: lovenox Family Communication:none Status is: Inpatient  Remains inpatient appropriate because: Acute cellulitis with respiratory compromise initially.        Code Status:     Code Status Orders  (From admission, onward)           Start     Ordered   09/05/21 1407  Limited resuscitation (code)  Continuous       Question Answer Comment  In the event of cardiac or respiratory ARREST: Initiate Code Blue, Call Rapid Response No   In the event of cardiac or respiratory ARREST: Perform CPR No   In the event of cardiac or respiratory ARREST: Perform Intubation/Mechanical Ventilation Yes   In the event of cardiac or respiratory ARREST: Use NIPPV/BiPAp only if indicated No   In the event of cardiac or respiratory ARREST: Administer ACLS medications if indicated No   In the event of cardiac or respiratory ARREST: Perform Defibrillation or Cardioversion if indicated No      09/05/21 1412           Code Status History     Date Active Date Inactive Code Status Order ID Comments User Context   12/09/2020 1610 12/10/2020 1919 Full Code [id number]  Carmon Ginsberg Inpatient   06/24/2018 1743 06/26/2018 1719 Full Code [id number]  Mcarthur Rossetti, MD Inpatient  IV Access:   Peripheral IV   Procedures and diagnostic studies:   CT SOFT TISSUE NECK W CONTRAST  Result Date: 09/07/2021 CLINICAL DATA:  Left chin cellulitis EXAM: CT NECK WITH CONTRAST TECHNIQUE: Multidetector CT imaging of the neck was performed using the standard protocol following the bolus administration of intravenous contrast. CONTRAST:  161mL OMNIPAQUE IOHEXOL 300 MG/ML  SOLN COMPARISON:   CT neck 09/05/2021 FINDINGS: Pharynx and larynx: The nasal cavity and nasopharynx are normal. The oral cavity and oropharynx are normal. Previously seen mucosal edema in the hypopharynx and larynx has improved. Previously seen effacement of the left piriform sinus has improved the parapharyngeal spaces are clear. There is no evidence of abscess. The airway is patent. Salivary glands: The left submandibular gland is mildly enlarged compared to the right. No salivary gland stone is seen. The bilateral parotid and right submandibular glands are normal. Thyroid: Unremarkable. Lymph nodes: A prominent left level IB lymph node measuring up to 0.8 cm is not significantly changed in size. Scattered prominent lymph nodes at level II and in the posterior triangle are also not significantly changed. There is no new or progressive lymphadenopathy in the neck. Vascular: There is mild calcified atherosclerotic plaque in the bilateral internal carotid arteries. The vasculature is otherwise unremarkable. The jugular veins are widely patent. Limited intracranial: The imaged intracranial compartment is unremarkable. Visualized orbits: Bilateral lens implants are in place. The globes and orbits are otherwise unremarkable. Mastoids and visualized paranasal sinuses: The paranasal sinuses and mastoid air cells are clear. Skeleton: There is degenerative change in the cervical spine most advanced at C4-C5. There is no acute osseous abnormality or aggressive osseous lesion. There is no visible canal hematoma. Upper chest: The imaged lung apices are clear. Other: Again seen is inflammatory change and scattered free fluid centered in the left submandibular region though also involving the left posterior triangle. Overall, this appears improved compared to the study from 09/05/2021. There is no organized or drainable fluid collection. IMPRESSION: 1. Overall slightly improved inflammatory changes centered in the left submandibular region also  involving the posterior triangle likely reflecting improving cellulitis. The left submandibular gland is mildly enlarged compared to the right and may reflect sialoadenitis. 2. Previously seen edema in the hypopharynx/larynx has improved. The airway is patent. 3. No evidence of abscess. Electronically Signed   By: Valetta Mole M.D.   On: 09/07/2021 16:15     Medical Consultants:   None.   Subjective:    Kristy [name] still having difficulty swallowing but her pain is controlled.  Objective:    Vitals:   09/08/21 2334 09/09/21 0334 09/09/21 0744 09/09/21 0810  BP: (!) 147/88 134/89 (!) 156/75   Pulse: 77 80 87   Resp: 16 13 13    Temp: 98.9 F (37.2 C) 98.7 F (37.1 C) 99 F (37.2 C)   TempSrc: Oral Oral Oral   SpO2: 96% 94% 90% 96%   SpO2: 96 % O2 Flow Rate (L/min): 1 L/min   Intake/Output Summary (Last 24 hours) at 09/09/2021 0853 Last data filed at 09/09/2021 [id number] Gross per 24 hour  Intake 420 ml  Output --  Net 420 ml    There were no vitals filed for this visit.  Exam: General exam: In no acute distress. Respiratory system: Good air movement and clear to auscultation. Cardiovascular system: S1 & S2 heard, RRR. No JVD. Gastrointestinal system: Abdomen is nondistended, soft and nontender.  Extremities: No pedal edema. Skin: No rashes, lesions or ulcers Psychiatry: Judgement  and insight appear normal. Mood & affect appropriate.   Data Reviewed:    Labs: Basic Metabolic Panel: Recent Labs  Lab 09/03/21 1050 09/05/21 0648 09/06/21 0301 09/07/21 1009 09/08/21 0434  NA 143 136 137 142 139  K 3.5 3.1* 3.4* 3.2* 3.7  CL 103 99 101 105 104  CO2 28 24 27 29 28   GLUCOSE 104* 126* 155* 97 92  BUN 23 17 12 11  6*  CREATININE 0.77 0.83 0.61 0.63 0.71  CALCIUM 10.4* 9.5 8.8* 9.5 8.5*    GFR Estimated Creatinine Clearance: 49.1 mL/min (by C-G formula based on SCr of 0.71 mg/dL). Liver Function Tests: No results for input(s): AST, ALT, ALKPHOS, BILITOT,  PROT, ALBUMIN in the last 168 hours. No results for input(s): LIPASE, AMYLASE in the last 168 hours. No results for input(s): AMMONIA in the last 168 hours. Coagulation profile No results for input(s): INR, PROTIME in the last 168 hours. COVID-19 Labs  No results for input(s): DDIMER, FERRITIN, LDH, CRP in the last 72 hours.  Lab Results  Component Value Date   SARSCOV2NAA NEGATIVE 09/05/2021   SARSCOV2NAA NEGATIVE 12/05/2020   SARSCOV2NAA NOT DETECTED 04/19/2019    CBC: Recent Labs  Lab 09/03/21 1050 09/05/21 0648 09/06/21 0301  WBC 15.2* 12.0* 8.3  NEUTROABS 11.2* 7.9*  --   HGB 13.5 12.0 10.7*  HCT 41.1 37.6 32.4*  MCV 91.3 93.3 89.8  PLT 361 384 309    Cardiac Enzymes: No results for input(s): CKTOTAL, CKMB, CKMBINDEX, TROPONINI in the last 168 hours. BNP (last 3 results) No results for input(s): PROBNP in the last 8760 hours. CBG: Recent Labs  Lab 09/08/21 0733 09/08/21 1125 09/08/21 1550 09/08/21 2100 09/09/21 0742  GLUCAP 121* 77 112* 100* 122*    D-Dimer: No results for input(s): DDIMER in the last 72 hours. Hgb A1c: No results for input(s): HGBA1C in the last 72 hours.  Lipid Profile: No results for input(s): CHOL, HDL, LDLCALC, TRIG, CHOLHDL, LDLDIRECT in the last 72 hours. Thyroid function studies: No results for input(s): TSH, T4TOTAL, T3FREE, THYROIDAB in the last 72 hours.  Invalid input(s): FREET3 Anemia work up: No results for input(s): VITAMINB12, FOLATE, FERRITIN, TIBC, IRON, RETICCTPCT in the last 72 hours. Sepsis Labs: Recent Labs  Lab 09/03/21 1050 09/05/21 0648 09/05/21 1210 09/05/21 1749 09/06/21 0301  WBC 15.2* 12.0*  --   --  8.3  LATICACIDVEN  --   --  1.6 1.3  --     Microbiology Recent Results (from the past 240 hour(s))  Blood culture (routine x 2)     Status: None (Preliminary result)   Collection Time: 09/05/21 11:18 AM   Specimen: BLOOD  Result Value Ref Range Status   Specimen Description BLOOD RIGHT  ANTECUBITAL  Final   Special Requests   Final    BOTTLES DRAWN AEROBIC AND ANAEROBIC Blood Culture adequate volume   Culture   Final    NO GROWTH 4 DAYS Performed at [facility], [address] [place], [address] [place]    Report Status PENDING  Incomplete  Resp Panel by RT-PCR (Flu A&B, Covid) Peripheral     Status: None   Collection Time: 09/05/21 11:18 AM   Specimen: Peripheral; Nasopharyngeal(NP) swabs in vial transport medium  Result Value Ref Range Status   SARS Coronavirus 2 by RT PCR NEGATIVE NEGATIVE Final    Comment: (NOTE) SARS-CoV-2 target nucleic acids are NOT DETECTED.  The SARS-CoV-2 RNA is generally detectable in upper respiratory specimens during the  acute phase of infection. The lowest concentration of SARS-CoV-2 viral copies this assay can detect is 138 copies/mL. A negative result does not preclude SARS-Cov-2 infection and should not be used as the sole basis for treatment or other patient management decisions. A negative result may occur with  improper specimen collection/handling, submission of specimen other than nasopharyngeal swab, presence of viral mutation(s) within the areas targeted by this assay, and inadequate number of viral copies(<138 copies/mL). A negative result must be combined with clinical observations, patient history, and epidemiological information. The expected result is Negative.  Fact Sheet for Patients:  EntrepreneurPulse.com.au  Fact Sheet for Healthcare Providers:  IncredibleEmployment.be  This test is no t yet approved or cleared by the [place] FDA and  has been authorized for detection and/or diagnosis of SARS-CoV-2 by FDA under an Emergency Use Authorization (EUA). This EUA will remain  in effect (meaning this test can be used) for the duration of the COVID-19 declaration under Section 564(b)(1) of the Act, 21 U.S.C.section 360bbb-3(b)(1), unless the authorization is terminated   or revoked sooner.       Influenza A by PCR NEGATIVE NEGATIVE Final   Influenza B by PCR NEGATIVE NEGATIVE Final    Comment: (NOTE) The Xpert Xpress SARS-CoV-2/FLU/RSV plus assay is intended as an aid in the diagnosis of influenza from Nasopharyngeal swab specimens and should not be used as a sole basis for treatment. Nasal washings and aspirates are unacceptable for Xpert Xpress SARS-CoV-2/FLU/RSV testing.  Fact Sheet for Patients: EntrepreneurPulse.com.au  Fact Sheet for Healthcare Providers: IncredibleEmployment.be  This test is not yet approved or cleared by the [place] FDA and has been authorized for detection and/or diagnosis of SARS-CoV-2 by FDA under an Emergency Use Authorization (EUA). This EUA will remain in effect (meaning this test can be used) for the duration of the COVID-19 declaration under Section 564(b)(1) of the Act, 21 U.S.C. section 360bbb-3(b)(1), unless the authorization is terminated or revoked.  Performed at [facility], [place] [address] [place], [place] [address]   Blood culture (routine x 2)     Status: None (Preliminary result)   Collection Time: 09/05/21 12:29 PM   Specimen: BLOOD  Result Value Ref Range Status   Specimen Description BLOOD SITE NOT SPECIFIED  Final   Special Requests   Final    BOTTLES DRAWN AEROBIC AND ANAEROBIC Blood Culture results may not be optimal due to an excessive volume of blood received in culture bottles   Culture   Final    NO GROWTH 4 DAYS Performed at [facility], [place] [address], [place], [place] [address]    Report Status PENDING  Incomplete     Medications:    amLODipine  10 mg Oral Daily   benazepril  10 mg Oral Daily   docusate sodium  100 mg Oral BID   hydrochlorothiazide  12.5 mg Oral Daily   insulin aspart  0-15 Units Subcutaneous TID WC   insulin aspart  4 Units Subcutaneous TID WC   metoprolol succinate  50 mg Oral Daily    mometasone-formoterol  2 puff Inhalation BID   pantoprazole  40 mg Oral Daily   rivaroxaban  20 mg Oral Q supper   sodium chloride flush  3 mL Intravenous Q12H   Continuous Infusions:  piperacillin-tazobactam (ZOSYN)  IV 3.375 g (09/09/21 0414)   vancomycin 1,000 mg (09/08/21 1045)      LOS: 4 days   Charlynne Cousins  [facility]  09/09/2021, 8:53  AM

## 2021-09-09 NOTE — Progress Notes (Signed)
Paged MD with concerns of pts breathing. Pt stating it is getting harder for her to swallow and also to take deep breaths in. Neck appears to be more swollen. MD to come to bedside.

## 2021-09-09 NOTE — Progress Notes (Signed)
Physical Therapy Treatment Patient Details Name: Kristy [name] MRN: [id number] DOB: 03/01/1941 Today's Date: 09/09/2021   History of Present Illness 80 y/o female presented to ED on 11/18 for neck swelling worsening, trouble swallowing, and SOB. Recently seen in ED on 11/14 & 11/16 for similar symptoms with CT of neck showing submandibular cellulitis vs possible salivary duct stone and was prescribed antibiotics and sent home. PT signed off 11/19, reconsulted 11/21 for worsening mobility. PMH: DM, asthma, HTN    PT Comments    Pt continues to have a staggering gait pattern requiring min assist without support and min guard assist with the support of the IV pole.  She continues to get dyspnic 2/4 at the end of our walk, but remains in the low 90s on RA.  I would like to try her on a RW next session as I bet she could be supervision or mod I for gait, conserve energy and be able to walk further.  PT will continue to follow acutely for safe mobility progression.  Recommendations for follow up therapy are one component of a multi-disciplinary discharge planning process, led by the attending physician.  Recommendations may be updated based on patient status, additional functional criteria and insurance authorization.  Follow Up Recommendations  No PT follow up     Assistance Recommended at Discharge PRN  Equipment Recommendations  None recommended by PT    Recommendations for Other Services       Precautions / Restrictions Precautions Precautions: Fall Precaution Comments: watch sats     Mobility  Bed Mobility               General bed mobility comments: Pt OOB in chair    Transfers Overall transfer level: Needs assistance Equipment used: None Transfers: Sit to/from Stand Sit to Stand: Min guard           General transfer comment: for safety, slow to rise and reaching for environment once standing.    Ambulation/Gait Ambulation/Gait assistance: Min assist;Min  guard Gait Distance (Feet): 15 Feet (15', 130') Assistive device: IV Pole;1 person hand held assist Gait Pattern/deviations: Step-through pattern;Staggering left;Staggering right Gait velocity: decr Gait velocity interpretation: 1.31 - 2.62 ft/sec, indicative of limited community ambulator   General Gait Details: Pt with staggering gait pattern, min hand held assist if she doesn't hold the IV pole.  I asked her if she would like to try to the RW and she said no, we may have to just insist she try it next session.   Stairs             Wheelchair Mobility    Modified Rankin (Stroke Patients Only)       Balance Overall balance assessment: Needs assistance Sitting-balance support: Feet supported;No upper extremity supported Sitting balance-Leahy Scale: Good     Standing balance support: Single extremity supported Standing balance-Leahy Scale: Poor Standing balance comment: reliant on one UE support for balance in standing.                            Cognition Arousal/Alertness: Awake/alert Behavior During Therapy: WFL for tasks assessed/performed Overall Cognitive Status: Within Functional Limits for tasks assessed                                          Exercises      General Comments  General comments (skin integrity, edema, etc.): VSS on RA throughout, lowest O2 sat was 90%      Pertinent Vitals/Pain Pain Assessment: Faces Faces Pain Scale: Hurts little more Pain Location: neck Pain Descriptors / Indicators: Grimacing;Guarding Pain Intervention(s): Limited activity within patient's tolerance;Monitored during session;Repositioned;Ice applied    Home Living                          Prior Function            PT Goals (current goals can now be found in the care plan section) Acute Rehab PT Goals Patient Stated Goal: to go home and be able to eat again Progress towards PT goals: Progressing toward goals     Frequency    Min 3X/week      PT Plan Current plan remains appropriate    Co-evaluation              AM-PAC PT "6 Clicks" Mobility   Outcome Measure  Help needed turning from your back to your side while in a flat bed without using bedrails?: A Little Help needed moving from lying on your back to sitting on the side of a flat bed without using bedrails?: A Little Help needed moving to and from a bed to a chair (including a wheelchair)?: A Little Help needed standing up from a chair using your arms (e.g., wheelchair or bedside chair)?: A Little Help needed to walk in hospital room?: A Little Help needed climbing 3-5 steps with a railing? : A Little 6 Click Score: 18    End of Session   Activity Tolerance: Patient tolerated treatment well;Patient limited by fatigue Patient left: in chair;with call bell/phone within reach;with chair alarm set   PT Visit Diagnosis: Unsteadiness on feet (R26.81);Muscle weakness (generalized) (M62.81)     Time: 1340-1402 PT Time Calculation (min) (ACUTE ONLY): 22 min  Charges:  $Gait Training: 8-22 mins                    Corinna Capra, PT, DPT  Acute Rehabilitation Ortho Tech Supervisor [phone] pager [phone]) [phone] office

## 2021-09-09 NOTE — Progress Notes (Signed)
ENT PROGRESS NOTE   Subjective: Patient seen and examined at bedside. She reports increased pain and swelling of left neck. Tolerating secretions but endorses odynophagia. No shortness of breath. Patient has not been using sialogogues or warm compresses.  Objective: Vital signs in last 24 hours: Temp:  [98.4 F (36.9 C)-99.3 F (37.4 C)] 99.3 F (37.4 C) (11/22 1524) Pulse Rate:  [77-87] 85 (11/22 1524) Resp:  [11-16] 11 (11/22 1524) BP: (132-159)/(74-89) 144/79 (11/22 1524) SpO2:  [90 %-96 %] 91 % (11/22 1524)  CONSTITUTIONAL: well developed, nourished, in no acute distress PULMONARY/CHEST WALL: effort normal and no stridor, no stertor, hoarse voice HENT: Head : normocephalic and atraumatic Nose: nose normal and no purulence Mouth/Throat:  Mouth: edema of floor of mouth without induration or fluctuance. No saliva expressed from Wharton's ducts, no purulence.  Throat: oropharynx clear and moist Mucous membranes: normal EYES: conjunctiva normal, EOM normal and PERRL NECK: Significant edema and induration of left submandibular region without palpable fluctuance. Mild erythema of overlying skin.   Lab Results  Component Value Date   WBC 8.3 09/06/2021   HGB 10.7 (L) 09/06/2021   HCT 32.4 (L) 09/06/2021   MCV 89.8 09/06/2021   PLT 309 09/06/2021    Recent Labs    09/07/21 1009 09/08/21 0434  NA 142 139  K 3.2* 3.7  CL 105 104  CO2 29 28  GLUCOSE 97 92  BUN 11 6*  CREATININE 0.63 0.71  CALCIUM 9.5 8.5*    Medications: I have reviewed the patient's current medications.  New Imaging: Last CT 11/20 reviewed   Pathology: None  Assessment/Plan: Kristy [name] is a 80 y/o F with sialoadenitis without evidence of drainable abscess on most recent imaging performed 11/20. Imaging demonstrated interval improvement from previous CT scan. On Vancomycin and Zosyn. No leukocytosis.   On exam, patient has findings of acute sialoadenitis. There is marked edema and induration of  the left submandibular gland without palpable fluctuance. There is edema of the floor of mouth without induration.   Per MAR, patient has not received any Decadron since initial presentation. Recommend 10mg  IV Decadron now, then 8mg  Q8H for 2 doses, then 4mg  Q8H for 2 doses. Recommend application of warm compresses and gentle massage every 2 hours. Recommend frequent use of sialogogues such as lemon slices and sour candies, as well as aggressive oral hydration.   Recommend continued close clinical monitoring.    LOS: 4 days    , DO [facility] 09/09/2021, 5:57 PM

## 2021-09-10 DIAGNOSIS — L03221 Cellulitis of neck: Secondary | ICD-10-CM | POA: Diagnosis not present

## 2021-09-10 LAB — CULTURE, BLOOD (ROUTINE X 2)
Culture: NO GROWTH
Culture: NO GROWTH
Special Requests: ADEQUATE

## 2021-09-10 LAB — GLUCOSE, CAPILLARY
Glucose-Capillary: 291 mg/dL — ABNORMAL HIGH (ref 70–99)
Glucose-Capillary: 330 mg/dL — ABNORMAL HIGH (ref 70–99)

## 2021-09-10 MED ORDER — IPRATROPIUM-ALBUTEROL 0.5-2.5 (3) MG/3ML IN SOLN
3.0000 mL | Freq: Two times a day (BID) | RESPIRATORY_TRACT | Status: DC
  Administered 2021-09-10: 3 mL via RESPIRATORY_TRACT
  Filled 2021-09-10 (×2): qty 3

## 2021-09-10 MED ORDER — ENSURE ENLIVE PO LIQD
237.0000 mL | Freq: Two times a day (BID) | ORAL | Status: DC
  Administered 2021-09-10 – 2021-09-12 (×4): 237 mL via ORAL

## 2021-09-10 MED ORDER — ADULT MULTIVITAMIN W/MINERALS CH
1.0000 | ORAL_TABLET | Freq: Every day | ORAL | Status: DC
  Administered 2021-09-10 – 2021-09-12 (×3): 1 via ORAL
  Filled 2021-09-10 (×3): qty 1

## 2021-09-10 MED ORDER — INSULIN GLARGINE-YFGN 100 UNIT/ML ~~LOC~~ SOLN
5.0000 [IU] | Freq: Every day | SUBCUTANEOUS | Status: DC
  Administered 2021-09-10 – 2021-09-11 (×2): 5 [IU] via SUBCUTANEOUS
  Filled 2021-09-10 (×3): qty 0.05

## 2021-09-10 NOTE — Progress Notes (Signed)
Initial Nutrition Assessment  DOCUMENTATION CODES:   Not applicable  INTERVENTION:   Recommend liberalizing pt diet to regular to provide pt with greater options for intake. Received ok from MD.  Encourage good PO intake Ensure Enlive po BID, each supplement provides 350 kcal and 20 grams of protein Multivitamin w/ minerals daily  NUTRITION DIAGNOSIS:   Increased nutrient needs related to acute illness as evidenced by estimated needs.  GOAL:   Patient will meet greater than or equal to 90% of their needs  MONITOR:   PO intake, I & O's, Labs, Supplement acceptance  REASON FOR ASSESSMENT:   Consult Enteral/tube feeding initiation and management  ASSESSMENT:   80 y.o. female  presented to the ED with ongoing peritonsillar abscess and has been being treated with antibiotics x1 week; unable to tolerate solids, and increasing pain. PMH includes GERD, T2DM, and HTN. Pt admitted with neck cellulitis.   Pt was in good spirits, very pleasant, sitting in chair at bedside. Pt reports that she feels so much better since receiving new medicines last night.  Pt reports that her appetite was normal up until 08/30/2021 when she first noticed pain and swelling in her neck. Pt reports that she eats very healthy and is aware of her choices to manage her diabetes; pt denies use of insulin a home, just metformin. Pt reports that for ~1 week prior to admission she was mainly eating soups and drinking liquids because it hurt too much to swallow. Pt stated that she was in so much pain that she couldn't chew.   Pt stated that she had a yogurt, applesauce, and crackers this morning with no difficulty swallowing. Pt would not like to proceed with Cortrak placement at this time due to being able to swallow better; MD messaged.   Pt reports that her UBW is 158# and has not had any weight loss recently. Per EMR, pt has not had any weight loss within the past year. Pt reports that she use to be very active,  bowling twice per week and walking 3 miles per day, but now she just walks her dog for exercise.   Discussed ONS with pt, that they will be useful until she is able to eat much better and is not limited to just soft foods. Pt agreeable and said that she is willing to do whatever it takes to get better.   Pt with no other concerns or questions at this time.   Medications reviewed and include: Decadron, Colace, Hydrochlorothiazide, SSI 0-15 units TID + 4 units TID, Protonix, Miralax, IV antibiotics Labs reviewed: BUN 6, Hgb A1c 5.8%, 24 hr BG trends 109-128  NUTRITION - FOCUSED PHYSICAL EXAM:  Flowsheet Row Most Recent Value  Orbital Region No depletion  Upper Arm Region No depletion  Thoracic and Lumbar Region No depletion  Buccal Region No depletion  Temple Region No depletion  Clavicle Bone Region No depletion  Clavicle and Acromion Bone Region No depletion  Scapular Bone Region No depletion  Dorsal Hand No depletion  Patellar Region Mild depletion  Anterior Thigh Region Mild depletion  Posterior Calf Region Mild depletion  Edema (RD Assessment) None  Hair Reviewed  Eyes Reviewed  Mouth Reviewed  Skin Reviewed  Nails Reviewed       Diet Order:   Diet Order             Diet regular Room service appropriate? Yes; Fluid consistency: Thin  Diet effective now  EDUCATION NEEDS:   No education needs have been identified at this time  Skin:  Skin Assessment: Reviewed RN Assessment  Last BM:  09/08/2021  Height:   Ht Readings from Last 1 Encounters:  09/03/21 5\' 1"  (1.549 m)    Weight:   Wt Readings from Last 1 Encounters:  09/03/21 71.7 kg    Ideal Body Weight:  47.7 kg  BMI:  There is no height or weight on file to calculate BMI.  Estimated Nutritional Needs:   Kcal:  1500-1700  Protein:  75-90 grams  Fluid:  > 1.5 L    Traylon Schimming BS, PLDN Clinical Dietitian See AMiON for contact information.

## 2021-09-10 NOTE — Progress Notes (Signed)
NAME:  Kristy [name], MRN:  [id number], DOB:  [birth year], LOS: 5 ADMISSION DATE:  09/05/2021, CONSULTATION DATE:  09/09/2021 REFERRING MD:  Dr David Stall, CHIEF COMPLAINT:  Submandibular cellulitis    History of Present Illness:  Kristy [name] is a 80 y.o. female with a PMH significant for HTN, type 2 diabetes, asthma, and GERD who presented 11/18 for complaints of neck swelling. Patient was seen at this facility on 11/14 and diagnosis with peritonsillitis abscess and treated with Rocephin x1 and Augmentin x10 days.  On PCP eval 11/16 she was seen with SOB, stridor, and raspy voice and referred back to to the ER where she received dose of vanc  and was told to continue Augmentin and to follow up with EN.   On ED arrival 11/18 she was seen with worse pain and SOB, CT reveled progression of infection and patient was admitted per [facility].   Afternoon of 11/22 patient expressed worsening sore throat with difficulty swallowing. Given extensive history with concern for ability to protect airway PCCM consulted.   Pertinent  Medical History  HTN, type 2 diabetes, asthma, and GERD  Significant Hospital Events: Including procedures, antibiotic start and stop dates in addition to other pertinent events   11/18 admitted for submandibular cellulitis with stridor 11/22 PCCM consulted   Imaging   11/20 Overall slightly improved inflammatory changes centered in the left submandibular region also involving the posterior triangle likely reflecting improving cellulitis. The left submandibular gland is mildly enlarged compared to the right and may reflect sialoadenitis. Previously seen edema in the hypopharynx/larynx has improved. The airway is patent. No evidence of abscess.  Antibiotics  11/20 Vancomycin > 11/21 Zosyn >    Interim History / Subjective:   States that she feels much improved.  Left neck swelling has decreased.  Voice is improved.  Breathing has improved  Objective   Blood pressure (!)  148/76, pulse 86, temperature 98.2 F (36.8 C), temperature source Oral, resp. rate 18, SpO2 97 %.        Intake/Output Summary (Last 24 hours) at 09/10/2021 1118 Last data filed at 09/09/2021 1500 Gross per 24 hour  Intake 400 ml  Output --  Net 400 ml   There were no vitals filed for this visit.  Examination: General: Comfortable woman, sitting up in chair on room air HEENT: Persistent left mandibular swelling, tracking anteriorly.  Smaller per her report.  No stridor Neuro: Awake, alert, appropriate, nonfocal CV: Regular, distant, no murmur PULM: Clear bilaterally GI: Nondistended, positive bowel sounds Extremities: No edema Skin: No rash  Resolved Hospital Problem list     Assessment & Plan:  Submandibular cellulitis of neck -CT 11/20 remains stable, without abscess -No evidence of stridor or airway compromise P: -Agree with current management including corticosteroids as her neck swelling resolves -Empiric antibiotics -Appreciate ENT evaluation -Please cool if any problems or issues.   Asthma P: -Continue DuoNeb, Pulmicort nebs as ordered. -Likely switch back to her usual Dulera on 11/24   Please call if we can assist in any way    Labs   CBC: Recent Labs  Lab 09/05/21 0648 09/06/21 0301  WBC 12.0* 8.3  NEUTROABS 7.9*  --   HGB 12.0 10.7*  HCT 37.6 32.4*  MCV 93.3 89.8  PLT 384 309    Basic Metabolic Panel: Recent Labs  Lab 09/05/21 0648 09/06/21 0301 09/07/21 1009 09/08/21 0434  NA 136 137 142 139  K 3.1* 3.4* 3.2* 3.7  CL 99 101  105 104  CO2 24 27 29 28   GLUCOSE 126* 155* 97 92  BUN 17 12 11  6*  CREATININE 0.83 0.61 0.63 0.71  CALCIUM 9.5 8.8* 9.5 8.5*   GFR: Estimated Creatinine Clearance: 49.1 mL/min (by C-G formula based on SCr of 0.71 mg/dL). Recent Labs  Lab 09/05/21 0648 09/05/21 1210 09/05/21 1749 09/06/21 0301  WBC 12.0*  --   --  8.3  LATICACIDVEN  --  1.6 1.3  --      Critical care time: NA     09/07/21,  MD, PhD 09/10/2021, 11:18 AM [facility] [phone] or if no answer before 7:00PM call [phone] For any issues after 7:00PM please call eLink [phone]

## 2021-09-10 NOTE — Progress Notes (Signed)
   ENT Progress Note: HD #5   Subjective: Significant clinical improvement over the last 12 hours.  Objective: Vital signs in last 24 hours: Temp:  [97.7 F (36.5 C)-98.9 F (37.2 C)] 98.8 F (37.1 C) (11/23 1445) Pulse Rate:  [81-89] 87 (11/23 1445) Resp:  [13-20] 17 (11/23 1445) BP: (130-150)/(71-100) 130/71 (11/23 1445) SpO2:  [92 %-97 %] 94 % (11/23 1445) Weight change:  Last BM Date: 09/10/21  Intake/Output from previous day: 11/22 0701 - 11/23 0700 In: 920 [P.O.:920] Out: -  Intake/Output this shift: No intake/output data recorded.  Labs: No results for input(s): WBC, HGB, HCT, PLT in the last 72 hours. Recent Labs    09/08/21 0434  NA 139  K 3.7  CL 104  CO2 28  GLUCOSE 92  BUN 6*  CALCIUM 8.5*    Studies/Results: No results found.   PHYSICAL EXAM: Minimal swelling, no induration in the left periparotid/neck region. Some continued fullness in the submental and submandibular region.   Assessment/Plan: Patient reports significant improvement in discomfort and swelling over the last 12 hours since she began aggressive salivary precautions, sialagogues and hydration.  Continue intravenous antibiotics and steroids as prescribed.  Expect continued clinical improvement given current course.    Osborn Coho 09/10/2021, 4:23 PM

## 2021-09-10 NOTE — Progress Notes (Signed)
Pharmacy Antibiotic Note  Kristy [name] is a 80 y.o. female recently diagnosed with peritonsillar abscess on 11/14 and treated with Rocephin and then Augmentin.  Patient returned to the ED on 11/16 and received one dose of vancomycin and continued on Augmentin.  Now admitted on 09/05/21 with neck cellulitis. Currently on vancomycin and zosyn.   A vanc peak and trough collected yesterday were as follows: Vanc peak 35 Vanc trough 8 Calculated AUC 465 (therapeutic)   Plan: Continue vancomycin 1 gm IV Q 24 hours  Continue Zosyn 3.375 gm IV Q 8 hours  Monitor CBC, renal fx, cultures and clinical progress      Temp (24hrs), Avg:98.4 F (36.9 C), Min:97.7 F (36.5 C), Max:99.3 F (37.4 C)  Recent Labs  Lab 09/03/21 1050 09/05/21 0648 09/05/21 1210 09/05/21 1749 09/06/21 0301 09/07/21 1009 09/08/21 0434 09/09/21 1002 09/09/21 1339  WBC 15.2* 12.0*  --   --  8.3  --   --   --   --   CREATININE 0.77 0.83  --   --  0.61 0.63 0.71  --   --   LATICACIDVEN  --   --  1.6 1.3  --   --   --   --   --   VANCOTROUGH  --   --   --   --   --   --   --  8*  --   VANCOPEAK  --   --   --   --   --   --   --   --  35     Estimated Creatinine Clearance: 49.1 mL/min (by C-G formula based on SCr of 0.71 mg/dL).    Allergies  Allergen Reactions   Clonidine Hives   Vanc 11/19 >> Unasyn 11/18 >> 11/21 Zosyn 11/21 >>   11/18 BCx - negF  Vinnie Level, PharmD., BCPS, BCCCP Clinical Pharmacist Please refer to [facility] for unit-specific pharmacist

## 2021-09-10 NOTE — Progress Notes (Signed)
[facility]  PROGRESS NOTE  Kristy [name] ZDG:[id number] DOB: Feb 27, 1941 DOA: 09/05/2021 PCP: Philip Aspen, Limmie Patricia, MD   Brief HPI:   80 year old female with past medical history of diabetes mellitus type 2, asthma who was initially seen on 09/01/2021 and was diagnosed with peritonsillar abscess and treated with Rocephin, Augmentin and Decadron follow-up with PCP who referred her to ED.  CT of the head and neck showed early cellulitis and possible salivary duct stone, started empirically on vancomycin and continuing Augmentin.  She was supposed to follow-up with the ENT however she was unable to tolerate solids came back to the ED.  Repeat CT scan showed progressive infection, no airway compromise.  ENT was consulted patient started on Unasyn and Decadron.    Subjective   Patient seen, says that she has significantly improved after starting Decadron by ENT last night.  She has been able to swallow food.  Able to talk.  Swelling has significantly improved.   Assessment/Plan:   Left submandibular inflammation/cellulitis -Seen by ENT, no indication for incision and drainage -Significantly improved after starting on Decadron taper -Is able to swallow and talk better today -We will start on regular diet -Continue empiric antibiotics and Decadron   Diabetes mellitus type 2 -CBGs elevated due to Decadron -Continue sliding scale insulin with NovoLog -Continue NovoLog 4 units 3 times daily -Add Lantus 5 units subcu nightly   Hypertension -Continue Toprol-XL, Norvasc, hydrochlorothiazide, benazepril -BP well controlled  Asthma -Continue inhalers as needed  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 -Continue Xarelto      Medications     amLODipine  10 mg Oral Daily   benazepril  10 mg Oral Daily   budesonide (PULMICORT) nebulizer solution  0.25 mg Nebulization BID   dexamethasone (DECADRON) injection  4 mg Intravenous Q8H   docusate sodium  100 mg  Oral BID   feeding supplement  237 mL Oral BID BM   hydrochlorothiazide  12.5 mg Oral Daily   insulin aspart  0-15 Units Subcutaneous TID WC   insulin aspart  4 Units Subcutaneous TID WC   ipratropium-albuterol  3 mL Nebulization BID   metoprolol succinate  50 mg Oral Daily   multivitamin with minerals  1 tablet Oral Daily   pantoprazole  40 mg Oral Daily   polyethylene glycol  17 g Oral BID   rivaroxaban  20 mg Oral Q supper   sodium chloride flush  3 mL Intravenous Q12H     Data Reviewed:   CBG:  Recent Labs  Lab 09/09/21 0742 09/09/21 1127 09/09/21 1528 09/09/21 2057 09/10/21 1116  GLUCAP 122* 128* 109* 121* 291*    SpO2: 94 % O2 Flow Rate (L/min): 1 L/min    Vitals:   09/10/21 0728 09/10/21 0834 09/10/21 1112 09/10/21 1445  BP: (!) 148/76  134/83 130/71  Pulse: 87 86 89 87  Resp: 19 18 20 17   Temp: 97.7 F (36.5 C)  98.2 F (36.8 C) 98.8 F (37.1 C)  TempSrc: Oral  Oral Oral  SpO2: 97%  94% 94%    No intake or output data in the 24 hours ending 09/10/21 1951  11/22 0701 - 11/23 1900 In: 920 [P.O.:920] Out: -   There were no vitals filed for this visit.  Data Reviewed: Basic Metabolic Panel: Recent Labs  Lab 09/05/21 0648 09/06/21 0301 09/07/21 1009 09/08/21 0434  NA 136 137 142 139  K 3.1* 3.4* 3.2* 3.7  CL 99 101 105 104  CO2  24 27 29 28   GLUCOSE 126* 155* 97 92  BUN 17 12 11  6*  CREATININE 0.83 0.61 0.63 0.71  CALCIUM 9.5 8.8* 9.5 8.5*   Liver Function Tests: No results for input(s): AST, ALT, ALKPHOS, BILITOT, PROT, ALBUMIN in the last 168 hours. No results for input(s): LIPASE, AMYLASE in the last 168 hours. No results for input(s): AMMONIA in the last 168 hours. CBC: Recent Labs  Lab 09/05/21 0648 09/06/21 0301  WBC 12.0* 8.3  NEUTROABS 7.9*  --   HGB 12.0 10.7*  HCT 37.6 32.4*  MCV 93.3 89.8  PLT 384 309   Cardiac Enzymes: No results for input(s): CKTOTAL, CKMB, CKMBINDEX, TROPONINI in the last 168 hours. BNP (last 3  results) No results for input(s): BNP in the last 8760 hours.  ProBNP (last 3 results) No results for input(s): PROBNP in the last 8760 hours.  CBG: Recent Labs  Lab 09/09/21 0742 09/09/21 1127 09/09/21 1528 09/09/21 2057 09/10/21 1116  GLUCAP 122* 128* 109* 121* 291*       Radiology Reports  No results found.     Antibiotics: Anti-infectives (From admission, onward)    Start     Dose/Rate Route Frequency Ordered Stop   09/08/21 1230  piperacillin-tazobactam (ZOSYN) IVPB 3.375 g        3.375 g 12.5 mL/hr over 240 Minutes Intravenous Every 8 hours 09/08/21 1143     09/08/21 1130  Ampicillin-Sulbactam (UNASYN) 3 g in sodium chloride 0.9 % 100 mL IVPB  Status:  Discontinued        3 g 200 mL/hr over 30 Minutes Intravenous Every 6 hours 09/08/21 1044 09/08/21 1102   09/07/21 1000  vancomycin (VANCOREADY) IVPB 1000 mg/200 mL        1,000 mg 200 mL/hr over 60 Minutes Intravenous Every 24 hours 09/06/21 0832     09/06/21 0930  vancomycin (VANCOREADY) IVPB 1500 mg/300 mL        1,500 mg 150 mL/hr over 120 Minutes Intravenous  Once 09/06/21 0832 09/06/21 1415   09/05/21 1330  Ampicillin-Sulbactam (UNASYN) 3 g in sodium chloride 0.9 % 100 mL IVPB  Status:  Discontinued        3 g 200 mL/hr over 30 Minutes Intravenous Every 6 hours 09/05/21 1331 09/08/21 0954         DVT prophylaxis: Xarelto  Code Status: Partial code  Family Communication: No family at bedside   Consultants: ENT  Procedures:     Objective    Physical Examination:  General-appears in no acute distress Heart-S1-S2, regular, no murmur auscultated HEENT-left submandibular swelling noted Lungs-clear to auscultation bilaterally, no wheezing or crackles auscultated Abdomen-soft, nontender, no organomegaly Extremities-no edema in the lower extremities Neuro-alert, oriented x3, no focal deficit noted   Status is: Inpatient  Dispo: The patient is from: Home              Anticipated d/c  is to: Home              Anticipated d/c date is: 09/14/2021              Patient currently not stable for discharge  Barrier to discharge-significant swelling of submandibular gland  COVID-19 Labs  No results for input(s): DDIMER, FERRITIN, LDH, CRP in the last 72 hours.  Lab Results  Component Value Date   SARSCOV2NAA NEGATIVE 09/05/2021   [place] NEGATIVE 12/05/2020   SARSCOV2NAA NOT DETECTED 04/19/2019            Recent Results (  from the past 240 hour(s))  Blood culture (routine x 2)     Status: None   Collection Time: 09/05/21 11:18 AM   Specimen: BLOOD  Result Value Ref Range Status   Specimen Description BLOOD RIGHT ANTECUBITAL  Final   Special Requests   Final    BOTTLES DRAWN AEROBIC AND ANAEROBIC Blood Culture adequate volume   Culture   Final    NO GROWTH 5 DAYS Performed at [facility], [address], [place], [place] [address]    Report Status 09/10/2021 FINAL  Final  Resp Panel by RT-PCR (Flu A&B, Covid) Peripheral     Status: None   Collection Time: 09/05/21 11:18 AM   Specimen: Peripheral; Nasopharyngeal(NP) swabs in vial transport medium  Result Value Ref Range Status   SARS Coronavirus 2 by RT PCR NEGATIVE NEGATIVE Final    Comment: (NOTE) SARS-CoV-2 target nucleic acids are NOT DETECTED.  The SARS-CoV-2 RNA is generally detectable in upper respiratory specimens during the acute phase of infection. The lowest concentration of SARS-CoV-2 viral copies this assay can detect is 138 copies/mL. A negative result does not preclude SARS-Cov-2 infection and should not be used as the sole basis for treatment or other patient management decisions. A negative result may occur with  improper specimen collection/handling, submission of specimen other than nasopharyngeal swab, presence of viral mutation(s) within the areas targeted by this assay, and inadequate number of viral copies(<138 copies/mL). A negative result must be combined  with clinical observations, patient history, and epidemiological information. The expected result is Negative.  Fact Sheet for Patients:  BloggerCourse.com  Fact Sheet for Healthcare Providers:  SeriousBroker.it  This test is no t yet approved or cleared by the [place] FDA and  has been authorized for detection and/or diagnosis of SARS-CoV-2 by FDA under an Emergency Use Authorization (EUA). This EUA will remain  in effect (meaning this test can be used) for the duration of the COVID-19 declaration under Section 564(b)(1) of the Act, 21 U.S.C.section 360bbb-3(b)(1), unless the authorization is terminated  or revoked sooner.       Influenza A by PCR NEGATIVE NEGATIVE Final   Influenza B by PCR NEGATIVE NEGATIVE Final    Comment: (NOTE) The Xpert Xpress SARS-CoV-2/FLU/RSV plus assay is intended as an aid in the diagnosis of influenza from Nasopharyngeal swab specimens and should not be used as a sole basis for treatment. Nasal washings and aspirates are unacceptable for Xpert Xpress SARS-CoV-2/FLU/RSV testing.  Fact Sheet for Patients: BloggerCourse.com  Fact Sheet for Healthcare Providers: SeriousBroker.it  This test is not yet approved or cleared by the [place] FDA and has been authorized for detection and/or diagnosis of SARS-CoV-2 by FDA under an Emergency Use Authorization (EUA). This EUA will remain in effect (meaning this test can be used) for the duration of the COVID-19 declaration under Section 564(b)(1) of the Act, 21 U.S.C. section 360bbb-3(b)(1), unless the authorization is terminated or revoked.  Performed at [facility], [address], [place], [place] [address]   Blood culture (routine x 2)     Status: None   Collection Time: 09/05/21 12:29 PM   Specimen: BLOOD  Result Value Ref Range Status   Specimen Description BLOOD SITE NOT  SPECIFIED  Final   Special Requests   Final    BOTTLES DRAWN AEROBIC AND ANAEROBIC Blood Culture results may not be optimal due to an excessive volume of blood received in culture bottles   Culture   Final  NO GROWTH 5 DAYS Performed at [facility], [place] [address], [place],  [address]    Report Status 09/10/2021 FINAL  Final    Oswald Hillock   [facility] If 7PM-7AM, please contact night-coverage at [email or web address], Office  [phone]   09/10/2021, 7:51 PM  LOS: 5 days

## 2021-09-11 LAB — GLUCOSE, CAPILLARY
Glucose-Capillary: 161 mg/dL — ABNORMAL HIGH (ref 70–99)
Glucose-Capillary: 249 mg/dL — ABNORMAL HIGH (ref 70–99)
Glucose-Capillary: 124 mg/dL — ABNORMAL HIGH (ref 70–99)
Glucose-Capillary: 158 mg/dL — ABNORMAL HIGH (ref 70–99)

## 2021-09-11 MED ORDER — MOMETASONE FURO-FORMOTEROL FUM 200-5 MCG/ACT IN AERO
2.0000 | INHALATION_SPRAY | Freq: Two times a day (BID) | RESPIRATORY_TRACT | Status: DC
  Administered 2021-09-11 – 2021-09-12 (×3): 2 via RESPIRATORY_TRACT
  Filled 2021-09-11: qty 8.8

## 2021-09-11 NOTE — Progress Notes (Signed)
[facility]  PROGRESS NOTE  Wilfrid Lundancy B Wiltse ZOX:[id number] DOB: 1941-07-20 DOA: 09/05/2021 PCP: Philip AspenHernandez Acosta, Limmie PatriciaEstela Y, MD   Brief HPI:   80 year old female with past medical history of diabetes mellitus type 2, asthma who was initially seen on 09/01/2021 and was diagnosed with peritonsillar abscess and treated with Rocephin, Augmentin and Decadron follow-up with PCP who referred her to ED.  CT of the head and neck showed early cellulitis and possible salivary duct stone, started empirically on vancomycin and continuing Augmentin.  She was supposed to follow-up with the ENT however she was unable to tolerate solids came back to the ED.  Repeat CT scan showed progressive infection, no airway compromise.  ENT was consulted patient started on Unasyn and Decadron.    Subjective   Patient seen and examined, complains of mild pain in the left side of cheek.   Assessment/Plan:   Left submandibular inflammation/cellulitis -Seen by ENT, no indication for incision and drainage -Significantly improved after starting on Decadron taper -Is able to swallow and talk better today -Started on regular diet -We will continue with empiric antibiotics and Decadron.   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 -Continue Xarelto      Medications     amLODipine  10 mg Oral Daily   benazepril  10 mg Oral Daily   docusate sodium  100 mg Oral BID   feeding supplement  237 mL Oral BID BM   hydrochlorothiazide  12.5 mg Oral Daily   insulin aspart  0-15 Units Subcutaneous TID WC   insulin aspart  4 Units Subcutaneous TID WC    insulin glargine-yfgn  5 Units Subcutaneous QHS   metoprolol succinate  50 mg Oral Daily   mometasone-formoterol  2 puff Inhalation BID   multivitamin with minerals  1 tablet Oral Daily   pantoprazole  40 mg Oral Daily   polyethylene glycol  17 g Oral BID   rivaroxaban  20 mg Oral Q supper   sodium chloride flush  3 mL Intravenous Q12H     Data Reviewed:   CBG:  Recent Labs  Lab 09/09/21 1528 09/09/21 2057 09/10/21 1116 09/10/21 2125 09/11/21 0742  GLUCAP 109* 121* 291* 330* 161*    SpO2: 96 % O2 Flow Rate (L/min): 1 L/min    Vitals:   09/11/21 0000 09/11/21 0337 09/11/21 0700 09/11/21 0853  BP:  (!) 145/87 (!) 134/99   Pulse:  81 80 80  Resp: (!) 21 (!) 25 17 16   Temp:  97.8 F (36.6 C) 98.6 F (37 C)   TempSrc:  Oral Oral   SpO2:  92% 96%      Intake/Output Summary (Last 24 hours) at 09/11/2021 0913 Last data filed at 09/10/2021 2116 Gross per 24 hour  Intake 3 ml  Output --  Net 3 ml    11/22 1901 - 11/24 0700 In: 3 [I.V.:3] Out: -   There were no vitals filed for this visit.  Data Reviewed: Basic Metabolic Panel: Recent Labs  Lab 09/05/21 0648 09/06/21 0301 09/07/21 1009 09/08/21 0434  NA 136 137 142 139  K 3.1* 3.4* 3.2* 3.7  CL 99 101 105 104  CO2 24 27 29  28  GLUCOSE 126* 155* 97 92  BUN 17 12 11  6*  CREATININE 0.83 0.61 0.63 0.71  CALCIUM 9.5 8.8* 9.5 8.5*   Liver Function Tests: No results for input(s): AST, ALT, ALKPHOS, BILITOT, PROT, ALBUMIN in the last 168 hours. No results for input(s): LIPASE, AMYLASE in the last 168 hours. No results for input(s): AMMONIA in the last 168 hours. CBC: Recent Labs  Lab 09/05/21 0648 09/06/21 0301  WBC 12.0* 8.3  NEUTROABS 7.9*  --   HGB 12.0 10.7*  HCT 37.6 32.4*  MCV 93.3 89.8  PLT 384 309   Cardiac Enzymes: No results for input(s): CKTOTAL, CKMB, CKMBINDEX, TROPONINI in the last 168 hours. BNP (last 3 results) No results for input(s): BNP in the last 8760 hours.  ProBNP (last  3 results) No results for input(s): PROBNP in the last 8760 hours.  CBG: Recent Labs  Lab 09/09/21 1528 09/09/21 2057 09/10/21 1116 09/10/21 2125 09/11/21 0742  GLUCAP 109* 121* 291* 330* 161*       Radiology Reports  No results found.     Antibiotics: Anti-infectives (From admission, onward)    Start     Dose/Rate Route Frequency Ordered Stop   09/08/21 1230  piperacillin-tazobactam (ZOSYN) IVPB 3.375 g        3.375 g 12.5 mL/hr over 240 Minutes Intravenous Every 8 hours 09/08/21 1143     09/08/21 1130  Ampicillin-Sulbactam (UNASYN) 3 g in sodium chloride 0.9 % 100 mL IVPB  Status:  Discontinued        3 g 200 mL/hr over 30 Minutes Intravenous Every 6 hours 09/08/21 1044 09/08/21 1102   09/07/21 1000  vancomycin (VANCOREADY) IVPB 1000 mg/200 mL        1,000 mg 200 mL/hr over 60 Minutes Intravenous Every 24 hours 09/06/21 0832     09/06/21 0930  vancomycin (VANCOREADY) IVPB 1500 mg/300 mL        1,500 mg 150 mL/hr over 120 Minutes Intravenous  Once 09/06/21 0832 09/06/21 1415   09/05/21 1330  Ampicillin-Sulbactam (UNASYN) 3 g in sodium chloride 0.9 % 100 mL IVPB  Status:  Discontinued        3 g 200 mL/hr over 30 Minutes Intravenous Every 6 hours 09/05/21 1331 09/08/21 0954         DVT prophylaxis: Xarelto  Code Status: Partial code  Family Communication: No family at bedside   Consultants: ENT  Procedures:     Objective    Physical Examination:  General-appears in no acute distress Heart-S1-S2, regular, no murmur auscultated HEENT-left neck swelling noted Lungs-clear to auscultation bilaterally, no wheezing or crackles auscultated Abdomen-soft, nontender, no organomegaly Extremities-no edema in the lower extremities Neuro-alert, oriented x3, no focal deficit noted  Status is: Inpatient  Dispo: The patient is from: Home              Anticipated d/c is to: Home              Anticipated d/c date is: 09/14/2021              Patient  currently not stable for discharge  Barrier to discharge-significant swelling of submandibular gland  COVID-19 Labs  No results for input(s): DDIMER, FERRITIN, LDH, CRP in the last 72 hours.  Lab Results  Component Value Date   SARSCOV2NAA NEGATIVE 09/05/2021   SARSCOV2NAA NEGATIVE 12/05/2020   SARSCOV2NAA NOT DETECTED 04/19/2019            Recent Results (from the past 240  hour(s))  Blood culture (routine x 2)     Status: None   Collection Time: 09/05/21 11:18 AM   Specimen: BLOOD  Result Value Ref Range Status   Specimen Description BLOOD RIGHT ANTECUBITAL  Final   Special Requests   Final    BOTTLES DRAWN AEROBIC AND ANAEROBIC Blood Culture adequate volume   Culture   Final    NO GROWTH 5 DAYS Performed at [facility], [address], [place], [place] [address]    Report Status 09/10/2021 FINAL  Final  Resp Panel by RT-PCR (Flu A&B, Covid) Peripheral     Status: None   Collection Time: 09/05/21 11:18 AM   Specimen: Peripheral; Nasopharyngeal(NP) swabs in vial transport medium  Result Value Ref Range Status   SARS Coronavirus 2 by RT PCR NEGATIVE NEGATIVE Final    Comment: (NOTE) SARS-CoV-2 target nucleic acids are NOT DETECTED.  The SARS-CoV-2 RNA is generally detectable in upper respiratory specimens during the acute phase of infection. The lowest concentration of SARS-CoV-2 viral copies this assay can detect is 138 copies/mL. A negative result does not preclude SARS-Cov-2 infection and should not be used as the sole basis for treatment or other patient management decisions. A negative result may occur with  improper specimen collection/handling, submission of specimen other than nasopharyngeal swab, presence of viral mutation(s) within the areas targeted by this assay, and inadequate number of viral copies(<138 copies/mL). A negative result must be combined with clinical observations, patient history, and epidemiological information. The expected  result is Negative.  Fact Sheet for Patients:  EntrepreneurPulse.com.au  Fact Sheet for Healthcare Providers:  IncredibleEmployment.be  This test is no t yet approved or cleared by the [place] FDA and  has been authorized for detection and/or diagnosis of SARS-CoV-2 by FDA under an Emergency Use Authorization (EUA). This EUA will remain  in effect (meaning this test can be used) for the duration of the COVID-19 declaration under Section 564(b)(1) of the Act, 21 U.S.C.section 360bbb-3(b)(1), unless the authorization is terminated  or revoked sooner.       Influenza A by PCR NEGATIVE NEGATIVE Final   Influenza B by PCR NEGATIVE NEGATIVE Final    Comment: (NOTE) The Xpert Xpress SARS-CoV-2/FLU/RSV plus assay is intended as an aid in the diagnosis of influenza from Nasopharyngeal swab specimens and should not be used as a sole basis for treatment. Nasal washings and aspirates are unacceptable for Xpert Xpress SARS-CoV-2/FLU/RSV testing.  Fact Sheet for Patients: EntrepreneurPulse.com.au  Fact Sheet for Healthcare Providers: IncredibleEmployment.be  This test is not yet approved or cleared by the [place] FDA and has been authorized for detection and/or diagnosis of SARS-CoV-2 by FDA under an Emergency Use Authorization (EUA). This EUA will remain in effect (meaning this test can be used) for the duration of the COVID-19 declaration under Section 564(b)(1) of the Act, 21 U.S.C. section 360bbb-3(b)(1), unless the authorization is terminated or revoked.  Performed at [facility], [place] [address], [place], [place] [address]   Blood culture (routine x 2)     Status: None   Collection Time: 09/05/21 12:29 PM   Specimen: BLOOD  Result Value Ref Range Status   Specimen Description BLOOD SITE NOT SPECIFIED  Final   Special Requests   Final    BOTTLES DRAWN AEROBIC AND ANAEROBIC Blood Culture  results may not be optimal due to an excessive volume of blood received in culture bottles   Culture   Final    NO GROWTH  5 DAYS Performed at [facility], [address] [place], [place] [address]    Report Status 09/10/2021 FINAL  Final    Meredeth Ide   [facility] If 7PM-7AM, please contact night-coverage at [email or web address], Office  [phone]   09/11/2021, 9:13 AM  LOS: 6 days

## 2021-09-11 NOTE — Progress Notes (Signed)
Patient ID: Kristy [name], female   DOB: 14-Nov-1940, 80 y.o.   MRN: [id number] Subjective: Feeling much better.  She continues to improve overall.  Objective: Vital signs in last 24 hours: Temp:  [97.8 F (36.6 C)-98.8 F (37.1 C)] 98.6 F (37 C) (11/24 0700) Pulse Rate:  [80-98] 80 (11/24 0853) Resp:  [16-25] 16 (11/24 0853) BP: (130-148)/(71-99) 134/99 (11/24 0700) SpO2:  [92 %-98 %] 96 % (11/24 0700) Weight change:  Last BM Date: 09/11/21  Intake/Output from previous day: 11/23 0701 - 11/24 0700 In: 3 [I.V.:3] Out: -  Intake/Output this shift: No intake/output data recorded.  PHYSICAL EXAM: Awake and alert.  Breathing and speaking clearly.  No external swelling visible but palpation of the left submandibular gland reveals that the gland itself is slightly tender and firm.  There is no surrounding cellulitis or skin edema.  Lab Results: No results for input(s): WBC, HGB, HCT, PLT in the last 72 hours. BMET No results for input(s): NA, K, CL, CO2, GLUCOSE, BUN, CREATININE, CALCIUM in the last 72 hours.  Studies/Results: No results found.  Medications: I have reviewed the patient's current medications.  Assessment/Plan: Progressing very nicely.  Continue current medical treatment.  Contact us if additional input required.  LOS: 6 days   Serena Colonel 09/11/2021, 11:59 AM

## 2021-09-12 LAB — BASIC METABOLIC PANEL
Anion gap: 8 (ref 5–15)
Anion gap: 8 (ref 5–15)
BUN: 18 mg/dL (ref 8–23)
BUN: 16 mg/dL (ref 8–23)
CO2: 30 mmol/L (ref 22–32)
CO2: 27 mmol/L (ref 22–32)
Calcium: 8.8 mg/dL — ABNORMAL LOW (ref 8.9–10.3)
Calcium: 8.9 mg/dL (ref 8.9–10.3)
Chloride: 102 mmol/L (ref 98–111)
Chloride: 103 mmol/L (ref 98–111)
Creatinine, Ser: 1.02 mg/dL — ABNORMAL HIGH (ref 0.44–1.00)
Creatinine, Ser: 0.89 mg/dL (ref 0.44–1.00)
GFR, Estimated: 55 mL/min — ABNORMAL LOW (ref >60)
GFR, Estimated: >60 mL/min (ref >60)
Glucose, Bld: 119 mg/dL — ABNORMAL HIGH (ref 70–99)
Glucose, Bld: 129 mg/dL — ABNORMAL HIGH (ref 70–99)
Potassium: 2.8 mmol/L — ABNORMAL LOW (ref 3.5–5.1)
Potassium: 3.5 mmol/L (ref 3.5–5.1)
Sodium: 140 mmol/L (ref 135–145)
Sodium: 138 mmol/L (ref 135–145)

## 2021-09-12 LAB — GLUCOSE, CAPILLARY
Glucose-Capillary: 147 mg/dL — ABNORMAL HIGH (ref 70–99)
Glucose-Capillary: 93 mg/dL (ref 70–99)
Glucose-Capillary: 118 mg/dL — ABNORMAL HIGH (ref 70–99)
Glucose-Capillary: 115 mg/dL — ABNORMAL HIGH (ref 70–99)

## 2021-09-12 MED ORDER — AMOXICILLIN-POT CLAVULANATE 875-125 MG PO TABS: 1.0000 | ORAL_TABLET | Freq: Two times a day (BID) | ORAL | 0 refills | Status: DC

## 2021-09-12 MED ORDER — AMOXICILLIN-POT CLAVULANATE 875-125 MG PO TABS
1.0000 | ORAL_TABLET | Freq: Two times a day (BID) | ORAL | Status: DC
  Administered 2021-09-12: 1 via ORAL
  Filled 2021-09-12 (×2): qty 1

## 2021-09-12 MED ORDER — PREDNISONE 10 MG PO TABS: ORAL_TABLET | ORAL | 0 refills | Status: DC

## 2021-09-12 MED ORDER — POTASSIUM CHLORIDE CRYS ER 20 MEQ PO TBCR
40.0000 meq | EXTENDED_RELEASE_TABLET | Freq: Once | ORAL | Status: AC
  Administered 2021-09-12: 40 meq via ORAL
  Filled 2021-09-12: qty 2

## 2021-09-12 MED ORDER — SODIUM CHLORIDE 0.9 % IV SOLN: INTRAVENOUS | Status: DC

## 2021-09-12 MED ORDER — POTASSIUM CHLORIDE 10 MEQ/100ML IV SOLN
10.0000 meq | INTRAVENOUS | Status: AC
  Administered 2021-09-12 (×3): 10 meq via INTRAVENOUS
  Filled 2021-09-12: qty 100

## 2021-09-12 NOTE — Progress Notes (Signed)
Physical Therapy Treatment Patient Details Name: Kristy [name] MRN: [id number] DOB: Jan 02, 1941 Today's Date: 09/12/2021   History of Present Illness 80 y/o female presented to ED on 11/18 for neck swelling worsening, trouble swallowing, and SOB. Recently seen in ED on 11/14 & 11/16 for similar symptoms with CT of neck showing submandibular cellulitis vs possible salivary duct stone and was prescribed antibiotics and sent home. PT signed off 11/19, reconsulted 11/21 for worsening mobility. PMH: DM, asthma, HTN    PT Comments    Patient progressing towards physical therapy goals. Patient at supervision level for mobility with no AD. Educated patient on energy conservation techniques, patient verbalized understanding. Patient with 2/4 DOE but patient reports being at baseline. No PT follow up recommended at this time.     Recommendations for follow up therapy are one component of a multi-disciplinary discharge planning process, led by the attending physician.  Recommendations may be updated based on patient status, additional functional criteria and insurance authorization.  Follow Up Recommendations  No PT follow up     Assistance Recommended at Discharge PRN  Equipment Recommendations  None recommended by PT    Recommendations for Other Services       Precautions / Restrictions Precautions Precautions: Fall Restrictions Weight Bearing Restrictions: No     Mobility  Bed Mobility               General bed mobility comments: Pt OOB in chair    Transfers Overall transfer level: Needs assistance Equipment used: None Transfers: Sit to/from Stand Sit to Stand: Supervision           General transfer comment: supervision for safety    Ambulation/Gait Ambulation/Gait assistance: Supervision Gait Distance (Feet): 200 Feet Assistive device: None Gait Pattern/deviations: Step-through pattern Gait velocity: decreased     General Gait Details: supervision for safety.  No overt LOB noted. Slight unsteadiness.   Stairs             Wheelchair Mobility    Modified Rankin (Stroke Patients Only)       Balance Overall balance assessment: Mild deficits observed, not formally tested                                          Cognition Arousal/Alertness: Awake/alert Behavior During Therapy: WFL for tasks assessed/performed Overall Cognitive Status: Within Functional Limits for tasks assessed                                          Exercises      General Comments        Pertinent Vitals/Pain Pain Assessment: No/denies pain    Home Living                          Prior Function            PT Goals (current goals can now be found in the care plan section) Acute Rehab PT Goals Patient Stated Goal: to go home and be able to eat again PT Goal Formulation: With patient Time For Goal Achievement: 09/22/21 Potential to Achieve Goals: Good Progress towards PT goals: Progressing toward goals    Frequency    Min 3X/week      PT Plan Current plan remains  appropriate    Co-evaluation              AM-PAC PT "6 Clicks" Mobility   Outcome Measure  Help needed turning from your back to your side while in a flat bed without using bedrails?: A Little Help needed moving from lying on your back to sitting on the side of a flat bed without using bedrails?: A Little Help needed moving to and from a bed to a chair (including a wheelchair)?: A Little Help needed standing up from a chair using your arms (e.g., wheelchair or bedside chair)?: A Little Help needed to walk in hospital room?: A Little Help needed climbing 3-5 steps with a railing? : A Little 6 Click Score: 18    End of Session   Activity Tolerance: Patient tolerated treatment well Patient left: in chair;with call bell/phone within reach Nurse Communication: Mobility status PT Visit Diagnosis: Unsteadiness on feet  (R26.81);Muscle weakness (generalized) (M62.81)     Time: 3419-3790 PT Time Calculation (min) (ACUTE ONLY): 17 min  Charges:  $Gait Training: 8-22 mins                     Harith Mccadden A. Dan Humphreys PT, DPT Acute Rehabilitation Services Pager [phone] Office [phone]    Viviann Spare 09/12/2021, 1:29 PM

## 2021-09-12 NOTE — Discharge Summary (Addendum)
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  History of present illness:  80 year old female with past medical history of diabetes mellitus type 2, asthma who was initially seen on 09/01/2021 and was diagnosed with peritonsillar abscess and treated with Rocephin, Augmentin and Decadron follow-up with PCP who referred her to ED.  CT of the head and neck showed early cellulitis and possible salivary duct stone, started empirically on vancomycin and continuing Augmentin.  She was supposed to follow-up with the ENT however she was unable to tolerate solids came back to the ED.  Repeat CT scan showed progressive infection, no airway compromise.  ENT was consulted patient started on Unasyn and Decadron.  Hospital Course:  Left submandibular inflammation/cellulitis -Seen by ENT, no indication for incision and drainage -Significantly improved after starting on Decadron taper -Is able to swallow and talk better today -Started on regular diet -Patient has significantly improved, ENT has signed off -We will discharge on Augmentin 1 tablet p.o. twice daily for 5 more days and prednisone taper for 4 days.     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 -Continue Xarelto   Hypokalemia -Potassium was 2.8 this morning -potassium replaced, repeat potassium is 3.5  Procedures:   Consultations: ENT  Discharge Exam: Vitals:   09/12/21 0815 09/12/21 1123  BP:  (!) 151/80  Pulse:  78  Resp:  16  Temp:  98.7 F (37.1 C)  SpO2: 97% 96%    General: Appears in no acute distress Cardiovascular: S1-S2, regular, no murmur auscultated Respiratory: Clear to auscultation bilaterally  Discharge Instructions   Discharge Instructions     Diet - low sodium heart healthy   Complete by: As directed    Increase activity slowly   Complete by: As directed       Allergies as of 09/12/2021       Reactions   Clonidine Hives        Medication List     STOP taking these medications    HYDROcodone-acetaminophen 5-325 MG tablet Commonly known as: Norco       TAKE these medications    albuterol 108 (90 Base) MCG/ACT inhaler Commonly known as: VENTOLIN HFA INHALE 2 PUFFS BY MOUTH EVERY 6 HOURS AS NEEDED FOR WHEEZE What changed: See the new instructions.   amLODipine 10 MG tablet Commonly known as: NORVASC TAKE 1 TABLET BY MOUTH EVERY DAY   amoxicillin-clavulanate 875-125 MG tablet Commonly known as: AUGMENTIN Take 1 tablet by mouth every 12 (twelve) hours. What changed: when to take this   benazepril 40 MG tablet Commonly known as: LOTENSIN TAKE 1 TABLET BY MOUTH EVERY DAY   celecoxib 200 MG capsule Commonly known as: CELEBREX TAKE 1 CAPSULE BY MOUTH EVERY DAY   fluticasone 50 MCG/ACT nasal spray Commonly known  as: FLONASE SPRAY 2 SPRAYS INTO EACH NOSTRIL EVERY DAY What changed: See the new instructions.   hydrochlorothiazide 12.5 MG tablet Commonly known as: HYDRODIURIL TAKE 1 TABLET (12.5 MG TOTAL) BY MOUTH AS NEEDED. FOR SWELLING What changed: See the new instructions.   metFORMIN 500 MG tablet Commonly known as: GLUCOPHAGE Take 1 tablet (500 mg total) by mouth at bedtime.    metoprolol succinate 50 MG 24 hr tablet Commonly known as: TOPROL-XL TAKE 1 TABLET BY MOUTH EVERY DAY   oxyCODONE 5 MG immediate release tablet Commonly known as: Oxy IR/ROXICODONE Take 1-2 tablets (5-10 mg total) by mouth every 6 (six) hours as needed for severe pain.   predniSONE 10 MG tablet Commonly known as: DELTASONE Prednisone 40 mg po daily x 1 day then Prednisone 30 mg po daily x 1 day then Prednisone 20 mg po daily x 1 day then Prednisone 10 mg daily x 1 day then stop...   rivaroxaban 20 MG Tabs tablet Commonly known as: XARELTO Take 1 tablet (20 mg total) by mouth daily with supper.   traMADol 50 MG tablet Commonly known as: ULTRAM TAKE 1 TABLET (50 MG TOTAL) BY MOUTH EVERY 12 (TWELVE) HOURS AS NEEDED. What changed: reasons to take this   Wixela Inhub 250-50 MCG/ACT Aepb Generic drug: fluticasone-salmeterol INHALE 1 PUFF BY MOUTH EVERY 12 HOURS What changed: See the new instructions.       Allergies  Allergen Reactions   Clonidine Hives    Follow-up Information     Suzanna Obey, MD Follow up.   Specialty: Otolaryngology Why: As needed, If symptoms worsen Contact information: [address] [place] [address] [phone]                  The results of significant diagnostics from this hospitalization (including imaging, microbiology, ancillary and laboratory) are listed below for reference.    Significant Diagnostic Studies: CT SOFT TISSUE NECK W CONTRAST  Result Date: 09/07/2021 CLINICAL DATA:  Left chin cellulitis EXAM: CT NECK WITH CONTRAST TECHNIQUE: Multidetector CT imaging of the neck was performed using the standard protocol following the bolus administration of intravenous contrast. CONTRAST:  OMNIPAQUE IOHEXOL 300 MG/ML  SOLN COMPARISON:  CT neck 09/05/2021 FINDINGS: Pharynx and larynx: The nasal cavity and nasopharynx are normal. The oral cavity and oropharynx are normal. Previously seen mucosal edema in the  hypopharynx and larynx has improved. Previously seen effacement of the left piriform sinus has improved the parapharyngeal spaces are clear. There is no evidence of abscess. The airway is patent. Salivary glands: The left submandibular gland is mildly enlarged compared to the right. No salivary gland stone is seen. The bilateral parotid and right submandibular glands are normal. Thyroid: Unremarkable. Lymph nodes: A prominent left level IB lymph node measuring up to 0.8 cm is not significantly changed in size. Scattered prominent lymph nodes at level II and in the posterior triangle are also not significantly changed. There is no new or progressive lymphadenopathy in the neck. Vascular: There is mild calcified atherosclerotic plaque in the bilateral internal carotid arteries. The vasculature is otherwise unremarkable. The jugular veins are widely patent. Limited intracranial: The imaged intracranial compartment is unremarkable. Visualized orbits: Bilateral lens implants are in place. The globes and orbits are otherwise unremarkable. Mastoids and visualized paranasal sinuses: The paranasal sinuses and mastoid air cells are clear. Skeleton: There is degenerative change in the cervical spine most advanced at C4-C5. There is no acute osseous abnormality or aggressive osseous lesion.  There is no visible canal hematoma. Upper chest: The imaged lung apices are clear. Other: Again seen is inflammatory change and scattered free fluid centered in the left submandibular region though also involving the left posterior triangle. Overall, this appears improved compared to the study from 09/05/2021. There is no organized or drainable fluid collection. IMPRESSION: 1. Overall slightly improved inflammatory changes centered in the left submandibular region also involving the posterior triangle likely reflecting improving cellulitis. The left submandibular gland is mildly enlarged compared to the right and may reflect sialoadenitis.  2. Previously seen edema in the hypopharynx/larynx has improved. The airway is patent. 3. No evidence of abscess. Electronically Signed   By: Lesia Hausen M.D.   On: 09/07/2021 16:15   CT Soft Tissue Neck W Contrast  Result Date: 09/05/2021 CLINICAL DATA:  Cellulitis with progressive soft tissue swelling. On antibiotics. EXAM: CT NECK WITH CONTRAST TECHNIQUE: Multidetector CT imaging of the neck was performed using the standard protocol following the bolus administration of intravenous contrast. CONTRAST:  87mL OMNIPAQUE IOHEXOL 300 MG/ML  SOLN COMPARISON:  CT neck 09/03/2021 FINDINGS: Pharynx and larynx: No pharyngeal mass or abscess. Airway intact. Epiglottis and larynx normal. Effacement of the left piriform sinus due to submucosal edema of the left pharynx. Salivary glands: There is stranding surrounding the left submandibular gland however the left submandibular gland is normal without stone mass or abscess. Right submandibular gland normal. Parotid normal bilaterally. Thyroid: Negative Lymph nodes: Scattered submandibular lymph nodes bilaterally left greater than right with mild progression since the prior study. These appear reactive due to cellulitis. Subcentimeter level 2 lymph nodes on the left also likely reactive. These are similar to the prior study. Vascular: Normal vascular enhancement. Limited intracranial: Negative Visualized orbits: No orbital mass or edema. Bilateral cataract extraction Mastoids and visualized paranasal sinuses: Negative Skeleton: No acute skeletal abnormality. No dental abscess identified. Upper chest: Lung apices clear bilaterally. Other: Progression of subcutaneous edema in the submandibular region. This is more prominent the left but does cross the midline to the right. No focal abscess. There is edema surrounding the left submandibular gland and also extending posterior to the sternocleidomastoid muscle on the left. Hyperenhancing submandibular lymph nodes left greater  than right have progressed in the interval. IMPRESSION: 1. Progression of cellulitis in the submandibular region, asymmetric to the left. Progression of reactive lymph nodes in the left submandibular region. No abscess 2. No airway compromise identified. There is mild edema effacing the left piriform sinus Electronically Signed   By: Marlan Palau M.D.   On: 09/05/2021 13:03   CT Soft Tissue Neck W Contrast  Result Date: 09/03/2021 CLINICAL DATA:  Neck mass, initial workup EXAM: CT NECK WITH CONTRAST TECHNIQUE: Multidetector CT imaging of the neck was performed using the standard protocol following the bolus administration of intravenous contrast. CONTRAST:  51mL OMNIPAQUE IOHEXOL 350 MG/ML SOLN COMPARISON:  None. FINDINGS: Pharynx and larynx: Unremarkable.  No mass or swelling. Salivary glands: Parotid glands are unremarkable. Thyroid: Normal. Lymph nodes: No enlarged or abnormal density nodes. Vascular: Major neck vessels are patent. Partially retropharyngeal course of the carotids. Mild calcified plaque at the common carotid bifurcations. Limited intracranial: No abnormal enhancement. Visualized orbits: No significant abnormality. Mastoids and visualized paranasal sinuses: No significant opacification. Skeleton: Cervical spine degenerative changes, greatest at C4-C5 and C5-C6. Upper chest: Unremarkable. Other: Infiltration of the fat in the left submandibular region. IMPRESSION: Inflammatory changes in the left submandibular region. May reflect cellulitis. The submandibular gland appears unremarkable but sialoadenitis is  another consideration. Electronically Signed   By: Guadlupe Spanish M.D.   On: 09/03/2021 12:10    Microbiology: Recent Results (from the past 240 hour(s))  Blood culture (routine x 2)     Status: None   Collection Time: 09/05/21 11:18 AM   Specimen: BLOOD  Result Value Ref Range Status   Specimen Description BLOOD RIGHT ANTECUBITAL  Final   Special Requests   Final    BOTTLES DRAWN  AEROBIC AND ANAEROBIC Blood Culture adequate volume   Culture   Final    NO GROWTH 5 DAYS Performed at [facility], [address] [place], [place] [address]    Report Status 09/10/2021 FINAL  Final  Resp Panel by RT-PCR (Flu A&B, Covid) Peripheral     Status: None   Collection Time: 09/05/21 11:18 AM   Specimen: Peripheral; Nasopharyngeal(NP) swabs in vial transport medium  Result Value Ref Range Status   SARS Coronavirus 2 by RT PCR NEGATIVE NEGATIVE Final    Comment: (NOTE) SARS-CoV-2 target nucleic acids are NOT DETECTED.  The SARS-CoV-2 RNA is generally detectable in upper respiratory specimens during the acute phase of infection. The lowest concentration of SARS-CoV-2 viral copies this assay can detect is 138 copies/mL. A negative result does not preclude SARS-Cov-2 infection and should not be used as the sole basis for treatment or other patient management decisions. A negative result may occur with  improper specimen collection/handling, submission of specimen other than nasopharyngeal swab, presence of viral mutation(s) within the areas targeted by this assay, and inadequate number of viral copies(<138 copies/mL). A negative result must be combined with clinical observations, patient history, and epidemiological information. The expected result is Negative.  Fact Sheet for Patients:  BloggerCourse.com  Fact Sheet for Healthcare Providers:  SeriousBroker.it  This test is no t yet approved or cleared by the [place] FDA and  has been authorized for detection and/or diagnosis of SARS-CoV-2 by FDA under an Emergency Use Authorization (EUA). This EUA will remain  in effect (meaning this test can be used) for the duration of the COVID-19 declaration under Section 564(b)(1) of the Act, 21 U.S.C.section 360bbb-3(b)(1), unless the authorization is terminated  or revoked sooner.       Influenza A by PCR NEGATIVE  NEGATIVE Final   Influenza B by PCR NEGATIVE NEGATIVE Final    Comment: (NOTE) The Xpert Xpress SARS-CoV-2/FLU/RSV plus assay is intended as an aid in the diagnosis of influenza from Nasopharyngeal swab specimens and should not be used as a sole basis for treatment. Nasal washings and aspirates are unacceptable for Xpert Xpress SARS-CoV-2/FLU/RSV testing.  Fact Sheet for Patients: BloggerCourse.com  Fact Sheet for Healthcare Providers: SeriousBroker.it  This test is not yet approved or cleared by the [place] FDA and has been authorized for detection and/or diagnosis of SARS-CoV-2 by FDA under an Emergency Use Authorization (EUA). This EUA will remain in effect (meaning this test can be used) for the duration of the COVID-19 declaration under Section 564(b)(1) of the Act, 21 U.S.C. section 360bbb-3(b)(1), unless the authorization is terminated or revoked.  Performed at [facility], [address], [place], [place] [address]   Blood culture (routine x 2)     Status: None   Collection Time: 09/05/21 12:29 PM   Specimen: BLOOD  Result Value Ref Range Status   Specimen Description BLOOD SITE NOT SPECIFIED  Final   Special Requests   Final    BOTTLES DRAWN AEROBIC AND ANAEROBIC Blood Culture results  may not be optimal due to an excessive volume of blood received in culture bottles   Culture   Final    NO GROWTH 5 DAYS Performed at [facility], [address] [place], [place] [address]    Report Status 09/10/2021 FINAL  Final     Labs: Basic Metabolic Panel: Recent Labs  Lab 09/06/21 0301 09/07/21 1009 09/08/21 0434 09/12/21 0314  NA 137 142 139 140  K 3.4* 3.2* 3.7 2.8*  CL 101 105 104 102  CO2 27 29 28 30   GLUCOSE 155* 97 92 119*  BUN 12 11 6* 18  CREATININE 0.61 0.63 0.71 1.02*  CALCIUM 8.8* 9.5 8.5* 8.8*   Liver Function Tests: No results for input(s): AST, ALT, ALKPHOS, BILITOT, PROT, ALBUMIN  in the last 168 hours. No results for input(s): LIPASE, AMYLASE in the last 168 hours. No results for input(s): AMMONIA in the last 168 hours. CBC: Recent Labs  Lab 09/06/21 0301  WBC 8.3  HGB 10.7*  HCT 32.4*  MCV 89.8  PLT 309     CBG: Recent Labs  Lab 09/11/21 1130 09/11/21 1617 09/11/21 2115 09/12/21 0809 09/12/21 1121  GLUCAP 249* 124* 158* 93 118*       Signed:  09/14/21 MD.  [facility] 09/12/2021, 2:17 PM

## 2021-09-17 ENCOUNTER — Telehealth: Payer: Self-pay

## 2021-09-17 DIAGNOSIS — M6281 Muscle weakness (generalized): Secondary | ICD-10-CM | POA: Diagnosis not present

## 2021-09-17 NOTE — Telephone Encounter (Signed)
Transition Care Management Unsuccessful Follow-up Telephone Call  Date of discharge and from where:  09/12/2021 / [facility]   Attempts:  1st Attempt  Reason for unsuccessful TCM follow-up call:  Patient states she is at the doctors office.  Request return call at a later day.   George Ina RN,BSN,CCM RN Case Manager [facility],   [phone]

## 2021-09-19 ENCOUNTER — Telehealth: Payer: Self-pay | Admitting: *Deleted

## 2021-09-19 ENCOUNTER — Other Ambulatory Visit: Payer: Self-pay | Admitting: Internal Medicine

## 2021-09-19 ENCOUNTER — Telehealth: Payer: Self-pay | Admitting: Internal Medicine

## 2021-09-19 DIAGNOSIS — S82851D Displaced trimalleolar fracture of right lower leg, subsequent encounter for closed fracture with routine healing: Secondary | ICD-10-CM

## 2021-09-19 MED ORDER — TRAMADOL HCL 50 MG PO TABS: 50.0000 mg | ORAL_TABLET | Freq: Two times a day (BID) | ORAL | 0 refills | Status: DC | PRN

## 2021-09-19 NOTE — Telephone Encounter (Signed)
Spoke with patient and she a prescription was not sent for pain medication.  She only got Amoxicillin.

## 2021-09-19 NOTE — Chronic Care Management (AMB) (Signed)
  Care Management   Note  09/19/2021 Name: Kristy [name] MRN: [id number] DOB: 1940-11-13  Kristy [name] is a 80 y.o. year old female who is a primary care patient of Philip Aspen, Limmie Patricia, MD and is actively engaged with the care management team. I reached out to Kristy [name] by phone today to assist with re-scheduling a follow up visit with the RN Case Manager  Follow up plan: Unsuccessful telephone outreach attempt made. A HIPAA compliant phone message was left for the patient providing contact information and requesting a return call.  The care management team will reach out to the patient again over the next 7 days.  If patient returns call to provider office, please advise to call Embedded Care Management Care Guide Misty Stanley  at [phone].  Gwenevere Ghazi  Care Guide, Embedded Care Coordination [facility] Management  Direct Dial: [phone]

## 2021-09-19 NOTE — Telephone Encounter (Signed)
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 Patient states tylenol is not working and she just wants something sent in for the pain. Patient is scheduled for Tuesday at 7:30 to see Dr.Hernandez.  Please send to [id number] [place], [place] - [address] Phone:  [phone]  Fax:  [phone]        Good callback number is [phone]    Please advise

## 2021-09-20 ENCOUNTER — Other Ambulatory Visit: Payer: Self-pay | Admitting: Internal Medicine

## 2021-09-20 DIAGNOSIS — E119 Type 2 diabetes mellitus without complications: Secondary | ICD-10-CM

## 2021-09-22 NOTE — Chronic Care Management (AMB) (Signed)
  Care Management   Note  09/22/2021 Name: Kristy [name] MRN: [id number] DOB: [birth year]  Kristy [name] is a 80 y.o. year old female who is a primary care patient of Philip Aspen, Limmie Patricia, MD and is actively engaged with the care management team. I reached out to Kristy [name] by phone today to assist with re-scheduling a follow up visit with the RN Case Manager  Follow up plan: Telephone appointment with care management team member scheduled for:09/26/21  [facility] Guide, Embedded Care Coordination [facility]  Care Management  Direct Dial: [phone]

## 2021-09-23 ENCOUNTER — Ambulatory Visit (INDEPENDENT_AMBULATORY_CARE_PROVIDER_SITE_OTHER): Payer: Medicare Other | Admitting: Internal Medicine

## 2021-09-23 ENCOUNTER — Encounter: Payer: Self-pay | Admitting: Internal Medicine

## 2021-09-23 VITALS — BP 160/80 | HR 73 | Temp 97.8°F | Wt 156.0 lb

## 2021-09-23 DIAGNOSIS — L03221 Cellulitis of neck: Secondary | ICD-10-CM

## 2021-09-23 MED ORDER — PREDNISONE 10 MG (21) PO TBPK: ORAL_TABLET | ORAL | 0 refills | Status: DC

## 2021-09-23 MED ORDER — AMOXICILLIN-POT CLAVULANATE 875-125 MG PO TABS: 1.0000 | ORAL_TABLET | Freq: Two times a day (BID) | ORAL | 0 refills | Status: AC

## 2021-09-23 NOTE — Progress Notes (Signed)
Acute office Visit     This visit occurred during the SARS-CoV-2 public health emergency.  Safety protocols were in place, including screening questions prior to the visit, additional usage of staff PPE, and extensive cleaning of exam room while observing appropriate contact time as indicated for disinfecting solutions.    CC/Reason for Visit: Cellulitis of the neck has returned  HPI: Kristy [name] is a 80 y.o. female who is coming in today for the above mentioned reasons.  She was hospitalized 11/18-11/25/2022 for cellulitis of the neck.  During that admission CT of the neck did not show any discernible abscess or fluid collection.  She was seen by ENT.  She was managed with IV antibiotics and steroids with improvement.  She was discharged home.  She finished her antibiotics yesterday and only got 3 days of steroids on discharge.  She feels like the pain and swelling is returning.  Her appointment with ENT is not until December 22.  Past Medical/Surgical History: Past Medical History:  Diagnosis Date   Allergic rhinitis, seasonal    Arthritis    Asthma    bronchial with weather change   Diabetes mellitus without complication ([place])    type 2    Dysrhythmia    "Skipping a beat"   GERD (gastroesophageal reflux disease)    History of colon polyps    Hypertension    Pneumonia    Rotator cuff tear, right     Past Surgical History:  Procedure Laterality Date   BACK SURGERY     x3  ruptured disc    CHOLECYSTECTOMY OPEN  AGE 44   EYE SURGERY     bil cataracts   HERNIA REPAIR     INGUINAL HERNIA REPAIR Right AGE 70s   KNEE ARTHROSCOPY Left 2014   LUMBAR [place]  x2  LAST DATE EARLY 2000s   ORIF ANKLE FRACTURE Right 06/24/2018   Procedure: OPEN REDUCTION INTERNAL FIXATION (ORIF) RIGHT ANKLE FRACTURE;  Surgeon: Mcarthur Rossetti, MD;  Location: [facility];  Service: Orthopedics;  Laterality: Right;   ROTATOR CUFF REPAIR Left 01/2015   TONSILLECTOMY  AGE 15   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 Her smoking use included cigarettes. She has never used smokeless tobacco. She reports current alcohol use of about 3.0 - 4.0 standard drinks per week. She reports that she does not use drugs.  Allergies: Allergies  Allergen Reactions   Clonidine Hives    Family History:  Family History  Problem Relation Age of Onset   Colon cancer Neg Hx    Esophageal cancer Neg Hx    Rectal cancer Neg Hx    Stomach cancer Neg Hx      Current Outpatient Medications:    albuterol (VENTOLIN HFA) 108 (90 Base) MCG/ACT inhaler, INHALE 2 PUFFS BY MOUTH EVERY 6 HOURS AS NEEDED FOR WHEEZE (Patient taking differently: Inhale 2 puffs into the lungs every 6 (six) hours as needed for shortness of breath or wheezing.), Disp: 6.7 g, Rfl: 1   amLODipine (NORVASC) 10 MG tablet, TAKE 1 TABLET BY MOUTH EVERY DAY, Disp: 90 tablet, Rfl: 1   amoxicillin-clavulanate (AUGMENTIN) 875-125 MG tablet, Take 1 tablet by mouth 2 (two)  times daily for 7 days., Disp: 14 tablet, Rfl: 0   benazepril (LOTENSIN) 40 MG tablet, TAKE 1 TABLET BY MOUTH EVERY DAY, Disp: 90 tablet, Rfl: 1   celecoxib (CELEBREX) 200 MG capsule, TAKE 1 CAPSULE BY MOUTH EVERY DAY, Disp: 90 capsule, Rfl: 0   fluticasone (FLONASE) 50 MCG/ACT nasal spray, SPRAY 2 SPRAYS INTO EACH NOSTRIL EVERY DAY (Patient taking differently: Place 2 sprays into both nostrils daily as needed for allergies.), Disp: 48 mL, Rfl: 0   hydrochlorothiazide (HYDRODIURIL) 12.5 MG tablet, TAKE 1 TABLET (12.5 MG TOTAL) BY MOUTH AS NEEDED. FOR SWELLING (Patient taking differently: Take 12.5 mg by mouth daily.), Disp: 90 tablet, Rfl: 3   metFORMIN (GLUCOPHAGE) 500 MG tablet, TAKE 1 TABLET BY MOUTH 2 TIMES DAILY WITH A  MEAL., Disp: 180 tablet, Rfl: 1   metoprolol succinate (TOPROL-XL) 50 MG 24 hr tablet, TAKE 1 TABLET BY MOUTH EVERY DAY, Disp: 90 tablet, Rfl: 3   predniSONE (STERAPRED UNI-PAK 21 TAB) 10 MG (21) TBPK tablet, Take as directed, Disp: 21 tablet, Rfl: 0   rivaroxaban (XARELTO) 20 MG TABS tablet, Take 1 tablet (20 mg total) by mouth daily with supper., Disp: 28 tablet, Rfl: 0   traMADol (ULTRAM) 50 MG tablet, Take 1 tablet (50 mg total) by mouth every 12 (twelve) hours as needed (PAIN.)., Disp: 30 tablet, Rfl: 0   WIXELA INHUB 250-50 MCG/DOSE AEPB, INHALE 1 PUFF BY MOUTH EVERY 12 HOURS (Patient taking differently: Inhale 1 puff into the lungs 2 (two) times daily as needed (congestion/difficulty breathing.).), Disp: 180 each, Rfl: 1  Review of Systems:  Constitutional: Denies fever, chills, diaphoresis, appetite change and fatigue.  HEENT: Denies photophobia, eye pain, redness, hearing loss, ear pain, congestion, rhinorrhea, sneezing, mouth sores, neck stiffness and tinnitus.   Respiratory: Denies SOB, DOE, cough, chest tightness,  and wheezing.   Cardiovascular: Denies chest pain, palpitations and leg swelling.  Gastrointestinal: Denies nausea, vomiting, abdominal pain, diarrhea, constipation, blood in stool and abdominal distention.  Genitourinary: Denies dysuria, urgency, frequency, hematuria, flank pain and difficulty urinating.  Endocrine: Denies: hot or cold intolerance, sweats, changes in hair or nails, polyuria, polydipsia. Musculoskeletal: Denies myalgias, back pain, joint swelling, arthralgias and gait problem.  Skin: Denies pallor, rash and wound.  Neurological: Denies dizziness, seizures, syncope, weakness, light-headedness, numbness and headaches.  Hematological: Denies adenopathy. Easy bruising, personal or family bleeding history  Psychiatric/Behavioral: Denies suicidal ideation, mood changes, confusion, nervousness, sleep disturbance and agitation    Physical Exam: Vitals:    09/23/21 0737  BP: (!) 160/80  Pulse: 73  Temp: 97.8 F (36.6 C)  TempSrc: Oral  SpO2: 95%  Weight: 156 lb (70.8 kg)    Body mass index is 29.48 kg/m.   Constitutional: NAD, calm, comfortable Eyes: PERRL, lids and conjunctivae normal ENMT: Mucous membranes are moist.  Neck: She has an induration to her left submandibular area, no discrete mass as before. Respiratory: clear to auscultation bilaterally, no wheezing, no crackles. Normal respiratory effort. No accessory muscle use.  Psychiatric: Normal judgment and insight. Alert and oriented x 3. Normal mood.    Impression and Plan:  Cellulitis of neck  - Plan: amoxicillin-clavulanate (AUGMENTIN) 875-125 MG tablet, predniSONE (STERAPRED UNI-PAK 21 TAB) 10 MG (21) TBPK tablet -I will renew her antibiotics for an additional 7 days as well as send in a prednisone taper. -I will see if there is any way she can get into see ENT sooner, but I do not know if they will be able to  do anything acutely. -Have advised use of probiotics given multiple recent courses of antibiotics. -She knows to go to the ED for significant trouble swallowing or shortness of breath.  Time spent: 31 minutes reviewing chart including hospital admission, interviewing and examining patient and formulating plan of care.     Chaya Jan, MD [facility]

## 2021-09-26 ENCOUNTER — Telehealth: Payer: Medicare Other

## 2021-09-26 ENCOUNTER — Telehealth: Payer: Self-pay

## 2021-09-26 NOTE — Telephone Encounter (Signed)
Transition Care Management Unsuccessful Follow-up Telephone Call  Date of discharge and from where:  09/12/2021  [facility]  Attempts:  1st Attempt  Reason for unsuccessful TCM follow-up call:  No answer/busy Rowe Pavy, RN, BSN, CEN [facility] Coordinator [phone]

## 2021-09-30 ENCOUNTER — Other Ambulatory Visit: Payer: Self-pay | Admitting: Internal Medicine

## 2021-10-01 ENCOUNTER — Telehealth: Payer: Self-pay | Admitting: Internal Medicine

## 2021-10-01 NOTE — Telephone Encounter (Signed)
Spoke with patient.  Patient's appointment is with [facility] - [address] [phone]

## 2021-10-01 NOTE — Telephone Encounter (Signed)
Pt seen dr Ardyth Harps on 09-23-2021 for glandular infection in her neck patient finished abx and the infection  and pain is back. Pt does have an appt to see specialist of 10-09-2021 and would like to know the next step

## 2021-10-02 NOTE — Telephone Encounter (Signed)
Left message on machine for referral coordinator at [facility].

## 2021-10-06 DIAGNOSIS — M150 Primary generalized (osteo)arthritis: Secondary | ICD-10-CM | POA: Diagnosis not present

## 2021-10-06 DIAGNOSIS — E663 Overweight: Secondary | ICD-10-CM | POA: Diagnosis not present

## 2021-10-06 DIAGNOSIS — M154 Erosive (osteo)arthritis: Secondary | ICD-10-CM | POA: Diagnosis not present

## 2021-10-06 DIAGNOSIS — Z79899 Other long term (current) drug therapy: Secondary | ICD-10-CM | POA: Diagnosis not present

## 2021-10-06 DIAGNOSIS — K111 Hypertrophy of salivary gland: Secondary | ICD-10-CM | POA: Diagnosis not present

## 2021-10-06 DIAGNOSIS — R5383 Other fatigue: Secondary | ICD-10-CM | POA: Diagnosis not present

## 2021-10-06 DIAGNOSIS — Z6828 Body mass index (BMI) 28.0-28.9, adult: Secondary | ICD-10-CM | POA: Diagnosis not present

## 2021-10-09 DIAGNOSIS — J3489 Other specified disorders of nose and nasal sinuses: Secondary | ICD-10-CM | POA: Diagnosis not present

## 2021-10-09 DIAGNOSIS — R490 Dysphonia: Secondary | ICD-10-CM | POA: Diagnosis not present

## 2021-10-09 DIAGNOSIS — K1123 Chronic sialoadenitis: Secondary | ICD-10-CM | POA: Diagnosis not present

## 2021-10-09 DIAGNOSIS — J384 Edema of larynx: Secondary | ICD-10-CM | POA: Diagnosis not present

## 2021-10-09 DIAGNOSIS — Z87891 Personal history of nicotine dependence: Secondary | ICD-10-CM | POA: Diagnosis not present

## 2021-10-09 DIAGNOSIS — M2669 Other specified disorders of temporomandibular joint: Secondary | ICD-10-CM | POA: Diagnosis not present

## 2021-10-10 ENCOUNTER — Telehealth: Payer: Medicare Other

## 2021-10-10 DIAGNOSIS — K1120 Sialoadenitis, unspecified: Secondary | ICD-10-CM | POA: Diagnosis not present

## 2021-10-10 DIAGNOSIS — R609 Edema, unspecified: Secondary | ICD-10-CM | POA: Diagnosis not present

## 2021-10-10 DIAGNOSIS — M47812 Spondylosis without myelopathy or radiculopathy, cervical region: Secondary | ICD-10-CM | POA: Diagnosis not present

## 2021-10-10 DIAGNOSIS — J392 Other diseases of pharynx: Secondary | ICD-10-CM | POA: Diagnosis not present

## 2021-10-10 DIAGNOSIS — J384 Edema of larynx: Secondary | ICD-10-CM | POA: Diagnosis not present

## 2021-10-10 DIAGNOSIS — K1123 Chronic sialoadenitis: Secondary | ICD-10-CM | POA: Diagnosis not present

## 2021-10-14 ENCOUNTER — Other Ambulatory Visit: Payer: Self-pay | Admitting: Otolaryngology

## 2021-10-14 DIAGNOSIS — K1123 Chronic sialoadenitis: Secondary | ICD-10-CM

## 2021-10-20 ENCOUNTER — Other Ambulatory Visit: Payer: Self-pay | Admitting: Internal Medicine

## 2021-10-22 ENCOUNTER — Ambulatory Visit
Admission: RE | Admit: 2021-10-22 | Discharge: 2021-10-22 | Disposition: A | Discharge status: Home / Self Care | Payer: Medicare Other | Source: Ambulatory Visit | Attending: Otolaryngology | Admitting: Otolaryngology

## 2021-10-22 ENCOUNTER — Other Ambulatory Visit: Payer: Self-pay | Admitting: Otolaryngology

## 2021-10-22 DIAGNOSIS — K1123 Chronic sialoadenitis: Secondary | ICD-10-CM

## 2021-10-24 ENCOUNTER — Other Ambulatory Visit: Payer: Medicare Other

## 2021-10-28 ENCOUNTER — Ambulatory Visit
Admission: RE | Admit: 2021-10-28 | Discharge: 2021-10-28 | Disposition: A | Discharge status: Home / Self Care | Payer: Medicare Other | Source: Ambulatory Visit | Attending: Otolaryngology | Admitting: Otolaryngology

## 2021-10-28 DIAGNOSIS — L0390 Cellulitis, unspecified: Secondary | ICD-10-CM | POA: Diagnosis not present

## 2021-10-28 DIAGNOSIS — J392 Other diseases of pharynx: Secondary | ICD-10-CM | POA: Diagnosis not present

## 2021-10-28 DIAGNOSIS — K1120 Sialoadenitis, unspecified: Secondary | ICD-10-CM | POA: Diagnosis not present

## 2021-10-28 DIAGNOSIS — R590 Localized enlarged lymph nodes: Secondary | ICD-10-CM | POA: Diagnosis not present

## 2021-10-28 MED ORDER — IOPAMIDOL (ISOVUE-300) INJECTION 61%
75.0000 mL | Freq: Once | INTRAVENOUS | Status: AC | PRN
  Administered 2021-10-28: 75 mL via INTRAVENOUS

## 2021-10-31 DIAGNOSIS — R1310 Dysphagia, unspecified: Secondary | ICD-10-CM | POA: Diagnosis not present

## 2021-10-31 DIAGNOSIS — M26609 Unspecified temporomandibular joint disorder, unspecified side: Secondary | ICD-10-CM | POA: Diagnosis not present

## 2021-10-31 DIAGNOSIS — K1123 Chronic sialoadenitis: Secondary | ICD-10-CM | POA: Diagnosis not present

## 2021-11-05 DIAGNOSIS — M5416 Radiculopathy, lumbar region: Secondary | ICD-10-CM | POA: Diagnosis not present

## 2021-11-05 DIAGNOSIS — M545 Low back pain, unspecified: Secondary | ICD-10-CM | POA: Diagnosis not present

## 2021-11-13 ENCOUNTER — Encounter: Payer: Self-pay | Admitting: Cardiovascular Disease

## 2021-11-13 ENCOUNTER — Other Ambulatory Visit: Payer: Self-pay

## 2021-11-13 ENCOUNTER — Ambulatory Visit (INDEPENDENT_AMBULATORY_CARE_PROVIDER_SITE_OTHER): Payer: Medicare Other | Admitting: Cardiovascular Disease

## 2021-11-13 DIAGNOSIS — I351 Nonrheumatic aortic (valve) insufficiency: Secondary | ICD-10-CM | POA: Diagnosis not present

## 2021-11-13 DIAGNOSIS — R0609 Other forms of dyspnea: Secondary | ICD-10-CM

## 2021-11-13 DIAGNOSIS — I10 Essential (primary) hypertension: Secondary | ICD-10-CM

## 2021-11-13 DIAGNOSIS — I340 Nonrheumatic mitral (valve) insufficiency: Secondary | ICD-10-CM

## 2021-11-13 DIAGNOSIS — E119 Type 2 diabetes mellitus without complications: Secondary | ICD-10-CM

## 2021-11-13 DIAGNOSIS — I429 Cardiomyopathy, unspecified: Secondary | ICD-10-CM | POA: Diagnosis not present

## 2021-11-13 NOTE — Progress Notes (Signed)
Cardiology Office Note    Date:  11/13/2021   ID:  Kristy [name], DOB 1941-09-18, MRN [id number]  PCP:  Isaac Bliss, Rayford Halsted, MD  Cardiologist:  Shelva Majestic, MD   13 F/U cardiology evaluation initially  referred through the courtesy of Dr. Isaac Bliss for preoperative evaluation prior to undergoing elective knee replacement surgery.  History of Present Illness:  Kristy [name] is a 81 y.o. female who is followed by Dr. Isaac Bliss for primary care.  She had previously undergone right knee arthroscopy and apparently is scheduled to undergo right total knee replacement electively on June 7.  She has had significant difficulty with walking and has been using a cane.  Previously in the past she had enjoyed Zumba as well as bowling.  She was seen yesterday by Dr. Isaac Bliss for preoperative clearance.  During her evaluation she was noticed to have frequent skipping beats and palpitations.  An ECG showed sinus rhythm with frequent PACs with a ventricular rate around 85 without acute ST-T changes.  She has a history of essential hypertension.  She was worked into my schedule today to be seen for preoperative cardiology clearance prior to her elective surgery.  A 2D echo Doppler study had been scheduled.  I saw the patient for initial cardiology evaluation on Mar 07, 2020.  At that time, she denied any chest pain or chest tightness and denied any significant shortness of breath.  She has a history of hypertension for greater than 15 years and was on a regimen consisting of amlodipine 10 mg, benazepril 40 mg and was on hydrochlorothiazide 25 mg daily.   Laboratory drawn the day before her evaluation with me had shown a hemoglobin A1c at 6.6 and Dr. Isaac Bliss had called in a prescription for metformin to initiate 500 mg twice a day effective today.  In addition she was found to be hypokalemic with a potassium of 3.1 and supplemental potassium 40 mEq daily for 3 days was ordered.   She was unaware of any  heart rate irregularity.  I reviewed the EKG from Dr. Cresenciano Lick evaluation which showed every fourth beat being a PAC.  Ventricular rate was 85 bpm.  She denies PND orthopnea.  She denies presyncope or syncope.  She is unaware of any significant cardiac murmur.  During my evaluation, her blood pressure was slightly elevated at 144/70 and her ECG showed atrial trigeminy.  At that time I recommended she decrease HCTZ to 12.5 mg and started her on metoprolol succinate 25 mg daily with plan titration to 50 mg.  She underwent an echo Doppler study on Mar 12, 2020.  This revealed reduced LV function with an EF of 40 to 45% in a global hypokinetic pattern.  Left-ventricular internal cavity size is mildly dilated.  There is grade 1 diastolic dysfunction.  She had mildly elevated pulmonic artery systolic pressure.  There was mild left atrial dilatation.  There is evidence for a tricuspid aortic valve with mild aortic insufficiency.  There is mild mitral regurgitation.  She underwent follow-up laboratory which revealed improvement in her hypokalemia with potassium at 3.9.  With her reduced LV function, a Lexiscan Myoview study was recommended which was done on March 26, 2020 and was low risk.  She was seen on April 17, 2020 by Jory Sims, DNP and at that time, her blood pressure was improved.  At that time, her knee discomfort was improved and she wished to defer surgery.  I last saw  her on October 03, 2020.  Over the prior 6 months she was on  amlodipine 10 mg, benazepril 40 mg, HCTZ 12.5 mg in addition to metoprolol succinate 50 mg daily.  She has experienced ankle swelling.  She has had some recurrent knee discomfort and is now planning to undergo knee surgery with Dr. Sandie Ano.  During that evaluation, she was felt to be cardiovascularly stable patient was given clearance to undergo her knee replacement surgery.  Present, she has been feeling well but she was hospitalized around  Thanksgiving 2022 with left submandibular inflammation/cellulitis.  She was seen by ENT.  She required antibiotics as well as Decadron taper.  She was discharged and then readmitted.  She is completed her course of therapy with resolution of symptomatology.  Presently she admits to being more short of breath with walking.  She denies any exertional chest tightness.  She admits to being fatigued.  She sleeps by herself she is unaware of snoring.  She does have frequent awakenings.  She continues to be on amlodipine 10 mg, benazepril 40 mg, HCTZ 12.5 mg, and metoprolol succinate 50 mg daily.  She is on metformin 500 mg for diabetes and is on as needed albuterol and Wixela in hub inhaler.  She presents for evaluation.    Past Medical History:  Diagnosis Date   Allergic rhinitis, seasonal    Arthritis    Asthma    bronchial with weather change   Diabetes mellitus without complication ([place])    type 2    Dysrhythmia    "Skipping a beat"   GERD (gastroesophageal reflux disease)    History of colon polyps    Hypertension    Pneumonia    Rotator cuff tear, right     Past Surgical History:  Procedure Laterality Date   BACK SURGERY     x3  ruptured disc    CHOLECYSTECTOMY OPEN  AGE 19   EYE SURGERY     bil cataracts   HERNIA REPAIR     INGUINAL HERNIA REPAIR Right AGE 87s   KNEE ARTHROSCOPY Left 2014   LUMBAR [place]  x2  LAST DATE EARLY 2000s   ORIF ANKLE FRACTURE Right 06/24/2018   Procedure: OPEN REDUCTION INTERNAL FIXATION (ORIF) RIGHT ANKLE FRACTURE;  Surgeon: Mcarthur Rossetti, MD;  Location: [facility];  Service: Orthopedics;  Laterality: Right;   ROTATOR CUFF REPAIR Left 01/2015   TONSILLECTOMY  AGE 29   TOTAL KNEE ARTHROPLASTY Right 12/09/2020   Procedure: TOTAL KNEE ARTHROPLASTY;  Surgeon: Gaynelle Arabian, MD;  Location: [facility];  Service: Orthopedics;  Laterality: Right;  45mn   VAGINAL HYSTERECTOMY  AGE 63   WITH BSO    Current Medications: Outpatient Medications Prior  to Visit  Medication Sig Dispense Refill   albuterol (VENTOLIN HFA) 108 (90 Base) MCG/ACT inhaler INHALE 2 PUFFS BY MOUTH EVERY 6 HOURS AS NEEDED FOR WHEEZE (Patient taking differently: Inhale 2 puffs into the lungs every 6 (six) hours as needed for shortness of breath or wheezing.) 6.7 g 1   amLODipine (NORVASC) 10 MG tablet TAKE 1 TABLET BY MOUTH EVERY DAY 90 tablet 1   benazepril (LOTENSIN) 40 MG tablet TAKE 1 TABLET BY MOUTH EVERY DAY 90 tablet 1   celecoxib (CELEBREX) 200 MG capsule TAKE 1 CAPSULE BY MOUTH EVERY DAY 90 capsule 0   hydrochlorothiazide (HYDRODIURIL) 12.5 MG tablet TAKE 1 TABLET (12.5 MG TOTAL) BY MOUTH AS NEEDED. FOR SWELLING (Patient taking differently: Take 12.5 mg by mouth daily.) 90  tablet 3   metFORMIN (GLUCOPHAGE) 500 MG tablet TAKE 1 TABLET BY MOUTH 2 TIMES DAILY WITH A MEAL. 180 tablet 1   metoprolol succinate (TOPROL-XL) 50 MG 24 hr tablet TAKE 1 TABLET BY MOUTH EVERY DAY 90 tablet 3   WIXELA INHUB 250-50 MCG/DOSE AEPB INHALE 1 PUFF BY MOUTH EVERY 12 HOURS (Patient taking differently: Inhale 1 puff into the lungs 2 (two) times daily as needed (congestion/difficulty breathing.).) 180 each 1   fluticasone (FLONASE) 50 MCG/ACT nasal spray SPRAY 2 SPRAYS INTO EACH NOSTRIL EVERY DAY (Patient taking differently: Place 2 sprays into both nostrils daily as needed for allergies.) 48 mL 0   predniSONE (STERAPRED UNI-PAK 21 TAB) 10 MG (21) TBPK tablet Take as directed 21 tablet 0   rivaroxaban (XARELTO) 20 MG TABS tablet Take 1 tablet (20 mg total) by mouth daily with supper. 28 tablet 0   traMADol (ULTRAM) 50 MG tablet Take 1 tablet (50 mg total) by mouth every 12 (twelve) hours as needed (PAIN.). 30 tablet 0   No facility-administered medications prior to visit.     Allergies:   Clonidine   Social History   Socioeconomic History   Marital status: Widowed    Spouse name: Not on file   Number of children: Not on file   Years of education: Not on file   Highest education  level: Not on file  Occupational History   Occupation: retired  Tobacco Use   Smoking status: Former    Years: 10.00    Types: Cigarettes    Quit date: 10/19/1988    Years since quitting: 33.0   Smokeless tobacco: Never  Vaping Use   Vaping Use: Never used  Substance and Sexual Activity   Alcohol use: Yes    Alcohol/week: 3.0 - 4.0 standard drinks    Types: 3 - 4 Glasses of wine per week    Comment: 2-3 times a week   Drug use: Never   Sexual activity: Not Currently  Other Topics Concern   Not on file  Social History Narrative   Not on file   Social Determinants of Health   Financial Resource Strain: Low Risk    Difficulty of Paying Living Expenses: Not hard at all  Food Insecurity: No Food Insecurity   Worried About Charity fundraiser in the Last Year: Never true   Ran Out of Food in the Last Year: Never true  Transportation Needs: No Transportation Needs   Lack of Transportation (Medical): No   Lack of Transportation (Non-Medical): No  Physical Activity: Not on file  Stress: No Stress Concern Present   Feeling of Stress : Not at all  Social Connections: Not on file    Additional social history: She is widowed for 1 year.  She has 4 children, 12 grandchildren and 10 great-grandchildren.  She is retired.  Previously she was a Librarian, academic for [employer].  She has associated college degree.  Remotely she had smoked for 5 years.  She quit in 2001.  She does drink occasional wine.  Used to be very active but this has been limited due to her progressive knee pain  Family History: Family history is notable that her mother died at age 59 with a stroke.  Father died at age 66.  Maternal grandmother died at 5 with natural causes.  She has 4 brothers and 3 sisters.  ROS General: Negative; No fevers, chills, or night sweats;  HEENT: Negative; No changes in vision or hearing, sinus congestion,  difficulty swallowing Pulmonary: History of allergy induced bronchial  asthma Cardiovascular: See HPI GI: Negative; No nausea, vomiting, diarrhea, or abdominal pain GU: Negative; No dysuria, hematuria, or difficulty voiding Musculoskeletal: Positive for right knee discomfort Hematologic/Oncology: Negative; no easy bruising, bleeding Endocrine: Positive for diabetes Neuro: Negative; no changes in balance, headaches Skin: Negative; No rashes or skin lesions Psychiatric: Negative; No behavioral problems, depression Sleep: Nonrestorative sleep; unaware of snoring, nocturia 3 times per night, no bruxism, restless legs, hypnogognic hallucinations, no cataplexy Other comprehensive 14 point system review is negative.   PHYSICAL EXAM:   VS:  BP 126/78    Pulse 77    Ht 5' (1.524 m)    Wt 156 lb 12.8 oz (71.1 kg)    SpO2 98%    BMI 30.62 kg/m     Repeat blood pressure by me was 124/70  Wt Readings from Last 3 Encounters:  11/13/21 156 lb 12.8 oz (71.1 kg)  09/23/21 156 lb (70.8 kg)  09/03/21 158 lb (71.7 kg)    General: Alert, oriented, no distress.  Skin: normal turgor, no rashes, warm and dry HEENT: Normocephalic, atraumatic. Pupils equal round and reactive to light; sclera anicteric; extraocular muscles intact;  Nose without nasal septal hypertrophy Mouth/Parynx benign; Mallinpatti scale 3 Neck: No JVD, no carotid bruits; normal carotid upstroke Lungs: clear to ausculatation and percussion; no wheezing or rales Chest wall: without tenderness to palpitation Heart: PMI not displaced, RRR, s1 s2 normal, 1/6 systolic murmur, no diastolic murmur, no rubs, gallops, thrills, or heaves Abdomen: soft, nontender; no hepatosplenomehaly, BS+; abdominal aorta nontender and not dilated by palpation. Back: no CVA tenderness Pulses 2+ Musculoskeletal: full range of motion, normal strength, no joint deformities Extremities: no clubbing cyanosis or edema, Homan's sign negative  Neurologic: grossly nonfocal; Cranial nerves grossly wnl Psychologic: Normal mood and  affect   Studies/Labs Reviewed:   November 13, 2021 ECG (independently read by me):  NSR at 77, nonspecific T wave  October 08, 2020 ECG (independently read by me): NSR at 69; baseline wander; normal intervals  May 2021 ECG (independently read by me): Sinus rhythm at 78 bpm with PACs and atrial trigeminal rhythm.  QTc interval '3 7 3 ' ms.  Parable 146 ms  Mar 07, 2020 ECG (independently read by me): Sinus rhythm with PACs and atrial trigeminal rhythm pattern.  QTc interval 446 ms  Recent Labs: BMP Latest Ref Rng & Units 09/12/2021 09/12/2021 09/08/2021  Glucose 70 - 99 mg/dL 129(H) 119(H) 92  BUN 8 - 23 mg/dL 16 18 6(L)  Creatinine 0.44 - 1.00 mg/dL 0.89 1.02(H) 0.71  BUN/Creat Ratio 12 - 28 - - -  Sodium 135 - 145 mmol/L 138 140 139  Potassium 3.5 - 5.1 mmol/L 3.5 2.8(L) 3.7  Chloride 98 - 111 mmol/L 103 102 104  CO2 22 - 32 mmol/L '27 30 28  ' Calcium 8.9 - 10.3 mg/dL 8.9 8.8(L) 8.5(L)     Hepatic Function Latest Ref Rng & Units 11/27/2020 03/15/2020 03/06/2020  Total Protein 6.5 - 8.1 g/dL 8.0 7.6 8.0  Albumin 3.5 - 5.0 g/dL 4.6 4.2 4.6  AST 15 - 41 U/L '16 19 15  ' ALT 0 - 44 U/L '12 16 12  ' Alk Phosphatase 38 - 126 U/L 53 54 61  Total Bilirubin 0.3 - 1.2 mg/dL 0.6 0.5 0.3  Bilirubin, Direct 0.0 - 0.3 mg/dL - - -    CBC Latest Ref Rng & Units 09/06/2021 09/05/2021 09/03/2021  WBC 4.0 - 10.5 K/uL 8.3 12.0(H) 15.2(H)  Hemoglobin 12.0 - 15.0 g/dL 10.7(L) 12.0 13.5  Hematocrit 36.0 - 46.0 % 32.4(L) 37.6 41.1  Platelets 150 - 400 K/uL 309 384 361   Lab Results  Component Value Date   MCV 89.8 09/06/2021   MCV 93.3 09/05/2021   MCV 91.3 09/03/2021   Lab Results  Component Value Date   TSH 2.37 03/06/2020   Lab Results  Component Value Date   HGBA1C 5.8 (A) 09/03/2021     BNP No results found for: BNP  ProBNP No results found for: PROBNP   Lipid Panel     Component Value Date/Time   CHOL 197 03/14/2019 0810   TRIG 98.0 03/14/2019 0810   TRIG 82 09/30/2006 0943    HDL 60.90 03/14/2019 0810   CHOLHDL 3 03/14/2019 0810   VLDL 19.6 03/14/2019 0810   LDLCALC 116 (H) 03/14/2019 0810   LDLDIRECT 118.8 03/30/2013 1145     RADIOLOGY: CT SOFT TISSUE NECK W CONTRAST  Result Date: 10/28/2021 CLINICAL DATA:  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  Cellulitis. EXAM: CT NECK WITH CONTRAST TECHNIQUE: Multidetector CT imaging of the neck was performed using the standard protocol following the bolus administration of intravenous contrast. CONTRAST:  83m ISOVUE-300 IOPAMIDOL (ISOVUE-300) INJECTION 61% COMPARISON:  10/10/2021 FINDINGS: Pharynx and larynx: Submandibular and submental stranding with nodal enlargement and platysma thickening which is similar to priors. Gas in the low left oral cavity, presumably in the deep mucosal recess Salivary glands: No acute inflammation seen in the salivary glands. The anterior floor of mouth is obscured by streak artifact from dental amalgam. Thyroid: Normal. Lymph nodes: Enlarged left more than right submandibular and left upper jugular chain lymph nodes with adjacent fat stranding. The left jugulodigastric node measures up to 17 mm in length. Vascular: Negative. Limited intracranial: Negative. Visualized orbits: Negative. Mastoids and visualized paranasal sinuses: Clear. Skeleton: No acute or aggressive process. Upper chest: Negative IMPRESSION: Persisting adenopathy and fat inflammation in the submandibular and left upper jugular regions. No remaining submandibular gland inflammation noted today. Electronically Signed   By: JJorje GuildM.D.   On: 10/28/2021 13:20     Additional studies/ records that were reviewed today include:  I reviewed the patient's records from Dr. HIsaac Bliss  ECHO 03/12/2020 IMPRESSIONS   1. Mild to moderate LV dysfunction; grade 1 diastolic dysfunction; mild  LVE; mild AI and MR; mild LAE.   2. Left ventricular ejection fraction, by estimation, is 40 to 45%. The  left ventricle has mildly decreased  function. The left ventricle  demonstrates global hypokinesis. The left ventricular internal cavity size  was mildly dilated. Left ventricular  diastolic parameters are consistent with Grade I diastolic dysfunction  (impaired relaxation).   3. Right ventricular systolic function is normal. The right ventricular  size is normal. There is mildly elevated pulmonary artery systolic  pressure.   4. Left atrial size was mildly dilated.   5. The mitral valve is normal in structure. Mild mitral valve  regurgitation. No evidence of mitral stenosis.   6. The aortic valve is tricuspid. Aortic valve regurgitation is mild. No  aortic stenosis is present.   7. The inferior vena cava is normal in size with greater than 50%  respiratory variability, suggesting right atrial pressure of 3 mmHg.    ASSESSMENT:    1. DOE (dyspnea on exertion)   2. Cardiomyopathy, unspecified type ([place]: EF 40 - 45%, grade I DD   3. Essential hypertension   4. Mild aortic insufficiency   5. Mild mitral regurgitation  6. Type 2 diabetes mellitus without complication, without long-term current use of insulin (HCC)     PLAN:  Kristy [name] is a very pleasant 81 year old African-American female with a history of essential hypertension for greater than 15 years and most recently has been on amlodipine 10 mg daily, benazepril 40 mg and HCTZ 25 mg.  I saw her for initial evaluation in May 2021 after she had been seen by Dr. Isaac Bliss who noted irregularity to her heart rate.  The patient was planning to undergo knee surgery.   When I saw her for initial preoperative evaluation on Mar 07, 2020, her ECG showed sinus rhythm with atrial trigeminy.  Potassium drawn the day prior to her initial evaluation was low at 3.1 and hemoglobin A1c was increased and she was started on metformin and given potassium replacement.  During that initial evaluation I recommended initiation of metoprolol succinate 25 mg daily and reduction of her  HCTZ to 12.5 mg daily.  Subsequent lab work showed normalization of potassium.  An echo Doppler study subsequent laboratory has shown normalization of potassium.  An echo Doppler study May 2021 demonstrated reduced LV function EF 40 -45% with mild LV dilation, aortic insufficiency as well as mitral regurgitation.  With her reduction in LV function, I recommended further evaluation with a Lexiscan Myoview study which was done on March 26, 2020.  This did not reveal any ST segment deviation and she had normal perfusion.  The study was interpreted as low risk.  He was given clearance to undergo her knee surgery which was done by Dr. Reynaldo Minium.  She had recently developed a left submandibular cellulitis/infarct summation and required Rocephin, Augmentin and Decadron therapy.  Evaluated by ENT.  Recently, she admits that she gets more short of breath particularly with walking to her car or climbing steps.  She denies any chest tightness.  I have recommended she undergo a follow-up echo Doppler study for reassessment of LV function.  I also discussed the possibility of obstructive sleep apnea particularly with her frequent awakenings, nocturia, and fatigability.  On exam, her blood pressure today is stable at 124/70.  She continues to be on amlodipine 10 mg, benazepril 40 mg, HCTZ 12.5 mg in addition to metoprolol succinate.  Her ECG is stable.  We will contact her regarding her echo Doppler results.  As long as she is stable I will see her in 6 months for follow-up evaluation but we will see her sooner if needed.   Presently, she denies any chest pain.  She denies shortness of breath.  She has experienced some mild ankle swelling which may be contributed by her Celebrex as well as amlodipine.  She has continued to be on her antihypertensive medical regimen consisting of amlodipine, benazepril, HCTZ in addition to metoprolol succinate now at 50 mg.  Her blood pressure today is stable.  I believe she is stable to undergo  planned knee surgery to be done by Dr. Sandie Ano next month.  As long as she is stable, I will see her in 1 year for follow-up evaluation.   Medication Adjustments/Labs and Tests Ordered: Current medicines are reviewed at length with the patient today.  Concerns regarding medicines are outlined above.  Medication changes, Labs and Tests ordered today are listed in the Patient Instructions below. Patient Instructions  Medication Instructions:  The current medical regimen is effective;  continue present plan and medications as directed. Please refer to the Current Medication list given to you today.   *  If you need a refill on your cardiac medications before your next appointment, please call your pharmacy*  Lab Work:    NONE      Testing/Procedures:  Echocardiogram - Your physician has requested that you have an echocardiogram. Echocardiography is a painless test that uses sound waves to create images of your heart. It provides your doctor with information about the size and shape of your heart and how well your hearts chambers and valves are working. This procedure takes approximately one hour. There are no restrictions for this procedure. This will be performed at either our [facility] location - [address], [place] location [facility] 2nd floor.   Follow-Up: Your next appointment:  6 month(s) In Person with Shelva Majestic, MD   Please call our office 2 months in advance to schedule this appointment   At [facility], you and your health needs are our priority.  As part of our continuing mission to provide you with exceptional heart care, we have created designated Provider Care Teams.  These Care Teams include your primary Cardiologist (physician) and Advanced Practice Providers (APPs -  Physician Assistants and Nurse Practitioners) who all work together to provide you with the care you need, when you need it.  We recommend signing up for the patient portal  called "MyChart".  Sign up information is provided on this After Visit Summary.  MyChart is used to connect with patients for Virtual Visits (Telemedicine).  Patients are able to view lab/test results, encounter notes, upcoming appointments, etc.  Non-urgent messages can be sent to your provider as well.   To learn more about what you can do with MyChart, go to [email or web address].        Signed, Shelva Majestic, MD  11/13/2021 6:08 PM    [facility] [address] [place], [place], [place]  [address] Phone: [phone]

## 2021-11-13 NOTE — Patient Instructions (Addendum)
Medication Instructions:  The current medical regimen is effective;  continue present plan and medications as directed. Please refer to the Current Medication list given to you today.   *If you need a refill on your cardiac medications before your next appointment, please call your pharmacy*  Lab Work:    NONE      Testing/Procedures:  Echocardiogram - Your physician has requested that you have an echocardiogram. Echocardiography is a painless test that uses sound waves to create images of your heart. It provides your doctor with information about the size and shape of your heart and how well your hearts chambers and valves are working. This procedure takes approximately one hour. There are no restrictions for this procedure. This will be performed at either our [facility] location - [address] -or- [place] location [facility] 2nd floor.   Follow-Up: Your next appointment:  6 month(s) In Person with Kristy Guadalajara, MD   Please call our office 2 months in advance to schedule this appointment   At [facility], you and your health needs are our priority.  As part of our continuing mission to provide you with exceptional heart care, we have created designated Provider Care Teams.  These Care Teams include your primary Cardiologist (physician) and Advanced Practice Providers (APPs -  Physician Assistants and Nurse Practitioners) who all work together to provide you with the care you need, when you need it.  We recommend signing up for the patient portal called "MyChart".  Sign up information is provided on this After Visit Summary.  MyChart is used to connect with patients for Virtual Visits (Telemedicine).  Patients are able to view lab/test results, encounter notes, upcoming appointments, etc.  Non-urgent messages can be sent to your provider as well.   To learn more about what you can do with MyChart, go to [email or web address].

## 2021-11-14 ENCOUNTER — Ambulatory Visit (INDEPENDENT_AMBULATORY_CARE_PROVIDER_SITE_OTHER): Payer: Medicare Other

## 2021-11-14 DIAGNOSIS — I10 Essential (primary) hypertension: Secondary | ICD-10-CM

## 2021-11-14 DIAGNOSIS — M1711 Unilateral primary osteoarthritis, right knee: Secondary | ICD-10-CM

## 2021-11-14 DIAGNOSIS — E119 Type 2 diabetes mellitus without complications: Secondary | ICD-10-CM

## 2021-11-14 NOTE — Chronic Care Management (AMB) (Signed)
Chronic Care Management   CCM RN Visit Note  11/14/2021 Name: JAEDIN TRUMBO MRN: [id number] DOB: [birth year]  Subjective: Kristy [name] is a 81 y.o. year old female who is a primary care patient of Isaac Bliss, Rayford Halsted, MD. The care management team was consulted for assistance with disease management and care coordination needs.    Engaged with patient by telephone for follow up visit in response to provider referral for case management and/or care coordination services.   Consent to Services:  The patient was given information about Chronic Care Management services, agreed to services, and gave verbal consent prior to initiation of services.  Please see initial visit note for detailed documentation.   Patient agreed to services and verbal consent obtained.   Assessment: Review of patient past medical history, allergies, medications, health status, including review of consultants reports, laboratory and other test data, was performed as part of comprehensive evaluation and provision of chronic care management services.   SDOH (Social Determinants of Health) assessments and interventions performed:    CCM Care Plan  Allergies  Allergen Reactions   Clonidine Hives    Outpatient Encounter Medications as of 11/14/2021  Medication Sig   albuterol (VENTOLIN HFA) 108 (90 Base) MCG/ACT inhaler INHALE 2 PUFFS BY MOUTH EVERY 6 HOURS AS NEEDED FOR WHEEZE (Patient taking differently: Inhale 2 puffs into the lungs every 6 (six) hours as needed for shortness of breath or wheezing.)   amLODipine (NORVASC) 10 MG tablet TAKE 1 TABLET BY MOUTH EVERY DAY   benazepril (LOTENSIN) 40 MG tablet TAKE 1 TABLET BY MOUTH EVERY DAY   celecoxib (CELEBREX) 200 MG capsule TAKE 1 CAPSULE BY MOUTH EVERY DAY   hydrochlorothiazide (HYDRODIURIL) 12.5 MG tablet TAKE 1 TABLET (12.5 MG TOTAL) BY MOUTH AS NEEDED. FOR SWELLING (Patient taking differently: Take 12.5 mg by mouth daily.)   metFORMIN (GLUCOPHAGE) 500 MG  tablet TAKE 1 TABLET BY MOUTH 2 TIMES DAILY WITH A MEAL.   metoprolol succinate (TOPROL-XL) 50 MG 24 hr tablet TAKE 1 TABLET BY MOUTH EVERY DAY   WIXELA INHUB 250-50 MCG/DOSE AEPB INHALE 1 PUFF BY MOUTH EVERY 12 HOURS (Patient taking differently: Inhale 1 puff into the lungs 2 (two) times daily as needed (congestion/difficulty breathing.).)   No facility-administered encounter medications on file as of 11/14/2021.    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) 02/28/2021   Osteoarthritis of right knee 12/09/2020   Hypokalemia 03/07/2020   Type 2 diabetes mellitus without complication ([place]) 94/70/9628   IGT (impaired glucose tolerance) 03/14/2019   Acute labyrinthitis, unspecified laterality 09/08/2018   Trimalleolar fracture of right ankle 06/24/2018   Closed trimalleolar fracture of right ankle 06/21/2018   OA (osteoarthritis) of knee 09/22/2012   UNSPECIFIED DISORDER TEETH&SUPPORTING STRUCTURES 09/09/2010   Essential hypertension 04/07/2007   Allergic rhinitis 04/07/2007   Asthma 04/07/2007   GERD 04/07/2007   COLONIC POLYPS, HX OF 04/07/2007    Conditions to be addressed/monitored:HTN, DMII, and Osteoarthritis  Care Plan : RN Care Manager Plan of Care  Updates made by Dimitri Ped, RN since 11/14/2021 12:00 AM     Problem: Chronic Disease Management and Care Coordination Needs (HTN, DM, hx falls)   Priority: High     Long-Range Goal: Establish Plan of Care for Chronic Disease Management Needs (HTN, DM, hx falls)   Start Date: 11/14/2021  Expected End Date: 11/11/2022  Priority: High  Note:   Current Barriers:  Chronic Disease Management support and education needs related to HTN, DMII, and Osteoarthritis  States that she is mostly healed from her infected saliva glands and only has a little swelling left.  Denies any issues with swallowing.  States her B/P has been  good ranging 120/65-130/75.  States she does nto check her blood sugars since she is well controled.  States she tries to follow a healthy low sodium diet.  States she is walking her dog every day for 30-60 minutes. Denies any falls and denies any pain.  RNCM Clinical Goal(s):  Patient will verbalize understanding of plan for management of HTN, DMII, and Osteoarthritis as evidenced by voiced adherence to plan of care verbalize basic understanding of  HTN, DMII, and Osteoarthritis disease process and self health management plan as evidenced by voiced understanding and teach back take all medications exactly as prescribed and will call provider for medication related questions as evidenced by dispense report and pt verbalization attend all scheduled medical appointments: echo 11/19/21, ENT 01/29/22 as evidenced by medical records demonstrate Ongoing adherence to prescribed treatment plan for HTN, DMII, and Osteoarthritis as evidenced by readings within limits, voiced adherence to plan of care continue to work with RN Care Manager to address care management and care coordination needs related to  HTN, DMII, and Osteoarthritis as evidenced by adherence to CM Team Scheduled appointments through collaboration with RN Care manager, provider, and care team.   Interventions: 1:1 collaboration with primary care provider regarding development and update of comprehensive plan of care as evidenced by provider attestation and co-signature Inter-disciplinary care team collaboration (see longitudinal plan of care) Evaluation of current treatment plan related to  self management and patient's adherence to plan as established by provider   Diabetes Interventions:  (Status:  Goal on track:  Yes.) Long Term Goal Assessed patient's understanding of A1c goal: <6.5% Provided education to patient about basic DM disease process Reviewed medications with patient and discussed importance of medication adherence Counseled on  importance of regular laboratory monitoring as prescribed Reviewed to get regular exercise to help keep blood sugars under control Lab Results  Component Value Date   HGBA1C 5.8 (A) 09/03/2021   Falls Interventions:  (Status:  Goal on track:  Yes.) Long Term Goal Reviewed medications and discussed potential side effects of medications such as dizziness and frequent urination Advised patient of importance of notifying provider of falls Assessed for falls since last encounter Assessed patients knowledge of fall risk prevention secondary to previously provided education   Hypertension Interventions:  (Status:  Goal on track:  Yes.) Long Term Goal Last practice recorded BP readings:  BP Readings from Last 3 Encounters:  11/13/21 126/78  09/23/21 (!) 160/80  09/12/21 140/89  Most recent eGFR/CrCl: No results found for: EGFR  No components found for: CRCL  Evaluation of current treatment plan related to hypertension self management and patient's adherence to plan as established by provider Provided education to patient re: stroke prevention, s/s of heart attack and stroke Reviewed medications with patient and discussed importance of compliance Counseled on the importance of exercise goals with target of 150 minutes per week Discussed plans with patient for ongoing care management follow up and provided patient with direct contact information for care management team Advised patient, providing education and rationale, to monitor blood pressure daily and record, calling PCP for findings outside established parameters Provided education on prescribed diet low sodium low CHO  Patient Goals/Self-Care Activities:  Take all medications as prescribed Attend all scheduled provider appointments Call pharmacy for medication refills 3-7 days in advance of running out of medications Perform all self care activities independently  Perform IADL's (shopping, preparing meals, housekeeping, managing  finances) independently Call provider office for new concerns or questions  keep appointment with eye doctor check feet daily for cuts, sores or redness drink 6 to 8 glasses of water each day fill half of plate with vegetables limit fast food meals to no more than 1 per week manage portion size keep feet up while sitting wash and dry feet carefully every day check blood pressure 3 times per week choose a place to take my blood pressure (home, clinic or office, retail store) take blood pressure log to all doctor appointments call doctor for signs and symptoms of high blood pressure keep all doctor appointments eat more whole grains, fruits and vegetables, lean meats and healthy fats limit salt intake to 2328m/day  Follow Up Plan:  Telephone follow up appointment with care management team member scheduled for:  01/29/22 The patient has been provided with contact information for the care management team and has been advised to call with any health related questions or concerns.       Plan:Telephone follow up appointment with care management team member scheduled for:  01/29/22 The patient has been provided with contact information for the care management team and has been advised to call with any health related questions or concerns.  MPeter GarterRN, BJackquline Denmark CDE Care Management Coordinator [facility] ([phone]

## 2021-11-14 NOTE — Patient Instructions (Signed)
Visit Information  Thank you for taking time to visit with me today. Please don't hesitate to contact me if I can be of assistance to you before our next scheduled telephone appointment.  Following are the goals we discussed today:  Take all medications as prescribed Attend all scheduled provider appointments Call pharmacy for medication refills 3-7 days in advance of running out of medications Perform all self care activities independently  Perform IADL's (shopping, preparing meals, housekeeping, managing finances) independently Call provider office for new concerns or questions  keep appointment with eye doctor check feet daily for cuts, sores or redness drink 6 to 8 glasses of water each day fill half of plate with vegetables limit fast food meals to no more than 1 per week manage portion size keep feet up while sitting wash and dry feet carefully every day check blood pressure 3 times per week choose a place to take my blood pressure (home, clinic or office, retail store) take blood pressure log to all doctor appointments call doctor for signs and symptoms of high blood pressure keep all doctor appointments eat more whole grains, fruits and vegetables, lean meats and healthy fats limit salt intake to 2300mg /day  Our next appointment is by telephone on 01/29/22 at 2:15 PM  Please call the care guide team at [phone] if you need to cancel or reschedule your appointment.   If you are experiencing a Mental Health or Behavioral Health Crisis or need someone to talk to, please call the Suicide and Crisis Lifeline: 988 call the 10-25-1986 National Suicide Prevention Lifeline: 302-744-3657 or TTY: (434)773-9826 TTY 782-608-9079) to talk to a trained counselor call 1-800-273-TALK (toll free, 24 hour hotline) go to [facility] [address], [place] [phone]) call 911   Patient verbalizes understanding of instructions and care plan provided  today and agrees to view in MyChart. Active MyChart status confirmed with patient.    ([phone] RN, Dudley Major, CDE Care Management Coordinator [facility] [phone]

## 2021-11-18 DIAGNOSIS — Z7984 Long term (current) use of oral hypoglycemic drugs: Secondary | ICD-10-CM

## 2021-11-18 DIAGNOSIS — I10 Essential (primary) hypertension: Secondary | ICD-10-CM

## 2021-11-18 DIAGNOSIS — E1159 Type 2 diabetes mellitus with other circulatory complications: Secondary | ICD-10-CM | POA: Diagnosis not present

## 2021-11-19 ENCOUNTER — Ambulatory Visit (HOSPITAL_COMMUNITY): Payer: Medicare Other | Attending: Cardiology

## 2021-11-19 ENCOUNTER — Other Ambulatory Visit: Payer: Self-pay

## 2021-11-19 DIAGNOSIS — R0609 Other forms of dyspnea: Secondary | ICD-10-CM | POA: Diagnosis not present

## 2021-11-19 LAB — ECHOCARDIOGRAM COMPLETE
Area-P 1/2: 3.72 cm2
MV M vel: 5.14 m/s
MV Peak grad: 105.7 mmHg
P 1/2 time: 467 msec
S' Lateral: 3 cm

## 2021-12-18 ENCOUNTER — Other Ambulatory Visit: Payer: Self-pay | Admitting: Internal Medicine

## 2021-12-18 DIAGNOSIS — I10 Essential (primary) hypertension: Secondary | ICD-10-CM

## 2022-01-09 DIAGNOSIS — M1712 Unilateral primary osteoarthritis, left knee: Secondary | ICD-10-CM | POA: Diagnosis not present

## 2022-01-09 DIAGNOSIS — Z96651 Presence of right artificial knee joint: Secondary | ICD-10-CM | POA: Diagnosis not present

## 2022-01-29 ENCOUNTER — Ambulatory Visit (INDEPENDENT_AMBULATORY_CARE_PROVIDER_SITE_OTHER): Payer: Medicare Other

## 2022-01-29 DIAGNOSIS — R1310 Dysphagia, unspecified: Secondary | ICD-10-CM | POA: Diagnosis not present

## 2022-01-29 DIAGNOSIS — M26609 Unspecified temporomandibular joint disorder, unspecified side: Secondary | ICD-10-CM | POA: Diagnosis not present

## 2022-01-29 DIAGNOSIS — I10 Essential (primary) hypertension: Secondary | ICD-10-CM

## 2022-01-29 DIAGNOSIS — E119 Type 2 diabetes mellitus without complications: Secondary | ICD-10-CM

## 2022-01-29 DIAGNOSIS — M1711 Unilateral primary osteoarthritis, right knee: Secondary | ICD-10-CM

## 2022-01-29 NOTE — Chronic Care Management (AMB) (Signed)
?Chronic Care Management  ? ?CCM RN Visit Note ? ?01/29/2022 ?Name: Kristy [name] MRN: [id number] DOB: 09/08/1941 ? ?Subjective: ?Kristy [name] is a 81 y.o. year old female who is a primary care patient of Isaac Bliss, Rayford Halsted, MD. The care management team was consulted for assistance with disease management and care coordination needs.   ? ?Engaged with patient by telephone for follow up visit in response to provider referral for case management and/or care coordination services.  ? ?Consent to Services:  ?The patient was given information about Chronic Care Management services, agreed to services, and gave verbal consent prior to initiation of services.  Please see initial visit note for detailed documentation.  ? ?Patient agreed to services and verbal consent obtained.  ? ?Assessment: Review of patient past medical history, allergies, medications, health status, including review of consultants reports, laboratory and other test data, was performed as part of comprehensive evaluation and provision of chronic care management services.  ? ?SDOH (Social Determinants of Health) assessments and interventions performed:   ? ?CCM Care Plan ? ?Allergies  ?Allergen Reactions  ? Clonidine Hives  ? ? ?Outpatient Encounter Medications as of 01/29/2022  ?Medication Sig  ? albuterol (VENTOLIN HFA) 108 (90 Base) MCG/ACT inhaler INHALE 2 PUFFS BY MOUTH EVERY 6 HOURS AS NEEDED FOR WHEEZE (Patient taking differently: Inhale 2 puffs into the lungs every 6 (six) hours as needed for shortness of breath or wheezing.)  ? amLODipine (NORVASC) 10 MG tablet TAKE 1 TABLET BY MOUTH EVERY DAY  ? benazepril (LOTENSIN) 40 MG tablet TAKE 1 TABLET BY MOUTH EVERY DAY  ? celecoxib (CELEBREX) 200 MG capsule TAKE 1 CAPSULE BY MOUTH EVERY DAY  ? hydrochlorothiazide (HYDRODIURIL) 12.5 MG tablet TAKE 1 TABLET (12.5 MG TOTAL) BY MOUTH AS NEEDED. FOR SWELLING (Patient taking differently: Take 12.5 mg by mouth daily.)  ? metFORMIN (GLUCOPHAGE) 500 MG  tablet TAKE 1 TABLET BY MOUTH 2 TIMES DAILY WITH A MEAL.  ? metoprolol succinate (TOPROL-XL) 50 MG 24 hr tablet TAKE 1 TABLET BY MOUTH EVERY DAY  ? WIXELA INHUB 250-50 MCG/DOSE AEPB INHALE 1 PUFF BY MOUTH EVERY 12 HOURS (Patient taking differently: Inhale 1 puff into the lungs 2 (two) times daily as needed (congestion/difficulty breathing.).)  ? ?No facility-administered encounter medications on file as of 01/29/2022.  ? ? ?Patient Active Problem List  ? Diagnosis Date Noted  ? Cellulitis of neck 09/05/2021  ? 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 Osteoarthritis of right knee 12/09/2020  ? Hypokalemia 03/07/2020  ? Type 2 diabetes mellitus without complication ([place]) 28/76/8115  ? IGT (impaired glucose tolerance) 03/14/2019  ? Acute labyrinthitis, unspecified laterality 09/08/2018  ? Trimalleolar fracture of right ankle 06/24/2018  ? Closed trimalleolar fracture of right ankle 06/21/2018  ? OA (osteoarthritis) of knee 09/22/2012  ? UNSPECIFIED DISORDER TEETH&SUPPORTING STRUCTURES 09/09/2010  ? Essential hypertension 04/07/2007  ? Allergic rhinitis 04/07/2007  ? Asthma 04/07/2007  ? GERD 04/07/2007  ? COLONIC POLYPS, HX OF 04/07/2007  ? ? ?Conditions to be addressed/monitored:HTN, DMII, and Osteoarthritis ? ?Care Plan : RN Care Manager Plan of Care  ?Updates made by Dimitri Ped, RN since 01/29/2022 12:00 AM  ?  ? ?Problem: Chronic Disease Management and Care Coordination Needs (HTN, DM, hx falls)   ?Priority: High  ?  ? ?Long-Range Goal: Establish Plan of Care for Chronic Disease Management Needs (HTN, DM, hx falls)   ?Start Date: 11/14/2021  ?Expected End Date: 11/11/2022  ?  Priority: High  ?Note:   ?Current Barriers:  ?Chronic Disease Management support and education needs related to HTN, DMII, and Osteoarthritis  ?States that she saw ENT today and her saliva glands are healed.  Denies any issues with swallowing.  States her B/P has been good and was 135/73 today.  States she  does not check her blood sugars since she is well controled.  States she tries to follow a healthy low sodium diet.  States she is walking her dog every day for 30-60 minutes and she has started back with dancing. Denies any falls and denies any pain.  ?RNCM Clinical Goal(s):  ?Patient will verbalize understanding of plan for management of HTN, DMII, and Osteoarthritis as evidenced by voiced adherence to plan of care ?verbalize basic understanding of  HTN, DMII, and Osteoarthritis disease process and self health management plan as evidenced by voiced understanding and teach back ?take all medications exactly as prescribed and will call provider for medication related questions as evidenced by dispense report and pt verbalization ?attend all scheduled medical appointments: none scheduled at this time as evidenced by medical records ?demonstrate Ongoing adherence to prescribed treatment plan for HTN, DMII, and Osteoarthritis as evidenced by readings within limits, voiced adherence to plan of care ?continue to work with RN Care Manager to address care management and care coordination needs related to  HTN, DMII, and Osteoarthritis as evidenced by adherence to CM Team Scheduled appointments through collaboration with RN Care manager, provider, and care team.  ? ?Interventions: ?1:1 collaboration with primary care provider regarding development and update of comprehensive plan of care as evidenced by provider attestation and co-signature ?Inter-disciplinary care team collaboration (see longitudinal plan of care) ?Evaluation of current treatment plan related to  self management and patient's adherence to plan as established by provider ? ? ?Diabetes Interventions:  (Status:  Goal on track:  Yes.) Long Term Goal ?Assessed patient's understanding of A1c goal: <6.5% ?Provided education to patient about basic DM disease process ?Reviewed medications with patient and discussed importance of medication adherence ?Counseled on  importance of regular laboratory monitoring as prescribed ?Reinforced to get regular exercise to help keep blood sugars under control ?Lab Results  ?Component Value Date  ? HGBA1C 5.8 (A) 09/03/2021  ? ?Falls Interventions:  (Status:  Goal on track:  Yes.) Long Term Goal ?Reviewed medications and discussed potential side effects of medications such as dizziness and frequent urination ?Advised patient of importance of notifying provider of falls ?Assessed for falls since last encounter ?Assessed patients knowledge of fall risk prevention secondary to previously provided education ?Reviewed to continue to exercise to help maintain her balance ? ? ?Hypertension Interventions:  (Status:  Goal on track:  Yes.) Long Term Goal ?Last practice recorded BP readings:  ?BP Readings from Last 3 Encounters:  ?11/13/21 126/78  ?09/23/21 (!) 160/80  ?09/12/21 140/89  ?Most recent eGFR/CrCl: No results found for: EGFR  No components found for: CRCL ? ?Evaluation of current treatment plan related to hypertension self management and patient's adherence to plan as established by provider ?Provided education to patient re: stroke prevention, s/s of heart attack and stroke ?Reviewed medications with patient and discussed importance of compliance ?Counseled on the importance of exercise goals with target of 150 minutes per week ?Discussed plans with patient for ongoing care management follow up and provided patient with direct contact information for care management team ?Advised patient, providing education and rationale, to monitor blood pressure daily and record, calling PCP for findings outside established  parameters ?Provided education on prescribed diet low sodium low CHO ?Reviewed benefits of weight loss on B/P ? ?Patient Goals/Self-Care Activities: ?Take all medications as prescribed ?Attend all scheduled provider appointments ?Call pharmacy for medication refills 3-7 days in advance of running out of medications ?Perform all self  care activities independently  ?Perform IADL's (shopping, preparing meals, housekeeping, managing finances) independently ?Call provider office for new concerns or questions  ?keep appointment with eye

## 2022-01-29 NOTE — Telephone Encounter (Signed)
?  Care Management  ? ?Follow Up Note ? ? ?01/29/2022 ?Name: Kristy [name] MRN: [id number] DOB: February 16, 1941 ? ? ?Referred by: Philip Aspen, Limmie Patricia, MD ?Reason for referral : Error ?Error ?

## 2022-01-29 NOTE — Patient Instructions (Signed)
Visit Information ? ?Thank you for taking time to visit with me today. Please don't hesitate to contact me if I can be of assistance to you before our next scheduled telephone appointment. ? ?Following are the goals we discussed today:  ?Take all medications as prescribed ?Attend all scheduled provider appointments ?Call pharmacy for medication refills 3-7 days in advance of running out of medications ?Perform all self care activities independently  ?Perform IADL's (shopping, preparing meals, housekeeping, managing finances) independently ?Call provider office for new concerns or questions  ?keep appointment with eye doctor ?check feet daily for cuts, sores or redness ?drink 6 to 8 glasses of water each day ?fill half of plate with vegetables ?limit fast food meals to no more than 1 per week ?manage portion size ?keep feet up while sitting ?wash and dry feet carefully every day ?check blood pressure 3 times per week ?choose a place to take my blood pressure (home, clinic or office, retail store) ?take blood pressure log to all doctor appointments ?call doctor for signs and symptoms of high blood pressure ?keep all doctor appointments ?eat more whole grains, fruits and vegetables, lean meats and healthy fats ?limit salt intake to 2300mg /day ? ?Our next appointment is by telephone on 05/14/22 at 2:15 PM ? ?Please call the care guide team at [phone] if you need to cancel or reschedule your appointment.  ? ?If you are experiencing a Mental Health or [place] or need someone to talk to, please call the Suicide and Crisis Lifeline: 988 ?call the [place] National Suicide Prevention Lifeline: (515)672-6942 or TTY: 3656047576 TTY [phone]) to talk to a trained counselor ?call 1-800-273-TALK (toll free, 24 hour hotline) ?go to [facility] [address] [place] [phone]) ?call 911  ? ?Patient verbalizes understanding of instructions and care plan provided  today and agrees to view in [place]. Active MyChart status confirmed with patient.   ?Peter Garter RN, BSN,CCM, CDE ?Care Management Coordinator ?[facility] ?[phone]   ?

## 2022-02-09 ENCOUNTER — Other Ambulatory Visit: Payer: Self-pay | Admitting: Cardiovascular Disease

## 2022-02-09 ENCOUNTER — Other Ambulatory Visit: Payer: Self-pay | Admitting: Internal Medicine

## 2022-02-09 DIAGNOSIS — J4520 Mild intermittent asthma, uncomplicated: Secondary | ICD-10-CM

## 2022-02-09 DIAGNOSIS — S82851D Displaced trimalleolar fracture of right lower leg, subsequent encounter for closed fracture with routine healing: Secondary | ICD-10-CM

## 2022-02-11 ENCOUNTER — Other Ambulatory Visit: Payer: Self-pay | Admitting: Internal Medicine

## 2022-02-11 ENCOUNTER — Telehealth: Payer: Self-pay | Admitting: Internal Medicine

## 2022-02-11 MED ORDER — FLUTICASONE-SALMETEROL 250-50 MCG/ACT IN AEPB: 1.0000 | INHALATION_SPRAY | Freq: Two times a day (BID) | RESPIRATORY_TRACT | 0 refills | Status: DC

## 2022-02-11 NOTE — Telephone Encounter (Signed)
Pt requesting refill of WIXELA INHUB 250-50 MCG/DOSE AEPB  sent to  ?[id number] [place], [place] - [address] Phone:  [phone]  ?Fax:  [phone]  ?  ? ?

## 2022-02-11 NOTE — Telephone Encounter (Signed)
Refill sent.

## 2022-02-15 DIAGNOSIS — E119 Type 2 diabetes mellitus without complications: Secondary | ICD-10-CM

## 2022-02-15 DIAGNOSIS — M1711 Unilateral primary osteoarthritis, right knee: Secondary | ICD-10-CM

## 2022-02-15 DIAGNOSIS — I10 Essential (primary) hypertension: Secondary | ICD-10-CM

## 2022-02-23 ENCOUNTER — Ambulatory Visit (INDEPENDENT_AMBULATORY_CARE_PROVIDER_SITE_OTHER): Payer: Medicare Other | Admitting: Internal Medicine

## 2022-02-23 ENCOUNTER — Other Ambulatory Visit: Payer: Self-pay | Admitting: Internal Medicine

## 2022-02-23 ENCOUNTER — Encounter: Payer: Self-pay | Admitting: Internal Medicine

## 2022-02-23 VITALS — BP 138/70 | HR 80 | Temp 98.2°F | Wt 162.0 lb

## 2022-02-23 DIAGNOSIS — J4541 Moderate persistent asthma with (acute) exacerbation: Secondary | ICD-10-CM

## 2022-02-23 DIAGNOSIS — E119 Type 2 diabetes mellitus without complications: Secondary | ICD-10-CM

## 2022-02-23 LAB — POCT GLYCOSYLATED HEMOGLOBIN (HGB A1C): Hemoglobin A1C: 5.7 % — AB (ref 4.0–5.6)

## 2022-02-23 MED ORDER — FLUTICASONE-SALMETEROL 115-21 MCG/ACT IN AERO: 2.0000 | INHALATION_SPRAY | Freq: Two times a day (BID) | RESPIRATORY_TRACT | 12 refills | Status: DC

## 2022-02-23 NOTE — Patient Instructions (Addendum)
-  Nice seeing you today!! ? ?-Take advair twice daily; may use albuterol inhaler every 2 hours as needed for shortness of breath or wheezing. ? ?-Take allegra daily and mucinex twice daily. ? ?-Let me know if not better in 10-14 days. ?

## 2022-02-23 NOTE — Progress Notes (Signed)
? ? ? ?Established Patient Office Visit ? ? ? ? ?CC/Reason for Visit: Discuss wheezing ? ?HPI: Kristy [name] is a 81 y.o. female who is coming in today for the above mentioned reasons. Past Medical History is significant for: Asthma, she has not been using her Wixela.  For the past 3 weeks she has been having cough with wheezing.  She has been using her albuterol inhaler every 6 hours as well as Mucinex.  She took a COVID test at the beginning of her illness that was negative.  She was on vacation in [place] last week and felt very short of breath with minimal exertion. ? ? ?Past Medical/Surgical History: ?Past Medical History:  ?Diagnosis Date  ? Allergic rhinitis, seasonal   ? Arthritis   ? Asthma   ? bronchial with weather change  ? Diabetes mellitus without complication (HCC)   ? type 2   ? Dysrhythmia   ? "Skipping a beat"  ? GERD (gastroesophageal reflux disease)   ? History of colon polyps   ? Hypertension   ? Pneumonia   ? Rotator cuff tear, right   ? ? ?Past Surgical History:  ?Procedure Laterality Date  ? BACK SURGERY    ? x3  ruptured disc   ? CHOLECYSTECTOMY OPEN  AGE 46  ? EYE SURGERY    ? bil cataracts  ? HERNIA REPAIR    ? INGUINAL HERNIA REPAIR Right AGE 46s  ? KNEE ARTHROSCOPY Left 2014  ? LUMBAR DISC SURGERY  x2  LAST DATE EARLY 2000s  ? ORIF ANKLE FRACTURE Right 06/24/2018  ? Procedure: OPEN REDUCTION INTERNAL FIXATION (ORIF) RIGHT ANKLE FRACTURE;  Surgeon: Kathryne Hitch, MD;  Location: [facility];  Service: Orthopedics;  Laterality: Right;  ? ROTATOR CUFF REPAIR Left 01/2015  ? TONSILLECTOMY  AGE 76  ? TOTAL KNEE ARTHROPLASTY Right 12/09/2020  ? Procedure: TOTAL KNEE ARTHROPLASTY;  Surgeon: Ollen Gross, MD;  Location: [facility];  Service: Orthopedics;  Laterality: Right;   ? VAGINAL HYSTERECTOMY  AGE 20  ? WITH BSO  ? ? ?Social History: ? reports that she quit smoking about 33 years ago. Her smoking use included cigarettes. She has never used smokeless tobacco. She reports current  alcohol use of about 3.0 - 4.0 standard drinks per week. She reports that she does not use drugs. ? ?Allergies: ?Allergies  ?Allergen Reactions  ? Clonidine Hives  ? ? ?Family History:  ?Family History  ?Problem Relation Age of Onset  ? Colon cancer Neg Hx   ? Esophageal cancer Neg Hx   ? Rectal cancer Neg Hx   ? Stomach cancer Neg Hx   ? ? ? ?Current Outpatient Medications:  ?  albuterol (VENTOLIN HFA) 108 (90 Base) MCG/ACT inhaler, INHALE 2 PUFFS BY MOUTH EVERY 6 HOURS AS NEEDED FOR WHEEZING, Disp: 6.7 each, Rfl: 1 ?  amLODipine (NORVASC) 10 MG tablet, TAKE 1 TABLET BY MOUTH EVERY DAY, Disp: 90 tablet, Rfl: 0 ?  benazepril (LOTENSIN) 40 MG tablet, TAKE 1 TABLET BY MOUTH EVERY DAY, Disp: 90 tablet, Rfl: 1 ?  celecoxib (CELEBREX) 200 MG capsule, TAKE 1 CAPSULE BY MOUTH EVERY DAY, Disp: 90 capsule, Rfl: 0 ?  fluticasone-salmeterol (ADVAIR HFA) 115-21 MCG/ACT inhaler, Inhale 2 puffs into the lungs 2 (two) times daily., Disp: 1 each, Rfl: 12 ?  hydrochlorothiazide (HYDRODIURIL) 12.5 MG tablet, TAKE 1 TABLET (12.5 MG TOTAL) BY MOUTH AS NEEDED. FOR SWELLING (Patient taking differently: Take 12.5 mg by mouth daily.), Disp: 90 tablet,  Rfl: 3 ?  metFORMIN (GLUCOPHAGE) 500 MG tablet, TAKE 1 TABLET BY MOUTH 2 TIMES DAILY WITH A MEAL., Disp: 180 tablet, Rfl: 1 ?  metoprolol succinate (TOPROL-XL) 50 MG 24 hr tablet, Take 1 tablet (50 mg total) by mouth daily. Patient need a appointment for future refills.., Disp: 90 tablet, Rfl: 0 ? ?Review of Systems:  ?Constitutional: Denies fever, chills, diaphoresis, appetite change. ?HEENT: Denies photophobia, eye pain, redness, mouth sores, trouble swallowing, neck pain, neck stiffness and tinnitus.   ?Respiratory: Denies  chest tightness. ?Cardiovascular: Denies chest pain, palpitations and leg swelling.  ?Gastrointestinal: Denies nausea, vomiting, abdominal pain, diarrhea, constipation, blood in stool and abdominal distention.  ?Genitourinary: Denies dysuria, urgency, frequency,  hematuria, flank pain and difficulty urinating.  ?Endocrine: Denies: hot or cold intolerance, sweats, changes in hair or nails, polyuria, polydipsia. ?Musculoskeletal: Denies myalgias, back pain, joint swelling, arthralgias and gait problem.  ?Skin: Denies pallor, rash and wound.  ?Neurological: Denies dizziness, seizures, syncope, weakness, light-headedness, numbness and headaches.  ?Hematological: Denies adenopathy. Easy bruising, personal or family bleeding history  ?Psychiatric/Behavioral: Denies suicidal ideation, mood changes, confusion, nervousness, sleep disturbance and agitation ? ? ? ?Physical Exam: ?Vitals:  ? 02/23/22 0810 02/23/22 0813  ?BP: (!) 150/80 138/70  ?Pulse: 80   ?Temp: 98.2 ?F (36.8 ?C)   ?TempSrc: Oral   ?SpO2: 97%   ?Weight: 162 lb (73.5 kg)   ? ? ?Body mass index is 31.64 kg/m?. ? ? ?Constitutional: NAD, calm, comfortable, audible wheezing ?Eyes: PERRL, lids and conjunctivae normal ?ENMT: Mucous membranes are moist. Posterior pharynx is erythematous but clear of any exudate or lesions. Normal dentition. Tympanic membrane is pearly white, no erythema or bulging. ?Respiratory: Lungs are clear to auscultation bilaterally, no wheezing, no crackles. Normal respiratory effort. No accessory muscle use.  Wheezing appears to be upper airway ?Cardiovascular: Regular rate and rhythm, no murmurs / rubs / gallops. No extremity edema. 2+ pedal pulses. No carotid bruits.  ?Psychiatric: Normal judgment and insight. Alert and oriented x 3. Normal mood.  ? ? ?Impression and Plan: ? ?Type 2 diabetes mellitus without complication, unspecified whether long term insulin use (HCC) ? - Plan: POCT glycosylated hemoglobin (Hb A1C) ?-A1c looks good at 5.7. ? ?Moderate persistent asthma with acute exacerbation  ?- Plan: fluticasone-salmeterol (ADVAIR HFA) 115-21 MCG/ACT inhaler ?-All wheezing appears to be upper airway.  She has not been using her inhaled steroid/LABA and suspect this is part of her problem.  This  might have been exacerbated by either spring allergies or URI.  In addition to inhaled steroid/LABA, she can use rescue inhaler every 2 hours as needed, have also advised daily antihistamine and twice daily Mucinex.  She knows to follow-up with me in 10 to 14 days if no improvement.  See no need for systemic steroids at this time. ? ? ? ?Time spent:30 minutes reviewing chart, interviewing and examining patient and formulating plan of care. ? ? ?Patient Instructions  ?-Nice seeing you today!! ? ?-Take advair twice daily; may use albuterol inhaler every 2 hours as needed ofr shortness of breath or wheezing. ? ?-Take allegra daily and mucinex twice daily. ? ?-Let me know if not better in 10-14 days. ? ? ? ?Chaya Jan, MD ?[facility] ? ? ?

## 2022-02-24 ENCOUNTER — Other Ambulatory Visit: Payer: Self-pay | Admitting: Internal Medicine

## 2022-02-24 DIAGNOSIS — J4541 Moderate persistent asthma with (acute) exacerbation: Secondary | ICD-10-CM

## 2022-02-25 ENCOUNTER — Other Ambulatory Visit: Payer: Self-pay | Admitting: *Deleted

## 2022-02-25 ENCOUNTER — Other Ambulatory Visit: Payer: Self-pay | Admitting: Internal Medicine

## 2022-02-25 MED ORDER — FLUTICASONE-SALMETEROL 250-50 MCG/ACT IN AEPB: 1.0000 | INHALATION_SPRAY | Freq: Two times a day (BID) | RESPIRATORY_TRACT | 2 refills | Status: DC

## 2022-02-25 NOTE — Telephone Encounter (Signed)
Left message on voicemail to call office.  

## 2022-02-25 NOTE — Telephone Encounter (Signed)
Pt called back stating insurance will not pay for Advair Inhaler. She would like to go back on Wixela Inhaler 250-50. Okay to send Rx? ?

## 2022-02-25 NOTE — Telephone Encounter (Signed)
Pt call and stated her insurance want pt for advair and she stated she want to go back to the to the one she was on. ?

## 2022-03-15 ENCOUNTER — Other Ambulatory Visit: Payer: Self-pay | Admitting: Internal Medicine

## 2022-03-15 ENCOUNTER — Other Ambulatory Visit: Payer: Self-pay | Admitting: Cardiovascular Disease

## 2022-03-15 DIAGNOSIS — I10 Essential (primary) hypertension: Secondary | ICD-10-CM

## 2022-03-18 ENCOUNTER — Encounter: Payer: Self-pay | Admitting: Internal Medicine

## 2022-03-18 DIAGNOSIS — H524 Presbyopia: Secondary | ICD-10-CM | POA: Diagnosis not present

## 2022-03-18 DIAGNOSIS — H18513 Endothelial corneal dystrophy, bilateral: Secondary | ICD-10-CM | POA: Diagnosis not present

## 2022-03-18 DIAGNOSIS — R7303 Prediabetes: Secondary | ICD-10-CM | POA: Diagnosis not present

## 2022-03-18 DIAGNOSIS — H35033 Hypertensive retinopathy, bilateral: Secondary | ICD-10-CM | POA: Diagnosis not present

## 2022-03-18 LAB — HM DIABETES EYE EXAM

## 2022-03-19 ENCOUNTER — Encounter: Payer: Self-pay | Admitting: Internal Medicine

## 2022-03-25 ENCOUNTER — Encounter: Payer: Self-pay | Admitting: Internal Medicine

## 2022-03-25 ENCOUNTER — Ambulatory Visit (INDEPENDENT_AMBULATORY_CARE_PROVIDER_SITE_OTHER): Payer: Medicare Other

## 2022-03-25 ENCOUNTER — Other Ambulatory Visit: Payer: Self-pay | Admitting: Internal Medicine

## 2022-03-25 ENCOUNTER — Ambulatory Visit (INDEPENDENT_AMBULATORY_CARE_PROVIDER_SITE_OTHER): Payer: Medicare Other | Admitting: Internal Medicine

## 2022-03-25 VITALS — BP 130/80 | HR 80 | Temp 98.2°F | Wt 160.7 lb

## 2022-03-25 DIAGNOSIS — J4521 Mild intermittent asthma with (acute) exacerbation: Secondary | ICD-10-CM

## 2022-03-25 DIAGNOSIS — R0609 Other forms of dyspnea: Secondary | ICD-10-CM | POA: Diagnosis not present

## 2022-03-25 DIAGNOSIS — R0600 Dyspnea, unspecified: Secondary | ICD-10-CM | POA: Diagnosis not present

## 2022-03-25 DIAGNOSIS — J45909 Unspecified asthma, uncomplicated: Secondary | ICD-10-CM | POA: Diagnosis not present

## 2022-03-25 MED ORDER — FLUTICASONE-SALMETEROL 250-50 MCG/ACT IN AEPB: 1.0000 | INHALATION_SPRAY | Freq: Two times a day (BID) | RESPIRATORY_TRACT | 2 refills | Status: DC

## 2022-03-25 NOTE — Progress Notes (Signed)
Established Patient Office Visit     CC/Reason for Visit: Discuss wheezing  HPI: Kristy [name] is a 81 y.o. female who is coming in today for the above mentioned reasons. Past Medical History is significant for: Mild intermittent asthma.  She was seen beginning of May for the same.  It appears she had been out of her inhaled steroid/LABA combination.  For some reason it appears that she has not yet been able to obtain this.  Her dyspnea on exertion has progressed.  She is now feeling very fatigued.  She is now coughing more and bringing up sputum that has turned yellow.  She denies chest discomfort.   Past Medical/Surgical History: Past Medical History:  Diagnosis Date   Allergic rhinitis, seasonal    Arthritis    Asthma    bronchial with weather change   Diabetes mellitus without complication (HCC)    type 2    Dysrhythmia    "Skipping a beat"   GERD (gastroesophageal reflux disease)    History of colon polyps    Hypertension    Pneumonia    Rotator cuff tear, right     Past Surgical History:  Procedure Laterality Date   BACK SURGERY     x3  ruptured disc    CHOLECYSTECTOMY OPEN  AGE 31   EYE SURGERY     bil cataracts   HERNIA REPAIR     INGUINAL HERNIA REPAIR Right AGE 31s   KNEE ARTHROSCOPY Left 2014   LUMBAR DISC SURGERY  x2  LAST DATE EARLY 2000s   ORIF ANKLE FRACTURE Right 06/24/2018   Procedure: OPEN REDUCTION INTERNAL FIXATION (ORIF) RIGHT ANKLE FRACTURE;  Surgeon: Kathryne Hitch, MD;  Location: [facility];  Service: Orthopedics;  Laterality: Right;   ROTATOR CUFF REPAIR Left 01/2015   TONSILLECTOMY  AGE 75   TOTAL KNEE ARTHROPLASTY Right 12/09/2020   Procedure: TOTAL KNEE ARTHROPLASTY;  Surgeon: Ollen Gross, MD;  Location: [facility];  Service: Orthopedics;  Laterality: Right;    VAGINAL HYSTERECTOMY  AGE 70   WITH BSO    Social History:  reports that she quit smoking about 33 years ago. Her smoking use included cigarettes. She has never used  smokeless tobacco. She reports current alcohol use of about 3.0 - 4.0 standard drinks per week. She reports that she does not use drugs.  Allergies: Allergies  Allergen Reactions   Clonidine Hives    Family History:  Family History  Problem Relation Age of Onset   Colon cancer Neg Hx    Esophageal cancer Neg Hx    Rectal cancer Neg Hx    Stomach cancer Neg Hx      Current Outpatient Medications:    albuterol (VENTOLIN HFA) 108 (90 Base) MCG/ACT inhaler, INHALE 2 PUFFS BY MOUTH EVERY 6 HOURS AS NEEDED FOR WHEEZING, Disp: 6.7 each, Rfl: 1   amLODipine (NORVASC) 10 MG tablet, TAKE 1 TABLET BY MOUTH EVERY DAY, Disp: 90 tablet, Rfl: 0   benazepril (LOTENSIN) 40 MG tablet, TAKE 1 TABLET BY MOUTH EVERY DAY, Disp: 90 tablet, Rfl: 1   celecoxib (CELEBREX) 200 MG capsule, TAKE 1 CAPSULE BY MOUTH EVERY DAY, Disp: 90 capsule, Rfl: 0   hydrochlorothiazide (HYDRODIURIL) 12.5 MG tablet, TAKE 1 TABLET (12.5 MG TOTAL) BY MOUTH AS NEEDED. FOR SWELLING, Disp: 90 tablet, Rfl: 3   metFORMIN (GLUCOPHAGE) 500 MG tablet, TAKE 1 TABLET BY MOUTH 2 TIMES DAILY WITH A MEAL., Disp: 180 tablet, Rfl: 1  metoprolol succinate (TOPROL-XL) 50 MG 24 hr tablet, Take 1 tablet (50 mg total) by mouth daily. Patient need a appointment for future refills.., Disp: 90 tablet, Rfl: 0   fluticasone-salmeterol (WIXELA INHUB) 250-50 MCG/ACT AEPB, Inhale 1 puff into the lungs in the morning and at bedtime., Disp: 1 each, Rfl: 2  Review of Systems:  Constitutional: Denies fever, chills, diaphoresis, appetite change. HEENT: Denies photophobia, eye pain, redness, hearing loss, mouth sores, trouble swallowing, neck pain, neck stiffness and tinnitus.   Respiratory: Denies  chest tightness. Cardiovascular: Denies chest pain, palpitations and leg swelling.  Gastrointestinal: Denies nausea, vomiting, abdominal pain, diarrhea, constipation, blood in stool and abdominal distention.  Genitourinary: Denies dysuria, urgency, frequency,  hematuria, flank pain and difficulty urinating.  Endocrine: Denies: hot or cold intolerance, sweats, changes in hair or nails, polyuria, polydipsia. Musculoskeletal: Denies myalgias, back pain, joint swelling, arthralgias and gait problem.  Skin: Denies pallor, rash and wound.  Neurological: Denies dizziness, seizures, syncope, weakness, light-headedness, numbness and headaches.  Hematological: Denies adenopathy. Easy bruising, personal or family bleeding history  Psychiatric/Behavioral: Denies suicidal ideation, mood changes, confusion, nervousness, sleep disturbance and agitation    Physical Exam: Vitals:   03/25/22 1601  BP: 130/80  Pulse: 80  Temp: 98.2 F (36.8 C)  TempSrc: Oral  SpO2: 97%  Weight: 160 lb 11.2 oz (72.9 kg)    Body mass index is 31.38 kg/m.   Constitutional: NAD, calm, comfortable Eyes: PERRL, lids and conjunctivae normal ENMT: Mucous membranes are moist.  Respiratory: Mild expiratory wheezes, significant bilateral rhonchi.  Normal respiratory effort. No accessory muscle use.  Cardiovascular: Regular rate and rhythm, no murmurs / rubs / gallops. No extremity edema.  Psychiatric: Normal judgment and insight. Alert and oriented x 3. Normal mood.    Impression and Plan:  DOE (dyspnea on exertion) - Plan: DG Chest 2 View  Mild intermittent asthma with acute exacerbation - Plan: fluticasone-salmeterol (WIXELA INHUB) 250-50 MCG/ACT AEPB  -Inhaled steroid/LABA has again been prescribed. -I would like to exclude possibility of bacterial pneumonia with a chest x-ray. -She will continue antihistamines as she believes her seasonal allergies have flared up.    Time spent:33 minutes reviewing chart, interviewing and examining patient and formulating plan of care.     Chaya Jan, MD [facility]

## 2022-03-26 ENCOUNTER — Other Ambulatory Visit: Payer: Self-pay | Admitting: Internal Medicine

## 2022-03-26 DIAGNOSIS — E119 Type 2 diabetes mellitus without complications: Secondary | ICD-10-CM

## 2022-03-30 DIAGNOSIS — Z1231 Encounter for screening mammogram for malignant neoplasm of breast: Secondary | ICD-10-CM | POA: Diagnosis not present

## 2022-03-30 LAB — HM MAMMOGRAPHY

## 2022-04-06 ENCOUNTER — Encounter: Payer: Self-pay | Admitting: Internal Medicine

## 2022-04-06 DIAGNOSIS — D649 Anemia, unspecified: Secondary | ICD-10-CM | POA: Diagnosis not present

## 2022-04-06 DIAGNOSIS — Z79899 Other long term (current) drug therapy: Secondary | ICD-10-CM | POA: Diagnosis not present

## 2022-04-06 DIAGNOSIS — M1991 Primary osteoarthritis, unspecified site: Secondary | ICD-10-CM | POA: Diagnosis not present

## 2022-04-06 DIAGNOSIS — M154 Erosive (osteo)arthritis: Secondary | ICD-10-CM | POA: Diagnosis not present

## 2022-04-06 DIAGNOSIS — Z6830 Body mass index (BMI) 30.0-30.9, adult: Secondary | ICD-10-CM | POA: Diagnosis not present

## 2022-04-06 DIAGNOSIS — E669 Obesity, unspecified: Secondary | ICD-10-CM | POA: Diagnosis not present

## 2022-04-06 NOTE — Progress Notes (Signed)
[facility] 

## 2022-04-07 ENCOUNTER — Telehealth: Payer: Self-pay | Admitting: Internal Medicine

## 2022-04-07 NOTE — Telephone Encounter (Signed)
Pt called to say the pharmacy will not give her the fluticasone-salmeterol (WIXELA INHUB) 250-50 MCG/ACT AEPB   She was told to call [employer]. She called and [employer] told her they faxed an approval request form here, 3 or more times, to no avail. She has yet to start this Rx, since being prescribed.  [employer] [phone] Case # [id number]  Please send to: [id number] [address], [place] - [address] Phone:  [phone]  Fax:  [phone]

## 2022-04-08 ENCOUNTER — Encounter: Payer: Self-pay | Admitting: Family Medicine

## 2022-04-08 ENCOUNTER — Ambulatory Visit (INDEPENDENT_AMBULATORY_CARE_PROVIDER_SITE_OTHER): Payer: Medicare Other | Admitting: Family Medicine

## 2022-04-08 VITALS — BP 130/80 | HR 89 | Resp 20 | Ht 60.0 in | Wt 164.4 lb

## 2022-04-08 DIAGNOSIS — K921 Melena: Secondary | ICD-10-CM

## 2022-04-08 DIAGNOSIS — R0609 Other forms of dyspnea: Secondary | ICD-10-CM

## 2022-04-08 DIAGNOSIS — K219 Gastro-esophageal reflux disease without esophagitis: Secondary | ICD-10-CM | POA: Diagnosis not present

## 2022-04-08 DIAGNOSIS — J4521 Mild intermittent asthma with (acute) exacerbation: Secondary | ICD-10-CM | POA: Diagnosis not present

## 2022-04-08 DIAGNOSIS — D649 Anemia, unspecified: Secondary | ICD-10-CM

## 2022-04-08 LAB — CBC WITH DIFFERENTIAL/PLATELET
Basophils Absolute: 0.1 10*3/uL (ref 0.0–0.1)
Basophils Relative: 1.2 % (ref 0.0–3.0)
Eosinophils Absolute: 0.4 10*3/uL (ref 0.0–0.7)
Eosinophils Relative: 4.7 % (ref 0.0–5.0)
HCT: 30 % — ABNORMAL LOW (ref 36.0–46.0)
Hemoglobin: 9.3 g/dL — ABNORMAL LOW (ref 12.0–15.0)
Lymphocytes Relative: 30.3 % (ref 12.0–46.0)
Lymphs Abs: 2.3 10*3/uL (ref 0.7–4.0)
MCHC: 31.1 g/dL (ref 30.0–36.0)
MCV: 71.3 fl — ABNORMAL LOW (ref 78.0–100.0)
Monocytes Absolute: 0.6 10*3/uL (ref 0.1–1.0)
Monocytes Relative: 7.2 % (ref 3.0–12.0)
Neutro Abs: 4.4 10*3/uL (ref 1.4–7.7)
Neutrophils Relative %: 56.6 % (ref 43.0–77.0)
Platelets: 507 10*3/uL — ABNORMAL HIGH (ref 150.0–400.0)
RBC: 4.2 Mil/uL (ref 3.87–5.11)
RDW: 19 % — ABNORMAL HIGH (ref 11.5–15.5)
WBC: 7.7 10*3/uL (ref 4.0–10.5)

## 2022-04-08 LAB — IRON: Iron: 484 ug/dL — ABNORMAL HIGH (ref 42–145)

## 2022-04-08 LAB — POCT HEMOGLOBIN: Hemoglobin: 8.3 g/dL — AB (ref 11–14.6)

## 2022-04-08 LAB — FERRITIN: Ferritin: 10.6 ng/mL (ref 10.0–291.0)

## 2022-04-08 MED ORDER — FERROUS SULFATE 325 (65 FE) MG PO TABS: 325.0000 mg | ORAL_TABLET | Freq: Two times a day (BID) | ORAL | 3 refills | Status: DC

## 2022-04-08 MED ORDER — BREZTRI AEROSPHERE 160-9-4.8 MCG/ACT IN AERO: 2.0000 | INHALATION_SPRAY | Freq: Two times a day (BID) | RESPIRATORY_TRACT | 2 refills | Status: DC

## 2022-04-08 MED ORDER — OMEPRAZOLE 20 MG PO CPDR: 20.0000 mg | DELAYED_RELEASE_CAPSULE | Freq: Every day | ORAL | 3 refills | Status: DC

## 2022-04-08 MED ORDER — IPRATROPIUM-ALBUTEROL 0.5-2.5 (3) MG/3ML IN SOLN
3.0000 mL | Freq: Once | RESPIRATORY_TRACT | Status: AC
  Administered 2022-04-08: 1.5 mL via RESPIRATORY_TRACT

## 2022-04-08 NOTE — Progress Notes (Signed)
ACUTE VISIT Chief Complaint  Patient presents with   Follow-up    Seen by rheumatologist yesterday, hemoglobin was 7.9, they stopped the celebrex for now.   HPI: Kristy [name] is a 81 y.o. female with hx of HTN,DM II,asthma,GERD, and OA here today to follow on recent CBC done at her rheumatologist's office. We do not have copy of labs, she states that she received a call to let her know that her Hg was 7.9. Labs were ordered because she has been "extremely tired" and thirsty. Lab Results  Component Value Date   HGBA1C 5.7 (A) 02/23/2022   Celebrex discontinued yesterday.  Denies abdominal pain, nausea, vomiting, changes in bowel habits, blood in stool or gross hematuria.  She started taking Fe sulfate yesterday, has taken 3 tabs. GERD: She is not on PPI. 2-3 months ago she had 3 days of melena (black stools).  Colonoscopy in 03/2018.  SOB,cough,and wheezing for a few weeks. She has had DOE intermittent for a while now but worse for the past month.  Hx of asthma, she is currently on Wixela 250-50 mcg bid. Former smoker, quit 25 years ago. Cough interferes with sleep.  Negative for CP,orthopnea,PND,or worsening LE edema. She follows with cardiologist. Echo 11/2021 LVEF 55-60% and grade I diastolic dysfunction.  Review of Systems  Constitutional:  Positive for fatigue. Negative for activity change and appetite change.  HENT:  Negative for mouth sores, nosebleeds and sore throat.   Eyes:  Negative for redness and visual disturbance.  Respiratory:  Positive for cough, shortness of breath and wheezing.   Genitourinary:  Negative for decreased urine volume, dysuria and hematuria.  Skin:  Negative for rash.  Neurological:  Negative for syncope, weakness and headaches.  Hematological:  Negative for adenopathy. Does not bruise/bleed easily.  Rest see pertinent positives and negatives per HPI.  Current Outpatient Medications on File Prior to Visit  Medication Sig Dispense  Refill   albuterol (VENTOLIN HFA) 108 (90 Base) MCG/ACT inhaler INHALE 2 PUFFS BY MOUTH EVERY 6 HOURS AS NEEDED FOR WHEEZING 6.7 each 1   amLODipine (NORVASC) 10 MG tablet TAKE 1 TABLET BY MOUTH EVERY DAY 90 tablet 0   benazepril (LOTENSIN) 40 MG tablet TAKE 1 TABLET BY MOUTH EVERY DAY 90 tablet 1   hydrochlorothiazide (HYDRODIURIL) 12.5 MG tablet TAKE 1 TABLET (12.5 MG TOTAL) BY MOUTH AS NEEDED. FOR SWELLING 90 tablet 3   metFORMIN (GLUCOPHAGE) 500 MG tablet TAKE 1 TABLET BY MOUTH 2 TIMES DAILY WITH A MEAL. 180 tablet 1   metoprolol succinate (TOPROL-XL) 50 MG 24 hr tablet Take 1 tablet (50 mg total) by mouth daily. Patient need a appointment for future refills.. 90 tablet 0   No current facility-administered medications on file prior to visit.   Past Medical History:  Diagnosis Date   Allergic rhinitis, seasonal    Arthritis    Asthma    bronchial with weather change   Diabetes mellitus without complication (HCC)    type 2    Dysrhythmia    "Skipping a beat"   GERD (gastroesophageal reflux disease)    History of colon polyps    Hypertension    Pneumonia    Rotator cuff tear, right    Allergies  Allergen Reactions   Clonidine Hives    Social History   Socioeconomic History   Marital status: Widowed    Spouse name: Not on file   Number of children: Not on file   Years of education: Not on file  Highest education level: Not on file  Occupational History   Occupation: retired  Tobacco Use   Smoking status: Former    Years: 10.00    Types: Cigarettes    Quit date: 10/19/1988    Years since quitting: 33.4   Smokeless tobacco: Never  Vaping Use   Vaping Use: Never used  Substance and Sexual Activity   Alcohol use: Yes    Alcohol/week: 3.0 - 4.0 standard drinks of alcohol    Types: 3 - 4 Glasses of wine per week    Comment: 2-3 times a week   Drug use: Never   Sexual activity: Not Currently  Other Topics Concern   Not on file  Social History Narrative   Not on  file   Social Determinants of Health   Financial Resource Strain: Low Risk  (05/15/2021)   Overall Financial Resource Strain (CARDIA)    Difficulty of Paying Living Expenses: Not hard at all  Food Insecurity: No Food Insecurity (05/15/2021)   Hunger Vital Sign    Worried About Running Out of Food in the Last Year: Never true    Ran Out of Food in the Last Year: Never true  Transportation Needs: No Transportation Needs (05/15/2021)   PRAPARE - Administrator, Civil Service (Medical): No    Lack of Transportation (Non-Medical): No  Physical Activity: Not on file  Stress: No Stress Concern Present (05/15/2021)   [employer] of Occupational Health - Occupational Stress Questionnaire    Feeling of Stress : Not at all  Social Connections: Not on file   Vitals:   04/08/22 1427  BP: 130/80  Pulse: 89  Resp: 20  SpO2: 97%   Body mass index is 32.1 kg/m.  Physical Exam Vitals and nursing note reviewed.  Constitutional:      General: She is not in acute distress.    Appearance: She is well-developed.  HENT:     Head: Normocephalic and atraumatic.     Mouth/Throat:     Mouth: Mucous membranes are moist.     Pharynx: Oropharynx is clear.  Eyes:     Conjunctiva/sclera: Conjunctivae normal.  Cardiovascular:     Rate and Rhythm: Normal rate and regular rhythm.     Heart sounds: No murmur heard.    Comments: 1+ pitting RLE and ankle edema, trace pitting edema LLE. Pulmonary:     Effort: Pulmonary effort is normal. No respiratory distress.     Breath sounds: Wheezing present. No rhonchi or rales.  Abdominal:     Palpations: Abdomen is soft. There is no hepatomegaly or mass.     Tenderness: There is no abdominal tenderness.  Genitourinary:    Comments: Rectal examination and guaiac deferred to GI Lymphadenopathy:     Cervical: No cervical adenopathy.  Skin:    General: Skin is warm.     Findings: No erythema or rash.  Neurological:     General: No focal deficit  present.     Mental Status: She is alert and oriented to person, place, and time.     Cranial Nerves: No cranial nerve deficit.     Gait: Gait normal.  Psychiatric:     Comments: Well groomed, good eye contact.   ASSESSMENT AND PLAN:  Kristy [name] was seen today for follow-up.  Diagnoses and all orders for this visit: Orders Placed This Encounter  Procedures   CBC with Differential/Platelet   Iron   Ferritin   Ambulatory referral to Gastroenterology   POC Hemoglobin  Lab Results  Component Value Date   WBC 7.7 04/08/2022   HGB 9.3 (L) 04/08/2022   HCT 30.0 (L) 04/08/2022   MCV 71.3 (L) 04/08/2022   PLT 507.0 (H) 04/08/2022   Anemia, unspecified type Most likely iron def, it seems to be getting worse. Hg was 11.4 in 09/2021. HemoCue 8.3. We discussed possible etiologies. Continue Ferrous sulfate 325 mg bid , if constipation, she can decrease to 1 tab daily. Further recommendations according to lab results.  -     ferrous sulfate 325 (65 FE) MG tablet; Take 1 tablet (325 mg total) by mouth 2 (two) times daily with a meal.  DOE (dyspnea on exertion) This seems to be a chronic problem. We discussed possible etiologies. Some of her chronic medical conditions could be contributing factor. I do not think we need to repeat imaging at this time. Instructed about warning signs.  Mild intermittent asthma with acute exacerbation Symptoms are not well controlled. After verbal consent she received DuoNeb neb treatment here in the office, she tolerated it well. ?  Asthma with COPD. Lung auscultation after breathing treatment: Improvement of ventilation, mild wheezing, no rales or rhonchi.  Grant Ruts is not helping and she is running out of her prescription, she is also having difficulty with coverage. She agrees with trying samples of Breztri to use 2 puff bid, she knows to swish after use.. F/U in 2 weeks,before if needed.  -     ipratropium-albuterol (DUONEB) 0.5-2.5 (3) MG/3ML  nebulizer solution 3 mL -     Budeson-Glycopyrrol-Formoterol (BREZTRI AEROSPHERE) 160-9-4.8 MCG/ACT AERO; Inhale 2 puffs into the lungs 2 (two) times daily.  Gastrointestinal hemorrhage with melena Currently she is not having melena but did so 2-3 months ago. GI referral placed. Instructed about warning signs.  Gastroesophageal reflux disease without esophagitis Seems to be asymptomatic but because reporting melena, recommend course of Omeprazole x 6-8 weeks. GERD precautions.  -     omeprazole (PRILOSEC) 20 MG capsule; Take 1 capsule (20 mg total) by mouth daily.  High iron levels: With associated microcytic anemia, no known hx of hemoglobinopathies. F/U in 2 weeks with pcp.  I spent a total of 41 minutes in both face to face and non face to face activities for this visit (excluding time of neb treatment) on the date of this encounter. During this time history was obtained and documented, examination was performed, prior labs/imaging reviewed, and assessment/plan discussed.  Return in about 2 weeks (around 04/22/2022) for with PCP for anemia and wheezing/SOB/cough.  Edwin Cherian G. [place], MD  [facility]. [place] office.

## 2022-04-08 NOTE — Telephone Encounter (Signed)
PA form completed, faxed, and confirmed with last office note.

## 2022-04-08 NOTE — Patient Instructions (Addendum)
A few things to remember from today's visit:  Anemia, unspecified type - Plan: CBC with Differential/Platelet, Iron, Ferritin, Ambulatory referral to Gastroenterology, POC Hemoglobin, ferrous sulfate 325 (65 FE) MG tablet  Mild intermittent asthma with acute exacerbation - Plan: ipratropium-albuterol (DUONEB) 0.5-2.5 (3) MG/3ML nebulizer solution 3 mL  Gastrointestinal hemorrhage with melena - Plan: Ambulatory referral to Gastroenterology  Gastroesophageal reflux disease without esophagitis - Plan: omeprazole (PRILOSEC) 20 MG capsule  If you need refills please call your pharmacy. Do not use My Chart to request refills or for acute issues that need immediate attention.   Stop Wixela 250-50 mcg bid and start samples of Breztri. I am treating for possible combination of COPD and asthma. Omeprazole 20 mg daily 30 min before breakfast. Continue iron 2 times daily, if poor tolerance you can take it once daily.  Please be sure medication list is accurate. If a new problem present, please set up appointment sooner than planned today.

## 2022-04-09 ENCOUNTER — Encounter: Payer: Self-pay | Admitting: Internal Medicine

## 2022-04-09 DIAGNOSIS — M84371A Stress fracture, right ankle, initial encounter for fracture: Secondary | ICD-10-CM | POA: Diagnosis not present

## 2022-04-09 DIAGNOSIS — M76829 Posterior tibial tendinitis, unspecified leg: Secondary | ICD-10-CM | POA: Diagnosis not present

## 2022-04-09 DIAGNOSIS — M79671 Pain in right foot: Secondary | ICD-10-CM | POA: Diagnosis not present

## 2022-04-09 DIAGNOSIS — M722 Plantar fascial fibromatosis: Secondary | ICD-10-CM | POA: Diagnosis not present

## 2022-04-09 LAB — IRON,TIBC AND FERRITIN PANEL
%SAT: 4
Iron: 20
TIBC: 479
UIBC: 459

## 2022-04-14 ENCOUNTER — Encounter: Payer: Self-pay | Admitting: Internal Medicine

## 2022-04-22 ENCOUNTER — Ambulatory Visit (INDEPENDENT_AMBULATORY_CARE_PROVIDER_SITE_OTHER): Payer: Medicare Other | Admitting: Internal Medicine

## 2022-04-22 ENCOUNTER — Encounter: Payer: Self-pay | Admitting: Internal Medicine

## 2022-04-22 VITALS — BP 100/60 | HR 95 | Temp 97.9°F

## 2022-04-22 DIAGNOSIS — I10 Essential (primary) hypertension: Secondary | ICD-10-CM | POA: Diagnosis not present

## 2022-04-22 DIAGNOSIS — J4521 Mild intermittent asthma with (acute) exacerbation: Secondary | ICD-10-CM | POA: Diagnosis not present

## 2022-04-22 DIAGNOSIS — D509 Iron deficiency anemia, unspecified: Secondary | ICD-10-CM

## 2022-04-22 MED ORDER — BUDESONIDE-FORMOTEROL FUMARATE 160-4.5 MCG/ACT IN AERO: 2.0000 | INHALATION_SPRAY | Freq: Two times a day (BID) | RESPIRATORY_TRACT | 3 refills | Status: DC

## 2022-04-22 NOTE — Progress Notes (Signed)
Established Patient Office Visit     CC/Reason for Visit: Follow-up labs, medication management  HPI: Kristy [name] is a 81 y.o. female who is coming in today for the above mentioned reasons.  She has been having issues with her asthma ever since insurance denied to cover her Wixela.  She was given samples of Brextri and feels like her shortness of breath has improved.  Insurance will cover Symbicort and she is requesting a change.  She was also seen recently by her rheumatologist and she was found to be anemic.  She was asked to discontinue Celebrex and is following up with me.  She has signs of iron deficiency with a microcytic anemia (hb 9.3), elevated platelets, elevated RDW and a ferritin of 10.6.  She has an appointment scheduled to see GI in August.  She last had a colonoscopy in 2019 that showed internal hemorrhoids and some diverticula.  She has not noted any GI bleeding.  Past Medical/Surgical History: Past Medical History:  Diagnosis Date   Allergic rhinitis, seasonal    Arthritis    Asthma    bronchial with weather change   Diabetes mellitus without complication (HCC)    type 2    Dysrhythmia    "Skipping a beat"   GERD (gastroesophageal reflux disease)    History of colon polyps    Hypertension    Pneumonia    Rotator cuff tear, right     Past Surgical History:  Procedure Laterality Date   BACK SURGERY     x3  ruptured disc    CHOLECYSTECTOMY OPEN  AGE 4   EYE SURGERY     bil cataracts   HERNIA REPAIR     INGUINAL HERNIA REPAIR Right AGE 4s   KNEE ARTHROSCOPY Left 2014   LUMBAR DISC SURGERY  x2  LAST DATE EARLY 2000s   ORIF ANKLE FRACTURE Right 06/24/2018   Procedure: OPEN REDUCTION INTERNAL FIXATION (ORIF) RIGHT ANKLE FRACTURE;  Surgeon: Kathryne Hitch, MD;  Location: [facility];  Service: Orthopedics;  Laterality: Right;   ROTATOR CUFF REPAIR Left 01/2015   TONSILLECTOMY  AGE 36   TOTAL KNEE ARTHROPLASTY Right 12/09/2020   Procedure: TOTAL KNEE  ARTHROPLASTY;  Surgeon: Ollen Gross, MD;  Location: [facility];  Service: Orthopedics;  Laterality: Right;    VAGINAL HYSTERECTOMY  AGE 72   WITH BSO    Social History:  reports that she quit smoking about 33 years ago. Her smoking use included cigarettes. She has never used smokeless tobacco. She reports current alcohol use of about 3.0 - 4.0 standard drinks of alcohol per week. She reports that she does not use drugs.  Allergies: Allergies  Allergen Reactions   Clonidine Hives    Family History:  Family History  Problem Relation Age of Onset   Colon cancer Neg Hx    Esophageal cancer Neg Hx    Rectal cancer Neg Hx    Stomach cancer Neg Hx      Current Outpatient Medications:    albuterol (VENTOLIN HFA) 108 (90 Base) MCG/ACT inhaler, INHALE 2 PUFFS BY MOUTH EVERY 6 HOURS AS NEEDED FOR WHEEZING, Disp: 6.7 each, Rfl: 1   amLODipine (NORVASC) 10 MG tablet, TAKE 1 TABLET BY MOUTH EVERY DAY, Disp: 90 tablet, Rfl: 0   benazepril (LOTENSIN) 40 MG tablet, TAKE 1 TABLET BY MOUTH EVERY DAY, Disp: 90 tablet, Rfl: 1   Budeson-Glycopyrrol-Formoterol (BREZTRI AEROSPHERE) 160-9-4.8 MCG/ACT AERO, Inhale 2 puffs into the lungs 2 (two)  times daily., Disp: 10.7 g, Rfl: 2   budesonide-formoterol (SYMBICORT) 160-4.5 MCG/ACT inhaler, Inhale 2 puffs into the lungs 2 (two) times daily., Disp: 1 each, Rfl: 3   ferrous sulfate 325 (65 FE) MG tablet, Take 1 tablet (325 mg total) by mouth 2 (two) times daily with a meal., Disp: 60 tablet, Rfl: 3   hydrochlorothiazide (HYDRODIURIL) 12.5 MG tablet, TAKE 1 TABLET (12.5 MG TOTAL) BY MOUTH AS NEEDED. FOR SWELLING, Disp: 90 tablet, Rfl: 3   metFORMIN (GLUCOPHAGE) 500 MG tablet, TAKE 1 TABLET BY MOUTH 2 TIMES DAILY WITH A MEAL., Disp: 180 tablet, Rfl: 1   metoprolol succinate (TOPROL-XL) 50 MG 24 hr tablet, Take 1 tablet (50 mg total) by mouth daily. Patient need a appointment for future refills.., Disp: 90 tablet, Rfl: 0   omeprazole (PRILOSEC) 20 MG capsule,  Take 1 capsule (20 mg total) by mouth daily., Disp: 30 capsule, Rfl: 3  Review of Systems:  Constitutional: Denies fever, chills, diaphoresis, appetite change. HEENT: Denies photophobia, eye pain, redness, hearing loss, ear pain, congestion, sore throat, rhinorrhea, sneezing, mouth sores, trouble swallowing, neck pain, neck stiffness and tinnitus.   Respiratory: Denies SOB, DOE, cough, chest tightness,  and wheezing.   Cardiovascular: Denies chest pain, palpitations and leg swelling.  Gastrointestinal: Denies nausea, vomiting, abdominal pain, diarrhea, constipation, blood in stool and abdominal distention.  Genitourinary: Denies dysuria, urgency, frequency, hematuria, flank pain and difficulty urinating.  Endocrine: Denies: hot or cold intolerance, sweats, changes in hair or nails, polyuria, polydipsia. Musculoskeletal: Denies myalgias, back pain, joint swelling, arthralgias and gait problem.  Skin: Denies pallor, rash and wound.  Neurological: Denies dizziness, seizures, syncope, weakness, light-headedness, numbness and headaches.  Hematological: Denies adenopathy. Easy bruising, personal or family bleeding history  Psychiatric/Behavioral: Denies suicidal ideation, mood changes, confusion, nervousness, sleep disturbance and agitation    Physical Exam: Vitals:   04/22/22 1337  BP: 100/60  Pulse: 95  Temp: 97.9 F (36.6 C)  TempSrc: Oral  SpO2: 97%    There is no height or weight on file to calculate BMI.   Constitutional: NAD, calm, comfortable Eyes: PERRL, lids and conjunctivae normal ENMT: Mucous membranes are moist. Respiratory: clear to auscultation bilaterally, no wheezing, no crackles. Normal respiratory effort. No accessory muscle use.  Cardiovascular: Regular rate and rhythm, no murmurs / rubs / gallops. No extremity edema.  Psychiatric: Normal judgment and insight. Alert and oriented x 3. Normal mood.    Impression and Plan:  Iron deficiency anemia, unspecified iron  deficiency anemia type -Resume ferrous sulfate 325 mg twice daily, has appointment with GI scheduled for August.  Previous colonoscopy with hemorrhoids that is likely the etiology of her iron deficiency.  Mild intermittent asthma with acute exacerbation  - Plan: budesonide-formoterol (SYMBICORT) 160-4.5 MCG/ACT inhaler  Essential hypertension -Well-controlled.    Time spent:32 minutes reviewing chart, interviewing and examining patient and formulating plan of care.   Patient Instructions         Yishai Rehfeld Philip Aspen, MD [facility]

## 2022-04-23 ENCOUNTER — Ambulatory Visit: Payer: Self-pay

## 2022-04-23 DIAGNOSIS — I10 Essential (primary) hypertension: Secondary | ICD-10-CM

## 2022-04-23 DIAGNOSIS — E119 Type 2 diabetes mellitus without complications: Secondary | ICD-10-CM

## 2022-04-23 NOTE — Chronic Care Management (AMB) (Signed)
Chronic Care Management   CCM RN Visit Note  04/23/2022 Name: Kristy [name] MRN: [id number] DOB: 11/10/1940  Subjective: Kristy [name] is a 81 y.o. year old female who is a primary care patient of Isaac Bliss, Rayford Halsted, MD. The care management team was consulted for assistance with disease management and care coordination needs.    Engaged with patient by telephone for  Case closed goals met  in response to provider referral for case management and/or care coordination services.   Consent to Services:  The patient was given information about Chronic Care Management services, agreed to services, and gave verbal consent prior to initiation of services.  Please see initial visit note for detailed documentation.   Patient agreed to services and verbal consent obtained.   Assessment: Review of patient past medical history, allergies, medications, health status, including review of consultants reports, laboratory and other test data, was performed as part of comprehensive evaluation and provision of chronic care management services.   SDOH (Social Determinants of Health) assessments and interventions performed:    CCM Care Plan  Allergies  Allergen Reactions   Clonidine Hives    Outpatient Encounter Medications as of 04/23/2022  Medication Sig   albuterol (VENTOLIN HFA) 108 (90 Base) MCG/ACT inhaler INHALE 2 PUFFS BY MOUTH EVERY 6 HOURS AS NEEDED FOR WHEEZING   amLODipine (NORVASC) 10 MG tablet TAKE 1 TABLET BY MOUTH EVERY DAY   benazepril (LOTENSIN) 40 MG tablet TAKE 1 TABLET BY MOUTH EVERY DAY   Budeson-Glycopyrrol-Formoterol (BREZTRI AEROSPHERE) 160-9-4.8 MCG/ACT AERO Inhale 2 puffs into the lungs 2 (two) times daily.   budesonide-formoterol (SYMBICORT) 160-4.5 MCG/ACT inhaler Inhale 2 puffs into the lungs 2 (two) times daily.   ferrous sulfate 325 (65 FE) MG tablet Take 1 tablet (325 mg total) by mouth 2 (two) times daily with a meal.   hydrochlorothiazide (HYDRODIURIL) 12.5 MG  tablet TAKE 1 TABLET (12.5 MG TOTAL) BY MOUTH AS NEEDED. FOR SWELLING   metFORMIN (GLUCOPHAGE) 500 MG tablet TAKE 1 TABLET BY MOUTH 2 TIMES DAILY WITH A MEAL.   metoprolol succinate (TOPROL-XL) 50 MG 24 hr tablet Take 1 tablet (50 mg total) by mouth daily. Patient need a appointment for future refills.Marland Kitchen   omeprazole (PRILOSEC) 20 MG capsule Take 1 capsule (20 mg total) by mouth daily.   No facility-administered encounter medications on file as of 04/23/2022.    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) 02/28/2021   Osteoarthritis of right knee 12/09/2020   Hypokalemia 03/07/2020   Type 2 diabetes mellitus without complication ([place]) 22/48/2500   IGT (impaired glucose tolerance) 03/14/2019   Acute labyrinthitis, unspecified laterality 09/08/2018   Trimalleolar fracture of right ankle 06/24/2018   Closed trimalleolar fracture of right ankle 06/21/2018   OA (osteoarthritis) of knee 09/22/2012   UNSPECIFIED DISORDER TEETH&SUPPORTING STRUCTURES 09/09/2010   Essential hypertension 04/07/2007   Allergic rhinitis 04/07/2007   Asthma 04/07/2007   GERD 04/07/2007   COLONIC POLYPS, HX OF 04/07/2007    Conditions to be addressed/monitored:HTN and DMII  Care Plan : RN Care Manager Plan of Care  Updates made by Dimitri Ped, RN since 04/23/2022 12:00 AM  Completed 04/23/2022   Problem: Chronic Disease Management and Care Coordination Needs (HTN, DM, hx falls) Resolved 04/23/2022  Priority: High     Long-Range Goal: Establish Plan of Care for Chronic Disease Management Needs (HTN, DM, hx  falls) Completed 04/23/2022  Start Date: 11/14/2021  Expected End Date: 11/11/2022  Priority: High  Note:   Case closed goals met Current Barriers:  Chronic Disease Management support and education needs related to HTN, DMII, and Osteoarthritis  States that she saw ENT today and her saliva glands are  healed.  Denies any issues with swallowing.  States her B/P has been good and was 135/73 today.  States she does not check her blood sugars since she is well controled.  States she tries to follow a healthy low sodium diet.  States she is walking her dog every day for 30-60 minutes and she has started back with dancing. Denies any falls and denies any pain.  RNCM Clinical Goal(s):  Patient will verbalize understanding of plan for management of HTN, DMII, and Osteoarthritis as evidenced by voiced adherence to plan of care verbalize basic understanding of  HTN, DMII, and Osteoarthritis disease process and self health management plan as evidenced by voiced understanding and teach back take all medications exactly as prescribed and will call provider for medication related questions as evidenced by dispense report and pt verbalization attend all scheduled medical appointments: none scheduled at this time as evidenced by medical records demonstrate Ongoing adherence to prescribed treatment plan for HTN, DMII, and Osteoarthritis as evidenced by readings within limits, voiced adherence to plan of care continue to work with RN Care Manager to address care management and care coordination needs related to  HTN, DMII, and Osteoarthritis as evidenced by adherence to CM Team Scheduled appointments through collaboration with RN Care manager, provider, and care team.   Interventions: 1:1 collaboration with primary care provider regarding development and update of comprehensive plan of care as evidenced by provider attestation and co-signature Inter-disciplinary care team collaboration (see longitudinal plan of care) Evaluation of current treatment plan related to  self management and patient's adherence to plan as established by provider   Diabetes Interventions:  (Status:  Goal Met.) Long Term Goal Assessed patient's understanding of A1c goal: <6.5% Provided education to patient about basic DM disease  process Reviewed medications with patient and discussed importance of medication adherence Counseled on importance of regular laboratory monitoring as prescribed Reinforced to get regular exercise to help keep blood sugars under control Lab Results  Component Value Date   HGBA1C 5.8 (A) 09/03/2021   Falls Interventions:  (Status:  Goal Met.) Long Term Goal Reviewed medications and discussed potential side effects of medications such as dizziness and frequent urination Advised patient of importance of notifying provider of falls Assessed for falls since last encounter Assessed patients knowledge of fall risk prevention secondary to previously provided education Reviewed to continue to exercise to help maintain her balance   Hypertension Interventions:  (Status:  Goal Met.) Long Term Goal Last practice recorded BP readings:  BP Readings from Last 3 Encounters:  11/13/21 126/78  09/23/21 (!) 160/80  09/12/21 140/89  Most recent eGFR/CrCl: No results found for: EGFR  No components found for: CRCL  Evaluation of current treatment plan related to hypertension self management and patient's adherence to plan as established by provider Provided education to patient re: stroke prevention, s/s of heart attack and stroke Reviewed medications with patient and discussed importance of compliance Counseled on the importance of exercise goals with target of 150 minutes per week Discussed plans with patient for ongoing care management follow up and provided patient with direct contact information for care management team Advised patient, providing education and rationale, to monitor blood pressure daily  and record, calling PCP for findings outside established parameters Provided education on prescribed diet low sodium low CHO Reviewed benefits of weight loss on B/P  Patient Goals/Self-Care Activities: Take all medications as prescribed Attend all scheduled provider appointments Call pharmacy for  medication refills 3-7 days in advance of running out of medications Perform all self care activities independently  Perform IADL's (shopping, preparing meals, housekeeping, managing finances) independently Call provider office for new concerns or questions  keep appointment with eye doctor check feet daily for cuts, sores or redness drink 6 to 8 glasses of water each day fill half of plate with vegetables limit fast food meals to no more than 1 per week manage portion size keep feet up while sitting wash and dry feet carefully every day check blood pressure 3 times per week choose a place to take my blood pressure (home, clinic or office, retail store) take blood pressure log to all doctor appointments call doctor for signs and symptoms of high blood pressure keep all doctor appointments eat more whole grains, fruits and vegetables, lean meats and healthy fats limit salt intake to 2352m/day  Follow Up Plan:  The patient has been provided with contact information for the care management team and has been advised to call with any health related questions or concerns.  No further follow up required: Case closed goals met       Plan:The patient has been provided with contact information for the care management team and has been advised to call with any health related questions or concerns.  No further follow up required: Case closed goals met MPeter GarterRN, BWell[facility] CDE Care Management Coordinator [facility] ([phone]

## 2022-04-23 NOTE — Patient Instructions (Signed)
Visit Information Case closed goals met Thank you for allowing me to share the care management and care coordination services that are available to you as part of your health plan and services through your primary care provider and medical home. Please reach out to me at [phone] if the care management/care coordination team may be of assistance to you in the future.   Doniel Maiello RN, BSN,CCM, CDE Care Management Coordinator [facility] [phone]   

## 2022-05-06 ENCOUNTER — Other Ambulatory Visit: Payer: Self-pay | Admitting: Internal Medicine

## 2022-05-12 DIAGNOSIS — M84371A Stress fracture, right ankle, initial encounter for fracture: Secondary | ICD-10-CM | POA: Diagnosis not present

## 2022-05-12 DIAGNOSIS — M76821 Posterior tibial tendinitis, right leg: Secondary | ICD-10-CM | POA: Diagnosis not present

## 2022-05-12 DIAGNOSIS — M722 Plantar fascial fibromatosis: Secondary | ICD-10-CM | POA: Diagnosis not present

## 2022-05-14 ENCOUNTER — Telehealth: Payer: Medicare Other

## 2022-05-20 ENCOUNTER — Encounter: Payer: Self-pay | Admitting: Internal Medicine

## 2022-05-20 ENCOUNTER — Ambulatory Visit (INDEPENDENT_AMBULATORY_CARE_PROVIDER_SITE_OTHER): Payer: Medicare Other | Admitting: Internal Medicine

## 2022-05-20 VITALS — BP 124/72 | HR 74 | Ht 60.5 in | Wt 161.0 lb

## 2022-05-20 DIAGNOSIS — D509 Iron deficiency anemia, unspecified: Secondary | ICD-10-CM | POA: Diagnosis not present

## 2022-05-20 DIAGNOSIS — R1310 Dysphagia, unspecified: Secondary | ICD-10-CM | POA: Diagnosis not present

## 2022-05-20 DIAGNOSIS — K219 Gastro-esophageal reflux disease without esophagitis: Secondary | ICD-10-CM

## 2022-05-20 DIAGNOSIS — K921 Melena: Secondary | ICD-10-CM

## 2022-05-20 DIAGNOSIS — R195 Other fecal abnormalities: Secondary | ICD-10-CM | POA: Diagnosis not present

## 2022-05-20 MED ORDER — OMEPRAZOLE 20 MG PO CPDR: 20.0000 mg | DELAYED_RELEASE_CAPSULE | Freq: Two times a day (BID) | ORAL | 3 refills | Status: DC

## 2022-05-20 NOTE — Patient Instructions (Signed)
We have sent the following medications to your pharmacy for you to pick up at your convenience:  Omeprazole - twice a day  You have been scheduled for an endoscopy. Please follow written instructions given to you at your visit today. If you use inhalers (even only as needed), please bring them with you on the day of your procedure.

## 2022-05-20 NOTE — Progress Notes (Signed)
HISTORY OF PRESENT ILLNESS:  Kristy [name] is a 81 y.o. female with past medical history as listed below.  She is sent today by her primary care provider regarding iron deficiency anemia and melena.  I have seen the patient in the past for surveillance colonoscopy with previous colonoscopy examinations 2006, 2012, and 2019.  Her most recent examination was negative for neoplasia.  Patient states that she has been having fatigue over the past year.  Blood work was checked by her rheumatologist and she was found to be anemic with a hemoglobin of 8 3.  She was told to stop Celebrex (which she has taken daily for many years) and told to start a PPI (Prilosec 20 mg daily).  She was referred to her primary care provider.  Follow-up blood work around April 08, 2022 revealed anemia with hemoglobin 9.3, microcytic indices with MCV 71.3.  Iron deficiency with ferritin level 10.6 and iron saturation 4%.  Patient's baseline hemoglobin appears to be around 12.5.  Patient tells me that over the past 6 months she will occasionally have urgency with very loose stools which are black.  This may last for a day or so and that her stools returned to normal.  She has not had this in years leading up to this year.  No abdominal pain.  No nausea or vomiting.  She does mention dysphagia.  She states that food seems to get stuck in the side of her right neck and she will need to regurgitate it up for relief.  It is not clear that she is ever had prior endoscopy.  Finally, she tells me that she was diagnosed with planter fasciitis and was prescribed meloxicam yesterday.  She takes metformin for her diabetes.  Last hemoglobin A1c 5.7  REVIEW OF SYSTEMS:  All non-GI ROS negative unless otherwise stated in the HPI except for arthritis, fatigue  Past Medical History:  Diagnosis Date   Allergic rhinitis, seasonal    Arthritis    Asthma    bronchial with weather change   Diabetes mellitus without complication (HCC)    type 2     Dysrhythmia    "Skipping a beat"   GERD (gastroesophageal reflux disease)    History of colon polyps    Hypertension    Pneumonia    Rotator cuff tear, right     Past Surgical History:  Procedure Laterality Date   BACK SURGERY     x3  ruptured disc    CHOLECYSTECTOMY OPEN  AGE 20   EYE SURGERY     bil cataracts   HERNIA REPAIR     INGUINAL HERNIA REPAIR Right AGE 20s   KNEE ARTHROSCOPY Left 2014   LUMBAR DISC SURGERY  x2  LAST DATE EARLY 2000s   ORIF ANKLE FRACTURE Right 06/24/2018   Procedure: OPEN REDUCTION INTERNAL FIXATION (ORIF) RIGHT ANKLE FRACTURE;  Surgeon: Kathryne Hitch, MD;  Location: [facility];  Service: Orthopedics;  Laterality: Right;   ROTATOR CUFF REPAIR Left 01/2015   TONSILLECTOMY  AGE 11   TOTAL KNEE ARTHROPLASTY Right 12/09/2020   Procedure: TOTAL KNEE ARTHROPLASTY;  Surgeon: Ollen Gross, MD;  Location: [facility];  Service: Orthopedics;  Laterality: Right;    VAGINAL HYSTERECTOMY  AGE 46   WITH BSO    Social History Kristy [name]  reports that she quit smoking about 33 years ago. Her smoking use included cigarettes. She has never used smokeless tobacco. She reports current alcohol use of about 3.0 - 4.0  standard drinks of alcohol per week. She reports that she does not use drugs.  family history is not on file.  Allergies  Allergen Reactions   Clonidine Hives       PHYSICAL EXAMINATION: Vital signs: BP 124/72   Pulse 74   Ht 5' 0.5" (1.537 m)   Wt 161 lb (73 kg)   SpO2 97%   BMI 30.93 kg/m   Constitutional: generally well-appearing, no acute distress Psychiatric: alert and oriented x3, cooperative Eyes: extraocular movements intact, anicteric, conjunctiva pink Mouth: oral pharynx moist, no lesions Neck: supple no lymphadenopathy Cardiovascular: heart regular rate and rhythm, no murmur Lungs: clear to auscultation bilaterally Abdomen: soft, nontender, nondistended, no obvious ascites, no peritoneal signs, normal bowel sounds, no  organomegaly Rectal: Omitted Extremities: no clubbing, cyanosis, or lower extremity edema bilaterally Skin: no lesions on visible extremities Neuro: No focal deficits.  Cranial nerves intact  ASSESSMENT:  1.  Iron deficiency anemia.  Rule out GI mucosal lesion. 2.  Intermittent dark stools consistent with melena.  Concerned over NSAID induced ulcer disease 3.  History of adenomatous colon polyps.  Multiple prior colonoscopies.  Last examination 2019 was negative for neoplasia 4.  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  Advanced age and diabetes mellitus   PLAN:  1.  Told to increase PPI to 40 mg daily. 2.  Hold NSAIDs 3.  Schedule upper endoscopy to evaluate iron deficiency anemia, melena, and dysphagia.  The patient is high risk given her age and comorbidities.The nature of the procedure, as well as the risks, benefits, and alternatives were carefully and thoroughly reviewed with the patient. Ample time for discussion and questions allowed. The patient understood, was satisfied, and agreed to proceed. 4.  Hold diabetic medications the day of the procedure to avoid unwanted hypoglycemia 5.  Continue with iron supplementation 6.  Further recommendations after the above Total time of 60 minutes was spent preparing to see the patient, reviewing outside records and laboratories, obtaining comprehensive history, performing medically appropriate physical examination, counseling and educating the patient regarding the above listed issues, and documenting clinical information in the health record

## 2022-05-29 ENCOUNTER — Encounter: Payer: Self-pay | Admitting: Internal Medicine

## 2022-05-29 ENCOUNTER — Ambulatory Visit (AMBULATORY_SURGERY_CENTER): Payer: Medicare Other | Admitting: Internal Medicine

## 2022-05-29 VITALS — BP 138/58 | HR 72 | Temp 98.4°F | Resp 21 | Ht 60.0 in | Wt 161.0 lb

## 2022-05-29 DIAGNOSIS — K225 Diverticulum of esophagus, acquired: Secondary | ICD-10-CM | POA: Diagnosis not present

## 2022-05-29 DIAGNOSIS — K921 Melena: Secondary | ICD-10-CM | POA: Diagnosis not present

## 2022-05-29 DIAGNOSIS — K200 Eosinophilic esophagitis: Secondary | ICD-10-CM

## 2022-05-29 DIAGNOSIS — D509 Iron deficiency anemia, unspecified: Secondary | ICD-10-CM | POA: Diagnosis not present

## 2022-05-29 DIAGNOSIS — K219 Gastro-esophageal reflux disease without esophagitis: Secondary | ICD-10-CM | POA: Diagnosis not present

## 2022-05-29 DIAGNOSIS — K222 Esophageal obstruction: Secondary | ICD-10-CM | POA: Diagnosis not present

## 2022-05-29 DIAGNOSIS — R1310 Dysphagia, unspecified: Secondary | ICD-10-CM

## 2022-05-29 MED ORDER — SODIUM CHLORIDE 0.9 % IV SOLN: 500.0000 mL | Freq: Once | INTRAVENOUS | Status: DC

## 2022-05-29 NOTE — Progress Notes (Signed)
Pt's states no medical or surgical changes since previsit or office visit. 

## 2022-05-29 NOTE — Progress Notes (Signed)
HISTORY OF PRESENT ILLNESS:   Kristy [name] is a 81 y.o. female with past medical history as listed below.  She is sent today by her primary care provider regarding iron deficiency anemia and melena.  I have seen the patient in the past for surveillance colonoscopy with previous colonoscopy examinations 2006, 2012, and 2019.  Her most recent examination was negative for neoplasia.  Patient states that she has been having fatigue over the past year.  Blood work was checked by her rheumatologist and she was found to be anemic with a hemoglobin of 8 3.  She was told to stop Celebrex (which she has taken daily for many years) and told to start a PPI (Prilosec 20 mg daily).  She was referred to her primary care provider.  Follow-up blood work around April 08, 2022 revealed anemia with hemoglobin 9.3, microcytic indices with MCV 71.3.  Iron deficiency with ferritin level 10.6 and iron saturation 4%.  Patient's baseline hemoglobin appears to be around 12.5.  Patient tells me that over the past 6 months she will occasionally have urgency with very loose stools which are black.  This may last for a day or so and that her stools returned to normal.  She has not had this in years leading up to this year.  No abdominal pain.  No nausea or vomiting.  She does mention dysphagia.  She states that food seems to get stuck in the side of her right neck and she will need to regurgitate it up for relief.  It is not clear that she is ever had prior endoscopy.  Finally, she tells me that she was diagnosed with planter fasciitis and was prescribed meloxicam yesterday.  She takes metformin for her diabetes.  Last hemoglobin A1c 5.7   REVIEW OF SYSTEMS:   All non-GI ROS negative unless otherwise stated in the HPI except for arthritis, fatigue       Past Medical History:  Diagnosis Date   Allergic rhinitis, seasonal     Arthritis     Asthma      bronchial with weather change   Diabetes mellitus without complication (HCC)       type 2    Dysrhythmia      "Skipping a beat"   GERD (gastroesophageal reflux disease)     History of colon polyps     Hypertension     Pneumonia     Rotator cuff tear, right             Past Surgical History:  Procedure Laterality Date   BACK SURGERY        x3  ruptured disc    CHOLECYSTECTOMY OPEN   AGE 87   EYE SURGERY        bil cataracts   HERNIA REPAIR       INGUINAL HERNIA REPAIR Right AGE 87s   KNEE ARTHROSCOPY Left 2014   LUMBAR DISC SURGERY   x2  LAST DATE EARLY 2000s   ORIF ANKLE FRACTURE Right 06/24/2018    Procedure: OPEN REDUCTION INTERNAL FIXATION (ORIF) RIGHT ANKLE FRACTURE;  Surgeon: Kathryne Hitch, MD;  Location: [facility];  Service: Orthopedics;  Laterality: Right;   ROTATOR CUFF REPAIR Left 01/2015   TONSILLECTOMY   AGE 19   TOTAL KNEE ARTHROPLASTY Right 12/09/2020    Procedure: TOTAL KNEE ARTHROPLASTY;  Surgeon: Ollen Gross, MD;  Location: [facility];  Service: Orthopedics;  Laterality: Right;    VAGINAL HYSTERECTOMY   AGE 36  WITH BSO      Social History Kristy [name]  reports that she quit smoking about 33 years ago. Her smoking use included cigarettes. She has never used smokeless tobacco. She reports current alcohol use of about 3.0 - 4.0 standard drinks of alcohol per week. She reports that she does not use drugs.   family history is not on file.       Allergies  Allergen Reactions   Clonidine Hives          PHYSICAL EXAMINATION: Vital signs: BP 124/72   Pulse 74   Ht 5' 0.5" (1.537 m)   Wt 161 lb (73 kg)   SpO2 97%   BMI 30.93 kg/m   Constitutional: generally well-appearing, no acute distress Psychiatric: alert and oriented x3, cooperative Eyes: extraocular movements intact, anicteric, conjunctiva pink Mouth: oral pharynx moist, no lesions Neck: supple no lymphadenopathy Cardiovascular: heart regular rate and rhythm, no murmur Lungs: clear to auscultation bilaterally Abdomen: soft, nontender, nondistended, no obvious  ascites, no peritoneal signs, normal bowel sounds, no organomegaly Rectal: Omitted Extremities: no clubbing, cyanosis, or lower extremity edema bilaterally Skin: no lesions on visible extremities Neuro: No focal deficits.  Cranial nerves intact   ASSESSMENT:   1.  Iron deficiency anemia.  Rule out GI mucosal lesion. 2.  Intermittent dark stools consistent with melena.  Concerned over NSAID induced ulcer disease 3.  History of adenomatous colon polyps.  Multiple prior colonoscopies.  Last examination 2019 was negative for neoplasia 4.  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  Advanced age and diabetes mellitus     PLAN:   1.  Told to increase PPI to 40 mg daily. 2.  Hold NSAIDs 3.  Schedule upper endoscopy to evaluate iron deficiency anemia, melena, and dysphagia.  The patient is high risk given her age and comorbidities.The nature of the procedure, as well as the risks, benefits, and alternatives were carefully and thoroughly reviewed with the patient. Ample time for discussion and questions allowed. The patient understood, was satisfied, and agreed to proceed. 4.  Hold diabetic medications the day of the procedure to avoid unwanted hypoglycemia 5.  Continue with iron supplementation 6.  Further recommendations after the above.

## 2022-05-29 NOTE — Op Note (Signed)
[facility] Patient Name: Kristy [name] Procedure Date: 05/29/2022 9:01 AM MRN: [id number] Endoscopist: Wilhemina Bonito. Marina Goodell , MD Age: 81 Referring MD:  Date of Birth: 05/02/1941 Gender: Female Account #: [id number] Procedure:                Upper GI endoscopy with balloon dilation of the                            esophagus. 18-20 mm Indications:              Iron deficiency anemia, Dysphagia, dark stools Medicines:                Monitored Anesthesia Care Procedure:                Pre-Anesthesia Assessment:                           - Prior to the procedure, a History and Physical                            was performed, and patient medications and                            allergies were reviewed. The patient's tolerance of                            previous anesthesia was also reviewed. The risks                            and benefits of the procedure and the sedation                            options and risks were discussed with the patient.                            All questions were answered, and informed consent                            was obtained. Prior Anticoagulants: The patient has                            taken no previous anticoagulant or antiplatelet                            agents. ASA Grade Assessment: II - A patient with                            mild systemic disease. After reviewing the risks                            and benefits, the patient was deemed in                            satisfactory condition to undergo the procedure.  After obtaining informed consent, the endoscope was                            passed under direct vision. Throughout the                            procedure, the patient's blood pressure, pulse, and                            oxygen saturations were monitored continuously. The                            GIF [id number] #[id number] was introduced through the                            mouth, and  advanced to the second part of duodenum.                            The upper GI endoscopy was accomplished without                            difficulty. The patient tolerated the procedure                            well. Scope In: Scope Out: Findings:                 The esophagus revealed a 12 mm subepithelial lesion                            in the upper esophagus. There were several webs in                            the lower esophagus as well as an esophageal                            diverticulum. 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 No esophagitis.. A TTS                            dilator was passed through the scope. Dilation of                            the distal esophageal stricture and webs, with an                            18-19-20 mm balloon dilator, was performed to 20                            mm. There was minor trauma.  The stomach revealed a large hiatal hernia                            measuring approximately 6 cm with associated                            Cameron erosions.                           The examined duodenum was normal.                           The cardia and gastric fundus were normal on                            retroflexion. Complications:            No immediate complications. Estimated Blood Loss:     Estimated blood loss: none. Impression:               1. 12 mm subepithelial lesion proximal esophagus                           2. Esophageal webs and distal diverticulum                           3. 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 Large hiatal hernia with Lysbeth Galas erosions. This                            is the cause for iron deficiency anemia, most                            likely.                           5. Otherwise normal EGD. Recommendation:           1. Patient has a contact  number available for                            emergencies. The signs and symptoms of potential                            delayed complications were discussed with the                            patient. Return to normal activities tomorrow.                            Written discharge instructions were provided to the  patient.                           2. Post dilation diet.                           3. Continue present medications.                           4. Please stay on iron supplement every day                           5. Office follow-up with Dr. Henrene Pastor in 6 to 8 weeks Kristy [name]. Henrene Pastor, MD 05/29/2022 9:43:44 AM This report has been signed electronically.

## 2022-05-29 NOTE — Patient Instructions (Signed)
Follow post dilation diet today. Stay on Iron supplement daily. Follow up in the office in 6-8 weeks.  YOU HAD AN ENDOSCOPIC PROCEDURE TODAY AT THE [facility]:   Refer to the procedure report that was given to you for any specific questions about what was found during the examination.  If the procedure report does not answer your questions, please call your gastroenterologist to clarify.  If you requested that your care partner not be given the details of your procedure findings, then the procedure report has been included in a sealed envelope for you to review at your convenience later.  YOU SHOULD EXPECT: Some feelings of bloating in the abdomen. Passage of more gas than usual.  Walking can help get rid of the air that was put into your GI tract during the procedure and reduce the bloating. If you had a lower endoscopy (such as a colonoscopy or flexible sigmoidoscopy) you may notice spotting of blood in your stool or on the toilet paper. If you underwent a bowel prep for your procedure, you may not have a normal bowel movement for a few days.  Please Note:  You might notice some irritation and congestion in your nose or some drainage.  This is from the oxygen used during your procedure.  There is no need for concern and it should clear up in a day or so.  SYMPTOMS TO REPORT IMMEDIATELY:  Following upper endoscopy (EGD)  Vomiting of blood or coffee ground material  New chest pain or pain under the shoulder blades  Painful or persistently difficult swallowing  New shortness of breath  Fever of 100F or higher  Black, tarry-looking stools  For urgent or emergent issues, a gastroenterologist can be reached at any hour by calling [phone]. Do not use MyChart messaging for urgent concerns.    DIET:  We do recommend a small meal at first, but then you may proceed to your regular diet.  Drink plenty of fluids but you should avoid alcoholic beverages for 24 hours.  ACTIVITY:  You  should plan to take it easy for the rest of today and you should NOT DRIVE or use heavy machinery until tomorrow (because of the sedation medicines used during the test).    FOLLOW UP: Our staff will call the number listed on your records the next business day following your procedure.  We will call around 7:15- 8:00 am to check on you and address any questions or concerns that you may have regarding the information given to you following your procedure. If we do not reach you, we will leave a message.  If you develop any symptoms (ie: fever, flu-like symptoms, shortness of breath, cough etc.) before then, please call [phone].  If you test positive for Covid 19 in the 2 weeks post procedure, please call and report this information to [place].    If any biopsies were taken you will be contacted by phone or by letter within the next 1-3 weeks.  Please call us at [phone] if you have not heard about the biopsies in 3 weeks.    SIGNATURES/CONFIDENTIALITY: You and/or your care partner have signed paperwork which will be entered into your electronic medical record.  These signatures attest to the fact that that the information above on your After Visit Summary has been reviewed and is understood.  Full responsibility of the confidentiality of this discharge information lies with you and/or your care-partner.

## 2022-05-29 NOTE — Progress Notes (Signed)
Report to pacu rn. Vss. Care resumed by rn. 

## 2022-06-01 ENCOUNTER — Telehealth: Payer: Self-pay

## 2022-06-01 NOTE — Telephone Encounter (Signed)
  Follow up Call-     05/29/2022    8:15 AM  Call back number  Post procedure Call Back phone  # [phone]  Permission to leave phone message Yes     Left message

## 2022-06-02 DIAGNOSIS — M722 Plantar fascial fibromatosis: Secondary | ICD-10-CM | POA: Diagnosis not present

## 2022-06-02 DIAGNOSIS — M76821 Posterior tibial tendinitis, right leg: Secondary | ICD-10-CM | POA: Diagnosis not present

## 2022-06-04 ENCOUNTER — Other Ambulatory Visit: Payer: Self-pay | Admitting: Cardiovascular Disease

## 2022-06-12 ENCOUNTER — Other Ambulatory Visit: Payer: Self-pay | Admitting: Internal Medicine

## 2022-06-12 DIAGNOSIS — I10 Essential (primary) hypertension: Secondary | ICD-10-CM

## 2022-06-30 ENCOUNTER — Other Ambulatory Visit: Payer: Self-pay | Admitting: Cardiovascular Disease

## 2022-07-05 ENCOUNTER — Other Ambulatory Visit: Payer: Self-pay | Admitting: Family Medicine

## 2022-07-05 DIAGNOSIS — K219 Gastro-esophageal reflux disease without esophagitis: Secondary | ICD-10-CM

## 2022-07-13 ENCOUNTER — Ambulatory Visit: Payer: Medicare Other | Admitting: Internal Medicine

## 2022-07-13 DIAGNOSIS — Z23 Encounter for immunization: Secondary | ICD-10-CM | POA: Diagnosis not present

## 2022-07-14 DIAGNOSIS — M84371A Stress fracture, right ankle, initial encounter for fracture: Secondary | ICD-10-CM | POA: Diagnosis not present

## 2022-07-14 DIAGNOSIS — M722 Plantar fascial fibromatosis: Secondary | ICD-10-CM | POA: Diagnosis not present

## 2022-07-14 DIAGNOSIS — R29898 Other symptoms and signs involving the musculoskeletal system: Secondary | ICD-10-CM | POA: Diagnosis not present

## 2022-07-15 IMAGING — CT CT NECK W/ CM
4 of 5 series · 15 of 33 positions shown, 17 images · IV contrast (omnipaque)
Comparison: CT neck 09/03/2021

CLINICAL DATA: Cellulitis with progressive soft tissue swelling. On
antibiotics.

EXAM:
CT NECK WITH CONTRAST
TECHNIQUE: Multidetector CT imaging of the neck was performed using the
standard protocol following the bolus administration of intravenous
contrast.
CONTRAST:  75mL OMNIPAQUE IOHEXOL 300 MG/ML  SOLN

[Series 3: neck 2.0 i31s 3 · axial · 0.56mm/px · z∈[-208,-116]mm · 3 of 116 slices shown]
[im 24/116  bone]
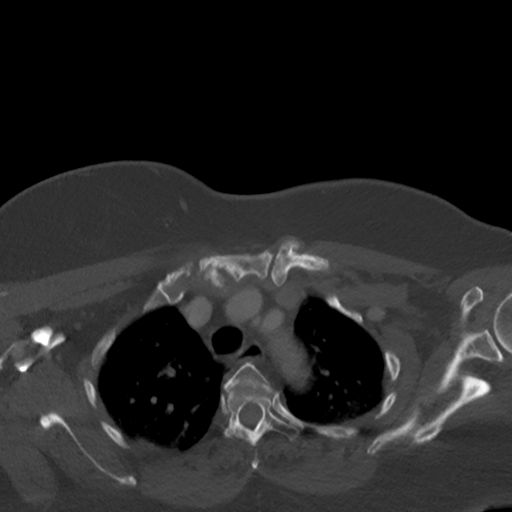
[im 47/116  bone]
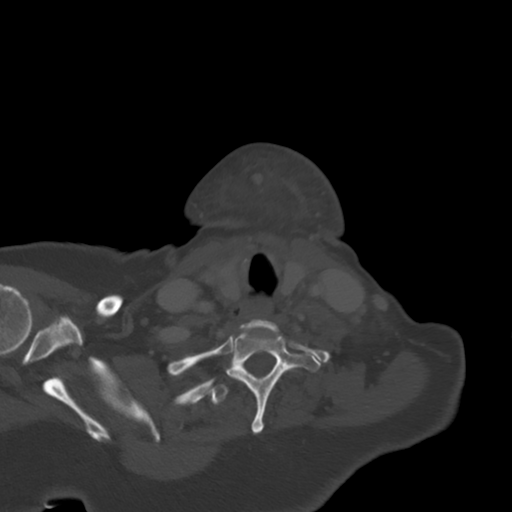
[im 70/116  bone]
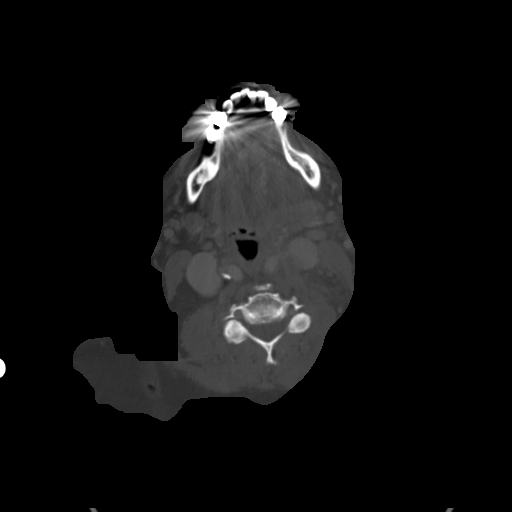

[Series 7: coronal st · coronal · 0.50mm/px · 3 of 116 slices shown]
[im 36/116  bone]
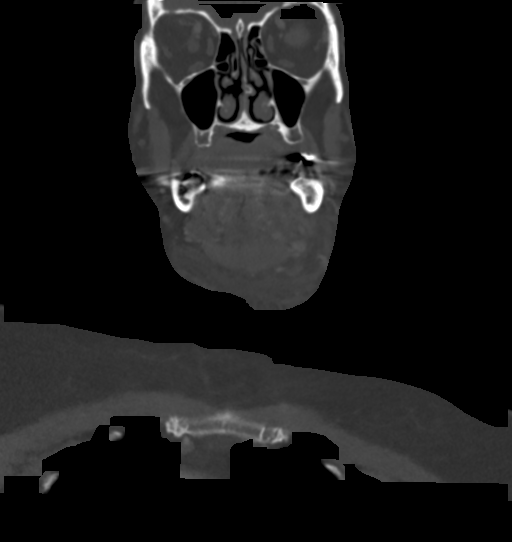
[im 51/116  bone]
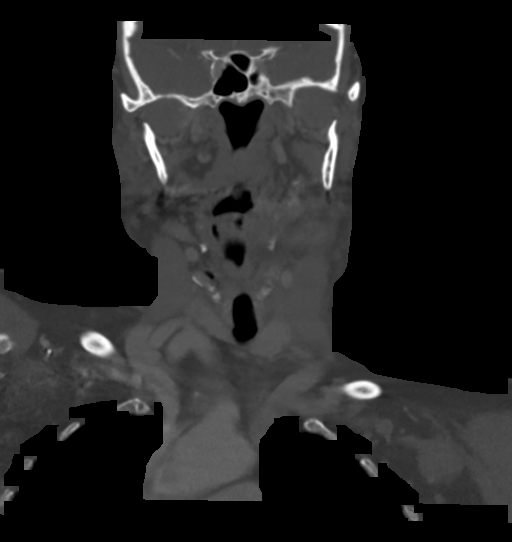
[im 65/116  bone]
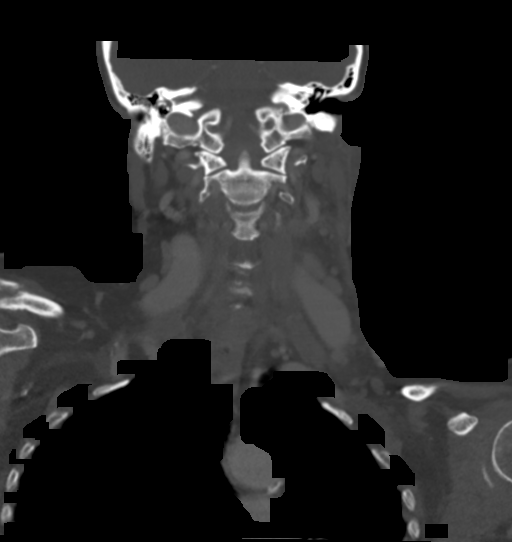

[Series 8: sagittal st · sagittal · 0.50mm/px · 5 of 96 slices shown, 6 images]
[im 32/96  bone]
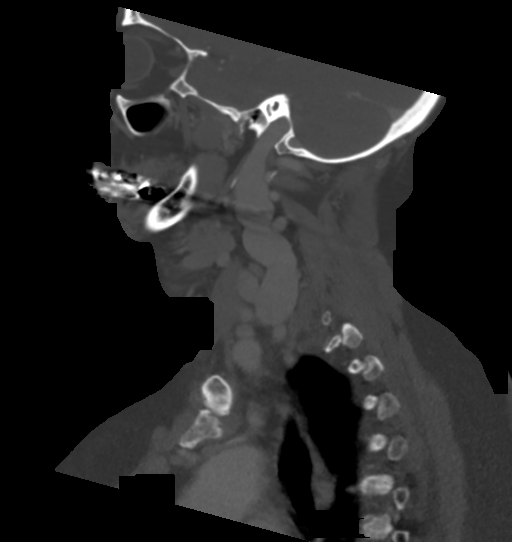
[im 40/96  bone]
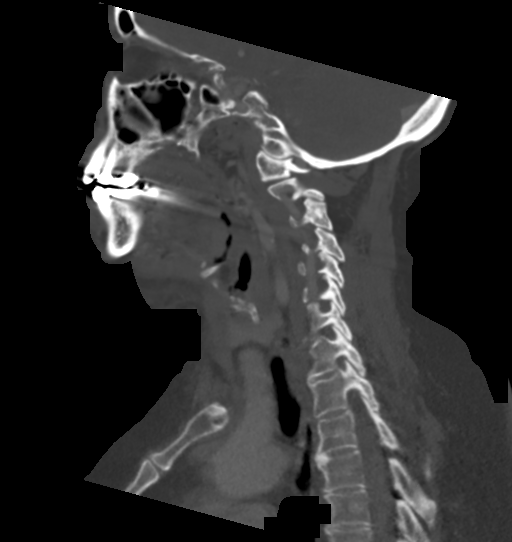
[im 48/96  soft-tissue]
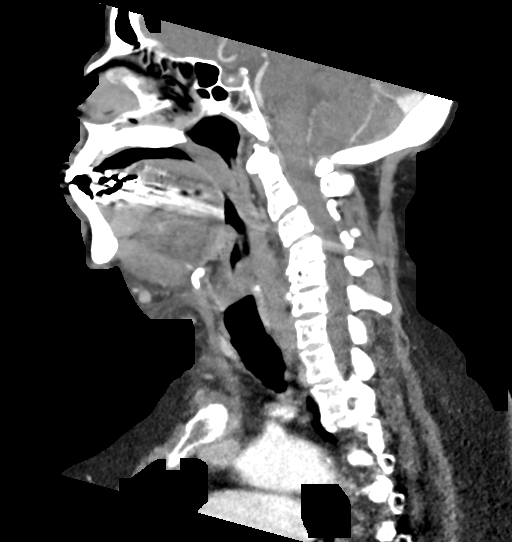
[im 48/96  bone]
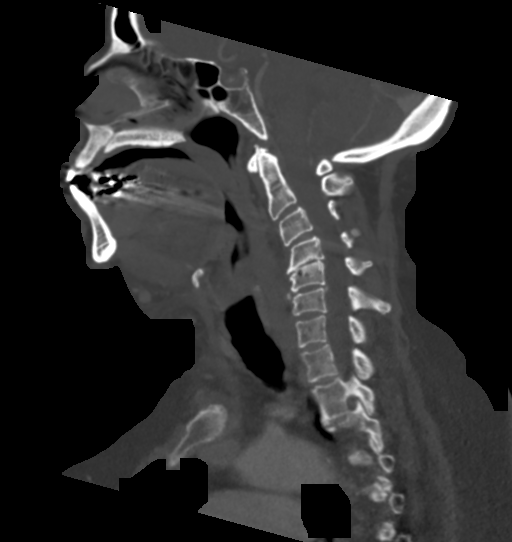
[im 56/96  bone]
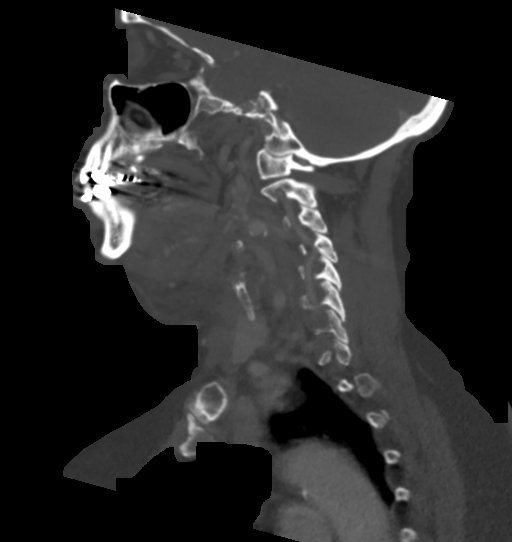
[im 64/96  bone]
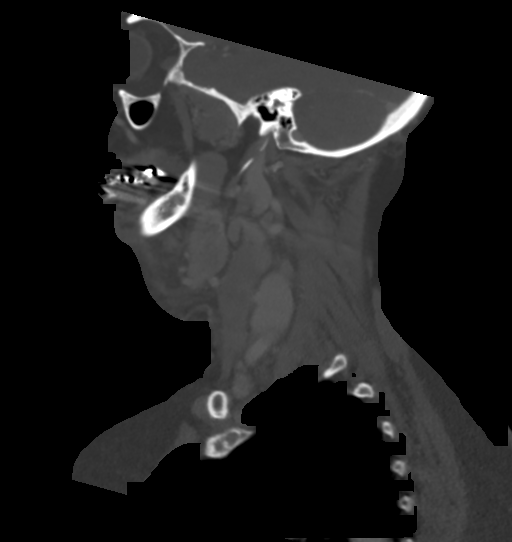

[Series 9: orthogonal st · axial · 0.39mm/px · z∈[-241,-84]mm · 4 of 136 slices shown, 5 images]
[im 28/136  soft-tissue]
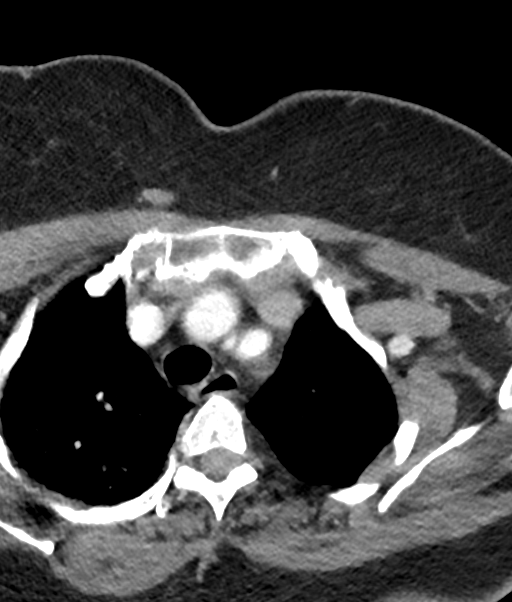
[im 28/136  bone]
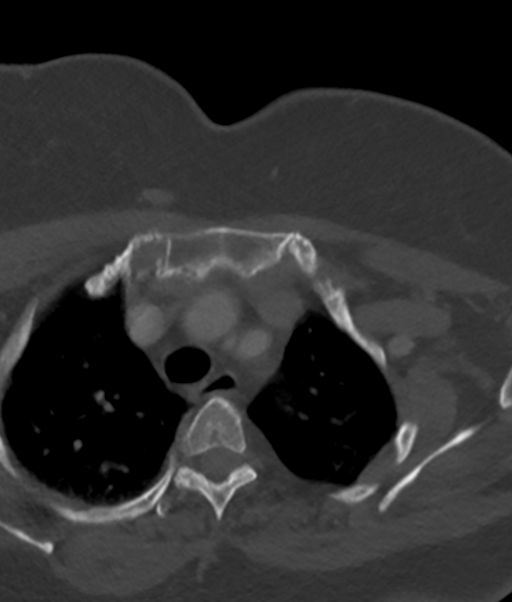
[im 55/136  bone]
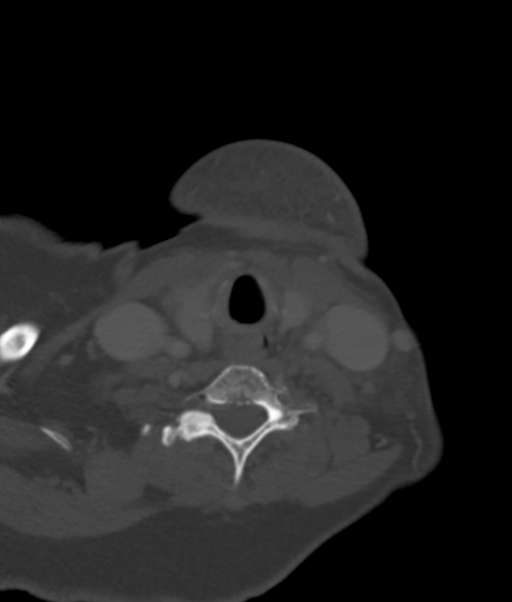
[im 82/136  bone]
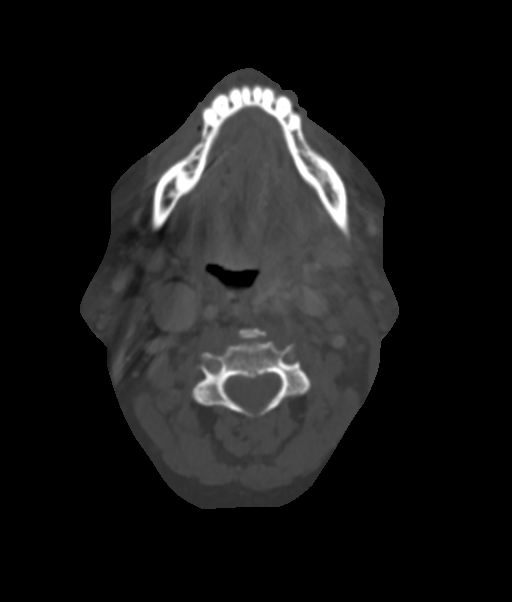
[im 109/136  bone]
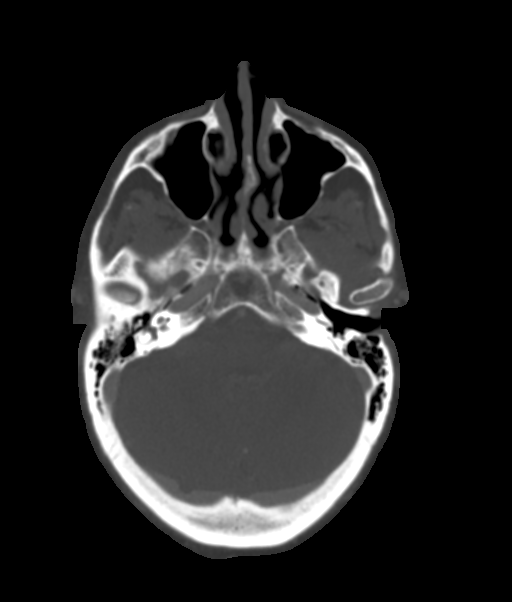

[15 of 33 positions shown; findings below may reference images not displayed]

FINDINGS: Pharynx and larynx: No pharyngeal mass or abscess. Airway intact.
Epiglottis and larynx normal. Effacement of the left piriform sinus
due to submucosal edema of the left pharynx.

Salivary glands: There is stranding surrounding the left
submandibular gland however the left submandibular gland is normal
without stone mass or abscess. Right submandibular gland normal.
Parotid normal bilaterally.

Thyroid: Negative

Lymph nodes: Scattered submandibular lymph nodes bilaterally left
greater than right with mild progression since the prior study.
These appear reactive due to cellulitis. Subcentimeter level 2 lymph
nodes on the left also likely reactive. These are similar to the
prior study.

Vascular: Normal vascular enhancement.

Limited intracranial: Negative

Visualized orbits: No orbital mass or edema. Bilateral cataract
extraction

Mastoids and visualized paranasal sinuses: Negative

Skeleton: No acute skeletal abnormality. No dental abscess
identified.

Upper chest: Lung apices clear bilaterally.

Other: Progression of subcutaneous edema in the submandibular
region. This is more prominent the left but does cross the midline
to the right. No focal abscess. There is edema surrounding the left
submandibular gland and also extending posterior to the
sternocleidomastoid muscle on the left. Hyperenhancing submandibular
lymph nodes left greater than right have progressed in the interval.
IMPRESSION: 1. Progression of cellulitis in the submandibular region, asymmetric
to the left. Progression of reactive lymph nodes in the left
submandibular region. No abscess
2. No airway compromise identified. There is mild edema effacing the
left piriform sinus

## 2022-07-16 ENCOUNTER — Ambulatory Visit (INDEPENDENT_AMBULATORY_CARE_PROVIDER_SITE_OTHER): Payer: Medicare Other | Admitting: Internal Medicine

## 2022-07-16 ENCOUNTER — Encounter: Payer: Self-pay | Admitting: Internal Medicine

## 2022-07-16 VITALS — BP 130/80 | HR 66 | Temp 97.8°F | Ht 60.5 in | Wt 162.3 lb

## 2022-07-16 DIAGNOSIS — I10 Essential (primary) hypertension: Secondary | ICD-10-CM | POA: Diagnosis not present

## 2022-07-16 DIAGNOSIS — J4540 Moderate persistent asthma, uncomplicated: Secondary | ICD-10-CM | POA: Diagnosis not present

## 2022-07-16 DIAGNOSIS — R7302 Impaired glucose tolerance (oral): Secondary | ICD-10-CM

## 2022-07-16 DIAGNOSIS — D500 Iron deficiency anemia secondary to blood loss (chronic): Secondary | ICD-10-CM | POA: Diagnosis not present

## 2022-07-16 DIAGNOSIS — Z780 Asymptomatic menopausal state: Secondary | ICD-10-CM

## 2022-07-16 DIAGNOSIS — Z0000 Encounter for general adult medical examination without abnormal findings: Secondary | ICD-10-CM

## 2022-07-16 DIAGNOSIS — E119 Type 2 diabetes mellitus without complications: Secondary | ICD-10-CM

## 2022-07-16 DIAGNOSIS — Z13820 Encounter for screening for osteoporosis: Secondary | ICD-10-CM

## 2022-07-16 LAB — COMPREHENSIVE METABOLIC PANEL
ALT: 19 U/L (ref 0–35)
AST: 15 U/L (ref 0–37)
Albumin: 4.4 g/dL (ref 3.5–5.2)
Alkaline Phosphatase: 77 U/L (ref 39–117)
BUN: 22 mg/dL (ref 6–23)
CO2: 27 mEq/L (ref 19–32)
Calcium: 10 mg/dL (ref 8.4–10.5)
Chloride: 102 mEq/L (ref 96–112)
Creatinine, Ser: 0.88 mg/dL (ref 0.40–1.20)
GFR: 60.93 mL/min (ref >60.00)
Glucose, Bld: 99 mg/dL (ref 70–99)
Potassium: 3.8 mEq/L (ref 3.5–5.1)
Sodium: 138 mEq/L (ref 135–145)
Total Bilirubin: 0.3 mg/dL (ref 0.2–1.2)
Total Protein: 7.7 g/dL (ref 6.0–8.3)

## 2022-07-16 LAB — CBC WITH DIFFERENTIAL/PLATELET
Basophils Absolute: 0.1 10*3/uL (ref 0.0–0.1)
Basophils Relative: 0.8 % (ref 0.0–3.0)
Eosinophils Absolute: 0.4 10*3/uL (ref 0.0–0.7)
Eosinophils Relative: 6.4 % — ABNORMAL HIGH (ref 0.0–5.0)
HCT: 38.8 % (ref 36.0–46.0)
Hemoglobin: 12.9 g/dL (ref 12.0–15.0)
Lymphocytes Relative: 31.3 % (ref 12.0–46.0)
Lymphs Abs: 1.9 10*3/uL (ref 0.7–4.0)
MCHC: 33.1 g/dL (ref 30.0–36.0)
MCV: 85.6 fl (ref 78.0–100.0)
Monocytes Absolute: 0.5 10*3/uL (ref 0.1–1.0)
Monocytes Relative: 8 % (ref 3.0–12.0)
Neutro Abs: 3.3 10*3/uL (ref 1.4–7.7)
Neutrophils Relative %: 53.5 % (ref 43.0–77.0)
Platelets: 282 10*3/uL (ref 150.0–400.0)
RBC: 4.54 Mil/uL (ref 3.87–5.11)
RDW: 19.8 % — ABNORMAL HIGH (ref 11.5–15.5)
WBC: 6.1 10*3/uL (ref 4.0–10.5)

## 2022-07-16 LAB — LIPID PANEL
Cholesterol: 197 mg/dL (ref 0–200)
HDL: 73.1 mg/dL (ref >39.00)
LDL Cholesterol: 106 mg/dL — ABNORMAL HIGH (ref 0–99)
NonHDL: 124.09
Total CHOL/HDL Ratio: 3
Triglycerides: 90 mg/dL (ref 0.0–149.0)
VLDL: 18 mg/dL (ref 0.0–40.0)

## 2022-07-16 LAB — MICROALBUMIN / CREATININE URINE RATIO
Creatinine,U: 107.3 mg/dL
Microalb Creat Ratio: 0.7 mg/g (ref 0.0–30.0)
Microalb, Ur: <0.7 mg/dL (ref 0.0–1.9)

## 2022-07-16 LAB — IBC + FERRITIN
Ferritin: 24 ng/mL (ref 10.0–291.0)
Iron: 56 ug/dL (ref 42–145)
Saturation Ratios: 14.4 % — ABNORMAL LOW (ref 20.0–50.0)
TIBC: 387.8 ug/dL (ref 250.0–450.0)
Transferrin: 277 mg/dL (ref 212.0–360.0)

## 2022-07-16 LAB — HEMOGLOBIN A1C: Hgb A1c MFr Bld: 6.3 % (ref 4.6–6.5)

## 2022-07-16 NOTE — Patient Instructions (Signed)
-  Nice seeing you today!!  -Lab work today; will notify you once results are available.  -Consider shingles vaccine at pharmacy.  -Schedule follow up in 6 months.

## 2022-07-16 NOTE — Progress Notes (Signed)
Established Patient Office Visit     CC/Reason for Visit: Subsequent Medicare wellness visit and follow-up chronic conditions  HPI: Kristy [name] is a 81 y.o. female who is coming in today for the above mentioned reasons. Past Medical History is significant for: Asthma, hypertension, type 2 diabetes, history of right lower extremity DVT.  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  She is now on twice daily PPI therapy as well as twice daily iron supplementation.  She will follow-up with GI soon.  Her asthma she feels has been uncontrolled.  She has a lot of audible wheezing on exam today.  She is up-to-date on eye and dental care.  She had flu and pneumonia vaccines at pharmacy yesterday, she is overdue for a bone density test and shingles vaccinations.   Past Medical/Surgical History: Past Medical History:  Diagnosis Date   Allergic rhinitis, seasonal    Arthritis    Asthma    bronchial with weather change   Diabetes mellitus without complication (HCC)    type 2    Dysrhythmia    "Skipping a beat"   GERD (gastroesophageal reflux disease)    History of colon polyps    Hypertension    Pneumonia    Rotator cuff tear, right     Past Surgical History:  Procedure Laterality Date   BACK SURGERY     x3  ruptured disc    CHOLECYSTECTOMY OPEN  AGE 39   EYE SURGERY     bil cataracts   HERNIA REPAIR     INGUINAL HERNIA REPAIR Right AGE 39s   KNEE ARTHROSCOPY Left 2014   LUMBAR DISC SURGERY  x2  LAST DATE EARLY 2000s   ORIF ANKLE FRACTURE Right 06/24/2018   Procedure: OPEN REDUCTION INTERNAL FIXATION (ORIF) RIGHT ANKLE FRACTURE;  Surgeon: Kathryne Hitch, MD;  Location: [facility];  Service: Orthopedics;  Laterality: Right;   ROTATOR CUFF REPAIR Left 01/2015   TONSILLECTOMY  AGE 44   TOTAL KNEE ARTHROPLASTY Right 12/09/2020   Procedure: TOTAL KNEE ARTHROPLASTY;  Surgeon: Ollen Gross,  MD;  Location: [facility];  Service: Orthopedics;  Laterality: Right;    VAGINAL HYSTERECTOMY  AGE 69   WITH BSO    Social History:  reports that she quit smoking about 33 years ago. Her smoking use included cigarettes. She has never used smokeless tobacco. She reports current alcohol use of about 3.0 - 4.0 standard drinks of alcohol per week. She reports that she does not use drugs.  Allergies: Allergies  Allergen Reactions   Clonidine Hives    Family History:  Family History  Problem Relation Age of Onset   Colon cancer Neg Hx    Esophageal cancer Neg Hx    Rectal cancer Neg Hx    Stomach cancer Neg Hx      Current Outpatient Medications:    albuterol (VENTOLIN HFA) 108 (90 Base) MCG/ACT inhaler, INHALE 2 PUFFS BY MOUTH EVERY 6 HOURS AS NEEDED FOR WHEEZING, Disp: 6.7 each, Rfl: 1   amLODipine (NORVASC) 10 MG tablet, TAKE 1 TABLET BY MOUTH EVERY DAY, Disp: 90 tablet, Rfl: 0   benazepril (LOTENSIN) 40 MG tablet, TAKE 1 TABLET BY MOUTH EVERY DAY, Disp: 90 tablet, Rfl: 0   budesonide-formoterol (SYMBICORT) 160-4.5 MCG/ACT inhaler, Inhale 2 puffs into the lungs 2 (two) times daily., Disp: 1 each, Rfl: 3   ferrous sulfate 325 (65 FE)  MG tablet, Take 1 tablet (325 mg total) by mouth 2 (two) times daily with a meal., Disp: 60 tablet, Rfl: 3   hydrochlorothiazide (HYDRODIURIL) 12.5 MG tablet, TAKE 1 TABLET (12.5 MG TOTAL) BY MOUTH AS NEEDED. FOR SWELLING, Disp: 90 tablet, Rfl: 3   MELOXICAM PO, Take by mouth., Disp: , Rfl:    metFORMIN (GLUCOPHAGE) 500 MG tablet, TAKE 1 TABLET BY MOUTH 2 TIMES DAILY WITH A MEAL., Disp: 180 tablet, Rfl: 1   metoprolol succinate (TOPROL-XL) 50 MG 24 hr tablet, TAKE 1 TABLET (50 MG TOTAL) BY MOUTH DAILY. PATIENT NEED A APPOINTMENT FOR FUTURE REFILLS 2ND ATTEMPT, Disp: 30 tablet, Rfl: 0   omeprazole (PRILOSEC) 20 MG capsule, Take 1 capsule (20 mg total) by mouth 2 (two) times daily before a meal., Disp: 180 capsule, Rfl: 3  Review of Systems:   Constitutional: Denies fever, chills, diaphoresis, appetite change and fatigue.  HEENT: Denies photophobia, eye pain, redness, hearing loss, ear pain, congestion, sore throat, rhinorrhea, sneezing, mouth sores, trouble swallowing, neck pain, neck stiffness and tinnitus.   Respiratory: Denies SOB, DOE, chest tightness.   Cardiovascular: Denies chest pain, palpitations and leg swelling.  Gastrointestinal: Denies nausea, vomiting, abdominal pain, diarrhea, constipation, blood in stool and abdominal distention.  Genitourinary: Denies dysuria, urgency, frequency, hematuria, flank pain and difficulty urinating.  Endocrine: Denies: hot or cold intolerance, sweats, changes in hair or nails, polyuria, polydipsia. Musculoskeletal: Denies myalgias, back pain, joint swelling, arthralgias and gait problem.  Skin: Denies pallor, rash and wound.  Neurological: Denies dizziness, seizures, syncope, weakness, light-headedness, numbness and headaches.  Hematological: Denies adenopathy. Easy bruising, personal or family bleeding history  Psychiatric/Behavioral: Denies suicidal ideation, mood changes, confusion, nervousness, sleep disturbance and agitation    Physical Exam: Vitals:   07/16/22 0905  BP: 130/80  Pulse: 66  Temp: 97.8 F (36.6 C)  TempSrc: Oral  SpO2: 99%  Weight: 162 lb 4.8 oz (73.6 kg)  Height: 5' 0.5" (1.537 m)    Body mass index is 31.18 kg/m.   Constitutional: NAD, calm, comfortable Eyes: PERRL, lids and conjunctivae normal ENMT: Mucous membranes are moist. Posterior pharynx clear of any exudate or lesions. Normal dentition. Tympanic membrane is pearly white, no erythema or bulging. Neck: normal, supple, no masses, no thyromegaly Respiratory: Bilateral rhonchi, some upper airway wheezing, no crackles. Normal respiratory effort. No accessory muscle use.  Cardiovascular: Regular rate and rhythm, no murmurs / rubs / gallops. No extremity edema. 2+ pedal pulses. No carotid bruits.   Abdomen: no tenderness, no masses palpated. No hepatosplenomegaly. Bowel sounds positive.  Musculoskeletal: no clubbing / cyanosis. No joint deformity upper and lower extremities. Good ROM, no contractures. Normal muscle tone.  Skin: no rashes, lesions, ulcers. No induration Neurologic: CN 2-12 grossly intact. Sensation intact, DTR normal. Strength 5/5 in all 4.  Psychiatric: Normal judgment and insight. Alert and oriented x 3. Normal mood.    Subsequent Medicare wellness visit   1. Risk factors, based on past  M,S,F -cardiovascular disease risk factors include age, history of hypertension, type 2 diabetes   2.  Physical activities: Walks daily   3.  Depression/mood: Stable, not depressed   4.  Hearing: No perceived issues   5.  ADL's: Independent in all ADLs   6.  Fall risk: Low fall risk   7.  Home safety: No problems identified   8.  Height weight, and visual acuity: height and weight as above, vision:  Vision Screening   Right eye Left eye Both eyes  Without correction     With correction 20/20 20/20 20/20      9.  Counseling: Advised to update vaccinations and increase exercise   10. Lab orders based on risk factors: Laboratory update will be reviewed   11. Referral : DEXA scan   12. Care plan: Follow-up with me in 6 months   13. Cognitive assessment: No cognitive impairment   14. Screening: Patient provided with a written and personalized 5-10 year screening schedule in the AVS. yes   15. Provider List Update: PCP, GI, orthopedist  16. Advance Directives: Full code   17. Opioids: Patient is not on any opioid prescriptions and has no risk factors for a substance use disorder.   Flowsheet Row Office Visit from 07/16/2022 in [facility] [place]  PHQ-9 Total Score 0          09/11/2021    8:00 PM 09/12/2021    8:00 AM 09/23/2021    7:59 AM 02/23/2022    8:12 AM 07/16/2022    9:26 AM  Fall Risk  Falls in the past year?   0 1 0  Was there an  injury with Fall?   0 1 0  Fall Risk Category Calculator   0 2 0  Fall Risk Category   Low Moderate Low  Patient Fall Risk Level Moderate fall risk Moderate fall risk  Moderate fall risk Low fall risk  Patient at Risk for Falls Due to    Impaired balance/gait No Fall Risks  Fall risk Follow up    Falls evaluation completed Falls evaluation completed     Impression and Plan:  Medicare annual wellness visit, subsequent  Type 2 diabetes mellitus without complication, unspecified whether long term insulin use (HCC) - Plan: Urine microalbumin-creatinine with uACR  Essential hypertension - Plan: Comprehensive metabolic panel, Lipid panel, Lipid panel, Comprehensive metabolic panel  Iron deficiency anemia due to chronic blood loss - Plan: CBC with Differential/Platelet, IBC + Ferritin, IBC + Ferritin, CBC with Differential/Platelet  IGT (impaired glucose tolerance) - Plan: Hemoglobin A1c, Hemoglobin A1c  Moderate persistent asthma, unspecified whether complicated - Plan: Ambulatory referral to Pulmonology  Encounter for osteoporosis screening in asymptomatic postmenopausal patient - Plan: DG Bone Density  -Recommend routine eye and dental care. -Immunizations: Due for shingles, will obtain at pharmacy, other immunizations are up-to-date -Healthy lifestyle discussed in detail. -Labs to be updated today. -Colon cancer screening: 03/2018 -Breast cancer screening: 03/2022 -Cervical cancer screening: No further due to age -Lung cancer screening: Not applicable -Prostate cancer screening: Not applicable -DEXA: DEXA requested today  -For her diabetes, A1c and microalbumin today.  She had her diabetic eye exam in May. -Blood pressure is well controlled. -Charts have been reviewed in regards to her iron deficiency anemia and EGD.  Agree with continuing PPI therapy and iron supplementation for now, recheck iron indices today. -She will be sent to see a pulmonologist as her asthma is not well  controlled.    Patient Instructions  -Nice seeing you today!!  -Lab work today; will notify you once results are available.  -Consider shingles vaccine at pharmacy.  -Schedule follow up in 6 months.   June, MD [facility]

## 2022-07-20 ENCOUNTER — Other Ambulatory Visit: Payer: Self-pay | Admitting: Family Medicine

## 2022-07-20 DIAGNOSIS — D649 Anemia, unspecified: Secondary | ICD-10-CM

## 2022-07-21 ENCOUNTER — Other Ambulatory Visit: Payer: Self-pay | Admitting: *Deleted

## 2022-07-21 DIAGNOSIS — R7302 Impaired glucose tolerance (oral): Secondary | ICD-10-CM

## 2022-07-24 ENCOUNTER — Other Ambulatory Visit: Payer: Self-pay | Admitting: Cardiovascular Disease

## 2022-07-31 ENCOUNTER — Other Ambulatory Visit: Payer: Self-pay | Admitting: Internal Medicine

## 2022-08-04 DIAGNOSIS — J310 Chronic rhinitis: Secondary | ICD-10-CM | POA: Diagnosis not present

## 2022-08-04 DIAGNOSIS — R058 Other specified cough: Secondary | ICD-10-CM | POA: Diagnosis not present

## 2022-08-04 DIAGNOSIS — J040 Acute laryngitis: Secondary | ICD-10-CM | POA: Diagnosis not present

## 2022-08-04 DIAGNOSIS — K219 Gastro-esophageal reflux disease without esophagitis: Secondary | ICD-10-CM | POA: Diagnosis not present

## 2022-08-04 DIAGNOSIS — R490 Dysphonia: Secondary | ICD-10-CM | POA: Diagnosis not present

## 2022-08-10 DIAGNOSIS — M545 Low back pain, unspecified: Secondary | ICD-10-CM | POA: Diagnosis not present

## 2022-08-10 DIAGNOSIS — M546 Pain in thoracic spine: Secondary | ICD-10-CM | POA: Diagnosis not present

## 2022-08-10 DIAGNOSIS — M5136 Other intervertebral disc degeneration, lumbar region: Secondary | ICD-10-CM | POA: Diagnosis not present

## 2022-08-10 DIAGNOSIS — M5416 Radiculopathy, lumbar region: Secondary | ICD-10-CM | POA: Diagnosis not present

## 2022-08-12 ENCOUNTER — Encounter: Payer: Self-pay | Admitting: Internal Medicine

## 2022-08-12 ENCOUNTER — Ambulatory Visit (INDEPENDENT_AMBULATORY_CARE_PROVIDER_SITE_OTHER): Payer: Medicare Other | Admitting: Internal Medicine

## 2022-08-12 VITALS — BP 124/78 | HR 74 | Ht 60.5 in | Wt 161.0 lb

## 2022-08-12 VITALS — BP 140/74 | HR 72 | Temp 97.6°F | Ht 61.0 in | Wt 162.4 lb

## 2022-08-12 DIAGNOSIS — K219 Gastro-esophageal reflux disease without esophagitis: Secondary | ICD-10-CM | POA: Diagnosis not present

## 2022-08-12 DIAGNOSIS — R195 Other fecal abnormalities: Secondary | ICD-10-CM | POA: Diagnosis not present

## 2022-08-12 DIAGNOSIS — K222 Esophageal obstruction: Secondary | ICD-10-CM

## 2022-08-12 DIAGNOSIS — D509 Iron deficiency anemia, unspecified: Secondary | ICD-10-CM

## 2022-08-12 DIAGNOSIS — J301 Allergic rhinitis due to pollen: Secondary | ICD-10-CM | POA: Diagnosis not present

## 2022-08-12 DIAGNOSIS — R1310 Dysphagia, unspecified: Secondary | ICD-10-CM

## 2022-08-12 DIAGNOSIS — J4540 Moderate persistent asthma, uncomplicated: Secondary | ICD-10-CM

## 2022-08-12 NOTE — Progress Notes (Signed)
Kristy [name]    [id number]    [birth year]  Primary Care Physician:Hernandez Everardo Beals, MD  Referring Physician: Isaac Bliss, Rayford Halsted, MD [address] [place],  [place] [address] Reason for Consultation: asthma Date of Consultation: 08/12/2022  Chief complaint:   Chief Complaint  Patient presents with   Consult    Persistent asthma. Cough, wheeze and SOB flares.  ENT gave Zpak last week.     HPI: Kristy [name] is a 81 y.o. woman who presents for new patient evaluation for shortness of breath.  Started having symptoms of recurrent bronchitis twice a year in her 61s.  This past year has been getting different inhalers for recurrent episodes.    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 She   Her bronchitis gets better with prednisone.   Has been on inhaled steroids for the last 7-8 years, most recently on symbicort. Was on advair previously. Current albuterol use is twice a week.  Current Regimen: symbicort 2 puffs twice a day, albuterol.  Asthma Triggers: seasonal changes Exacerbations in the last year: once.  History of hospitalization or intubation: never Allergy Testing: never had.  GERD: yes on PPI.  Allergic Rhinitis: yes on claritin (was previously on allegra) ACT:  Asthma Control Test ACT Total Score  08/12/2022  2:23 PM 15   FeNO:  Social history:  Occupation: was in the [employer] treasurer, Social worker. 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   Social History   Occupational History   Occupation: retired  Tobacco Use   Smoking status: Former    Years: 10.00    Types: Cigarettes    Quit date: 10/19/1988    Years since quitting: 33.8   Smokeless tobacco: Never  Vaping Use    Vaping Use: Never used  Substance and Sexual Activity   Alcohol use: Yes    Alcohol/week: 3.0 - 4.0 standard drinks of alcohol    Types: 3 - 4 Glasses of wine per week    Comment: 2-3 times a week   Drug use: Never   Sexual activity: Not Currently    Relevant family history:  Family History  Problem Relation Age of Onset   Colon cancer Neg Hx    Esophageal cancer Neg Hx    Rectal cancer Neg Hx    Stomach cancer Neg Hx    Asthma Neg Hx     Past Medical History:  Diagnosis Date   Allergic rhinitis, seasonal    Arthritis    Asthma    bronchial with weather change   Diabetes mellitus without complication ([place])    type 2    Dysrhythmia    "Skipping a beat"   GERD (gastroesophageal reflux disease)    History of colon polyps    Hypertension    Pneumonia    Rotator cuff tear, right     Past Surgical History:  Procedure Laterality Date   BACK SURGERY     x3  ruptured disc    CHOLECYSTECTOMY OPEN  AGE 21   EYE SURGERY     bil cataracts   HERNIA REPAIR     INGUINAL HERNIA REPAIR Right AGE 12s   KNEE ARTHROSCOPY Left 2014   [place]  x2  LAST DATE EARLY 2000s  ORIF ANKLE FRACTURE Right 06/24/2018   Procedure: OPEN REDUCTION INTERNAL FIXATION (ORIF) RIGHT ANKLE FRACTURE;  Surgeon: Kathryne Hitch, MD;  Location: [facility];  Service: Orthopedics;  Laterality: Right;   ROTATOR CUFF REPAIR Left 01/2015   TONSILLECTOMY  AGE 74   TOTAL KNEE ARTHROPLASTY Right 12/09/2020   Procedure: TOTAL KNEE ARTHROPLASTY;  Surgeon: Ollen Gross, MD;  Location: [facility];  Service: Orthopedics;  Laterality: Right;    VAGINAL HYSTERECTOMY  AGE 40   WITH BSO     Physical Exam: Blood pressure (!) 140/74, pulse 72, temperature 97.6 F (36.4 C), temperature source Oral, height 5\' 1"  (1.549 m), weight 162 lb 6.4 oz (73.7 kg), SpO2 99 %. Gen:      No acute distress ENT:  no nasal polyps, mucus membranes moist Lungs:    No increased respiratory effort, symmetric chest wall  excursion, clear to auscultation bilaterally, no wheezes or crackles CV:         Regular rate and rhythm; no murmurs, rubs, or gallops.  No pedal edema Abd:      + bowel sounds; soft, non-tender; no distension MSK: no acute synovitis of DIP or PIP joints, no mechanics hands.  Skin:      Warm and dry; no rashes Neuro: normal speech, no focal facial asymmetry Psych: alert and oriented x3, normal mood and affect   Data Reviewed/Medical Decision Making:  Independent interpretation of tests: Imaging:  Review of patient's  images revealed . The patient's images have been independently reviewed by me.    PFTs:    Labs:  Lab Results  Component Value Date   WBC 6.1 07/16/2022   HGB 12.9 07/16/2022   HCT 38.8 07/16/2022   MCV 85.6 07/16/2022   PLT 282.0 07/16/2022   Lab Results  Component Value Date   NA 138 07/16/2022   K 3.8 07/16/2022   CL 102 07/16/2022   CO2 27 07/16/2022      Immunization status:  Immunization History  Administered Date(s) Administered   Fluad Quad(high Dose 65+) 07/05/2019, 08/22/2020, 07/13/2022   Influenza Split 09/23/2011, 09/09/2013   Influenza Whole 10/04/2007, 08/07/2008, 09/16/2009   Influenza, High Dose Seasonal PF 10/04/2015, 08/05/2016, 09/14/2017, 09/08/2018   Influenza,inj,Quad PF,6+ Mos 08/07/2014   Influenza,inj,quad, With Preservative 02/05/2021   Influenza-Unspecified 08/05/2021   Moderna Sars-Covid-2 Vaccination 12/17/2019   PFIZER Comirnaty(Gray Top)Covid-19 Tri-Sucrose Vaccine 02/05/2021, 07/13/2022   PFIZER(Purple Top)SARS-COV-2 Vaccination 11/07/2019, 11/28/2019, 07/18/2020   PNEUMOCOCCAL CONJUGATE-20 02/05/2021, 07/13/2022   Pfizer Covid-19 Vaccine Bivalent Booster 39yrs & up 08/05/2021   Pneumococcal Conjugate-13 10/09/2013   Pneumococcal Polysaccharide-23 04/03/2016   Tetanus 10/09/2013     I reviewed prior external note(s) from ENT, ED, PCP  I reviewed the result(s) of the labs and imaging as noted above.   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 Continue symbicort 2 puffs twice a day. Gargle after use.  Continue albuterol inhaler as needed.  Continue the prilosec medication for reflux and voice hoarseness.  We will get some allergy testing today  We discussed disease management and progression at length today.    Return to Care: Return in about 6 weeks (around 09/23/2022).  14/03/2022, MD Pulmonary and Critical Care Medicine [facility] Office:[phone]  CC: [facility], [place]*

## 2022-08-12 NOTE — Patient Instructions (Addendum)
Please schedule follow up scheduled with APP in 6 weeks.  If my schedule is not open yet, we will contact you with a reminder closer to that time. Please call [phone] if you haven't heard from [place] a month before.   Before your next visit I would like you to have: Spirometry/Feno  We will get some allergy testing - you can get this on your way out today  Continue claritin but start taking daily. Continue symbicort 2 puffs twice a day. Gargle after use.  Continue albuterol inhaler as needed.  Continue the prilosec medication for reflux and voice hoarseness.    By learning about asthma and how it can be controlled, you take an important step toward managing this disease. Work closely with your asthma care team to learn all you can about your asthma, how to avoid triggers, what your medications do, and how to take them correctly. With proper care, you can live free of asthma symptoms and maintain a normal, healthy lifestyle.   What is asthma? Asthma is a chronic disease that affects the airways of the lungs. During normal breathing, the bands of muscle that surround the airways are relaxed and air moves freely. During an asthma episode or "attack," there are three main changes that stop air from moving easily through the airways: The bands of muscle that surround the airways tighten and make the airways narrow. This tightening is called bronchospasm.  The lining of the airways becomes swollen or inflamed.  The cells that line the airways produce more mucus, which is thicker than normal and clogs the airways.  These three factors - bronchospasm, inflammation, and mucus production - cause symptoms such as difficulty breathing, wheezing, and coughing.  What are the most common symptoms of asthma? Asthma symptoms are not the same for everyone. They can even change from episode to episode in the same person. Also, you may have only one symptom of asthma, such as cough, but another person may have  all the symptoms of asthma. It is important to know all the symptoms of asthma and to be aware that your asthma can present in any of these ways at any time. The most common symptoms include: Coughing, especially at night  Shortness of breath  Wheezing  Chest tightness, pain, or pressure   Who is affected by asthma? Asthma affects 22 million Americans; about 6 million of these are children under age 44. People who have a family history of asthma have an increased risk of developing the disease. Asthma is also more common in people who have allergies or who are exposed to tobacco smoke. However, anyone can develop asthma at any time. Some people may have asthma all of their lives, while others may develop it as adults.  What causes asthma? The airways in a person with asthma are very sensitive and react to many things, or "triggers." Contact with these triggers causes asthma symptoms. One of the most important parts of asthma control is to identify your triggers and then avoid them when possible. The only trigger you do not want to avoid is exercise. Pre-treatment with medicines before exercise can allow you to stay active yet avoid asthma symptoms. Common asthma triggers include: Infections (colds, viruses, flu, sinus infections)  Exercise  Weather (changes in temperature and/or humidity, cold air)  Tobacco smoke  Allergens (dust mites, pollens, pets, mold spores, cockroaches, and sometimes foods)  Irritants (strong odors from cleaning products, perfume, wood smoke, air pollution)  Strong emotions such as  crying or laughing hard  Some medications   How is asthma diagnosed? To diagnose asthma, your doctor will first review your medical history, family history, and symptoms. Your doctor will want to know any past history of breathing problems you may have had, as well as a family history of asthma, allergies, eczema (a bumpy, itchy skin rash caused by allergies), or other lung disease. It is  important that you describe your symptoms in detail (cough, wheeze, shortness of breath, chest tightness), including when and how often they occur. The doctor will perform a physical examination and listen to your heart and lungs. He or she may also order breathing tests, allergy tests, blood tests, and chest and sinus X-rays. The tests will find out if you do have asthma and if there are any other conditions that are contributing factors.  How is asthma treated? Asthma can be controlled, but not cured. It is not normal to have frequent symptoms, trouble sleeping, or trouble completing tasks. Appropriate asthma care will prevent symptoms and visits to the emergency room and hospital. Asthma medicines are one of the mainstays of asthma treatment. The drugs used to treat asthma are explained below.  Anti-inflammatories: These are the most important drugs for most people with asthma. Anti-inflammatory drugs reduce swelling and mucus production in the airways. As a result, airways are less sensitive and less likely to react to triggers. These medications need to be taken daily and may need to be taken for several weeks before they begin to control asthma. Anti-inflammatory medicines lead to fewer symptoms, better airflow, less sensitive airways, less airway damage, and fewer asthma attacks. If taken every day, they CONTROL or prevent asthma symptoms.   Bronchodilators: These drugs relax the muscle bands that tighten around the airways. This action opens the airways, letting more air in and out of the lungs and improving breathing. Bronchodilators also help clear mucus from the lungs. As the airways open, the mucus moves more freely and can be coughed out more easily. In short-acting forms, bronchodilators RELIEVE or stop asthma symptoms by quickly opening the airways and are very helpful during an asthma episode. In long-acting forms, bronchodilators provide CONTROL of asthma symptoms and prevent asthma  episodes.  Asthma drugs can be taken in a variety of ways. Inhaling the medications by using a metered dose inhaler, dry powder inhaler, or nebulizer is one way of taking asthma medicines. Oral medicines (pills or liquids you swallow) may also be prescribed.  Asthma severity Asthma is classified as either "intermittent" (comes and goes) or "persistent" (lasting). Persistent asthma is further described as being mild, moderate, or severe. The severity of asthma is based on how often you have symptoms both during the day and night, as well as by the results of lung function tests and by how well you can perform activities. The "severity" of asthma refers to how "intense" or "strong" your asthma is.  Asthma control Asthma control is the goal of asthma treatment. Regardless of your asthma severity, it may or may not be controlled. Asthma control means: You are able to do everything you want to do at work and home  You have no (or minimal) asthma symptoms  You do not wake up from your sleep or earlier than usual in the morning due to asthma  You rarely need to use your reliever medicine (inhaler)  Another major part of your treatment is that you are happy with your asthma care and believe your asthma is controlled.  Monitoring symptoms  A key part of treatment is keeping track of how well your lungs are working. Monitoring your symptoms, what they are, how and when they happen, and how severe they are, is an important part of being able to control your asthma.  Sometimes asthma is monitored using a peak flow meter. A peak flow (PF) meter measures how fast the air comes out of your lungs. It can help you know when your asthma is getting worse, sometimes even before you have symptoms. By taking daily peak flow readings, you can learn when to adjust medications to keep asthma under good control. It is also used to create your asthma action plan (see below). Your doctor can use your peak flow readings to adjust  your treatment plan in some cases.  Asthma Action Plan Based on your history and asthma severity, you and your doctor will develop a care plan called an "asthma action plan." The asthma action plan describes when and how to use your medicines, actions to take when asthma worsens, and when to seek emergency care. Make sure you understand this plan. If you do not, ask your asthma care provider any questions you may have. Your asthma action plan is one of the keys to controlling asthma. Keep it readily available to remind you of what you need to do every day to control asthma and what you need to do when symptoms occur.  Goals of asthma therapy These are the goals of asthma treatment: Live an active, normal life  Prevent chronic and troublesome symptoms  Attend work or school every day  Perform daily activities without difficulty  Stop urgent visits to the doctor, emergency department, or hospital  Use and adjust medications to control asthma with few or no side effects

## 2022-08-12 NOTE — Progress Notes (Signed)
HISTORY OF PRESENT ILLNESS:  Kristy [name] is a 81 y.o. female with past medical history as listed below who was evaluated May 20, 2022 regarding iron deficiency anemia and possible melena.  See that dictation for details.  Previous colonoscopic examinations were performed 2006, 2012, and 2019.  She subsequently underwent upper endoscopy May 29, 2022.  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  Because of complaints of dysphagia she was dilated with a balloon dilator to a maximal diameter of 20 mm.  She was also noted to have a large hiatal hernia with associated [place] erosions.  This was felt to be the cause of her iron deficiency anemia.  She was continued on omeprazole for GERD and recently prescribed daily iron supplementation.  Patient tells me that she has felt well since her procedure.  No reflux symptoms.  No further problems with dysphagia.  She is tolerating her iron once daily without issues.  She did have follow-up CBC and iron studies July 16, 2022.  Her hemoglobin had normalized from a low of 8.3 to the most recent value of 12.9.  Ferritin was also normal at 24.Marland Kitchen  GI review of systems is negative.  She has been having some ENT/pulmonary issues for which she was recently prescribed Z-Pak and is seeing a pulmonologist later today.  REVIEW OF SYSTEMS:  All non-GI ROS negative unless otherwise stated in the HPI except for arthritis, back pain, cough, itching, sore throat, voice change  Past Medical History:  Diagnosis Date   Allergic rhinitis, seasonal    Arthritis    Asthma    bronchial with weather change   Diabetes mellitus without complication (HCC)    type 2    Dysrhythmia    "Skipping a beat"   GERD (gastroesophageal reflux disease)    History of colon polyps    Hypertension    Pneumonia    Rotator cuff tear, right      Past Surgical History:  Procedure Laterality Date   BACK SURGERY     x3  ruptured disc    CHOLECYSTECTOMY OPEN  AGE 88   EYE SURGERY     bil cataracts   HERNIA REPAIR     INGUINAL HERNIA REPAIR Right AGE 88s   KNEE ARTHROSCOPY Left 2014   LUMBAR DISC SURGERY  x2  LAST DATE EARLY 2000s   ORIF ANKLE FRACTURE Right 06/24/2018   Procedure: OPEN REDUCTION INTERNAL FIXATION (ORIF) RIGHT ANKLE FRACTURE;  Surgeon: Kathryne Hitch, MD;  Location: [facility];  Service: Orthopedics;  Laterality: Right;   ROTATOR CUFF REPAIR Left 01/2015   TONSILLECTOMY  AGE 33   TOTAL KNEE ARTHROPLASTY Right 12/09/2020   Procedure: TOTAL KNEE ARTHROPLASTY;  Surgeon: Ollen Gross, MD;  Location: [facility];  Service: Orthopedics;  Laterality: Right;    VAGINAL HYSTERECTOMY  AGE 36   WITH BSO    Social History Kristy [name]  reports that she quit smoking about 33 years ago. Her smoking use included cigarettes. She has never used smokeless tobacco. She reports current alcohol use of about 3.0 - 4.0 standard drinks of alcohol per week. She reports that she does not use drugs.  family history is not on file.  Allergies  Allergen Reactions   Clonidine Hives       PHYSICAL EXAMINATION: Vital signs: BP 124/78   Pulse 74   Ht 5'  0.5" (1.537 m)   Wt 161 lb (73 kg)   BMI 30.93 kg/m  General: Well-developed, well-nourished, no acute distress HEENT: Sclerae are anicteric, conjunctiva pink. Oral mucosa intact Lungs: Clear Heart: Regular Abdomen: soft, nontender, nondistended, no obvious ascites, no peritoneal signs, normal bowel sounds. No organomegaly. Extremities: No edema Psychiatric: alert and oriented x3. Cooperative    ASSESSMENT:  1.  Iron deficiency anemia secondary to chronic blood loss from [facility] erosions.  On iron supplementation.  Hemoglobin has normalized 2.  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  GERD symptoms controlled on PPI. 3.  Dysphagia secondary to  esophageal stricture.  Improved post dilation 4.  Personal history of nonadvanced adenoma.  Multiple prior colonoscopies.  Last examination 2019 was negative for neoplasia.  Aged out of surveillance  PLAN:  1.  Stay on DAILY iron supplement INDEFINITELY.  If she comes off iron, iron deficiency anemia will return. 2.  Reflux precautions 3.  Continue PPI. 4.  Routine office follow-up in 1 year.  Contact the office in the interim for recurrent dysphagia, other problems, or questions. Total time of 30 minutes was spent preparing to see the patient, reviewing data, obtaining interval history, performing medically appropriate physical examination, ordering medication, counseling and educating the patient regarding the above listed issues, answering questions, and documenting clinical information in the health record

## 2022-08-12 NOTE — Patient Instructions (Signed)
_______________________________________________________  If you are age 83 or older, your body mass index should be between 23-30. Your Body mass index is 30.93 kg/m. If this is out of the aforementioned range listed, please consider follow up with your Primary Care Provider.  If you are age 26 or younger, your body mass index should be between 19-25. Your Body mass index is 30.93 kg/m. If this is out of the aformentioned range listed, please consider follow up with your Primary Care Provider.   ________________________________________________________  The [facility] providers would like to encourage you to use [facility] to communicate with providers for non-urgent requests or questions.  Due to long hold times on the telephone, sending your provider a message by [facility] may be a faster and more efficient way to get a response.  Please allow 48 business hours for a response.  Please remember that this is for non-urgent requests.  _______________________________________________________  Please follow up in one year

## 2022-08-13 LAB — INTERPRETATION:

## 2022-08-13 LAB — RESPIRATORY ALLERGY PROFILE REGION II ~~LOC~~

## 2022-08-25 ENCOUNTER — Other Ambulatory Visit: Payer: Medicare Other

## 2022-08-26 ENCOUNTER — Other Ambulatory Visit: Payer: Self-pay | Admitting: Internal Medicine

## 2022-08-26 DIAGNOSIS — E119 Type 2 diabetes mellitus without complications: Secondary | ICD-10-CM

## 2022-08-27 ENCOUNTER — Other Ambulatory Visit: Payer: Self-pay | Admitting: Internal Medicine

## 2022-08-27 DIAGNOSIS — J4521 Mild intermittent asthma with (acute) exacerbation: Secondary | ICD-10-CM

## 2022-08-27 DIAGNOSIS — M546 Pain in thoracic spine: Secondary | ICD-10-CM | POA: Diagnosis not present

## 2022-09-01 DIAGNOSIS — M545 Low back pain, unspecified: Secondary | ICD-10-CM | POA: Diagnosis not present

## 2022-09-01 DIAGNOSIS — M4854XD Collapsed vertebra, not elsewhere classified, thoracic region, subsequent encounter for fracture with routine healing: Secondary | ICD-10-CM | POA: Diagnosis not present

## 2022-09-06 IMAGING — CT CT NECK W/ CM
4 series · 16 of 33 positions shown, 19 images · IV contrast (iopamidol)
Comparison: 10/10/2021

CLINICAL DATA: Evaluate chronic sialoadenitis.  Cellulitis.

EXAM:
CT NECK WITH CONTRAST
TECHNIQUE: Multidetector CT imaging of the neck was performed using the
standard protocol following the bolus administration of intravenous
contrast.
CONTRAST:  75mL [id number] IOPAMIDOL (1VLR29-NWW) INJECTION 61%

[Series 3: neck 2.0 st · axial · 0.39mm/px · z∈[+1176,+1250]mm · 3 of 112 slices shown (1 of 3)]
[im 19/112  bone]
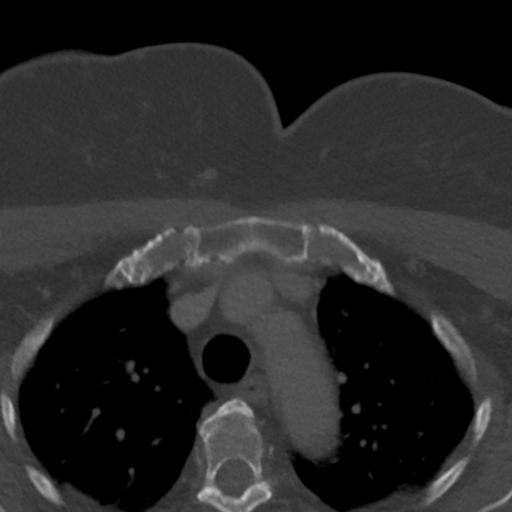
[im 38/112  bone]
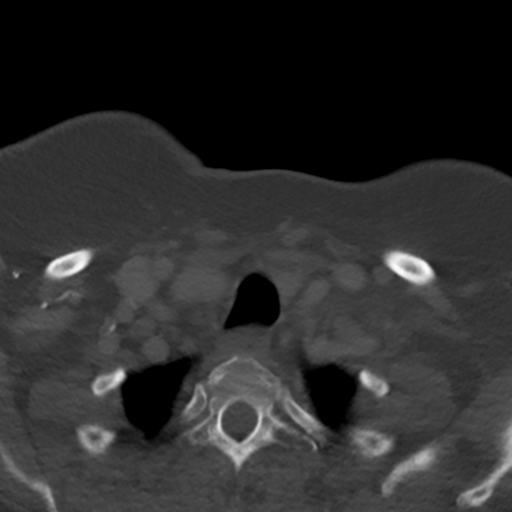
[im 56/112  bone]
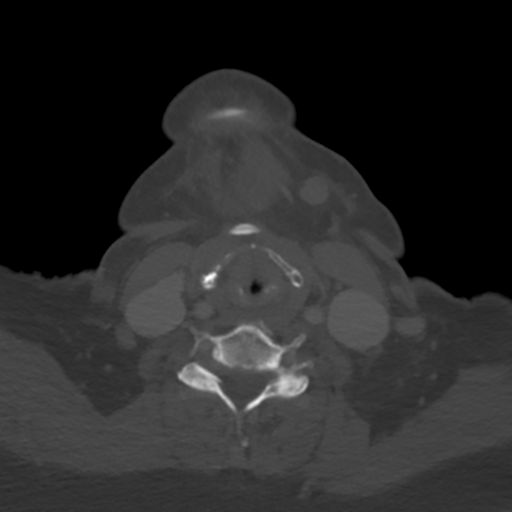

[Series 5: neck 2.0 st · sagittal · 0.46mm/px · 5 of 112 slices shown, 6 images (2 of 3)]
[im 38/112  bone]
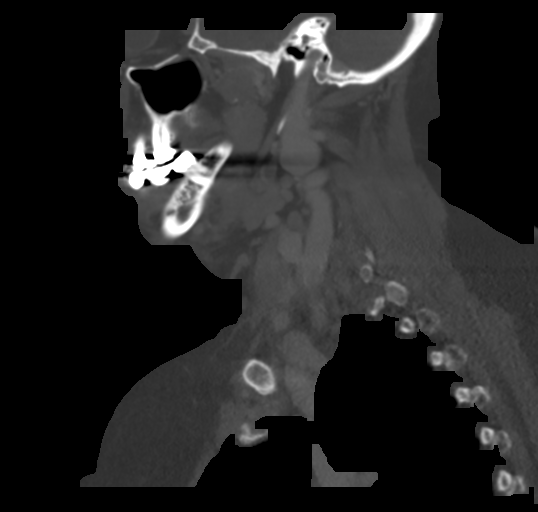
[im 47/112  bone]
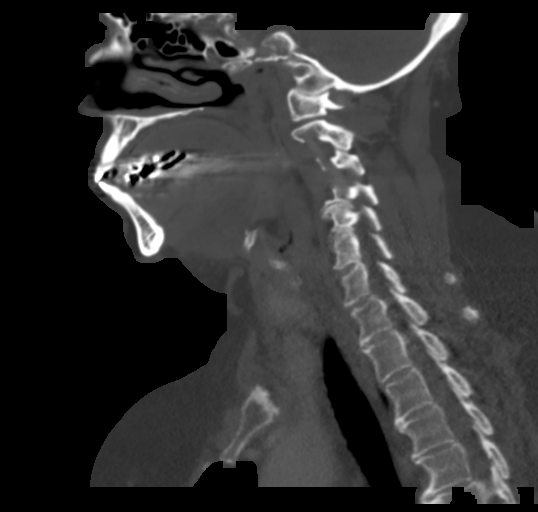
[im 56/112  soft-tissue]
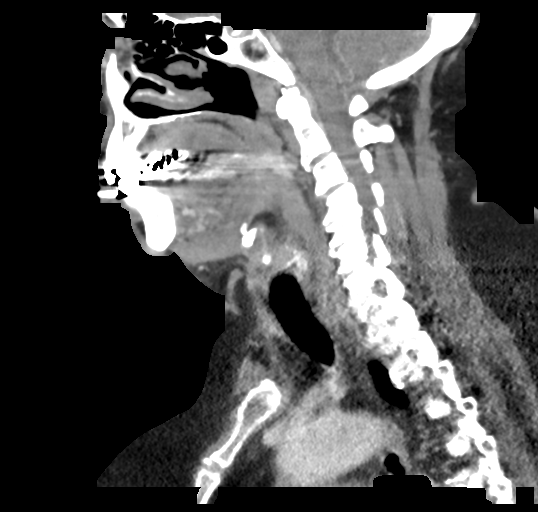
[im 56/112  bone]
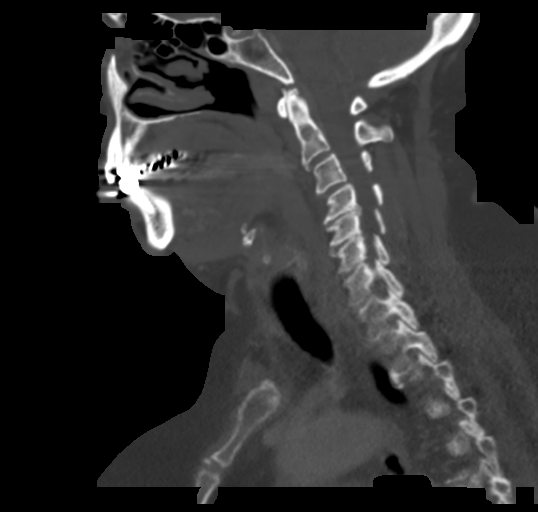
[im 65/112  bone]
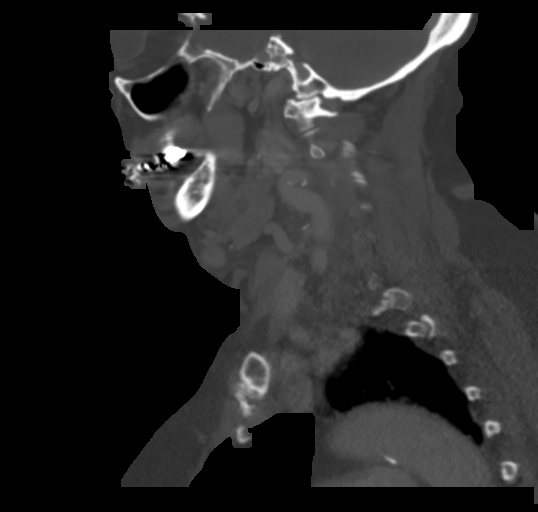
[im 75/112  bone]
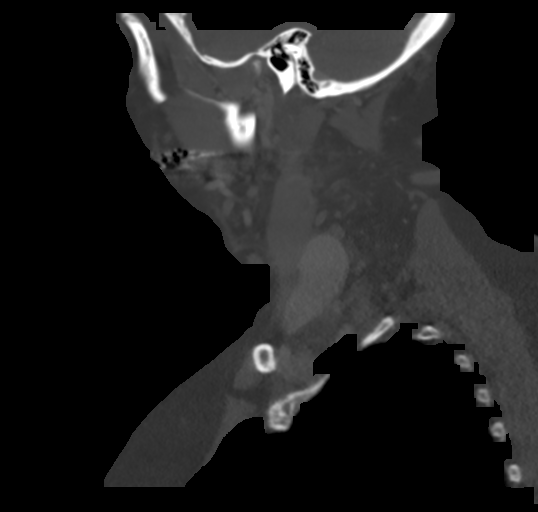

[Series 6: neck 2.0 st · coronal · 0.46mm/px · 3 of 109 slices shown (3 of 3)]
[im 22/109  bone]
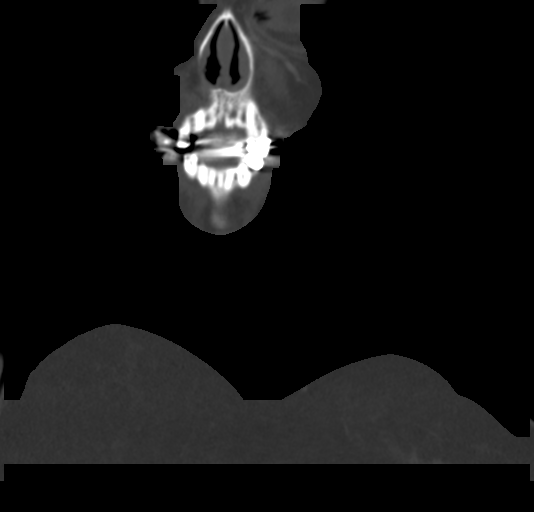
[im 44/109  bone]
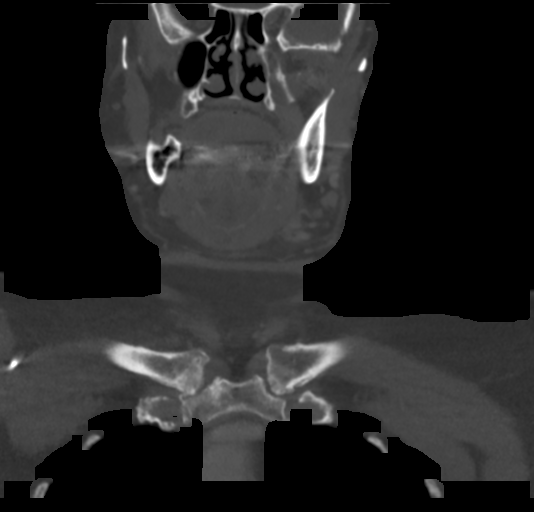
[im 65/109  bone]
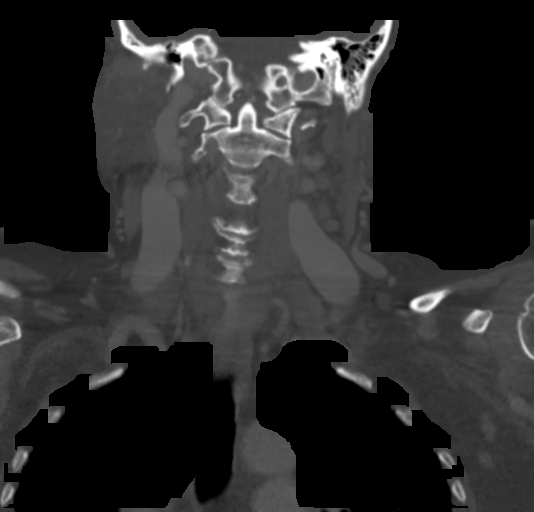

[Series 7: neck 2.0 st orthogonal · axial · 0.49mm/px · z∈[+1153,+1309]mm · 5 of 119 slices shown, 7 images]
[im 20/119  soft-tissue]
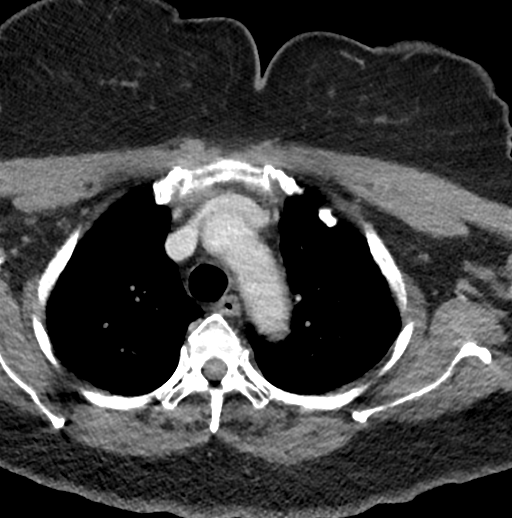
[im 20/119  bone]
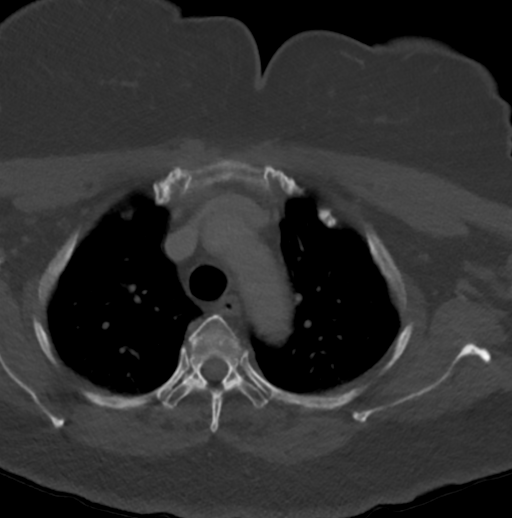
[im 40/119  bone]
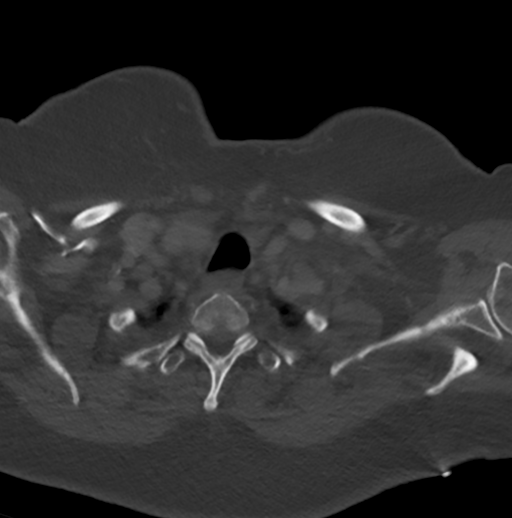
[im 60/119  bone]
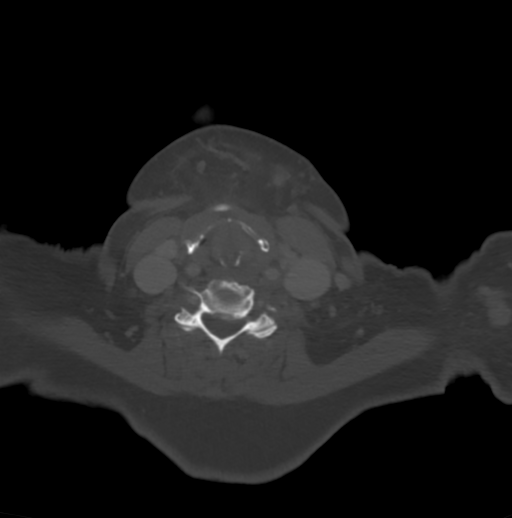
[im 79/119  bone]
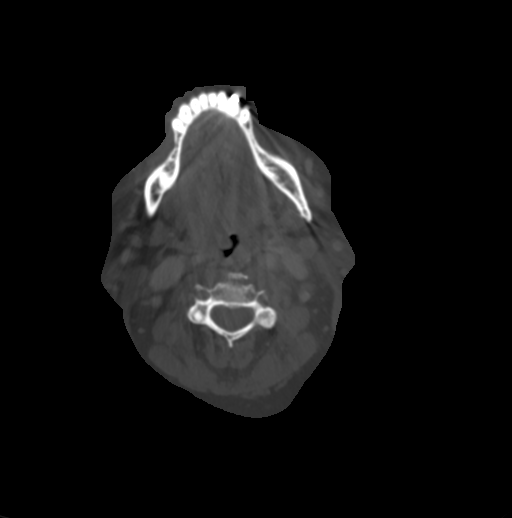
[im 99/119  soft-tissue]
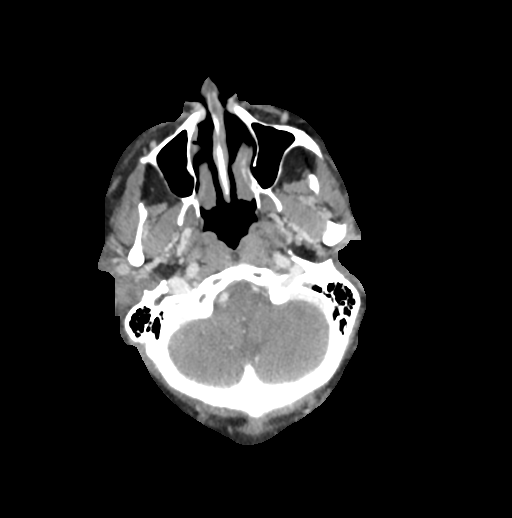
[im 99/119  bone]
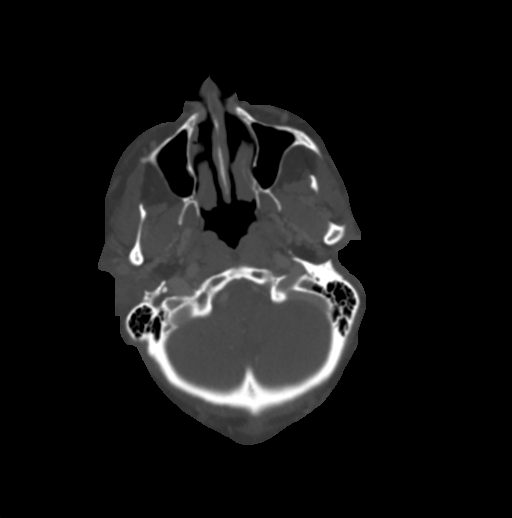

[16 of 33 positions shown; findings below may reference images not displayed]

FINDINGS: Pharynx and larynx: Submandibular and submental stranding with nodal
enlargement and platysma thickening which is similar to priors. Gas
in the low left oral cavity, presumably in the deep mucosal recess

Salivary glands: No acute inflammation seen in the salivary glands.
The anterior floor of mouth is obscured by streak artifact from
dental amalgam.

Thyroid: Normal.

Lymph nodes: Enlarged left more than right submandibular and left
upper jugular chain lymph nodes with adjacent fat stranding. The
left jugulodigastric node measures up to 17 mm in length.

Vascular: Negative.

Limited intracranial: Negative.

Visualized orbits: Negative.

Mastoids and visualized paranasal sinuses: Clear.

Skeleton: No acute or aggressive process.

Upper chest: Negative
IMPRESSION: Persisting adenopathy and fat inflammation in the submandibular and
left upper jugular regions. No remaining submandibular gland
inflammation noted today.

## 2022-09-10 ENCOUNTER — Other Ambulatory Visit: Payer: Self-pay | Admitting: Internal Medicine

## 2022-09-10 DIAGNOSIS — I10 Essential (primary) hypertension: Secondary | ICD-10-CM

## 2022-09-23 ENCOUNTER — Ambulatory Visit: Payer: Medicare Other | Admitting: Nurse Practitioner

## 2022-09-24 ENCOUNTER — Encounter (HOSPITAL_COMMUNITY): Payer: Self-pay | Admitting: Interventional Radiology

## 2022-09-24 ENCOUNTER — Telehealth (HOSPITAL_COMMUNITY): Payer: Self-pay

## 2022-09-24 NOTE — Telephone Encounter (Signed)
Ok per Dr. Corliss Skains for T12 kp/vp. AW

## 2022-09-28 ENCOUNTER — Other Ambulatory Visit (HOSPITAL_COMMUNITY): Payer: Self-pay | Admitting: Interventional Radiology

## 2022-09-28 DIAGNOSIS — S22080A Wedge compression fracture of T11-T12 vertebra, initial encounter for closed fracture: Secondary | ICD-10-CM

## 2022-10-01 ENCOUNTER — Inpatient Hospital Stay: Admission: RE | Admit: 2022-10-01 | Payer: Medicare Other | Source: Ambulatory Visit

## 2022-10-05 ENCOUNTER — Encounter: Payer: Self-pay | Admitting: Internal Medicine

## 2022-10-06 ENCOUNTER — Ambulatory Visit (INDEPENDENT_AMBULATORY_CARE_PROVIDER_SITE_OTHER): Payer: Medicare Other | Admitting: Nurse Practitioner

## 2022-10-06 ENCOUNTER — Encounter: Payer: Self-pay | Admitting: Nurse Practitioner

## 2022-10-06 ENCOUNTER — Ambulatory Visit (HOSPITAL_COMMUNITY)
Admission: RE | Admit: 2022-10-06 | Discharge: 2022-10-06 | Disposition: A | Discharge status: Home / Self Care | Payer: Medicare Other | Source: Ambulatory Visit | Attending: Interventional Radiology | Admitting: Interventional Radiology

## 2022-10-06 VITALS — BP 128/78 | HR 100 | Ht 61.0 in | Wt 154.0 lb

## 2022-10-06 DIAGNOSIS — J3089 Other allergic rhinitis: Secondary | ICD-10-CM | POA: Diagnosis not present

## 2022-10-06 DIAGNOSIS — J4540 Moderate persistent asthma, uncomplicated: Secondary | ICD-10-CM

## 2022-10-06 DIAGNOSIS — J387 Other diseases of larynx: Secondary | ICD-10-CM

## 2022-10-06 DIAGNOSIS — R490 Dysphonia: Secondary | ICD-10-CM | POA: Diagnosis not present

## 2022-10-06 DIAGNOSIS — R0609 Other forms of dyspnea: Secondary | ICD-10-CM

## 2022-10-06 DIAGNOSIS — S22080A Wedge compression fracture of T11-T12 vertebra, initial encounter for closed fracture: Secondary | ICD-10-CM | POA: Diagnosis not present

## 2022-10-06 LAB — CBC WITH DIFFERENTIAL/PLATELET
Basophils Absolute: 0.1 10*3/uL (ref 0.0–0.1)
Basophils Relative: 1 % (ref 0.0–3.0)
Eosinophils Absolute: 0.2 10*3/uL (ref 0.0–0.7)
Eosinophils Relative: 2.6 % (ref 0.0–5.0)
HCT: 40.7 % (ref 36.0–46.0)
Hemoglobin: 13.8 g/dL (ref 12.0–15.0)
Lymphocytes Relative: 31.8 % (ref 12.0–46.0)
Lymphs Abs: 2.5 10*3/uL (ref 0.7–4.0)
MCHC: 33.8 g/dL (ref 30.0–36.0)
MCV: 91.3 fl (ref 78.0–100.0)
Monocytes Absolute: 0.6 10*3/uL (ref 0.1–1.0)
Monocytes Relative: 7 % (ref 3.0–12.0)
Neutro Abs: 4.6 10*3/uL (ref 1.4–7.7)
Neutrophils Relative %: 57.6 % (ref 43.0–77.0)
Platelets: 391 10*3/uL (ref 150.0–400.0)
RBC: 4.45 Mil/uL (ref 3.87–5.11)
RDW: 14.3 % (ref 11.5–15.5)
WBC: 7.9 10*3/uL (ref 4.0–10.5)

## 2022-10-06 LAB — POCT EXHALED NITRIC OXIDE: FeNO level (ppb): 13

## 2022-10-06 MED ORDER — SPACER/AERO-HOLDING CHAMBERS DEVI: 2 refills | Status: AC

## 2022-10-06 NOTE — Patient Instructions (Addendum)
Continue claritin daily Continue symbicort 2 puffs twice a day. Brush tongue and rinse mouth afterwards. Add spacer  Continue Albuterol inhaler 2 puffs every 6 hours as needed for shortness of breath or wheezing. Notify if symptoms persist despite rescue inhaler/neb use.  Continue nasal sprays as directed by ENT Continue the prilosec medication for reflux and voice hoarseness.   Pulmonary function testing scheduled today  Labs - CBC with diff  May consider addition of singulair depending on your lab results. I have attached information on this medicaiton  Follow up after PFTs with Dr. Celine Mans or Florentina Addison Lewi Drost,NP. If symptoms do not improve or worsen, please contact office for sooner follow up or seek emergency care.

## 2022-10-06 NOTE — Progress Notes (Signed)
@Patient  ID: , female    DOB: 03/17/1941, 81 y.o.   MRN: [id number]  Chief Complaint  Patient presents with   Follow-up    Pt f/u she states that she feels well, breathing is okay.    Referring provider: [id number], Estel*  HPI: 81 year old female, former remote smoker followed for asthma and allergic rhinitis.  She is a patient of Dr. 91 and last seen in office 08/12/2022 for initial consult.  Past medical history significant for hypertension, history of chronic DVT of right femoral vein, GERD, DM 2, LPRD.  TEST/EVENTS:  11/19/2021 echocardiogram: EF 55 to 60%.  G1 DD.  RV size and function is normal.  Normal PASP.  Mild MR.  Mild AR. 07/16/2022 eos 400 08/12/2022 RAST negative  08/12/2022: OV with Dr. 08/14/2022.  Evaluation for shortness of breath.  Started having symptoms of recurrent bronchitis twice a year in her 12s.  This year has been getting different inhalers for recurrent episodes.  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  She is on Prilosec for this.  Bronchitis symptoms do get better with steroids.  Currently on Symbicort twice daily and albuterol.  Season changes do seem to trigger her asthma.  She was advised to start using Claritin daily.  She is continued on current regimen.  Allergy testing ordered which was negative.  Plan for spirometry and FeNO.  10/06/2022: Today-follow-up Patient presents today for 6-week follow-up.  She tells me that she is feeling about the same as she did when she was here last.  She was supposed to have spirometry today; unfortunately machines are down.  She still has daily symptoms of voice hoarseness and throat clearing.  She also gets short winded, mainly with strenuous activity, and occasionally notices some associated wheezing.  No significant cough right now.  She does not have any chest congestion, orthopnea,  lower extremity swelling.  Feels like her reflux symptoms are well-controlled.  She does not feel like she has any significant postnasal drainage.  No chest tightness/pain, palpitations, dizziness.  She is using her Symbicort twice daily.  Rarely uses albuterol.  Taking Claritin daily.  Allergies  Allergen Reactions   Clonidine Hives    Immunization History  Administered Date(s) Administered   Fluad Quad(high Dose 65+) 07/05/2019, 08/22/2020, 07/13/2022   Influenza Split 09/23/2011, 09/09/2013   Influenza Whole 10/04/2007, 08/07/2008, 09/16/2009   Influenza, High Dose Seasonal PF 10/04/2015, 08/05/2016, 09/14/2017, 09/08/2018   Influenza,inj,Quad PF,6+ Mos 08/07/2014   Influenza,inj,quad, With Preservative 02/05/2021   Influenza-Unspecified 08/05/2021   Moderna Sars-Covid-2 Vaccination 12/17/2019   PFIZER Comirnaty(Gray Top)Covid-19 Tri-Sucrose Vaccine 02/05/2021, 07/13/2022   PFIZER(Purple Top)SARS-COV-2 Vaccination 11/07/2019, 11/28/2019, 07/18/2020   PNEUMOCOCCAL CONJUGATE-20 02/05/2021, 07/13/2022   Pfizer Covid-19 Vaccine Bivalent Booster 27yrs & up 08/05/2021   Pneumococcal Conjugate-13 10/09/2013   Pneumococcal Polysaccharide-23 04/03/2016   Tetanus 10/09/2013    Past Medical History:  Diagnosis Date   Allergic rhinitis, seasonal    Arthritis    Asthma    bronchial with weather change   Diabetes mellitus without complication (HCC)    type 2    Dysrhythmia    "Skipping a beat"   GERD (gastroesophageal reflux disease)    History of colon polyps    Hypertension    Pneumonia    Rotator cuff tear, right     Tobacco History: Social History   Tobacco Use  Smoking Status Former  Years: 10.00   Types: Cigarettes   Quit date: 10/19/1988   Years since quitting: 33.9  Smokeless Tobacco Never   Counseling given: Not Answered   Outpatient Medications Prior to Visit  Medication Sig Dispense Refill   albuterol (VENTOLIN HFA) 108 (90 Base) MCG/ACT inhaler INHALE 2  PUFFS BY MOUTH EVERY 6 HOURS AS NEEDED FOR WHEEZING 6.7 each 1   amLODipine (NORVASC) 10 MG tablet TAKE 1 TABLET BY MOUTH EVERY DAY 90 tablet 1   benazepril (LOTENSIN) 40 MG tablet TAKE 1 TABLET BY MOUTH EVERY DAY 90 tablet 1   ferrous sulfate 325 (65 FE) MG tablet TAKE 1 TABLET BY MOUTH 2 TIMES DAILY WITH A MEAL. 100 tablet 2   hydrochlorothiazide (HYDRODIURIL) 12.5 MG tablet TAKE 1 TABLET (12.5 MG TOTAL) BY MOUTH AS NEEDED. FOR SWELLING 90 tablet 3   MELOXICAM PO Take by mouth.     metFORMIN (GLUCOPHAGE) 500 MG tablet TAKE 1 TABLET BY MOUTH TWICE A DAY WITH FOOD 180 tablet 1   metoprolol succinate (TOPROL-XL) 50 MG 24 hr tablet TAKE 1 TABLET (50 MG TOTAL) BY MOUTH DAILY. PATIENT NEED A APPOINTMENT FOR FUTURE REFILLS 2ND ATTEMPT 90 tablet 1   omeprazole (PRILOSEC) 20 MG capsule Take 1 capsule (20 mg total) by mouth 2 (two) times daily before a meal. 180 capsule 3   SYMBICORT 160-4.5 MCG/ACT inhaler INHALE 2 PUFFS INTO THE LUNGS TWICE A DAY 10.2 each 3   traMADol (ULTRAM) 50 MG tablet Take by mouth every 6 (six) hours as needed.     No facility-administered medications prior to visit.     Review of Systems:   Constitutional: No weight loss or gain, night sweats, fevers, chills, fatigue, or lassitude. HEENT: No headaches, difficulty swallowing, tooth/dental problems, or sore throat. No sneezing, itching, ear ache, nasal congestion, or post nasal drip. + Voice hoarseness, throat clearing CV:  No chest pain, orthopnea, PND, swelling in lower extremities, anasarca, dizziness, palpitations, syncope Resp:  +shortness of breath with strenuous exertion; occasional wheeze. No excess mucus or change in color of mucus. No productive or non-productive cough. No hemoptysis.  No chest wall deformity GI:  No heartburn, indigestion, abdominal pain, nausea, vomiting, diarrhea, change in bowel habits, loss of appetite, bloody stools.  GU: No dysuria, change in color of urine, urgency or frequency.  Skin: No  rash, lesions, ulcerations MSK:  No joint pain or swelling.   Neuro: No dizziness or lightheadedness.  Psych: No depression or anxiety. Mood stable.     Physical Exam:  BP 128/78   Pulse 100   Ht 5\' 1"  (1.549 m)   Wt 154 lb (69.9 kg)   SpO2 98%   BMI 29.10 kg/m   GEN: Pleasant, interactive, well-appearing; in no acute distress HEENT:  Normocephalic and atraumatic. PERRLA. Sclera white. Nasal turbinates pink, moist and patent bilaterally. No rhinorrhea present. Oropharynx erythematous and moist, without exudate or edema. No lesions, ulcerations NECK:  Supple w/ fair ROM. No JVD present. Normal carotid impulses w/o bruits. Thyroid symmetrical with no goiter or nodules palpated. No lymphadenopathy.   CV: RRR, no m/r/g, no peripheral edema. Pulses intact, +2 bilaterally. No cyanosis, pallor or clubbing. PULMONARY:  Unlabored, regular breathing. Clear bilaterally A&P w/o wheezes/rales/rhonchi. No accessory muscle use.  GI: BS present and normoactive. Soft, non-tender to palpation. No organomegaly or masses detected.  MSK: No erythema, warmth or tenderness. Cap refil <2 sec all extrem. No deformities or joint swelling noted.  Neuro: A/Ox3. No focal deficits noted.  Skin: Warm, no lesions or rashe Psych: Normal affect and behavior. Judgement and thought content appropriate.     Lab Results:  CBC    Component Value Date/Time   WBC 6.1 07/16/2022 0938   RBC 4.54 07/16/2022 0938   HGB 12.9 07/16/2022 0938   HCT 38.8 07/16/2022 0938   PLT 282.0 07/16/2022 0938   MCV 85.6 07/16/2022 0938   MCH 29.6 09/06/2021 0301   MCHC 33.1 07/16/2022 0938   RDW 19.8 (H) 07/16/2022 0938   LYMPHSABS 1.9 07/16/2022 0938   MONOABS 0.5 07/16/2022 0938   EOSABS 0.4 07/16/2022 0938   BASOSABS 0.1 07/16/2022 0938    BMET    Component Value Date/Time   NA 138 07/16/2022 0938   NA 139 03/13/2020 1458   K 3.8 07/16/2022 0938   CL 102 07/16/2022 0938   CO2 27 07/16/2022 0938   GLUCOSE 99  07/16/2022 0938   GLUCOSE 82 09/30/2006 0943   BUN 22 07/16/2022 0938   BUN 17 03/13/2020 1458   CREATININE 0.88 07/16/2022 0938   CALCIUM 10.0 07/16/2022 0938   GFRNONAA >60 09/12/2021 1501   GFRAA >60 03/15/2020 1141    BNP No results found for: "BNP"   Imaging:  No results found.        No data to display          No results found for: "NITRICOXIDE"      Assessment & Plan:   Dyspnea on exertion Longstanding history of asthma; on high dose Symbicort. She has daily symptoms of voice hoarseness, dyspnea and wheezing, which I am unclear if this is from her asthma or upper airway. No significant cough currently but does have history of recurrent bronchitis. Lung exam today is clear and FeNO is normal. We did discuss that hoarseness is not related to her asthma but can be caused by ICS; although, it sounds like these symptoms started prior to her using inhaler consistently. She also has LPRD, which is treated with omeprazole. Her constellation of symptoms makes me concerned for VCD, which she tells me was ruled out by ENT. She needs pulmonary function testing for further evaluation.   Patient Instructions  Continue claritin daily Continue symbicort 2 puffs twice a day. Brush tongue and rinse mouth afterwards. Add spacer  Continue Albuterol inhaler 2 puffs every 6 hours as needed for shortness of breath or wheezing. Notify if symptoms persist despite rescue inhaler/neb use.  Continue nasal sprays as directed by ENT Continue the prilosec medication for reflux and voice hoarseness.   Pulmonary function testing scheduled today  Labs - CBC with diff  May consider addition of singulair depending on your lab results. I have attached information on this medicaiton  Follow up after PFTs with Dr. Celine Mans or Florentina Addison Freja Faro,NP. If symptoms do not improve or worsen, please contact office for sooner follow up or seek emergency care.    Asthma Currently treated with high dose  Symbicort. Rare use of albuterol. Unclear if symptoms are related to poorly controlled asthma vs upper airway cause. See above plan. Repeat CBC with diff today; if eosinophils remain elevated, we will add on singulair. Medication reviewed today. Could consider biologic therapy in the future.   Allergic rhinitis No significant URI symptoms per her report. She is compliant with nasal sprays as directed by ENT. Allergen panel and IgE were nl.  Irritable larynx See above.    I spent 35 minutes of dedicated to the care of this patient on the date of this  encounter to include pre-visit review of records, face-to-face time with the patient discussing conditions above, post visit ordering of testing, clinical documentation with the electronic health record, making appropriate referrals as documented, and communicating necessary findings to members of the patients care team.  Noemi ChapelKatherine V Pavlos Yon, NP 10/06/2022  Pt aware and understands NP's role.

## 2022-10-06 NOTE — Assessment & Plan Note (Signed)
Currently treated with high dose Symbicort. Rare use of albuterol. Unclear if symptoms are related to poorly controlled asthma vs upper airway cause. See above plan. Repeat CBC with diff today; if eosinophils remain elevated, we will add on singulair. Medication reviewed today. Could consider biologic therapy in the future.

## 2022-10-06 NOTE — Assessment & Plan Note (Signed)
See above

## 2022-10-06 NOTE — Assessment & Plan Note (Addendum)
Longstanding history of asthma; on high dose Symbicort. She has daily symptoms of voice hoarseness, dyspnea and wheezing, which I am unclear if this is from her asthma or upper airway. No significant cough currently but does have history of recurrent bronchitis. Lung exam today is clear and FeNO is normal. We did discuss that hoarseness is not related to her asthma but can be caused by ICS; although, it sounds like these symptoms started prior to her using inhaler consistently. She also has LPRD, which is treated with omeprazole. Her constellation of symptoms makes me concerned for VCD, which she tells me was ruled out by ENT. She needs pulmonary function testing for further evaluation.   Patient Instructions  Continue claritin daily Continue symbicort 2 puffs twice a day. Brush tongue and rinse mouth afterwards. Add spacer  Continue Albuterol inhaler 2 puffs every 6 hours as needed for shortness of breath or wheezing. Notify if symptoms persist despite rescue inhaler/neb use.  Continue nasal sprays as directed by ENT Continue the prilosec medication for reflux and voice hoarseness.   Pulmonary function testing scheduled today  Labs - CBC with diff  May consider addition of singulair depending on your lab results. I have attached information on this medicaiton  Follow up after PFTs with Kristy [name] or Kristy Addison Trinidy Masterson,NP. If symptoms do not improve or worsen, please contact office for sooner follow up or seek emergency care.

## 2022-10-06 NOTE — Assessment & Plan Note (Addendum)
No significant URI symptoms per her report. She is compliant with nasal sprays as directed by ENT. Allergen panel and IgE were nl.

## 2022-10-07 HISTORY — PX: IR RADIOLOGIST EVAL & MGMT: IMG5224

## 2022-10-13 ENCOUNTER — Encounter (HOSPITAL_COMMUNITY): Payer: Self-pay | Admitting: Interventional Radiology

## 2022-10-14 DIAGNOSIS — Z79899 Other long term (current) drug therapy: Secondary | ICD-10-CM | POA: Diagnosis not present

## 2022-10-14 DIAGNOSIS — M810 Age-related osteoporosis without current pathological fracture: Secondary | ICD-10-CM | POA: Diagnosis not present

## 2022-10-14 DIAGNOSIS — M154 Erosive (osteo)arthritis: Secondary | ICD-10-CM | POA: Diagnosis not present

## 2022-10-14 DIAGNOSIS — D509 Iron deficiency anemia, unspecified: Secondary | ICD-10-CM | POA: Diagnosis not present

## 2022-10-14 DIAGNOSIS — S22000A Wedge compression fracture of unspecified thoracic vertebra, initial encounter for closed fracture: Secondary | ICD-10-CM | POA: Diagnosis not present

## 2022-10-14 DIAGNOSIS — E663 Overweight: Secondary | ICD-10-CM | POA: Diagnosis not present

## 2022-10-14 DIAGNOSIS — K111 Hypertrophy of salivary gland: Secondary | ICD-10-CM | POA: Diagnosis not present

## 2022-10-14 DIAGNOSIS — Z6829 Body mass index (BMI) 29.0-29.9, adult: Secondary | ICD-10-CM | POA: Diagnosis not present

## 2022-10-14 DIAGNOSIS — M1991 Primary osteoarthritis, unspecified site: Secondary | ICD-10-CM | POA: Diagnosis not present

## 2022-10-21 ENCOUNTER — Other Ambulatory Visit: Payer: Self-pay

## 2022-10-21 ENCOUNTER — Ambulatory Visit (INDEPENDENT_AMBULATORY_CARE_PROVIDER_SITE_OTHER): Payer: Medicare Other | Admitting: Internal Medicine

## 2022-10-21 ENCOUNTER — Other Ambulatory Visit (HOSPITAL_COMMUNITY): Payer: Self-pay | Admitting: Interventional Radiology

## 2022-10-21 ENCOUNTER — Other Ambulatory Visit (INDEPENDENT_AMBULATORY_CARE_PROVIDER_SITE_OTHER): Payer: Medicare Other

## 2022-10-21 DIAGNOSIS — J4540 Moderate persistent asthma, uncomplicated: Secondary | ICD-10-CM

## 2022-10-21 DIAGNOSIS — S22080A Wedge compression fracture of T11-T12 vertebra, initial encounter for closed fracture: Secondary | ICD-10-CM

## 2022-10-21 DIAGNOSIS — R490 Dysphonia: Secondary | ICD-10-CM

## 2022-10-21 LAB — PULMONARY FUNCTION TEST
DL/VA % pred: 138 %
DL/VA: 5.76 ml/min/mmHg/L
DLCO cor % pred: 91 %
DLCO cor: 15.31 ml/min/mmHg
DLCO unc % pred: 92 %
DLCO unc: 15.5 ml/min/mmHg
FEF 25-75 Post: 1.57 L/sec
FEF 25-75 Pre: 1.7 L/sec
FEF2575-%Change-Post: -7 %
FEF2575-%Pred-Post: 144 %
FEF2575-%Pred-Pre: 156 %
FEV1-%Change-Post: 0 %
FEV1-%Pred-Post: 87 %
FEV1-%Pred-Pre: 87 %
FEV1-Post: 1.35 L
FEV1-Pre: 1.36 L
FEV1FVC-%Change-Post: -1 %
FEV1FVC-%Pred-Pre: 116 %
FEV6-%Change-Post: 1 %
FEV6-%Pred-Post: 81 %
FEV6-%Pred-Pre: 79 %
FEV6-Post: 1.61 L
FEV6-Pre: 1.58 L
FEV6FVC-%Pred-Post: 106 %
FEV6FVC-%Pred-Pre: 106 %
FVC-%Change-Post: 1 %
FVC-%Pred-Post: 76 %
FVC-%Pred-Pre: 74 %
FVC-Post: 1.61 L
FVC-Pre: 1.59 L
Post FEV1/FVC ratio: 84 %
Post FEV6/FVC ratio: 100 %
Pre FEV1/FVC ratio: 86 %
Pre FEV6/FVC Ratio: 100 %
RV % pred: 30 %
RV: 0.69 L
TLC % pred: 55 %
TLC: 2.54 L

## 2022-10-21 LAB — POCT EXHALED NITRIC OXIDE: FeNO level (ppb): 15

## 2022-10-21 NOTE — Progress Notes (Addendum)
Full PFT performed today. °

## 2022-10-21 NOTE — Patient Instructions (Addendum)
Full PFT performed today. °

## 2022-10-27 ENCOUNTER — Ambulatory Visit (HOSPITAL_COMMUNITY): Payer: Medicare Other

## 2022-10-28 ENCOUNTER — Other Ambulatory Visit: Payer: Self-pay | Admitting: Radiology

## 2022-10-28 DIAGNOSIS — M549 Dorsalgia, unspecified: Secondary | ICD-10-CM

## 2022-10-29 ENCOUNTER — Other Ambulatory Visit: Payer: Self-pay | Admitting: Student

## 2022-10-30 ENCOUNTER — Other Ambulatory Visit: Payer: Self-pay

## 2022-10-30 ENCOUNTER — Ambulatory Visit (HOSPITAL_COMMUNITY): Payer: Medicare Other

## 2022-10-30 ENCOUNTER — Ambulatory Visit (HOSPITAL_COMMUNITY)
Admission: RE | Admit: 2022-10-30 | Discharge: 2022-10-30 | Disposition: A | Discharge status: Home / Self Care | Payer: Medicare Other | Source: Ambulatory Visit | Attending: Interventional Radiology | Admitting: Interventional Radiology

## 2022-10-30 DIAGNOSIS — W19XXXA Unspecified fall, initial encounter: Secondary | ICD-10-CM | POA: Diagnosis not present

## 2022-10-30 DIAGNOSIS — S22080A Wedge compression fracture of T11-T12 vertebra, initial encounter for closed fracture: Secondary | ICD-10-CM | POA: Diagnosis not present

## 2022-10-30 DIAGNOSIS — M549 Dorsalgia, unspecified: Secondary | ICD-10-CM

## 2022-10-30 DIAGNOSIS — M4854XA Collapsed vertebra, not elsewhere classified, thoracic region, initial encounter for fracture: Secondary | ICD-10-CM | POA: Diagnosis not present

## 2022-10-30 HISTORY — PX: IR KYPHO THORACIC WITH BONE BIOPSY: IMG5518

## 2022-10-30 LAB — CBC
HCT: 38.5 % (ref 36.0–46.0)
Hemoglobin: 12.6 g/dL (ref 12.0–15.0)
MCH: 30.6 pg (ref 26.0–34.0)
MCHC: 32.7 g/dL (ref 30.0–36.0)
MCV: 93.4 fL (ref 80.0–100.0)
Platelets: 327 10*3/uL (ref 150–400)
RBC: 4.12 MIL/uL (ref 3.87–5.11)
RDW: 13 % (ref 11.5–15.5)
WBC: 7.6 10*3/uL (ref 4.0–10.5)
nRBC: 0 % (ref 0.0–0.2)

## 2022-10-30 LAB — BASIC METABOLIC PANEL
Anion gap: 9 (ref 5–15)
BUN: 11 mg/dL (ref 8–23)
CO2: 30 mmol/L (ref 22–32)
Calcium: 9.6 mg/dL (ref 8.9–10.3)
Chloride: 98 mmol/L (ref 98–111)
Creatinine, Ser: 0.73 mg/dL (ref 0.44–1.00)
GFR, Estimated: >60 mL/min (ref >60)
Glucose, Bld: 111 mg/dL — ABNORMAL HIGH (ref 70–99)
Potassium: 3.5 mmol/L (ref 3.5–5.1)
Sodium: 137 mmol/L (ref 135–145)

## 2022-10-30 LAB — GLUCOSE, CAPILLARY: Glucose-Capillary: 121 mg/dL — ABNORMAL HIGH (ref 70–99)

## 2022-10-30 LAB — PROTIME-INR
INR: 1 (ref 0.8–1.2)
Prothrombin Time: 13 seconds (ref 11.4–15.2)

## 2022-10-30 MED ORDER — FENTANYL CITRATE (PF) 100 MCG/2ML IJ SOLN
INTRAMUSCULAR | Status: AC | PRN
  Administered 2022-10-30 (×2): 50 ug via INTRAVENOUS

## 2022-10-30 MED ORDER — CEFAZOLIN SODIUM-DEXTROSE 2-4 GM/100ML-% IV SOLN
INTRAVENOUS | Status: AC
  Filled 2022-10-30: qty 100

## 2022-10-30 MED ORDER — MIDAZOLAM HCL 2 MG/2ML IJ SOLN
INTRAMUSCULAR | Status: AC
  Filled 2022-10-30: qty 2

## 2022-10-30 MED ORDER — FENTANYL CITRATE (PF) 100 MCG/2ML IJ SOLN
INTRAMUSCULAR | Status: AC
  Filled 2022-10-30: qty 4

## 2022-10-30 MED ORDER — BUPIVACAINE HCL (PF) 0.5 % IJ SOLN
INTRAMUSCULAR | Status: AC
  Filled 2022-10-30: qty 30

## 2022-10-30 MED ORDER — CEFAZOLIN SODIUM-DEXTROSE 2-4 GM/100ML-% IV SOLN: 2.0000 g | INTRAVENOUS | Status: DC

## 2022-10-30 MED ORDER — SODIUM CHLORIDE 0.9 % IV SOLN: INTRAVENOUS | Status: DC

## 2022-10-30 MED ORDER — KETOROLAC TROMETHAMINE 30 MG/ML IJ SOLN
INTRAMUSCULAR | Status: AC
  Filled 2022-10-30: qty 1

## 2022-10-30 MED ORDER — KETOROLAC TROMETHAMINE 30 MG/ML IJ SOLN: 30.0000 mg | INTRAMUSCULAR | Status: DC | PRN

## 2022-10-30 MED ORDER — SODIUM CHLORIDE 0.9 % IV SOLN: INTRAVENOUS | Status: AC

## 2022-10-30 MED ORDER — CEFAZOLIN SODIUM-DEXTROSE 2-4 GM/100ML-% IV SOLN
INTRAVENOUS | Status: AC | PRN
  Administered 2022-10-30: 2 g via INTRAVENOUS

## 2022-10-30 MED ORDER — KETOROLAC TROMETHAMINE 30 MG/ML IJ SOLN: 30.0000 mg | Freq: Four times a day (QID) | INTRAMUSCULAR | Status: DC

## 2022-10-30 MED ORDER — KETOROLAC TROMETHAMINE 30 MG/ML IJ SOLN
INTRAMUSCULAR | Status: AC | PRN
  Administered 2022-10-30: 30 mg via INTRAVENOUS

## 2022-10-30 MED ORDER — TOBRAMYCIN SULFATE 1.2 G IJ SOLR
INTRAMUSCULAR | Status: AC
  Filled 2022-10-30: qty 1.2

## 2022-10-30 MED ORDER — MIDAZOLAM HCL 2 MG/2ML IJ SOLN
INTRAMUSCULAR | Status: AC | PRN
  Administered 2022-10-30 (×2): 1 mg via INTRAVENOUS
  Administered 2022-10-30 (×2): .5 mg via INTRAVENOUS
  Administered 2022-10-30: 1 mg via INTRAVENOUS
  Administered 2022-10-30 (×2): .5 mg via INTRAVENOUS

## 2022-10-30 MED ORDER — MIDAZOLAM HCL 2 MG/2ML IJ SOLN
INTRAMUSCULAR | Status: AC
  Filled 2022-10-30: qty 4

## 2022-10-30 NOTE — Procedures (Signed)
INR.  S/P T 12 balloon KP bi pedicular approach.  S.Jordan Pardini MD

## 2022-10-30 NOTE — Sedation Documentation (Signed)
Attempted to call [facility] to SS.  

## 2022-10-30 NOTE — Sedation Documentation (Signed)
Pt c/o 8/10 back pain at [place] site. Dr. Estanislado Pandy notified. Orders received, see MAR.

## 2022-10-30 NOTE — Discharge Instructions (Addendum)
INR.  1.  No stooping, bending or lifting weights above 10 pounds for 2 weeks.  2.  Use a walker to ambulate for 2 weeks.  3.  No driving for 2 weeks.  4.  RTC as needed 2 weeks.

## 2022-10-30 NOTE — H&P (Cosign Needed Addendum)
Chief Complaint: Patient was seen in consultation today for traumatic compression fracture of T12 at the request of [place]  Referring Physician(s): Deveshwar,Sanjeev  Supervising Physician: Luanne Bras  Patient Status: [facility] - Out-pt  History of Present Illness: Kristy [name] is a 82 y.o. female outpatient who was referred for evaluation and management of a compression fracture of the T12 vertebral body seen on MRI scan of the thoracic spine.  She was seen in consultation with Dr. Estanislado Pandy in [facility] 2023.   Patient reports history of a fall down nine steps resulting in clavicular fracture and back pain one year ago.  Back pain has been chronic and consistent since that time limiting prolonged standing, bending, or straining.  The pain limits her ability to do routine daily activities, including cooking. She uses tramadol, Tylenol, and lidocaine patches, with suboptimal relief.  Denies radiating pain, autonomic dysfunction of her bowel or bladder.   She denies recent chills, fever or rigors, change in appetite or weight, N/V, change in bowel or bladder habits, chest pain, SOB, palpitations, or cough   Past Medical History:  Diagnosis Date   Allergic rhinitis, seasonal    Arthritis    Asthma    bronchial with weather change   Diabetes mellitus without complication ([place])    type 2    Dysrhythmia    "Skipping a beat"   GERD (gastroesophageal reflux disease)    History of colon polyps    Hypertension    Pneumonia    Rotator cuff tear, right     Past Surgical History:  Procedure Laterality Date   BACK SURGERY     x3  ruptured disc    CHOLECYSTECTOMY OPEN  AGE 64   EYE SURGERY     bil cataracts   HERNIA REPAIR     INGUINAL HERNIA REPAIR Right AGE 29s   IR RADIOLOGIST EVAL & MGMT  10/07/2022   KNEE ARTHROSCOPY Left 2014   LUMBAR [place] SURGERY  x2  LAST DATE EARLY 2000s   ORIF ANKLE FRACTURE Right 06/24/2018   Procedure: OPEN REDUCTION INTERNAL FIXATION  (ORIF) RIGHT ANKLE FRACTURE;  Surgeon: Mcarthur Rossetti, MD;  Location: [facility] OR[facility];  Service: Orthopedics;  Laterality: Right;   ROTATOR CUFF REPAIR Left 01/2015   TONSILLECTOMY  AGE 61   TOTAL KNEE ARTHROPLASTY Right 12/09/2020   Procedure: TOTAL KNEE ARTHROPLASTY;  Surgeon: Gaynelle Arabian, MD;  Location: [facility];  Service: Orthopedics;  Laterality: Right;  23min   VAGINAL HYSTERECTOMY  AGE 61   WITH BSO    Allergies: Clonidine  Medications: Prior to Admission medications   Medication Sig Start Date End Date Taking? Authorizing Provider  albuterol (VENTOLIN HFA) 108 (90 Base) MCG/ACT inhaler INHALE 2 PUFFS BY MOUTH EVERY 6 HOURS AS NEEDED FOR WHEEZING 02/09/22  Yes Isaac Bliss, Rayford Halsted, MD  amLODipine (NORVASC) 10 MG tablet TAKE 1 TABLET BY MOUTH EVERY DAY 09/14/22  Yes Isaac Bliss, Rayford Halsted, MD  benazepril (LOTENSIN) 40 MG tablet TAKE 1 TABLET BY MOUTH EVERY DAY 08/03/22  Yes Isaac Bliss, Rayford Halsted, MD  ferrous sulfate 325 (65 FE) MG tablet TAKE 1 TABLET BY MOUTH 2 TIMES DAILY WITH A MEAL. 07/20/22  Yes Isaac Bliss, Rayford Halsted, MD  hydrochlorothiazide (HYDRODIURIL) 12.5 MG tablet TAKE 1 TABLET (12.5 MG TOTAL) BY MOUTH AS NEEDED. FOR SWELLING 03/17/22  Yes Troy Sine, MD  MELOXICAM PO Take by mouth.   Yes [provider]  metFORMIN (GLUCOPHAGE) 500 MG tablet TAKE 1 TABLET BY  MOUTH TWICE A DAY WITH FOOD 08/26/22  Yes Isaac Bliss, Rayford Halsted, MD  metoprolol succinate (TOPROL-XL) 50 MG 24 hr tablet TAKE 1 TABLET (50 MG TOTAL) BY MOUTH DAILY. PATIENT NEED A APPOINTMENT FOR FUTURE REFILLS 2ND ATTEMPT 07/24/22  Yes Troy Sine, MD  omeprazole (PRILOSEC) 20 MG capsule Take 1 capsule (20 mg total) by mouth 2 (two) times daily before a meal. 05/20/22  Yes Irene Shipper, MD  SYMBICORT 160-4.5 MCG/ACT inhaler INHALE 2 PUFFS INTO THE LUNGS TWICE A DAY 08/27/22  Yes Isaac Bliss, Rayford Halsted, MD  traMADol (ULTRAM) 50 MG tablet Take by mouth every 6 (six) hours as needed.    Yes [provider]  Spacer/Aero-Holding Dorise Bullion Use with Symbicort inhaler 10/06/22   Belenda Cruise, Karie Schwalbe, NP     Family History  Problem Relation Age of Onset   Colon cancer Neg Hx    Esophageal cancer Neg Hx    Rectal cancer Neg Hx    Stomach cancer Neg Hx    Asthma Neg Hx        Review of Systems: A 12 point ROS discussed and pertinent positives are indicated in the HPI above.  All other systems are negative.  Vital Signs: BP (!) 144/86   Pulse 75   Temp (!) 97.2 F (36.2 C) (Temporal)   Resp 16   Ht 5' 0.5" (1.537 m)   Wt 160 lb (72.6 kg)   SpO2 97%   BMI 30.73 kg/m   Physical Exam Vitals reviewed.  Constitutional:      General: She is not in acute distress.    Appearance: She is not ill-appearing.  HENT:     Head: Normocephalic and atraumatic.     Mouth/Throat:     Mouth: Mucous membranes are moist.     Pharynx: Oropharynx is clear.  Eyes:     General: No scleral icterus.    Extraocular Movements: Extraocular movements intact.  Cardiovascular:     Rate and Rhythm: Normal rate and regular rhythm.     Pulses: Normal pulses.  Pulmonary:     Effort: Pulmonary effort is normal. No respiratory distress.  Abdominal:     General: Abdomen is flat.     Palpations: Abdomen is soft.  Skin:    General: Skin is warm and dry.  Neurological:     General: No focal deficit present.     Mental Status: She is alert and oriented to person, place, and time.  Psychiatric:        Mood and Affect: Mood normal.        Behavior: Behavior normal.     Imaging: IR Radiologist Eval & Mgmt  Result Date: 10/07/2022 EXAM: NEW PATIENT OFFICE VISIT CHIEF COMPLAINT: Low-back pain secondary to compression fracture at T12. Current Pain Level: 1-10 HISTORY OF PRESENT ILLNESS: History obtained from the patient, and also from the patient's medical records. The patient is an 82 year old right handed lady who has been referred for evaluation and management of a compression  fracture of the T12 vertebral body seen on a recent MRI scan of the thoracic spine. Patient reports history of a fall down nine steps almost just over a year ago which left her with a fractured clavicle, and also with severe low back pain. The pain in the lower thoracic region has been there almost constantly since that time. She reports the pain to be like annoying achy pain with radiation circumferentially prolonged standing, bending or straining. She describes her pain  to be an almost a 5/10 despite taking tramadol, Tylenol, and lidocaine patches. Without the medications, the patient reports the pain intensity to be a 9/10. She reports no radicular radiation of the pain into the lower extremities, or into her upper thoracic spine. She denies any autonomic dysfunction of her bowel or bladder. She reports that because of the pain she has not been able to perform her daily routine activities. The constant pain has limited her ability to perform activities such as cooking. She denies recent chills, fever or rigors. She takes two Tylenols which allows her to sleep through the night without waking up. Her appetite is normal.  Her weight is steady. She denies any nausea or vomiting, or constipation, diarrhea or melena. She does have a history of reflux related to her hiatal hernia which is controlled medically. Denies any recent chest pain, shortness of breath, or of palpitations. 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 Denies any hemoptysis, or constant coughing with sputum production. Denies recent dysuria, hematuria, or polyuria. Allergen * : Reactions . * : Clonidine * : Hives Immunization History Administered * : Date(s) Administered . * : Fluad Quad(high Dose 65+) * : 07/05/2019, 08/22/2020, 07/13/2022 . * : Influenza Split * : 09/23/2011, 09/09/2013 . * : Influenza Whole * : 10/04/2007, 08/07/2008, 09/16/2009 . *  : Influenza, High Dose Seasonal PF * : 10/04/2015, 08/05/2016, 09/14/2017, 09/08/2018 . * : Influenza,inj,Quad PF,6+ Mos * : 08/07/2014 . * : Influenza,inj,quad, With Preservative * : 02/05/2021 . * : Influenza-Unspecified * : 08/05/2021 . * Gala Murdoch Sars-Covid-2 Vaccination * : 12/17/2019 . * : PFIZER Comirnaty(Gray Top)Covid-19 Tri-Sucrose Vaccine * : 02/05/2021, 07/13/2022 . * : PFIZER(Purple Top)SARS-COV-2 Vaccination * : 11/07/2019, 11/28/2019, 07/18/2020 . * : PNEUMOCOCCAL CONJUGATE-20 * : 02/05/2021, 07/13/2022 . * Catering manager 33yrs & up * : 08/05/2021 . * : Pneumococcal Conjugate-13 * : 10/09/2013 . * : Pneumococcal Polysaccharide-23 * : 04/03/2016 . * : Tetanus * : 10/09/2013 Past Medical History: Diagnosis * : Date . * : Allergic rhinitis, seasonal * : . * : Arthritis * : . * : Asthma * : * : bronchial with weather change . * : Diabetes mellitus without complication ([facility]) * : * : type 2 . * : Dysrhythmia * : * : "Skipping a beat" . * : GERD (gastroesophageal reflux disease) * : . * : History of colon polyps * : . * : Hypertension * : . * : Pneumonia * : . * : Rotator cuff tear, right * : Tobacco History: Tobacco Use Smoking Status * : Former . * : Years: * : 10.00 . * : Types: * : Cigarettes . * : Quit date: * : 10/19/1988 . * : Years since quitting: * : 33.9 Smokeless Tobacco * : Never Counseling given: Not Answered Outpatient Medications Prior to Visit Medication * : Sig * : Dispense * : Refill . * : albuterol (VENTOLIN HFA) 108 (90 Base) MCG/ACT inhaler * : INHALE 2 PUFFS BY MOUTH EVERY 6 HOURS AS NEEDED FOR WHEEZING * : 6.7 each * : 1 . * : amLODipine (NORVASC) 10 MG tablet * : TAKE 1 TABLET BY MOUTH EVERY DAY * : 90 tablet * : 1 . * : benazepril (LOTENSIN) 40 MG tablet * : TAKE 1 TABLET BY MOUTH EVERY DAY * : 90 tablet * :  1 . * : ferrous sulfate 325 (65 FE) MG tablet * : TAKE 1 TABLET BY MOUTH 2 TIMES DAILY WITH A MEAL. * : 100 tablet * : 2 . * : hydrochlorothiazide  (HYDRODIURIL) 12.5 MG tablet * : TAKE 1 TABLET (12.5 MG TOTAL) BY MOUTH AS NEEDED. FOR SWELLING * : 90 tablet * : 3 . * : MELOXICAM PO * : Take by mouth. * : * : . * : metFORMIN (GLUCOPHAGE) 500 MG tablet * : TAKE 1 TABLET BY MOUTH TWICE A DAY WITH FOOD * : 180 tablet * : 1 . * : metoprolol succinate (TOPROL-XL) 50 MG 24 hr tablet * : TAKE 1 TABLET (50 MG TOTAL) BY MOUTH DAILY. PATIENT NEED A APPOINTMENT FOR FUTURE REFILLS 2ND ATTEMPT * : 90 tablet * : 1 . * : omeprazole (PRILOSEC) 20 MG capsule * : Take 1 capsule (20 mg total) by mouth 2 (two) times daily before a meal. * : 180 capsule * : 3 . * : SYMBICORT 160-4.5 MCG/ACT inhaler * : INHALE 2 PUFFS INTO THE LUNGS TWICE A DAY * : 10.2 each * : 3 . * : traMADol (ULTRAM) 50 MG tablet * : Take by mouth every 6 (six) hours as REVIEW OF SYSTEMS: Negative unless as mentioned above. PHYSICAL EXAMINATION: Alert, awake oriented to time, place, space. Speech and comprehension normal. Normal eye contact. Appears in no acute distress. However, she does have difficulty standing from a sitting position due to the back pain. Palpation of her back reveals significant point tenderness at the T12 level with radiation circumferentially more to the right of the midline. Station and gait:  Slow pace though without loss of balance. ASSESSMENT AND PLAN: Given patient's clinical history, clinical evaluation, and MRI findings, it was felt the patient would benefit from vertebral body augmentation at T12 with kyphoplasty. Pre procedure the reasons, and the alternatives were reviewed with the patient in detail. Patient expressed understanding of the procedure. The reason for the procedure is to provide alleviation of the pain with the intention to lower analgesia requirements, and to prevent further collapse at T12. Patient expressed her desire to proceed with the vertebrobasilar augmentation which will be scheduled as soon as possible. Patient advised to call should she have any concerns or  questions. Electronically Signed   By: Julieanne Cotton M.D.   On: 10/07/2022 12:36    Labs:  CBC: Recent Labs    04/08/22 1521 07/16/22 0938 10/06/22 1206 10/30/22 0711  WBC 7.7 6.1 7.9 7.6  HGB 9.3* 12.9 13.8 12.6  HCT 30.0* 38.8 40.7 38.5  PLT 507.0* 282.0 391.0 327    COAGS: No results for input(s): "INR", "APTT" in the last 8760 hours.  BMP: Recent Labs    07/16/22 0938  NA 138  K 3.8  CL 102  CO2 27  GLUCOSE 99  BUN 22  CALCIUM 10.0  CREATININE 0.88    LIVER FUNCTION TESTS: Recent Labs    07/16/22 0938  BILITOT 0.3  AST 15  ALT 19  ALKPHOS 77  PROT 7.7  ALBUMIN 4.4   Assessment and Plan:  T12 traumatic vertebral fracture --affecting ability to perform daily tasks --presents for planned vertebroplasty/kyphoplasty with expected discharge later today --is appropriately NPO and does not take blood thinners --labs, vitals, history and medications reviewed  Risks and benefits of kyphoplasty were discussed with the patient including, but not limited to education regarding the natural healing process of compression fractures without intervention, bleeding, infection,  cement migration which may cause spinal cord damage, paralysis, pulmonary embolism or even death.  This interventional procedure involves the use of X-rays and because of the nature of the planned procedure, it is possible that we will have prolonged use of X-ray fluoroscopy.  Potential radiation risks to you include (but are not limited to) the following: - A slightly elevated risk for cancer  several years later in life. This risk is typically less than 0.5% percent. This risk is low in comparison to the normal incidence of human cancer, which is 33% for women and 50% for men according to the [employer]. - Radiation induced injury can include skin redness, resembling a rash, tissue breakdown / ulcers and hair loss (which can be temporary or permanent).   The likelihood of  either of these occurring depends on the difficulty of the procedure and whether you are sensitive to radiation due to previous procedures, disease, or genetic conditions.   IF your procedure requires a prolonged use of radiation, you will be notified and given written instructions for further action.  It is your responsibility to monitor the irradiated area for the 2 weeks following the procedure and to notify your physician if you are concerned that you have suffered a radiation induced injury.    All of the patient's questions were answered, patient is agreeable to proceed.  Consent signed and in chart.   Thank you for this interesting consult.  I greatly enjoyed meeting Kristy [name] and look forward to participating in their care.  A copy of this report was sent to the requesting provider on this date.  Electronically Signed: Sheliah Plane, PA 10/30/2022, 7:41 AM   I spent a total of 20 Minutes in face to face in clinical consultation, greater than 50% of which was counseling/coordinating care for vertebroplasty

## 2022-11-05 DIAGNOSIS — M546 Pain in thoracic spine: Secondary | ICD-10-CM | POA: Diagnosis not present

## 2022-11-05 DIAGNOSIS — M5136 Other intervertebral disc degeneration, lumbar region: Secondary | ICD-10-CM | POA: Diagnosis not present

## 2022-11-05 DIAGNOSIS — M4854XD Collapsed vertebra, not elsewhere classified, thoracic region, subsequent encounter for fracture with routine healing: Secondary | ICD-10-CM | POA: Diagnosis not present

## 2022-11-05 DIAGNOSIS — M545 Low back pain, unspecified: Secondary | ICD-10-CM | POA: Diagnosis not present

## 2022-11-18 ENCOUNTER — Encounter: Payer: Self-pay | Admitting: Cardiovascular Disease

## 2022-11-18 ENCOUNTER — Ambulatory Visit: Payer: Medicare Other | Attending: Cardiovascular Disease | Admitting: Cardiovascular Disease

## 2022-11-18 VITALS — BP 129/75 | HR 65 | Ht 61.0 in | Wt 158.4 lb

## 2022-11-18 DIAGNOSIS — Z9889 Other specified postprocedural states: Secondary | ICD-10-CM | POA: Insufficient documentation

## 2022-11-18 DIAGNOSIS — I340 Nonrheumatic mitral (valve) insufficiency: Secondary | ICD-10-CM | POA: Diagnosis not present

## 2022-11-18 DIAGNOSIS — I10 Essential (primary) hypertension: Secondary | ICD-10-CM | POA: Diagnosis not present

## 2022-11-18 DIAGNOSIS — I351 Nonrheumatic aortic (valve) insufficiency: Secondary | ICD-10-CM | POA: Diagnosis not present

## 2022-11-18 DIAGNOSIS — E119 Type 2 diabetes mellitus without complications: Secondary | ICD-10-CM

## 2022-11-18 DIAGNOSIS — I5189 Other ill-defined heart diseases: Secondary | ICD-10-CM | POA: Insufficient documentation

## 2022-11-18 MED ORDER — HYDROCHLOROTHIAZIDE 12.5 MG PO TABS: 12.5000 mg | ORAL_TABLET | Freq: Every day | ORAL | 3 refills | Status: DC

## 2022-11-18 NOTE — Progress Notes (Unsigned)
Cardiology Office Note    Date:  11/19/2022   ID:  Kristy [name], DOB [birth year], MRN [id number]  PCP:  Isaac Bliss, Rayford Halsted, MD  Cardiologist:  Shelva Majestic, MD   12 F/U cardiology evaluation initially referred through the courtesy of Dr. Isaac Bliss   History of Present Illness:  Kristy [name] is a 82 y.o. female who is followed by Dr. Isaac Bliss for primary care.  She had previously undergone right knee arthroscopy and apparently is scheduled to undergo right total knee replacement electively on June 7.  She has had significant difficulty with walking and has been using a cane.  Previously in the past she had enjoyed Zumba as well as bowling.  She was seen yesterday by Dr. Isaac Bliss for preoperative clearance.  During her evaluation she was noticed to have frequent skipping beats and palpitations.  An ECG showed sinus rhythm with frequent PACs with a ventricular rate around 85 without acute ST-T changes.  She has a history of essential hypertension.  She was worked into my schedule today to be seen for preoperative cardiology clearance prior to her elective surgery.  A 2D echo Doppler study had been scheduled.  I saw the patient for initial cardiology evaluation on Mar 07, 2020.  At that time, she denied any chest pain or chest tightness and denied any significant shortness of breath.  She has a history of hypertension for greater than 15 years and was on a regimen consisting of amlodipine 10 mg, benazepril 40 mg and was on hydrochlorothiazide 25 mg daily.   Laboratory drawn the day before her evaluation with me had shown a hemoglobin A1c at 6.6 and Dr. Isaac Bliss had called in a prescription for metformin to initiate 500 mg twice a day effective today.  In addition she was found to be hypokalemic with a potassium of 3.1 and supplemental potassium 40 mEq daily for 3 days was ordered.  She was unaware of any  heart rate irregularity.  I reviewed the EKG from Dr.  Cresenciano Lick evaluation which showed every fourth beat being a PAC.  Ventricular rate was 85 bpm.  She denies PND orthopnea.  She denies presyncope or syncope.  She is unaware of any significant cardiac murmur.  During my evaluation, her blood pressure was slightly elevated at 144/70 and her ECG showed atrial trigeminy.  At that time I recommended she decrease HCTZ to 12.5 mg and started her on metoprolol succinate 25 mg daily with plan titration to 50 mg.  She underwent an echo Doppler study on Mar 12, 2020.  This revealed reduced LV function with an EF of 40 to 45% in a global hypokinetic pattern.  Left-ventricular internal cavity size is mildly dilated.  There is grade 1 diastolic dysfunction.  She had mildly elevated pulmonic artery systolic pressure.  There was mild left atrial dilatation.  There is evidence for a tricuspid aortic valve with mild aortic insufficiency.  There is mild mitral regurgitation.  She underwent follow-up laboratory which revealed improvement in her hypokalemia with potassium at 3.9.  With her reduced LV function, a Lexiscan Myoview study was recommended which was done on March 26, 2020 and was low risk.  She was seen on April 17, 2020 by Kristy Sims, DNP and at that time, her blood pressure was improved.  At that time, her knee discomfort was improved and she wished to defer surgery.  I  saw her on October 03, 2020.  Over the prior 6  months she was on  amlodipine 10 mg, benazepril 40 mg, HCTZ 12.5 mg in addition to metoprolol succinate 50 mg daily.  She has experienced ankle swelling.  She has had some recurrent knee discomfort and is now planning to undergo knee surgery with Dr. Sandie Ano.  During that evaluation, she was felt to be cardiovascularly stable patient was given clearance to undergo her knee replacement surgery.  I saw her on November 13, 2021.  At that time she felt well but had recently been hospitalized around Thanksgiving 2022 with left submandibular  inflammation/cellulitis.  She was seen by ENT.  She required antibiotics as well as Decadron taper.  She was discharged and then readmitted.  She is completed her course of therapy with resolution of symptomatology.  Presently she admits to being more short of breath with walking.  She denies any exertional chest tightness.  She admits to being fatigued.  She sleeps by herself she is unaware of snoring.  She does have frequent awakenings.  She continues to be on amlodipine 10 mg, benazepril 40 mg, HCTZ 12.5 mg, and metoprolol succinate 50 mg daily.  She is on metformin 500 mg for diabetes and is on as needed albuterol and Wixela in hub inhaler.  I recommended that she undergo a follow-up echo Doppler study for reassessment of LV function.  I also discussed the possibility of obstructive sleep apnea particularly with her frequent awakenings, nocturia and fatigability.  Since I saw her, she underwent an echo Doppler study on November 19, 2021.  Echo showed normal LV function with EF 55 to [id number], grade 1 diastolic dysfunction and mild MR and mild AR.  Presently, she feels well she has has issues with her back and tells me she recently underwent a kyphoplasty procedure due to a compression fracture of her thoracic vertebrae.  She denies any chest pain or shortness of breath.  She continues to be on amlodipine 10 mg, benazepril 40 mg, HCTZ 12.5 mg daily in addition to metoprolol 50 mg for blood pressure control.  She is on Symbicort 160/4.5 and takes as needed albuterol.  She is on metformin for diabetes mellitus.  She presents for evaluation.   Past Medical History:  Diagnosis Date   Allergic rhinitis, seasonal    Arthritis    Asthma    bronchial with weather change   Diabetes mellitus without complication ([place])    type 2    Dysrhythmia    "Skipping a beat"   GERD (gastroesophageal reflux disease)    History of colon polyps    Hypertension    Pneumonia    Rotator cuff tear, right     Past Surgical  History:  Procedure Laterality Date   BACK SURGERY     x3  ruptured disc    CHOLECYSTECTOMY OPEN  AGE 40   EYE SURGERY     bil cataracts   HERNIA REPAIR     INGUINAL HERNIA REPAIR Right AGE 89s   IR KYPHO THORACIC WITH BONE BIOPSY  10/30/2022   IR RADIOLOGIST EVAL & MGMT  10/07/2022   KNEE ARTHROSCOPY Left 2014   LUMBAR [place] SURGERY  x2  LAST DATE EARLY 2000s   ORIF ANKLE FRACTURE Right 06/24/2018   Procedure: OPEN REDUCTION INTERNAL FIXATION (ORIF) RIGHT ANKLE FRACTURE;  Surgeon: Mcarthur Rossetti, MD;  Location: [facility];  Service: Orthopedics;  Laterality: Right;   ROTATOR CUFF REPAIR Left 01/2015   TONSILLECTOMY  AGE 53   TOTAL KNEE ARTHROPLASTY Right 12/09/2020   Procedure:  TOTAL KNEE ARTHROPLASTY;  Surgeon: Gaynelle Arabian, MD;  Location: [facility];  Service: Orthopedics;  Laterality: Right;  13min   VAGINAL HYSTERECTOMY  AGE 79   WITH BSO    Current Medications: Outpatient Medications Prior to Visit  Medication Sig Dispense Refill   albuterol (VENTOLIN HFA) 108 (90 Base) MCG/ACT inhaler INHALE 2 PUFFS BY MOUTH EVERY 6 HOURS AS NEEDED FOR WHEEZING 6.7 each 1   amLODipine (NORVASC) 10 MG tablet TAKE 1 TABLET BY MOUTH EVERY DAY 90 tablet 1   benazepril (LOTENSIN) 40 MG tablet TAKE 1 TABLET BY MOUTH EVERY DAY 90 tablet 1   ferrous sulfate 325 (65 FE) MG tablet TAKE 1 TABLET BY MOUTH 2 TIMES DAILY WITH A MEAL. 100 tablet 2   MELOXICAM PO Take by mouth.     metFORMIN (GLUCOPHAGE) 500 MG tablet TAKE 1 TABLET BY MOUTH TWICE A DAY WITH FOOD 180 tablet 1   metoprolol succinate (TOPROL-XL) 50 MG 24 hr tablet TAKE 1 TABLET (50 MG TOTAL) BY MOUTH DAILY. PATIENT NEED A APPOINTMENT FOR FUTURE REFILLS 2ND ATTEMPT 90 tablet 1   omeprazole (PRILOSEC) 20 MG capsule Take 1 capsule (20 mg total) by mouth 2 (two) times daily before a meal. 180 capsule 3   Spacer/Aero-Holding Chambers DEVI Use with Symbicort inhaler 1 each 2   SYMBICORT 160-4.5 MCG/ACT inhaler INHALE 2 PUFFS INTO THE LUNGS TWICE A DAY  10.2 each 3   traMADol (ULTRAM) 50 MG tablet Take by mouth every 6 (six) hours as needed.     hydrochlorothiazide (HYDRODIURIL) 12.5 MG tablet TAKE 1 TABLET (12.5 MG TOTAL) BY MOUTH AS NEEDED. FOR SWELLING 90 tablet 3   No facility-administered medications prior to visit.     Allergies:   Clonidine   Social History   Socioeconomic History   Marital status: Widowed    Spouse name: Not on file   Number of children: 4   Years of education: Not on file   Highest education level: Not on file  Occupational History   Occupation: retired  Tobacco Use   Smoking status: Former    Years: 10.00    Types: Cigarettes    Quit date: 10/19/1988    Years since quitting: 34.1   Smokeless tobacco: Never  Vaping Use   Vaping Use: Never used  Substance and Sexual Activity   Alcohol use: Yes    Alcohol/week: 3.0 - 4.0 standard drinks of alcohol    Types: 3 - 4 Glasses of wine per week    Comment: 2-3 times a week   Drug use: Never   Sexual activity: Not Currently  Other Topics Concern   Not on file  Social History Narrative   Not on file   Social Determinants of Health   Financial Resource Strain: Low Risk  (05/15/2021)   Overall Financial Resource Strain (CARDIA)    Difficulty of Paying Living Expenses: Not hard at all  Food Insecurity: No Food Insecurity (05/15/2021)   Hunger Vital Sign    Worried About Running Out of Food in the Last Year: Never true    Ran Out of Food in the Last Year: Never true  Transportation Needs: No Transportation Needs (05/15/2021)   PRAPARE - Hydrologist (Medical): No    Lack of Transportation (Non-Medical): No  Physical Activity: Not on file  Stress: No Stress Concern Present (05/15/2021)   [place]    Feeling of Stress : Not at  all  Social Connections: Not on file    Additional social history: She is widowed for 1 year.  She has 4 children, 12 grandchildren and  10 great-grandchildren.  She is retired.  Previously she was a Merchandiser, retail for [employer].  She has associated college degree.  Remotely she had smoked for 5 years.  She quit in 2001.  She does drink occasional wine.  Used to be very active but this has been limited due to her progressive knee pain  Family History: Family history is notable that her mother died at age 44 with a stroke.  Father died at age 4.  Maternal grandmother died at 52 with natural causes.  She has 4 brothers and 3 sisters.  ROS General: Negative; No fevers, chills, or night sweats;  HEENT: Negative; No changes in vision or hearing, sinus congestion, difficulty swallowing Pulmonary: History of allergy induced bronchial asthma Cardiovascular: See HPI GI: Negative; No nausea, vomiting, diarrhea, or abdominal pain GU: Negative; No dysuria, hematuria, or difficulty voiding Musculoskeletal: Positive for right knee discomfort Hematologic/Oncology: Negative; no easy bruising, bleeding Endocrine: Positive for diabetes Neuro: Negative; no changes in balance, headaches Skin: Negative; No rashes or skin lesions Psychiatric: Negative; No behavioral problems, depression Sleep: Nonrestorative sleep; unaware of snoring, nocturia 3 times per night, no bruxism, restless legs, hypnogognic hallucinations, no cataplexy Other comprehensive 14 point system review is negative.   PHYSICAL EXAM:   VS:  BP 129/75   Pulse 65   Ht 5\' 1"  (1.549 m)   Wt 158 lb 6.4 oz (71.8 kg)   SpO2 97%   BMI 29.93 kg/m     Repeat blood pressure by me was 126/70  Wt Readings from Last 3 Encounters:  11/18/22 158 lb 6.4 oz (71.8 kg)  10/30/22 160 lb (72.6 kg)  10/06/22 154 lb (69.9 kg)   General: Alert, oriented, no distress.  Skin: normal turgor, no rashes, warm and dry HEENT: Normocephalic, atraumatic. Pupils equal round and reactive to light; sclera anicteric; extraocular muscles intact;  Nose without nasal septal hypertrophy Mouth/Parynx benign;  Mallinpatti scale 3 Neck: No JVD, no carotid bruits; normal carotid upstroke Lungs: clear to ausculatation and percussion; no wheezing or rales Chest wall: without tenderness to palpitation Heart: PMI not displaced, RRR, s1 s2 normal, 1/6 systolic murmur, no diastolic murmur, no rubs, gallops, thrills, or heaves Abdomen: soft, nontender; no hepatosplenomehaly, BS+; abdominal aorta nontender and not dilated by palpation. Back: no CVA tenderness Pulses 2+ Musculoskeletal: full range of motion, normal strength, no joint deformities Extremities: no clubbing cyanosis or edema, Homan's sign negative  Neurologic: grossly nonfocal; Cranial nerves grossly wnl Psychologic: Normal mood and affect    Studies/Labs Reviewed:   November 18, 2022 ECG (independently read by me): NSR at 65, nonspecific T wave abnormality  November 13, 2021 ECG (independently read by me):  NSR at 77, nonspecific T wave  October 08, 2020 ECG (independently read by me): NSR at 69; baseline wander; normal intervals  May 2021 ECG (independently read by me): Sinus rhythm at 78 bpm with PACs and atrial trigeminal rhythm.  QTc interval 3 7 3  ms.  Parable 146 ms  Mar 07, 2020 ECG (independently read by me): Sinus rhythm with PACs and atrial trigeminal rhythm pattern.  QTc interval 446 ms  Recent Labs:    Latest Ref Rng & Units 10/30/2022    7:11 AM 07/16/2022    9:38 AM 09/12/2021    3:01 PM  BMP  Glucose 70 - 99 mg/dL 226  99  129   BUN 8 - 23 mg/dL 11  22  16    Creatinine 0.44 - 1.00 mg/dL 0.73  0.88  0.89   Sodium 135 - 145 mmol/L 137  138  138   Potassium 3.5 - 5.1 mmol/L 3.5  3.8  3.5   Chloride 98 - 111 mmol/L 98  102  103   CO2 22 - 32 mmol/L 30  27  27    Calcium 8.9 - 10.3 mg/dL 9.6  10.0  8.9         Latest Ref Rng & Units 07/16/2022    9:38 AM 11/27/2020   11:13 AM 03/15/2020   11:41 AM  Hepatic Function  Total Protein 6.0 - 8.3 g/dL 7.7  8.0  7.6   Albumin 3.5 - 5.2 g/dL 4.4  4.6  4.2   AST 0 - 37 U/L  15  16  19    ALT 0 - 35 U/L 19  12  16    Alk Phosphatase 39 - 117 U/L 77  53  54   Total Bilirubin 0.2 - 1.2 mg/dL 0.3  0.6  0.5        Latest Ref Rng & Units 10/30/2022    7:11 AM 10/06/2022   12:06 PM 07/16/2022    9:38 AM  CBC  WBC 4.0 - 10.5 K/uL 7.6  7.9  6.1   Hemoglobin 12.0 - 15.0 g/dL 12.6  13.8  12.9   Hematocrit 36.0 - 46.0 % 38.5  40.7  38.8   Platelets 150 - 400 K/uL 327  391.0  282.0    Lab Results  Component Value Date   MCV 93.4 10/30/2022   MCV 91.3 10/06/2022   MCV 85.6 07/16/2022   Lab Results  Component Value Date   TSH 2.37 03/06/2020   Lab Results  Component Value Date   HGBA1C 6.3 07/16/2022     BNP No results found for: "BNP"  ProBNP No results found for: "PROBNP"   Lipid Panel     Component Value Date/Time   CHOL 197 07/16/2022 0938   TRIG 90.0 07/16/2022 0938   TRIG 82 09/30/2006 0943   HDL 73.10 07/16/2022 0938   CHOLHDL 3 07/16/2022 0938   VLDL 18.0 07/16/2022 0938   LDLCALC 106 (H) 07/16/2022 0938   LDLDIRECT 118.8 03/30/2013 1145     RADIOLOGY: IR KYPHO THORACIC WITH BONE BIOPSY  Result Date: 10/31/2022 INDICATION: Thoracolumbar pain secondary to compression fracture at T12. EXAM: BALLOON KYPHOPLASTY AT T12 COMPARISON:  None Available. MEDICATIONS: As antibiotic prophylaxis, Ancef 2 g IV was ordered pre-procedure and administered intravenously within 1 hour of incision. All current medications are in the EMR and have been reviewed as part of this encounter. ANESTHESIA/SEDATION: Moderate (conscious) sedation was employed during this procedure. A total of Versed 5 mg and Fentanyl 100 mcg was administered intravenously by the radiology nurse. Total intra-service moderate Sedation Time: 37 minutes. The patient's level of consciousness and vital signs were monitored continuously by radiology nursing throughout the procedure under my direct supervision. FLUOROSCOPY: Radiation Exposure Index (as provided by the fluoroscopic device): 267  mGy Kerma COMPLICATIONS: None immediate. PROCEDURE: Following a full explanation of the procedure along with the potential associated complications, an informed witnessed consent was obtained. The patient was placed prone on the fluoroscopic table. The skin overlying the thoracolumbar region was then prepped and draped in the usual sterile fashion. Both pedicles at T12 were then infiltrated with 0.25% bupivacaine followed by the advancement of an 11-gauge  Jamshidi needle through each pedicle into the posterior one-third at T12. These were then exchanged for a Kyphon advanced osteo introducer system comprised of a working cannula and a Kyphon osteo drill. The combinations were then advanced over a Kyphon osteo bone pin until the tip of the Kyphon osteo drill was in the posterior third at T12. At this time, the bone pins were removed. In a medial trajectory, the combinations were advanced until the tips of the working cannulae were inside the posterior one-third at T12. The osteo drills were removed. Through the working cannulae, a Kyphon inflatable bone tamp 20 x 3 was advanced and positioned with the distal marker 5 mm from the anterior aspect of T12. Crossing of the midline was seen on the AP projection. At this time, the balloon was expanded using contrast via a Kyphon inflation syringe device via microtubing. Inflations were continued until there was apposition with the inferior endplates. At this time, methylmethacrylate mixture was reconstituted with Tobramycin in the Kyphon bone mixing device system. This was then loaded onto the Kyphon bone fillers. The balloons were deflated and removed followed by the instillation of 3-1/2 bone filler equivalents of methylmethacrylate mixture at T12 with excellent filling in the AP and lateral projections. No extravasation was noted in the disk spaces or posteriorly into the spinal canal. No epidural venous contamination was seen. The working cannulae and the bone fillers  were then retrieved and removed. Hemostasis was achieved at the skin entry sites. IMPRESSION: 1. Status post vertebral body augmentation using balloon kyphoplasty at T12 as described without event. If the patient has known osteoporosis, recommend treatment as clinically indicated. If the patient's bone density status is unknown, DEXA scan is recommended. Electronically Signed   By: Luanne Bras M.D.   On: 10/31/2022 08:58     Additional studies/ records that were reviewed today include:  I reviewed the patient's records from Dr. Isaac Bliss.  ECHO 03/12/2020 IMPRESSIONS   1. Mild to moderate LV dysfunction; grade 1 diastolic dysfunction; mild  LVE; mild AI and MR; mild LAE.   2. Left ventricular ejection fraction, by estimation, is 40 to 45%. The  left ventricle has mildly decreased function. The left ventricle  demonstrates global hypokinesis. The left ventricular internal cavity size  was mildly dilated. Left ventricular  diastolic parameters are consistent with Grade I diastolic dysfunction  (impaired relaxation).   3. Right ventricular systolic function is normal. The right ventricular  size is normal. There is mildly elevated pulmonary artery systolic  pressure.   4. Left atrial size was mildly dilated.   5. The mitral valve is normal in structure. Mild mitral valve  regurgitation. No evidence of mitral stenosis.   6. The aortic valve is tricuspid. Aortic valve regurgitation is mild. No  aortic stenosis is present.   7. The inferior vena cava is normal in size with greater than 50%  respiratory variability, suggesting right atrial pressure of 3 mmHg.    ASSESSMENT:    1. Essential hypertension   2. Grade I diastolic dysfunction   3. Mild mitral regurgitation   4. Mild aortic insufficiency   5. Type 2 diabetes mellitus without complication, without long-term current use of insulin (HCC)   6. Status post kyphoplasty      PLAN:  Kristy [name] is a very pleasant  82 year old African-American female with a history of essential hypertension for greater than 15 years and most recently has been on amlodipine 10 mg daily, benazepril 40 mg and HCTZ  25 mg.  I saw her for initial evaluation in May 2021 after she had been seen by Dr. Isaac Bliss who noted irregularity to her heart rate.  The patient was planning to undergo knee surgery.   When I saw her for initial preoperative evaluation on Mar 07, 2020, her ECG showed sinus rhythm with atrial trigeminy.  Potassium drawn the day prior to her initial evaluation was low at 3.1 and hemoglobin A1c was increased and she was started on metformin and given potassium replacement.  During that initial evaluation I recommended initiation of metoprolol succinate 25 mg daily and reduction of her HCTZ to 12.5 mg daily.  Subsequent lab work showed normalization of potassium. Subsequent laboratory has shown normalization of potassium.  An echo Doppler study May 2021 demonstrated reduced LV function EF 40 -45% with mild LV dilation, aortic insufficiency as well as mitral regurgitation.  With her reduction in LV function, I recommended further evaluation with a Lexiscan Myoview study which was done on March 26, 2020.  This did not reveal any ST segment deviation and she had normal perfusion.  The study was interpreted as low risk.  He was given clearance to undergo her knee surgery which was done by Dr. Reynaldo Minium.  In November 2022 she  developed a left submandibular cellulitis/infarct summation and required Rocephin, Augmentin and Decadron therapy and was  evaluated by ENT.  At her January 2023 evaluation with me she had admitted to experiencing some increased shortness of breath with walking to her car or climbing steps.  A subsequent echo Doppler study on November 19, 2021 continue to show normal LV function which was now 55 to 60%.  She had grade 1 diastolic dysfunction with mild MR and mild AR. Presently, she is doing well on current therapy.   She developed a compression fracture at T12 and recently underwent kyphoplasty in mid January 2024.  She tolerated this well from a cardiovascular standpoint.  Her blood pressure today is stable.  Her ECG remained stable.  She will be following up with her primary physician Dr. Thersa Salt and I will see her in 1 year for follow-up evaluation or sooner as needed    Medication Adjustments/Labs and Tests Ordered: Current medicines are reviewed at length with the patient today.  Concerns regarding medicines are outlined above.  Medication changes, Labs and Tests ordered today are listed in the Patient Instructions below. Patient Instructions  Medication Instructions:  Your physician recommends that you continue on your current medications as directed. Please refer to the Current Medication list given to you today.  *If you need a refill on your cardiac medications before your next appointment, please call your pharmacy*  Follow-Up: At [facility], you and your health needs are our priority.  As part of our continuing mission to provide you with exceptional heart care, we have created designated Provider Care Teams.  These Care Teams include your primary Cardiologist (physician) and Advanced Practice Providers (APPs -  Physician Assistants and Nurse Practitioners) who all work together to provide you with the care you need, when you need it.  We recommend signing up for the patient portal called "MyChart".  Sign up information is provided on this After Visit Summary.  MyChart is used to connect with patients for Virtual Visits (Telemedicine).  Patients are able to view lab/test results, encounter notes, upcoming appointments, etc.  Non-urgent messages can be sent to your provider as well.   To learn more about what you can do  with MyChart, go to [email or web address].    Your next appointment:   12 month(s)  Provider:   Nicki Guadalajara, MD        Signed, Kristy Guadalajara, MD   11/19/2022 2:57 PM    [facility] [address], [place], [place]  [address] Phone: [phone]

## 2022-11-18 NOTE — Patient Instructions (Signed)
Medication Instructions:  Your physician recommends that you continue on your current medications as directed. Please refer to the Current Medication list given to you today.  *If you need a refill on your cardiac medications before your next appointment, please call your pharmacy*  Follow-Up: At [facility], you and your health needs are our priority.  As part of our continuing mission to provide you with exceptional heart care, we have created designated Provider Care Teams.  These Care Teams include your primary Cardiologist (physician) and Advanced Practice Providers (APPs -  Physician Assistants and Nurse Practitioners) who all work together to provide you with the care you need, when you need it.  We recommend signing up for the patient portal called "MyChart".  Sign up information is provided on this After Visit Summary.  MyChart is used to connect with patients for Virtual Visits (Telemedicine).  Patients are able to view lab/test results, encounter notes, upcoming appointments, etc.  Non-urgent messages can be sent to your provider as well.   To learn more about what you can do with MyChart, go to [email or web address].    Your next appointment:   12 month(s)  Provider:   Thomas Kelly, MD       

## 2022-11-19 ENCOUNTER — Encounter: Payer: Self-pay | Admitting: Cardiovascular Disease

## 2022-11-20 LAB — HM DEXA SCAN

## 2022-11-20 NOTE — Addendum Note (Signed)
Addended by: Wonda Horner on: 11/20/2022 10:08 AM   Modules accepted: Orders

## 2022-11-24 NOTE — Progress Notes (Signed)
Moderately severe restrictive defect. This can be related to numerous things but we need to rule out a pulmonary cause including interstitial lung disease. She has never had CT imaging of her chest. Please place order for HRCT chest, supine and prone, for ILD. She will need follow up with Dr. Shearon Stalls or myself afterwards. Thanks.

## 2022-11-25 ENCOUNTER — Other Ambulatory Visit: Payer: Self-pay | Admitting: *Deleted

## 2022-11-25 DIAGNOSIS — J849 Interstitial pulmonary disease, unspecified: Secondary | ICD-10-CM

## 2022-11-25 NOTE — Progress Notes (Signed)
Called and spoke with patient, advised of results/recommendations per Roxan Diesel NP.  She verbalized understanding and was agreeable to the HRCT scan.  Order placed.  She will need an OV scheduled after HRCT scan.

## 2022-12-10 ENCOUNTER — Ambulatory Visit (HOSPITAL_COMMUNITY)
Admission: RE | Admit: 2022-12-10 | Discharge: 2022-12-10 | Disposition: A | Discharge status: Home / Self Care | Payer: Medicare Other | Source: Ambulatory Visit | Attending: Nurse Practitioner | Admitting: Nurse Practitioner

## 2022-12-10 DIAGNOSIS — J849 Interstitial pulmonary disease, unspecified: Secondary | ICD-10-CM | POA: Insufficient documentation

## 2022-12-10 DIAGNOSIS — J479 Bronchiectasis, uncomplicated: Secondary | ICD-10-CM | POA: Diagnosis not present

## 2022-12-10 DIAGNOSIS — S22080A Wedge compression fracture of T11-T12 vertebra, initial encounter for closed fracture: Secondary | ICD-10-CM | POA: Diagnosis not present

## 2022-12-10 DIAGNOSIS — K449 Diaphragmatic hernia without obstruction or gangrene: Secondary | ICD-10-CM | POA: Diagnosis not present

## 2022-12-10 DIAGNOSIS — J9811 Atelectasis: Secondary | ICD-10-CM | POA: Diagnosis not present

## 2022-12-14 ENCOUNTER — Telehealth: Payer: Self-pay | Admitting: Internal Medicine

## 2022-12-14 NOTE — Telephone Encounter (Signed)
Called and spoke with patient. Patient stated that she was just calling to get an appointment scheduled. Patient has been scheduled an appointment.   Nothing further needed.

## 2022-12-14 NOTE — Telephone Encounter (Signed)
PT would like call from Triage nurse.

## 2022-12-15 ENCOUNTER — Ambulatory Visit: Payer: Medicare Other | Admitting: Nurse Practitioner

## 2022-12-16 ENCOUNTER — Encounter: Payer: Self-pay | Admitting: Adult Health

## 2022-12-16 ENCOUNTER — Telehealth: Payer: Self-pay | Admitting: *Deleted

## 2022-12-16 ENCOUNTER — Ambulatory Visit (INDEPENDENT_AMBULATORY_CARE_PROVIDER_SITE_OTHER): Payer: Medicare Other | Admitting: Adult Health

## 2022-12-16 VITALS — BP 136/70 | HR 73 | Temp 98.4°F | Ht 61.0 in | Wt 163.8 lb

## 2022-12-16 DIAGNOSIS — J4540 Moderate persistent asthma, uncomplicated: Secondary | ICD-10-CM

## 2022-12-16 DIAGNOSIS — K219 Gastro-esophageal reflux disease without esophagitis: Secondary | ICD-10-CM

## 2022-12-16 DIAGNOSIS — J849 Interstitial pulmonary disease, unspecified: Secondary | ICD-10-CM | POA: Diagnosis not present

## 2022-12-16 DIAGNOSIS — R051 Acute cough: Secondary | ICD-10-CM | POA: Diagnosis not present

## 2022-12-16 DIAGNOSIS — R0609 Other forms of dyspnea: Secondary | ICD-10-CM

## 2022-12-16 DIAGNOSIS — J4520 Mild intermittent asthma, uncomplicated: Secondary | ICD-10-CM | POA: Diagnosis not present

## 2022-12-16 DIAGNOSIS — J3089 Other allergic rhinitis: Secondary | ICD-10-CM

## 2022-12-16 MED ORDER — ALBUTEROL SULFATE HFA 108 (90 BASE) MCG/ACT IN AERS: INHALATION_SPRAY | RESPIRATORY_TRACT | 1 refills | Status: DC

## 2022-12-16 MED ORDER — BENZONATATE 200 MG PO CAPS: 200.0000 mg | ORAL_CAPSULE | Freq: Three times a day (TID) | ORAL | 1 refills | Status: DC | PRN

## 2022-12-16 MED ORDER — PREDNISONE 10 MG PO TABS: ORAL_TABLET | ORAL | 0 refills | Status: DC

## 2022-12-16 MED ORDER — AZITHROMYCIN 250 MG PO TABS: ORAL_TABLET | ORAL | 0 refills | Status: AC

## 2022-12-16 NOTE — Assessment & Plan Note (Signed)
Recurrent asthmatic bronchitic exacerbations questionable etiology.  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  Recent PFT showed some mild to moderate restriction with normal diffusing capacity.  Also patient has significant underlying reflux which seems to be controlled on PPI therapy.  Suspect ACE inhibitor is not helping her cough any.  Would recommend changing her ACE inhibitor if possible.  Would control for triggers such as postnasal drainage.  For now we will treat with empiric antibiotics and steroids.  Continue on Symbicort.  On return consider adding Spiriva or changing to Trelegy or Breztri.  Add in flutter valve therapy.  Also component of tracheobronchomalacia as well may be contributing to ongoing cough and wheezing along with shortness of breath.   Plan  Patient Instructions  Please reach out to your Primary MD regarding Lotensin as it may be causing your cough to be worse.  We will reach out to pharmacy team to check on formulary to replace Symbicort .   Zpack take as directed.  Prednisone taper over next week  Continue on Symbicort 2 puffs Twice daily   Albuterol inhaler 1-2 puffs every 6hr as needed  Change Claritin to Zyrtec '10mg'$  daily  Saline nasal rinses As needed   Delsym 2 tsp Twice daily  for cough As needed   Tessalon Three times a day  for cough As needed   Begin Flutter valve Twice daily   Continue on Prilosec daily  Will send note to Dr. Trudie Reed.   Follow up with Dr. Shearon Stalls back in 6 weeks and As needed   Please contact office for sooner follow up if symptoms do not improve or worsen or seek emergency care

## 2022-12-16 NOTE — Patient Instructions (Addendum)
Please reach out to your Primary MD regarding Lotensin as it may be causing your cough to be worse.  We will reach out to pharmacy team to check on formulary to replace Symbicort .   Zpack take as directed.  Prednisone taper over next week  Continue on Symbicort 2 puffs Twice daily   Albuterol inhaler 1-2 puffs every 6hr as needed  Change Claritin to Zyrtec '10mg'$  daily  Saline nasal rinses As needed   Delsym 2 tsp Twice daily  for cough As needed   Tessalon Three times a day  for cough As needed   Begin Flutter valve Twice daily   Continue on Prilosec daily  Will send note to Dr. Trudie Reed.   Follow up with Dr. Shearon Stalls back in 6 weeks and As needed   Please contact office for sooner follow up if symptoms do not improve or worsen or seek emergency care

## 2022-12-16 NOTE — Assessment & Plan Note (Signed)
Continue on current regimen 

## 2022-12-16 NOTE — Assessment & Plan Note (Signed)
Suspect is multifactorial.  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  Along with tracheobronchomalacia.  She has recurrent asthmatic headache exacerbations.   Plan  Patient Instructions  Please reach out to your Primary MD regarding Lotensin as it may be causing your cough to be worse.  We will reach out to pharmacy team to check on formulary to replace Symbicort .   Zpack take as directed.  Prednisone taper over next week  Continue on Symbicort 2 puffs Twice daily   Albuterol inhaler 1-2 puffs every 6hr as needed  Change Claritin to Zyrtec '10mg'$  daily  Saline nasal rinses As needed   Delsym 2 tsp Twice daily  for cough As needed   Tessalon Three times a day  for cough As needed   Begin Flutter valve Twice daily   Continue on Prilosec daily  Will send note to Dr. Trudie Reed.   Follow up with Dr. Shearon Stalls back in 6 weeks and As needed   Please contact office for sooner follow up if symptoms do not improve or worsen or seek emergency care

## 2022-12-16 NOTE — Telephone Encounter (Signed)
Patient states that insurance company told her that they were no longer covering her Symbicort 160, however, did not tell her what they would cover.  No message given when I attempted to order it.  Can you please see what inhaler her insurance will cover?  Thank you.

## 2022-12-16 NOTE — Progress Notes (Signed)
$'@Patient'V$  ID: Kristy [name], female    DOB: [birth year], 82 y.o.   MRN: [id number]  Chief Complaint  Patient presents with   Acute Visit    Referring provider: Leotis Shames*  HPI: 82 year old female minimum smoking history followed for asthma and allergic rhinitis Past medical history significant for hypertension, history of chronic DVT of right femoral vein, GERD, DM 2, LPR   TEST/EVENTS :  11/19/2021 echocardiogram: EF 55 to 60%.  G1 DD.  RV size and function is normal.  Normal PASP.  Mild MR.  Mild AR.  07/16/2022 eos 400  08/12/2022 RAST negative  12/16/2022 Acute OV : Asthma,Cough  Patient presents for an acute office visit.  She complains over the last few weeks that she has had increased cough, congestion with thick mucus intermittent wheezing and shortness of breath.  Patient says that over the last year that she has had a chronic cough that waxes and wanes.  She will intermittently get antibiotics and steroids.  Complains of getting short of breath with activities. She was set up for PFTs October 21, 2022 that showed mild to moderate restriction with FEV1 at 87%, ratio 84, FVC 76%, DLCO 92%.  She remains on Symbicort twice daily.  She was set up for a high-resolution CT chest that was done on December 10, 2020 that showed no enlarged adenopathy, large hiatal hernia, mild peripheral septal thickening and subpleural reticulation and mild bronchiectasis with no honeycombing.,  Partial collapse of the trachea and mainstem bronchi during expiration indicative of tracheobronchomalacia.  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  She is on PPI therapy.  Patient is currently followed by her otology.  She says she has been seeing rheumatology for years.  Is on Celebrex for osteoarthritis.  She says she believes she has been checked for rheumatoid arthritis in past which was negative.  She denies any  rash, dry eyes.   Allergies  Allergen Reactions   Clonidine Hives    Immunization History  Administered Date(s) Administered   Fluad Quad(high Dose 65+) 07/05/2019, 08/22/2020, 07/13/2022   Influenza Split 09/23/2011, 09/09/2013   Influenza Whole 10/04/2007, 08/07/2008, 09/16/2009   Influenza, High Dose Seasonal PF 10/04/2015, 08/05/2016, 09/14/2017, 09/08/2018   Influenza,inj,Quad PF,6+ Mos 08/07/2014   Influenza,inj,quad, With Preservative 02/05/2021   Influenza-Unspecified 08/05/2021   Moderna Sars-Covid-2 Vaccination 12/17/2019   PFIZER Comirnaty(Gray Top)Covid-19 Tri-Sucrose Vaccine 02/05/2021, 07/13/2022   PFIZER(Purple Top)SARS-COV-2 Vaccination 11/07/2019, 11/28/2019, 07/18/2020   PNEUMOCOCCAL CONJUGATE-20 02/05/2021, 07/13/2022   Pfizer Covid-19 Vaccine Bivalent Booster 29yr & up 08/05/2021   Pneumococcal Conjugate-13 10/09/2013   Pneumococcal Polysaccharide-23 04/03/2016   Tetanus 10/09/2013    Past Medical History:  Diagnosis Date   Allergic rhinitis, seasonal    Arthritis    Asthma    bronchial with weather change   Diabetes mellitus without complication ([place]    type 2    Dysrhythmia    "Skipping a beat"   GERD (gastroesophageal reflux disease)    History of colon polyps    Hypertension    Pneumonia    Rotator cuff tear, right     Tobacco History: Social History   Tobacco Use  Smoking Status Former   Years: 10.00   Types: Cigarettes   Quit date: 10/19/1988   Years since quitting: 34.1  Smokeless Tobacco Never   Counseling given: Not Answered   Outpatient Medications Prior to Visit  Medication Sig Dispense Refill  amLODipine (NORVASC) 10 MG tablet TAKE 1 TABLET BY MOUTH EVERY DAY 90 tablet 1   benazepril (LOTENSIN) 40 MG tablet TAKE 1 TABLET BY MOUTH EVERY DAY 90 tablet 1   celecoxib (CELEBREX) 200 MG capsule Take 200 mg by mouth daily.     ferrous sulfate 325 (65 FE) MG tablet TAKE 1 TABLET BY MOUTH 2 TIMES DAILY WITH A MEAL. 100 tablet 2    hydrochlorothiazide (HYDRODIURIL) 12.5 MG tablet Take 1 tablet (12.5 mg total) by mouth daily. 90 tablet 3   metFORMIN (GLUCOPHAGE) 500 MG tablet TAKE 1 TABLET BY MOUTH TWICE A DAY WITH FOOD 180 tablet 1   metoprolol succinate (TOPROL-XL) 50 MG 24 hr tablet TAKE 1 TABLET (50 MG TOTAL) BY MOUTH DAILY. PATIENT NEED A APPOINTMENT FOR FUTURE REFILLS 2ND ATTEMPT 90 tablet 1   omeprazole (PRILOSEC) 20 MG capsule Take 1 capsule (20 mg total) by mouth 2 (two) times daily before a meal. 180 capsule 3   Spacer/Aero-Holding Chambers DEVI Use with Symbicort inhaler 1 each 2   SYMBICORT 160-4.5 MCG/ACT inhaler INHALE 2 PUFFS INTO THE LUNGS TWICE A DAY 10.2 each 3   traMADol (ULTRAM) 50 MG tablet Take by mouth every 6 (six) hours as needed.     albuterol (VENTOLIN HFA) 108 (90 Base) MCG/ACT inhaler INHALE 2 PUFFS BY MOUTH EVERY 6 HOURS AS NEEDED FOR WHEEZING 6.7 each 1   MELOXICAM PO Take by mouth. (Patient not taking: Reported on 12/16/2022)     No facility-administered medications prior to visit.     Review of Systems:   Constitutional:   No  weight loss, night sweats,  Fevers, chills, + fatigue, or  lassitude.  HEENT:   No headaches,  Difficulty swallowing,  Tooth/dental problems, or  Sore throat,                No sneezing, itching, ear ache, nasal congestion, post nasal drip,   CV:  No chest pain,  Orthopnea, PND, swelling in lower extremities, anasarca, dizziness, palpitations, syncope.   GI  No heartburn, indigestion, abdominal pain, nausea, vomiting, diarrhea, change in bowel habits, loss of appetite, bloody stools.   Resp: .  No chest wall deformity  Skin: no rash or lesions.  GU: no dysuria, change in color of urine, no urgency or frequency.  No flank pain, no hematuria   MS:  No joint pain or swelling.  No decreased range of motion.  No back pain.    Physical Exam  BP 136/70 (BP Location: Left Arm, Patient Position: Sitting, Cuff Size: Normal)   Pulse 73   Temp 98.4 F (36.9 C)  (Oral)   Ht '5\' 1"'$  (1.549 m)   Wt 163 lb 12.8 oz (74.3 kg)   SpO2 98%   BMI 30.95 kg/m   GEN: A/Ox3; pleasant , NAD, well nourished    HEENT:  [place]/AT,  NOSE-clear, THROAT-clear, no lesions, no postnasal drip or exudate noted.   NECK:  Supple w/ fair ROM; no JVD; normal carotid impulses w/o bruits; no thyromegaly or nodules palpated; no lymphadenopathy.    RESP  Clear  P & A; w/o, wheezes/ rales/ or rhonchi. no accessory muscle use, no dullness to percussion  CARD:  RRR, no m/r/g, no peripheral edema, pulses intact, no cyanosis or clubbing.  GI:   Soft & nt; nml bowel sounds; no organomegaly or masses detected.   Musco: Warm bil, no deformities or joint swelling noted.   Neuro: alert, no focal deficits noted.  Skin: Warm, no lesions or rashes    Lab Results:  CBC    Component Value Date/Time   WBC 7.6 10/30/2022 0711   RBC 4.12 10/30/2022 0711   HGB 12.6 10/30/2022 0711   HCT 38.5 10/30/2022 0711   PLT 327 10/30/2022 0711   MCV 93.4 10/30/2022 0711   MCH 30.6 10/30/2022 0711   MCHC 32.7 10/30/2022 0711   RDW 13.0 10/30/2022 0711   LYMPHSABS 2.5 10/06/2022 1206   MONOABS 0.6 10/06/2022 1206   EOSABS 0.2 10/06/2022 1206   BASOSABS 0.1 10/06/2022 1206    BMET    Component Value Date/Time   NA 137 10/30/2022 0711   NA 139 03/13/2020 1458   K 3.5 10/30/2022 0711   CL 98 10/30/2022 0711   CO2 30 10/30/2022 0711   GLUCOSE 111 (H) 10/30/2022 0711   GLUCOSE 82 09/30/2006 0943   BUN 11 10/30/2022 0711   BUN 17 03/13/2020 1458   CREATININE 0.73 10/30/2022 0711   CALCIUM 9.6 10/30/2022 0711   GFRNONAA >60 10/30/2022 0711   GFRAA >60 03/15/2020 1141    BNP No results found for: "BNP"  ProBNP No results found for: "PROBNP"  Imaging: CT Chest High Resolution  Result Date: 12/10/2022 CLINICAL DATA:  82 year old female with clinical concern for potential interstitial lung disease. EXAM: CT CHEST WITHOUT CONTRAST TECHNIQUE: Multidetector CT imaging of the chest  was performed following the standard protocol without intravenous contrast. High resolution imaging of the lungs, as well as inspiratory and expiratory imaging, was performed. RADIATION DOSE REDUCTION: This exam was performed according to the departmental dose-optimization program which includes automated exposure control, adjustment of the mA and/or kV according to patient size and/or use of iterative reconstruction technique. COMPARISON:  No priors. FINDINGS: Cardiovascular: Heart size is normal. There is no significant pericardial fluid, thickening or pericardial calcification. There is aortic atherosclerosis, as well as atherosclerosis of the great vessels of the mediastinum and the coronary arteries, including calcified atherosclerotic plaque in the left main, left anterior descending, left circumflex and right coronary arteries. Mediastinum/Nodes: No pathologically enlarged mediastinal or hilar lymph nodes. Please note that accurate exclusion of hilar adenopathy is limited on noncontrast CT scans. Large hiatal hernia. No axillary lymphadenopathy. Lungs/Pleura: High-resolution images demonstrate some basal predominant areas of mild peripheral septal thickening and subpleural reticulation. Some mild cylindrical bronchiectasis and peripheral bronchiolectasis is also noted. No honeycombing. 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 There is also partial collapse of the trachea and mainstem bronchi during expiration indicative of tracheobronchomalacia. Upper Abdomen: Aortic atherosclerosis. Musculoskeletal: Chronic compression fracture of T12 with 40% loss of anterior vertebral body height and post vertebroplasty changes. There are no aggressive appearing lytic or blastic lesions noted in the visualized portions of the skeleton. IMPRESSION: 1. There are findings in the lungs which could indicate early or mild interstitial lung disease. These are technically  categorized as probable usual interstitial pneumonia (UIP), however, these may simply represent areas of mild chronic post infectious or inflammatory scarring. If there is persistent clinical concern for interstitial lung disease, repeat high-resolution chest CT would be recommended in 12 months to assess for temporal changes in the appearance of the lung parenchyma. 2. Tracheobronchomalacia. 3. 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 4. Aortic atherosclerosis, in addition to left main and 3 vessel coronary artery disease. Aortic Atherosclerosis (ICD10-I70.0). Electronically Signed   By: Vinnie Langton M.D.   On: 12/10/2022 08:54         Latest  Ref Rng & Units 10/21/2022   11:05 AM  PFT Results  FVC-Pre L 1.59   FVC-Predicted Pre % 74   FVC-Post L 1.61   FVC-Predicted Post % 76   Pre FEV1/FVC % % 86   Post FEV1/FCV % % 84   FEV1-Pre L 1.36   FEV1-Predicted Pre % 87   FEV1-Post L 1.35   DLCO uncorrected ml/min/mmHg 15.50   DLCO UNC% % 92   DLCO corrected ml/min/mmHg 15.31   DLCO COR %Predicted % 91   DLVA Predicted % 138   TLC L 2.54   TLC % Predicted % 55   RV % Predicted % 30     No results found for: "NITRICOXIDE"      Assessment & Plan:   Asthma Recurrent asthmatic bronchitic exacerbations questionable etiology.  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  Recent PFT showed some mild to moderate restriction with normal diffusing capacity.  Also patient has significant underlying reflux which seems to be controlled on PPI therapy.  Suspect ACE inhibitor is not helping her cough any.  Would recommend changing her ACE inhibitor if possible.  Would control for triggers such as postnasal drainage.  For now we will treat with empiric antibiotics and steroids.  Continue on Symbicort.  On return consider adding Spiriva or changing to Trelegy or Breztri.  Add in flutter valve therapy.  Also component of tracheobronchomalacia as well may be  contributing to ongoing cough and wheezing along with shortness of breath.   Plan  Patient Instructions  Please reach out to your Primary MD regarding Lotensin as it may be causing your cough to be worse.  We will reach out to pharmacy team to check on formulary to replace Symbicort .   Zpack take as directed.  Prednisone taper over next week  Continue on Symbicort 2 puffs Twice daily   Albuterol inhaler 1-2 puffs every 6hr as needed  Change Claritin to Zyrtec '10mg'$  daily  Saline nasal rinses As needed   Delsym 2 tsp Twice daily  for cough As needed   Tessalon Three times a day  for cough As needed   Begin Flutter valve Twice daily   Continue on Prilosec daily  Will send note to Dr. Trudie Reed.   Follow up with Dr. Shearon Stalls back in 6 weeks and As needed   Please contact office for sooner follow up if symptoms do not improve or worsen or seek emergency care         Allergic rhinitis Continue to control for triggers.  GERD Continue on current regimen.  Dyspnea on exertion Suspect is multifactorial.  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  Along with tracheobronchomalacia.  She has recurrent asthmatic headache exacerbations.   Plan  Patient Instructions  Please reach out to your Primary MD regarding Lotensin as it may be causing your cough to be worse.  We will reach out to pharmacy team to check on formulary to replace Symbicort .   Zpack take as directed.  Prednisone taper over next week  Continue on Symbicort 2 puffs Twice daily   Albuterol inhaler 1-2 puffs every 6hr as needed  Change Claritin to Zyrtec '10mg'$  daily  Saline nasal rinses As needed   Delsym 2 tsp Twice daily  for cough As needed   Tessalon Three times a day  for cough As needed   Begin Flutter valve Twice daily   Continue on Prilosec daily  Will send note to  Dr. Trudie Reed.   Follow up with Dr. Shearon Stalls back in 6 weeks and As needed   Please contact office for sooner follow up if  symptoms do not improve or worsen or seek emergency care         ILD (interstitial lung disease) ([place]) Interstitial lung disease changes on high-res CT chest.  They appear to be mild.  Her diffusing capacity on recent PFTs remains normal.  She has no desaturations.  She is following with rheumatology for osteoarthritis.  Hold on autoimmune connective tissue workup at this time.  But may need to further evaluate going forward.  Would recommend spirometry and DLCO in 6 months.  Plan  Patient Instructions  Please reach out to your Primary MD regarding Lotensin as it may be causing your cough to be worse.  We will reach out to pharmacy team to check on formulary to replace Symbicort .   Zpack take as directed.  Prednisone taper over next week  Continue on Symbicort 2 puffs Twice daily   Albuterol inhaler 1-2 puffs every 6hr as needed  Change Claritin to Zyrtec '10mg'$  daily  Saline nasal rinses As needed   Delsym 2 tsp Twice daily  for cough As needed   Tessalon Three times a day  for cough As needed   Begin Flutter valve Twice daily   Continue on Prilosec daily  Will send note to Dr. Trudie Reed.   Follow up with Dr. Shearon Stalls back in 6 weeks and As needed   Please contact office for sooner follow up if symptoms do not improve or worsen or seek emergency care          I spent  45  minutes dedicated to the care of this patient on the date of this encounter to include pre-visit review of records, face-to-face time with the patient discussing conditions above, post visit ordering of testing, clinical documentation with the electronic health record, making appropriate referrals as documented, and communicating necessary findings to members of the patients care team.   Rexene Edison, NP 12/16/2022

## 2022-12-16 NOTE — Progress Notes (Signed)
Patient seen in the office today and instructed on use of flutter valve.  Patient expressed understanding and demonstrated technique.

## 2022-12-16 NOTE — Assessment & Plan Note (Signed)
Continue to control for triggers.

## 2022-12-16 NOTE — Assessment & Plan Note (Signed)
Interstitial lung disease changes on high-res CT chest.  They appear to be mild.  Her diffusing capacity on recent PFTs remains normal.  She has no desaturations.  She is following with rheumatology for osteoarthritis.  Hold on autoimmune connective tissue workup at this time.  But may need to further evaluate going forward.  Would recommend spirometry and DLCO in 6 months.  Plan  Patient Instructions  Please reach out to your Primary MD regarding Lotensin as it may be causing your cough to be worse.  We will reach out to pharmacy team to check on formulary to replace Symbicort .   Zpack take as directed.  Prednisone taper over next week  Continue on Symbicort 2 puffs Twice daily   Albuterol inhaler 1-2 puffs every 6hr as needed  Change Claritin to Zyrtec '10mg'$  daily  Saline nasal rinses As needed   Delsym 2 tsp Twice daily  for cough As needed   Tessalon Three times a day  for cough As needed   Begin Flutter valve Twice daily   Continue on Prilosec daily  Will send note to Dr. Trudie Reed.   Follow up with Dr. Shearon Stalls back in 6 weeks and As needed   Please contact office for sooner follow up if symptoms do not improve or worsen or seek emergency care

## 2022-12-17 ENCOUNTER — Telehealth: Payer: Self-pay | Admitting: Internal Medicine

## 2022-12-17 ENCOUNTER — Other Ambulatory Visit (HOSPITAL_COMMUNITY): Payer: Self-pay

## 2022-12-17 NOTE — Telephone Encounter (Signed)
Patient is aware Rx list updated Appointment scheduled

## 2022-12-17 NOTE — Telephone Encounter (Signed)
Pt was last seen on 07/16/2022.  Pt states Pulmonologist has suggested MD change / send alternative Rx for   benazepril (LOTENSIN) 40 MG tablet   Because, as per Pt,  Pulmonologist believes that is what is causing Pt to cough excessively and trigger her asthma.  Please advise.  [id number] [phone] Fax: [phone]

## 2022-12-17 NOTE — Telephone Encounter (Signed)
Per test claims Brand Symbicort is covered at this time. Patient does still have a remaining deductible to meet for the plan. Alternatives also covered at this time are [facility].

## 2022-12-18 ENCOUNTER — Other Ambulatory Visit: Payer: Self-pay | Admitting: Internal Medicine

## 2022-12-18 DIAGNOSIS — D649 Anemia, unspecified: Secondary | ICD-10-CM

## 2022-12-24 NOTE — Telephone Encounter (Signed)
Kristy [name],  This is what the pharmacy team said regarding the Symbicort.  Thanks

## 2022-12-24 NOTE — Telephone Encounter (Signed)
Please have patient call her insurance to determine what is covered on her formulary. She was told that Symbicort was no longer covered but per our prior auth team, the brand Symbicort should still be covered. She does have a deductible to meet so this could be where the cost difference is coming from. Thanks.

## 2022-12-30 NOTE — Telephone Encounter (Signed)
Called  spoke with patient, advised of recommendations per Joellen Jersey, she stated she will call her insurance company to find out what is on the preferred list and then call us back.  Will await return call.

## 2023-01-01 NOTE — Telephone Encounter (Signed)
Pt. Calling back to give name of meds insurance will pay for Airduo restcick and breo ellipa

## 2023-01-04 DIAGNOSIS — M546 Pain in thoracic spine: Secondary | ICD-10-CM | POA: Diagnosis not present

## 2023-01-04 MED ORDER — FLUTICASONE-SALMETEROL 113-14 MCG/ACT IN AEPB: 2.0000 | INHALATION_SPRAY | Freq: Two times a day (BID) | RESPIRATORY_TRACT | 5 refills | Status: DC

## 2023-01-04 NOTE — Telephone Encounter (Signed)
I have sent AirDuo Respiclick to her pharmacy - 2 puffs Twice daily. Brush tongue and rinse mouth afterwards. Delivery method is a little different than her symbicort. The pharmacy should be able to show her how to use this. Side effect profile is similar to her Symbicort. Thanks.

## 2023-01-04 NOTE — Telephone Encounter (Signed)
Routing this info to [place] for her to review.  Katie, please route this back to triage pool, not directly back to me. Thanks!

## 2023-01-04 NOTE — Telephone Encounter (Signed)
Attempted to call pt but unable to reach. Left message to return call.  

## 2023-01-06 DIAGNOSIS — E669 Obesity, unspecified: Secondary | ICD-10-CM | POA: Diagnosis not present

## 2023-01-06 DIAGNOSIS — Z6830 Body mass index (BMI) 30.0-30.9, adult: Secondary | ICD-10-CM | POA: Diagnosis not present

## 2023-01-06 DIAGNOSIS — Z79899 Other long term (current) drug therapy: Secondary | ICD-10-CM | POA: Diagnosis not present

## 2023-01-06 DIAGNOSIS — M810 Age-related osteoporosis without current pathological fracture: Secondary | ICD-10-CM | POA: Diagnosis not present

## 2023-01-06 DIAGNOSIS — M154 Erosive (osteo)arthritis: Secondary | ICD-10-CM | POA: Diagnosis not present

## 2023-01-06 DIAGNOSIS — S22000A Wedge compression fracture of unspecified thoracic vertebra, initial encounter for closed fracture: Secondary | ICD-10-CM | POA: Diagnosis not present

## 2023-01-18 NOTE — Telephone Encounter (Signed)
Called and spoke with patient, she verified that she received the inhaler from her pharmacy and was shown how to used it.  I went over the instructions on use per [facility].  She verified understanding.  Nothing further needed.

## 2023-01-22 ENCOUNTER — Other Ambulatory Visit: Payer: Self-pay | Admitting: Cardiovascular Disease

## 2023-01-27 ENCOUNTER — Ambulatory Visit (INDEPENDENT_AMBULATORY_CARE_PROVIDER_SITE_OTHER): Payer: Medicare Other | Admitting: Internal Medicine

## 2023-01-27 ENCOUNTER — Encounter: Payer: Self-pay | Admitting: Internal Medicine

## 2023-01-27 ENCOUNTER — Other Ambulatory Visit: Payer: Self-pay | Admitting: Internal Medicine

## 2023-01-27 VITALS — BP 132/76 | HR 77 | Temp 98.0°F | Ht 60.0 in | Wt 163.4 lb

## 2023-01-27 DIAGNOSIS — K219 Gastro-esophageal reflux disease without esophagitis: Secondary | ICD-10-CM

## 2023-01-27 DIAGNOSIS — J4540 Moderate persistent asthma, uncomplicated: Secondary | ICD-10-CM

## 2023-01-27 DIAGNOSIS — R9389 Abnormal findings on diagnostic imaging of other specified body structures: Secondary | ICD-10-CM | POA: Diagnosis not present

## 2023-01-27 DIAGNOSIS — I152 Hypertension secondary to endocrine disorders: Secondary | ICD-10-CM

## 2023-01-27 DIAGNOSIS — E1159 Type 2 diabetes mellitus with other circulatory complications: Secondary | ICD-10-CM | POA: Diagnosis not present

## 2023-01-27 DIAGNOSIS — J398 Other specified diseases of upper respiratory tract: Secondary | ICD-10-CM | POA: Diagnosis not present

## 2023-01-27 MED ORDER — LOSARTAN POTASSIUM 25 MG PO TABS: 25.0000 mg | ORAL_TABLET | Freq: Every day | ORAL | 2 refills | Status: DC

## 2023-01-27 NOTE — Progress Notes (Signed)
[name]    [id number]    September 20, 1941  Primary Care Physician:Hernandez Priscella Mann, MD Date of Appointment: 01/27/2023 Established Patient Visit  Chief complaint:   Chief Complaint  Patient presents with   Follow-up    Pt states she is feeling okay. No c/o      HPI: Kristy [name] is a 82 y.o. 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   Interval Updates: Here for follow up. Overall over the past 6 months she feels like her breathing is better and gradually improved with inhaler therapy.   Switched to [employer] for insurance issues.   Sometimes with exertion she has episodes of dyspnea requiring albuterol which does relieve her symptoms.  Does have seasonal allergies and feels zyrtec helps her a lot.   Current Regimen: airduo respiclick 2 puffs twice a day, 1-2 times a week prn albuterol. Asthma Triggers: allergies, URI, exertion Exacerbations in the last year: History of hospitalization or intubation: never Allergy Testing: yes negative in 2023 on blood work GERD: yes on PPI Allergic Rhinitis: yes on zyrtec ACT:  Asthma Control Test ACT Total Score  01/27/2023  8:39 AM 20  12/16/2022  9:14 AM 14  08/12/2022  2:23 PM 15   FeNO:  I have reviewed the patient's family social and past medical history and updated as appropriate.   Past Medical History:  Diagnosis Date   Allergic rhinitis, seasonal    Arthritis    Asthma    bronchial with weather change   Diabetes mellitus without complication    type 2    Dysrhythmia    "Skipping a beat"   GERD (gastroesophageal reflux disease)    History of colon polyps    Hypertension    Pneumonia    Rotator cuff tear, right     Past Surgical History:  Procedure Laterality Date   BACK SURGERY     x3  ruptured disc    CHOLECYSTECTOMY OPEN  AGE 11   EYE SURGERY     bil cataracts   HERNIA REPAIR     INGUINAL HERNIA REPAIR Right AGE 11s   IR KYPHO THORACIC WITH BONE BIOPSY   10/30/2022   IR RADIOLOGIST EVAL & MGMT  10/07/2022   KNEE ARTHROSCOPY Left 2014   LUMBAR DISC SURGERY  x2  LAST DATE EARLY 2000s   ORIF ANKLE FRACTURE Right 06/24/2018   Procedure: OPEN REDUCTION INTERNAL FIXATION (ORIF) RIGHT ANKLE FRACTURE;  Surgeon: Kathryne Hitch, MD;  Location: [facility];  Service: Orthopedics;  Laterality: Right;   ROTATOR CUFF REPAIR Left 01/2015   TONSILLECTOMY  AGE 50   TOTAL KNEE ARTHROPLASTY Right 12/09/2020   Procedure: TOTAL KNEE ARTHROPLASTY;  Surgeon: Ollen Gross, MD;  Location: [facility];  Service: Orthopedics;  Laterality: Right;    VAGINAL HYSTERECTOMY  AGE 73   WITH BSO    Family History  Problem Relation Age of Onset   Colon cancer Neg Hx    Esophageal cancer Neg Hx    Rectal cancer Neg Hx    Stomach cancer Neg Hx    Asthma Neg Hx     Social History   Occupational History   Occupation: retired  Tobacco Use   Smoking status: Former    Years: 10    Types: Cigarettes    Quit date: 10/19/1988    Years since quitting: 34.2   Smokeless tobacco: Never  Vaping Use   Vaping Use: Never  used  Substance and Sexual Activity   Alcohol use: Yes    Alcohol/week: 3.0 - 4.0 standard drinks of alcohol    Types: 3 - 4 Glasses of wine per week    Comment: 2-3 times a week   Drug use: Never   Sexual activity: Not Currently     Physical Exam: Blood pressure 132/76, pulse 77, temperature 98 F (36.7 C), temperature source Oral, height 5' (1.524 m), weight 163 lb 6.4 oz (74.1 kg), SpO2 96 %.  Gen:      No acute distress ENT:  no nasal polyps, mucus membranes moist Lungs:    No increased respiratory effort, symmetric chest wall excursion, clear to auscultation bilaterally, no wheezes or crackles CV:         Regular rate and rhythm; no murmurs, rubs, or gallops.  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 Mild lower  lobe predominant subpleural stranding with few areas of bronchiectasis. Could be earlier ILD. No frank honeycombing.   PFTs:     Latest Ref Rng & Units 10/21/2022   11:05 AM  PFT Results  FVC-Pre L 1.59   FVC-Predicted Pre % 74   FVC-Post L 1.61   FVC-Predicted Post % 76   Pre FEV1/FVC % % 86   Post FEV1/FCV % % 84   FEV1-Pre L 1.36   FEV1-Predicted Pre % 87   FEV1-Post L 1.35   DLCO uncorrected ml/min/mmHg 15.50   DLCO UNC% % 92   DLCO corrected ml/min/mmHg 15.31   DLCO COR %Predicted % 91   DLVA Predicted % 138   TLC L 2.54   TLC % Predicted % 55   RV % Predicted % 30    I have personally reviewed the patient's PFTs and moderate restriction to ventilation based on Z score. Normal diffusion capacity.   Labs:  Immunization status: Immunization History  Administered Date(s) Administered   Fluad Quad(high Dose 65+) 07/05/2019, 08/22/2020, 07/13/2022   Influenza Split 09/23/2011, 09/09/2013   Influenza Whole 10/04/2007, 08/07/2008, 09/16/2009   Influenza, High Dose Seasonal PF 10/04/2015, 08/05/2016, 09/14/2017, 09/08/2018   Influenza,inj,Quad PF,6+ Mos 08/07/2014   Influenza,inj,quad, With Preservative 02/05/2021   Influenza-Unspecified 08/05/2021   Moderna Sars-Covid-2 Vaccination 12/17/2019   PFIZER Comirnaty(Gray Top)Covid-19 Tri-Sucrose Vaccine 02/05/2021, 07/13/2022   PFIZER(Purple Top)SARS-COV-2 Vaccination 11/07/2019, 11/28/2019, 07/18/2020   PNEUMOCOCCAL CONJUGATE-20 02/05/2021, 07/13/2022   Pfizer Covid-19 Vaccine Bivalent Booster 87yrs & up 08/05/2021   Pneumococcal Conjugate-13 10/09/2013   Pneumococcal Polysaccharide-23 04/03/2016   Tetanus 10/09/2013    External Records Personally Reviewed:   Assessment:  Moderate persistent asthma Allergic Rhinits 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  Continue airduo 2 puffs twice a day. Gargle after use.  Continue albuterol inhaler as needed.   Continue the prilosec medication for reflux and voice hoarseness.   Our NP had stopped ace-I due to concern for worsening cough. Hasn't been able to get in to PCP to start a new BP medication and her Bps have been running very high at home 170s SBP over 90s DBP. Will start losartan 25 mg daily. Will forward to PCP to follow up at next visit.   I do not think her CT Chest changes are related to ILD - could be related to aspiration given her LPR. No crackles on exam. At this point would follow with serial PFTs  and imaging repeated as needed. Suspect her restriction to ventilation with normal diffusion capacity likely secondary to body habitus.    Return to Care: Return in about 3 months (around 04/28/2023).   Durel SaltsNikita Daronte Shostak, MD Pulmonary and Critical Care Medicine [facility] Office:[phone]

## 2023-01-27 NOTE — Patient Instructions (Addendum)
Please schedule follow up scheduled with myself in 3 months.  If my schedule is not open yet, we will contact you with a reminder closer to that time. Please call [phone] if you haven't heard from [place] a month before.   Continue airduo. Continue allergy meds.   Take the albuterol rescue inhaler every 4 to 6 hours as needed for wheezing or shortness of breath. You can also take it 15 minutes before exercise or exertional activity.  I have started a new blood pressure medicine to substitute the benazapril. Start losartan 40 mg once daily. Continue your other blood pressure medications. Keep a log if your blood pressure readings to take to your next primary care office visit.   I have reviewed your CT scan and breathing testing - I do not think you have any serious lung conditions at this time such as interstitial lung disease. We will monitor with imaging over time if there is a change.

## 2023-02-01 ENCOUNTER — Encounter: Payer: Self-pay | Admitting: Internal Medicine

## 2023-02-01 ENCOUNTER — Ambulatory Visit (INDEPENDENT_AMBULATORY_CARE_PROVIDER_SITE_OTHER): Payer: Medicare Other | Admitting: Internal Medicine

## 2023-02-01 VITALS — BP 148/77 | HR 70 | Temp 98.4°F | Wt 163.0 lb

## 2023-02-01 DIAGNOSIS — I10 Essential (primary) hypertension: Secondary | ICD-10-CM

## 2023-02-01 DIAGNOSIS — J849 Interstitial pulmonary disease, unspecified: Secondary | ICD-10-CM | POA: Diagnosis not present

## 2023-02-01 DIAGNOSIS — R0609 Other forms of dyspnea: Secondary | ICD-10-CM

## 2023-02-01 DIAGNOSIS — E119 Type 2 diabetes mellitus without complications: Secondary | ICD-10-CM | POA: Diagnosis not present

## 2023-02-01 LAB — POCT GLYCOSYLATED HEMOGLOBIN (HGB A1C): Hemoglobin A1C: 6.3 % — AB (ref 4.0–5.6)

## 2023-02-01 NOTE — Progress Notes (Signed)
Established Patient Office Visit     CC/Reason for Visit: Follow-up chronic conditions  HPI: Kristy [name] is a 82 y.o. female who is coming in today for the above mentioned reasons. Past Medical History is significant for: Hypertension, type 2 diabetes, iron deficiency anemia, interstitial lung disease with dyspnea on exertion.  She is being followed by pulmonary.  Blood pressure has been elevated so at her recent visit with pulmonary this week she was started on losartan 25 mg which she has yet to pick up.  She is otherwise feeling well.   Past Medical/Surgical History: Past Medical History:  Diagnosis Date   Allergic rhinitis, seasonal    Arthritis    Asthma    bronchial with weather change   Diabetes mellitus without complication    type 2    Dysrhythmia    "Skipping a beat"   GERD (gastroesophageal reflux disease)    History of colon polyps    Hypertension    Pneumonia    Rotator cuff tear, right     Past Surgical History:  Procedure Laterality Date   BACK SURGERY     x3  ruptured disc    CHOLECYSTECTOMY OPEN  AGE 89   EYE SURGERY     bil cataracts   HERNIA REPAIR     INGUINAL HERNIA REPAIR Right AGE 89s   IR KYPHO THORACIC WITH BONE BIOPSY  10/30/2022   IR RADIOLOGIST EVAL & MGMT  10/07/2022   KNEE ARTHROSCOPY Left 2014   LUMBAR DISC SURGERY  x2  LAST DATE EARLY 2000s   ORIF ANKLE FRACTURE Right 06/24/2018   Procedure: OPEN REDUCTION INTERNAL FIXATION (ORIF) RIGHT ANKLE FRACTURE;  Surgeon: Kathryne Hitch, MD;  Location: [facility];  Service: Orthopedics;  Laterality: Right;   ROTATOR CUFF REPAIR Left 01/2015   TONSILLECTOMY  AGE 49   TOTAL KNEE ARTHROPLASTY Right 12/09/2020   Procedure: TOTAL KNEE ARTHROPLASTY;  Surgeon: Ollen Gross, MD;  Location: [facility];  Service: Orthopedics;  Laterality: Right;    VAGINAL HYSTERECTOMY  AGE 42   WITH BSO    Social History:  reports that she quit smoking about 34 years ago. Her smoking use included  cigarettes. She has never used smokeless tobacco. She reports current alcohol use of about 3.0 - 4.0 standard drinks of alcohol per week. She reports that she does not use drugs.  Allergies: Allergies  Allergen Reactions   Clonidine Hives    Family History:  Family History  Problem Relation Age of Onset   Colon cancer Neg Hx    Esophageal cancer Neg Hx    Rectal cancer Neg Hx    Stomach cancer Neg Hx    Asthma Neg Hx      Current Outpatient Medications:    albuterol (VENTOLIN HFA) 108 (90 Base) MCG/ACT inhaler, INHALE 2 PUFFS BY MOUTH EVERY 6 HOURS AS NEEDED FOR WHEEZING, Disp: 6.7 each, Rfl: 1   amLODipine (NORVASC) 10 MG tablet, TAKE 1 TABLET BY MOUTH EVERY DAY, Disp: 90 tablet, Rfl: 1   celecoxib (CELEBREX) 200 MG capsule, Take 200 mg by mouth daily., Disp: , Rfl:    ferrous sulfate 325 (65 FE) MG tablet, TAKE 1 TABLET BY MOUTH TWICE A DAY WITH FOOD, Disp: 100 tablet, Rfl: 2   Fluticasone-Salmeterol (AIRDUO RESPICLICK 113/14) 113-14 MCG/ACT AEPB, Inhale 2 puffs into the lungs in the morning and at bedtime., Disp: 1 each, Rfl: 5   hydrochlorothiazide (HYDRODIURIL) 12.5 MG tablet, Take 1 tablet (12.5  mg total) by mouth daily., Disp: 90 tablet, Rfl: 3   losartan (COZAAR) 25 MG tablet, Take 1 tablet (25 mg total) by mouth daily., Disp: 30 tablet, Rfl: 2   metFORMIN (GLUCOPHAGE) 500 MG tablet, TAKE 1 TABLET BY MOUTH TWICE A DAY WITH FOOD, Disp: 180 tablet, Rfl: 1   metoprolol succinate (TOPROL-XL) 50 MG 24 hr tablet, TAKE 1 TABLET BY MOUTH DAILY. PATIENT NEED A APPOINTMENT FOR FUTURE REFILLS 2ND ATTEMPT, Disp: 90 tablet, Rfl: 1   omeprazole (PRILOSEC) 20 MG capsule, Take 1 capsule (20 mg total) by mouth 2 (two) times daily before a meal., Disp: 180 capsule, Rfl: 3   Spacer/Aero-Holding Chambers DEVI, Use with Symbicort inhaler, Disp: 1 each, Rfl: 2   traMADol (ULTRAM) 50 MG tablet, Take by mouth every 6 (six) hours as needed., Disp: , Rfl:   Review of Systems:  Negative unless  indicated in HPI.   Physical Exam: Vitals:   02/01/23 0908 02/01/23 0912  BP: (!) 149/80 (!) 148/77  Pulse: 70   Temp: 98.4 F (36.9 C)   TempSrc: Oral   SpO2: 98%   Weight: 163 lb (73.9 kg)     Body mass index is 31.83 kg/m.   Physical Exam Vitals reviewed.  Constitutional:      Appearance: Normal appearance.  HENT:     Head: Normocephalic and atraumatic.  Eyes:     Conjunctiva/sclera: Conjunctivae normal.     Pupils: Pupils are equal, round, and reactive to light.  Cardiovascular:     Rate and Rhythm: Normal rate and regular rhythm.  Pulmonary:     Effort: Pulmonary effort is normal.     Breath sounds: Normal breath sounds.  Skin:    General: Skin is warm and dry.  Neurological:     General: No focal deficit present.     Mental Status: She is alert and oriented to person, place, and time.  Psychiatric:        Mood and Affect: Mood normal.        Behavior: Behavior normal.        Thought Content: Thought content normal.        Judgment: Judgment normal.      Impression and Plan:  Type 2 diabetes mellitus without complication, unspecified whether long term insulin use - Plan: POC HgB A1c  Essential hypertension  ILD (interstitial lung disease)  Dyspnea on exertion  -A1c of 6.3 demonstrates excellent diabetic management. -Blood pressure is elevated, she was just started on losartan 25 mg earlier this week by pulmonary, she has not picked up medication yet.  She will start, do ambulatory blood pressure measurements and return in 6 to 8 weeks for follow-up. -Dyspnea on exertion is improved, she has been followed closely by pulmonary.  Time spent:32 minutes reviewing chart, interviewing and examining patient and formulating plan of care.     Chaya Jan, MD  Primary Care at [facility]

## 2023-02-17 DIAGNOSIS — Z23 Encounter for immunization: Secondary | ICD-10-CM | POA: Diagnosis not present

## 2023-03-10 DIAGNOSIS — J3089 Other allergic rhinitis: Secondary | ICD-10-CM

## 2023-03-10 DIAGNOSIS — I10 Essential (primary) hypertension: Secondary | ICD-10-CM

## 2023-03-10 DIAGNOSIS — K219 Gastro-esophageal reflux disease without esophagitis: Secondary | ICD-10-CM

## 2023-03-10 DIAGNOSIS — J4540 Moderate persistent asthma, uncomplicated: Secondary | ICD-10-CM

## 2023-03-10 DIAGNOSIS — E119 Type 2 diabetes mellitus without complications: Secondary | ICD-10-CM

## 2023-03-11 ENCOUNTER — Other Ambulatory Visit: Payer: Self-pay | Admitting: Internal Medicine

## 2023-03-11 DIAGNOSIS — I10 Essential (primary) hypertension: Secondary | ICD-10-CM

## 2023-03-22 ENCOUNTER — Ambulatory Visit (INDEPENDENT_AMBULATORY_CARE_PROVIDER_SITE_OTHER): Payer: Medicare Other | Admitting: Internal Medicine

## 2023-03-22 VITALS — BP 164/87 | HR 76 | Temp 98.2°F | Wt 162.0 lb

## 2023-03-22 DIAGNOSIS — I152 Hypertension secondary to endocrine disorders: Secondary | ICD-10-CM

## 2023-03-22 DIAGNOSIS — E1159 Type 2 diabetes mellitus with other circulatory complications: Secondary | ICD-10-CM

## 2023-03-22 DIAGNOSIS — I10 Essential (primary) hypertension: Secondary | ICD-10-CM | POA: Diagnosis not present

## 2023-03-22 DIAGNOSIS — J849 Interstitial pulmonary disease, unspecified: Secondary | ICD-10-CM

## 2023-03-22 DIAGNOSIS — E119 Type 2 diabetes mellitus without complications: Secondary | ICD-10-CM

## 2023-03-22 DIAGNOSIS — Z7984 Long term (current) use of oral hypoglycemic drugs: Secondary | ICD-10-CM | POA: Diagnosis not present

## 2023-03-22 MED ORDER — LOSARTAN POTASSIUM 100 MG PO TABS: 100.0000 mg | ORAL_TABLET | Freq: Every day | ORAL | 1 refills | Status: DC

## 2023-03-22 NOTE — Progress Notes (Signed)
Established Patient Office Visit     CC/Reason for Visit: Follow-up blood pressure and other chronic conditions   HPI: Kristy [name] is a 82 y.o. female who is coming in today for the above mentioned reasons. Past Medical History is significant for: Hypertension, type 2 diabetes, interstitial lung disease and iron deficiency anemia.  She was started on losartan 25 mg by pulmonary in April for blood pressure.  She is here today for follow-up.  Ambulatory BP measurements remain elevated.  In office blood pressure is elevated as well.  She is otherwise feeling well   Past Medical/Surgical History: Past Medical History:  Diagnosis Date   Allergic rhinitis, seasonal    Arthritis    Asthma    bronchial with weather change   Diabetes mellitus without complication (HCC)    type 2    Dysrhythmia    "Skipping a beat"   GERD (gastroesophageal reflux disease)    History of colon polyps    Hypertension    Pneumonia    Rotator cuff tear, right     Past Surgical History:  Procedure Laterality Date   BACK SURGERY     x3  ruptured disc    CHOLECYSTECTOMY OPEN  AGE 62   EYE SURGERY     bil cataracts   HERNIA REPAIR     INGUINAL HERNIA REPAIR Right AGE 62s   IR KYPHO THORACIC WITH BONE BIOPSY  10/30/2022   IR RADIOLOGIST EVAL & MGMT  10/07/2022   KNEE ARTHROSCOPY Left 2014   LUMBAR DISC SURGERY  x2  LAST DATE EARLY 2000s   ORIF ANKLE FRACTURE Right 06/24/2018   Procedure: OPEN REDUCTION INTERNAL FIXATION (ORIF) RIGHT ANKLE FRACTURE;  Surgeon: Kathryne Hitch, MD;  Location: [facility];  Service: Orthopedics;  Laterality: Right;   ROTATOR CUFF REPAIR Left 01/2015   TONSILLECTOMY  AGE 21   TOTAL KNEE ARTHROPLASTY Right 12/09/2020   Procedure: TOTAL KNEE ARTHROPLASTY;  Surgeon: Ollen Gross, MD;  Location: [facility];  Service: Orthopedics;  Laterality: Right;    VAGINAL HYSTERECTOMY  AGE 62   WITH BSO    Social History:  reports that she quit smoking about 34 years ago. Her  smoking use included cigarettes. She has never used smokeless tobacco. She reports current alcohol use of about 3.0 - 4.0 standard drinks of alcohol per week. She reports that she does not use drugs.  Allergies: Allergies  Allergen Reactions   Clonidine Hives    Family History:  Family History  Problem Relation Age of Onset   Colon cancer Neg Hx    Esophageal cancer Neg Hx    Rectal cancer Neg Hx    Stomach cancer Neg Hx    Asthma Neg Hx      Current Outpatient Medications:    albuterol (VENTOLIN HFA) 108 (90 Base) MCG/ACT inhaler, INHALE 2 PUFFS BY MOUTH EVERY 6 HOURS AS NEEDED FOR WHEEZING, Disp: 6.7 each, Rfl: 1   amLODipine (NORVASC) 10 MG tablet, TAKE 1 TABLET BY MOUTH EVERY DAY, Disp: 90 tablet, Rfl: 1   celecoxib (CELEBREX) 200 MG capsule, Take 200 mg by mouth daily., Disp: , Rfl:    ferrous sulfate 325 (65 FE) MG tablet, TAKE 1 TABLET BY MOUTH TWICE A DAY WITH FOOD, Disp: 100 tablet, Rfl: 2   Fluticasone-Salmeterol (AIRDUO RESPICLICK 113/14) 113-14 MCG/ACT AEPB, Inhale 2 puffs into the lungs in the morning and at bedtime., Disp: 1 each, Rfl: 5   hydrochlorothiazide (HYDRODIURIL) 12.5 MG tablet,  Take 1 tablet (12.5 mg total) by mouth daily., Disp: 90 tablet, Rfl: 3   metFORMIN (GLUCOPHAGE) 500 MG tablet, TAKE 1 TABLET BY MOUTH TWICE A DAY WITH FOOD, Disp: 180 tablet, Rfl: 1   metoprolol succinate (TOPROL-XL) 50 MG 24 hr tablet, TAKE 1 TABLET BY MOUTH DAILY. PATIENT NEED A APPOINTMENT FOR FUTURE REFILLS 2ND ATTEMPT, Disp: 90 tablet, Rfl: 1   omeprazole (PRILOSEC) 20 MG capsule, Take 1 capsule (20 mg total) by mouth 2 (two) times daily before a meal., Disp: 180 capsule, Rfl: 3   Spacer/Aero-Holding Chambers DEVI, Use with Symbicort inhaler, Disp: 1 each, Rfl: 2   traMADol (ULTRAM) 50 MG tablet, Take by mouth every 6 (six) hours as needed., Disp: , Rfl:    losartan (COZAAR) 100 MG tablet, Take 1 tablet (100 mg total) by mouth daily., Disp: 90 tablet, Rfl: 1  Review of Systems:   Negative unless indicated in HPI.   Physical Exam: Vitals:   03/22/23 1009 03/22/23 1011  BP: (!) 157/84 (!) 164/87  Pulse: 76   Temp: 98.2 F (36.8 C)   TempSrc: Oral   SpO2: 97%   Weight: 162 lb (73.5 kg)     Body mass index is 31.64 kg/m.   Physical Exam Vitals reviewed.  Constitutional:      Appearance: Normal appearance.  HENT:     Head: Normocephalic and atraumatic.  Eyes:     Conjunctiva/sclera: Conjunctivae normal.     Pupils: Pupils are equal, round, and reactive to light.  Cardiovascular:     Rate and Rhythm: Normal rate and regular rhythm.  Pulmonary:     Effort: Pulmonary effort is normal.     Breath sounds: Normal breath sounds.  Musculoskeletal:     Right lower leg: Edema present.     Left lower leg: Edema present.  Skin:    General: Skin is warm and dry.  Neurological:     General: No focal deficit present.     Mental Status: She is alert and oriented to person, place, and time.  Psychiatric:        Mood and Affect: Mood normal.        Behavior: Behavior normal.        Thought Content: Thought content normal.        Judgment: Judgment normal.      Impression and Plan:  Essential hypertension Assessment & Plan: Not well controlled. Continue amlopidine 10 mg. Increase losartan to 100 mg daily. Return in 6-8 weeks for follow up.   Type 2 diabetes mellitus without complication, without long-term current use of insulin (HCC) Assessment & Plan: Well controlled. Most recent A1c was 6.3 in 4/24.   ILD (interstitial lung disease) (HCC) Assessment & Plan: Stable. Followed by pulmonary.   Hypertension associated with diabetes (HCC) -     Losartan Potassium; Take 1 tablet (100 mg total) by mouth daily.  Dispense: 90 tablet; Refill: 1     Time spent:32 minutes reviewing chart, interviewing and examining patient and formulating plan of care.     Chaya Jan, MD [place] [facility] at [facility]

## 2023-03-22 NOTE — Assessment & Plan Note (Signed)
Stable. Followed by pulmonary. °

## 2023-03-22 NOTE — Assessment & Plan Note (Signed)
Not well controlled. Continue amlopidine 10 mg. Increase losartan to 100 mg daily. Return in 6-8 weeks for follow up.

## 2023-03-22 NOTE — Assessment & Plan Note (Signed)
Well controlled. Most recent A1c was 6.3 in 4/24.

## 2023-03-23 NOTE — Telephone Encounter (Signed)
-----   Message from Angela H Herring, RPH sent at 03/23/2023  9:38 AM EDT ----- Regarding: 2300 Pharmacy Referral Hello,   Patient is currently flagged as high risk for hospitalization and is eligible to meet with me through the Care Coordination program.   Could a 2300-Pharmacy referral for medication management be placed for the patient so that I can get them scheduled?   Thank you!  Angela H Herring  Clinical Pharmacist  [phone]    

## 2023-03-29 ENCOUNTER — Ambulatory Visit
Admission: RE | Admit: 2023-03-29 | Discharge: 2023-03-29 | Disposition: A | Discharge status: Home / Self Care | Payer: Medicare Other | Source: Ambulatory Visit | Attending: Internal Medicine | Admitting: Internal Medicine

## 2023-03-29 DIAGNOSIS — Z13820 Encounter for screening for osteoporosis: Secondary | ICD-10-CM

## 2023-04-01 ENCOUNTER — Other Ambulatory Visit: Payer: Self-pay | Admitting: Internal Medicine

## 2023-04-01 DIAGNOSIS — E119 Type 2 diabetes mellitus without complications: Secondary | ICD-10-CM

## 2023-04-05 DIAGNOSIS — Z1231 Encounter for screening mammogram for malignant neoplasm of breast: Secondary | ICD-10-CM | POA: Diagnosis not present

## 2023-04-05 LAB — HM MAMMOGRAPHY

## 2023-04-13 ENCOUNTER — Other Ambulatory Visit: Payer: Medicare Other

## 2023-04-16 NOTE — Progress Notes (Unsigned)
04/16/2023 Name: Kristy [name] MRN: [id number] DOB: 1941/10/16  Chief Complaint  Patient presents with   Medication Management   Kristy [name] is a 82 y.o. year old female who presented for a telephone visit.   They were referred to the pharmacist by their PCP for assistance in managing hypertension.   Subjective:  Care Team: Primary Care Provider: Philip Aspen, Limmie Patricia, MD ; Next Scheduled Visit: 05/03/23  Medication Access/Adherence  Current Pharmacy:  [id number] [place], [place] - [address] [place] [address] Phone: [phone] Fax: [phone]  [facility] [address] [place] - [address] [place] [address] Phone: [phone] Fax: [phone]  [facility] [place] [place] - [address] [place] [address] Phone: [phone] Fax: [phone]  -Patient reports affordability concerns with their medications: No  -Patient reports access/transportation concerns to their pharmacy: No  -Patient reports adherence concerns with their medications:  No  Uses weekly pill organizer, endorses good adherence with no barriers  Hypertension: Current medications: amlodipine 10mg  daily, hydrochlorothiazide 12.5mg  daily, losartan 100mg  daily, metoprolol xl 50mg  daily -Patient has a validated, automated, upper arm home BP cuff -Current blood pressure readings readings: unable to provide but states she is checking twice daily, and BP has been "normal" -Patient denies hypotensive s/sx including dizziness, lightheadedness.  -Patient denies hypertensive symptoms including headache, chest pain, shortness of breath  Objective: Lab Results  Component Value Date   CREATININE 0.73 10/30/2022   BUN 11 10/30/2022   NA 137 10/30/2022   K 3.5 10/30/2022   CL 98 10/30/2022   CO2 30 10/30/2022   Medications Reviewed Today     Reviewed by Lenna Gilford, RPH (Pharmacist) on 04/13/23 at 1513  Med List Status: <None>   Medication Order Taking? Sig Documenting Provider Last Dose Status Informant  albuterol (VENTOLIN HFA) 108 (90 Base) MCG/ACT inhaler [id number] Yes INHALE 2 PUFFS BY MOUTH EVERY 6 HOURS AS NEEDED FOR WHEEZING Parrett, Tammy S, NP Taking Active   alendronate (FOSAMAX) 70 MG tablet [id number] Yes Take 70 mg by mouth once a week. Take with a full glass of water on an empty stomach. [provider] Taking Active   amLODipine (NORVASC) 10 MG tablet [id number] Yes TAKE 1 TABLET BY MOUTH EVERY DAY Philip Aspen, Limmie Patricia, MD Taking Active   celecoxib (CELEBREX) 200 MG capsule [id number] Yes Take 200 mg by mouth daily. [provider] Taking Active   ferrous sulfate 325 (65 FE) MG tablet [id number] Yes TAKE 1 TABLET BY MOUTH TWICE A DAY WITH FOOD Philip Aspen, Limmie Patricia, MD Taking Active            Med Note Littie Deeds, [place] A   Tue Apr 13, 2023  3:10 PM) Once daily  Fluticasone-Salmeterol [facility] RESPICLICK 113/14) 113-14 MCG/ACT AEPB [id number] Yes Inhale 2 puffs into the lungs in the morning and at bedtime. Noemi Chapel, NP Taking Active   hydrochlorothiazide (HYDRODIURIL) 12.5 MG tablet [id number] Yes Take 1 tablet (12.5 mg total) by mouth daily. Lennette Bihari, MD Taking Active   losartan (COZAAR) 100 MG tablet [id number] Yes Take 1 tablet (100 mg total) by mouth daily. Philip Aspen, Limmie Patricia, MD Taking Active   metFORMIN (GLUCOPHAGE) 500 MG tablet [id number] Yes TAKE 1 TABLET BY MOUTH TWICE A DAY WITH FOOD Philip Aspen, Limmie Patricia, MD Taking Active   metoprolol succinate (TOPROL-XL) 50 MG 24 hr tablet  [id number] Yes TAKE 1 TABLET BY MOUTH DAILY. PATIENT NEED A APPOINTMENT FOR FUTURE REFILLS 2ND ATTEMPT Lennette Bihari, MD Taking Active   omeprazole (PRILOSEC) 20 MG capsule [id number] Yes Take 1 capsule (20 mg total) by mouth 2 (two) times daily before a meal. Hilarie Fredrickson, MD Taking Active            Med  Note Littie Deeds, [place] A   Tue Apr 13, 2023  3:12 PM) Taking once daily- has diarrhea if taking BID  Spacer/Aero-Holding Rudean Curt [id number] Yes Use with Symbicort inhaler Noemi Chapel, NP Taking Active   traMADol (ULTRAM) 50 MG tablet [id number] Yes Take by mouth every 6 (six) hours as needed. [provider] Taking Active            Assessment/Plan:   Hypertension: - Currently uncontrolled - Recommend to check BP daily, record, and bring values to upcoming PCP appointment on 7/15 - If SBP consistently >140 or DBP >90, I recommend increasing hydrochlorothiazide to 25mg  daily or changing to chlorthalidone 25mg  daily  - If medication changes are made I recommend f/u in 4-6 weeks to reassess BP and CMP  Follow Up Plan: None scheduled but can as needed per PCP  Lenna Gilford, PharmD, DPLA

## 2023-04-20 DIAGNOSIS — Z79899 Other long term (current) drug therapy: Secondary | ICD-10-CM | POA: Diagnosis not present

## 2023-04-20 DIAGNOSIS — M810 Age-related osteoporosis without current pathological fracture: Secondary | ICD-10-CM | POA: Diagnosis not present

## 2023-04-20 DIAGNOSIS — M154 Erosive (osteo)arthritis: Secondary | ICD-10-CM | POA: Diagnosis not present

## 2023-04-20 DIAGNOSIS — Z6829 Body mass index (BMI) 29.0-29.9, adult: Secondary | ICD-10-CM | POA: Diagnosis not present

## 2023-04-20 DIAGNOSIS — S22000A Wedge compression fracture of unspecified thoracic vertebra, initial encounter for closed fracture: Secondary | ICD-10-CM | POA: Diagnosis not present

## 2023-04-20 DIAGNOSIS — M545 Low back pain, unspecified: Secondary | ICD-10-CM | POA: Diagnosis not present

## 2023-04-20 DIAGNOSIS — E663 Overweight: Secondary | ICD-10-CM | POA: Diagnosis not present

## 2023-04-27 ENCOUNTER — Other Ambulatory Visit: Payer: Self-pay | Admitting: Internal Medicine

## 2023-04-27 DIAGNOSIS — M546 Pain in thoracic spine: Secondary | ICD-10-CM | POA: Diagnosis not present

## 2023-04-27 DIAGNOSIS — M5136 Other intervertebral disc degeneration, lumbar region: Secondary | ICD-10-CM | POA: Diagnosis not present

## 2023-04-27 DIAGNOSIS — M4854XD Collapsed vertebra, not elsewhere classified, thoracic region, subsequent encounter for fracture with routine healing: Secondary | ICD-10-CM | POA: Diagnosis not present

## 2023-04-27 DIAGNOSIS — E1159 Type 2 diabetes mellitus with other circulatory complications: Secondary | ICD-10-CM

## 2023-05-03 ENCOUNTER — Ambulatory Visit: Payer: Medicare Other | Admitting: Internal Medicine

## 2023-05-03 ENCOUNTER — Encounter: Payer: Self-pay | Admitting: Internal Medicine

## 2023-05-03 VITALS — BP 120/70 | HR 73 | Temp 98.0°F | Wt 162.5 lb

## 2023-05-03 DIAGNOSIS — I10 Essential (primary) hypertension: Secondary | ICD-10-CM | POA: Diagnosis not present

## 2023-05-03 DIAGNOSIS — J849 Interstitial pulmonary disease, unspecified: Secondary | ICD-10-CM

## 2023-05-03 DIAGNOSIS — Z1211 Encounter for screening for malignant neoplasm of colon: Secondary | ICD-10-CM

## 2023-05-03 DIAGNOSIS — E119 Type 2 diabetes mellitus without complications: Secondary | ICD-10-CM | POA: Diagnosis not present

## 2023-05-03 LAB — POCT GLYCOSYLATED HEMOGLOBIN (HGB A1C): Hemoglobin A1C: 6 % — AB (ref 4.0–5.6)

## 2023-05-03 NOTE — Progress Notes (Signed)
Established Patient Office Visit     CC/Reason for Visit: Follow-up chronic conditions  HPI: Kristy [name] is a 82 y.o. female who is coming in today for the above mentioned reasons. Past Medical History is significant for: Hypertension, type 2 diabetes, interstitial lung disease.  She has been feeling well and has no acute concerns or complaints other than her chronic low back pain for which she is being followed by.  She just completed a prednisone taper and is giving consideration to epidural steroid injections.   Past Medical/Surgical History: Past Medical History:  Diagnosis Date   Allergic rhinitis, seasonal    Arthritis    Asthma    bronchial with weather change   Diabetes mellitus without complication (HCC)    type 2    Dysrhythmia    "Skipping a beat"   GERD (gastroesophageal reflux disease)    History of colon polyps    Hypertension    Pneumonia    Rotator cuff tear, right     Past Surgical History:  Procedure Laterality Date   BACK SURGERY     x3  ruptured disc    CHOLECYSTECTOMY OPEN  AGE 81   EYE SURGERY     bil cataracts   HERNIA REPAIR     INGUINAL HERNIA REPAIR Right AGE 81s   IR KYPHO THORACIC WITH BONE BIOPSY  10/30/2022   IR RADIOLOGIST EVAL & MGMT  10/07/2022   KNEE ARTHROSCOPY Left 2014   LUMBAR DISC SURGERY  x2  LAST DATE EARLY 2000s   ORIF ANKLE FRACTURE Right 06/24/2018   Procedure: OPEN REDUCTION INTERNAL FIXATION (ORIF) RIGHT ANKLE FRACTURE;  Surgeon: Kathryne Hitch, MD;  Location: [facility];  Service: Orthopedics;  Laterality: Right;   ROTATOR CUFF REPAIR Left 01/2015   TONSILLECTOMY  AGE 66   TOTAL KNEE ARTHROPLASTY Right 12/09/2020   Procedure: TOTAL KNEE ARTHROPLASTY;  Surgeon: Ollen Gross, MD;  Location: [facility];  Service: Orthopedics;  Laterality: Right;    VAGINAL HYSTERECTOMY  AGE 44   WITH BSO    Social History:  reports that she quit smoking about 34 years ago. Her smoking use included cigarettes. She started  smoking about 44 years ago. She has never used smokeless tobacco. She reports current alcohol use of about 3.0 - 4.0 standard drinks of alcohol per week. She reports that she does not use drugs.  Allergies: Allergies  Allergen Reactions   Clonidine Hives    Family History:  Family History  Problem Relation Age of Onset   Colon cancer Neg Hx    Esophageal cancer Neg Hx    Rectal cancer Neg Hx    Stomach cancer Neg Hx    Asthma Neg Hx      Current Outpatient Medications:    albuterol (VENTOLIN HFA) 108 (90 Base) MCG/ACT inhaler, INHALE 2 PUFFS BY MOUTH EVERY 6 HOURS AS NEEDED FOR WHEEZING, Disp: 6.7 each, Rfl: 1   alendronate (FOSAMAX) 70 MG tablet, Take 70 mg by mouth once a week. Take with a full glass of water on an empty stomach., Disp: , Rfl:    amLODipine (NORVASC) 10 MG tablet, TAKE 1 TABLET BY MOUTH EVERY DAY, Disp: 90 tablet, Rfl: 1   celecoxib (CELEBREX) 200 MG capsule, Take 200 mg by mouth daily., Disp: , Rfl:    ferrous sulfate 325 (65 FE) MG tablet, TAKE 1 TABLET BY MOUTH TWICE A DAY WITH FOOD, Disp: 100 tablet, Rfl: 2   Fluticasone-Salmeterol (AIRDUO RESPICLICK 113/14)  113-14 MCG/ACT AEPB, Inhale 2 puffs into the lungs in the morning and at bedtime., Disp: 1 each, Rfl: 5   hydrochlorothiazide (HYDRODIURIL) 12.5 MG tablet, Take 1 tablet (12.5 mg total) by mouth daily., Disp: 90 tablet, Rfl: 3   losartan (COZAAR) 100 MG tablet, Take 1 tablet (100 mg total) by mouth daily., Disp: 90 tablet, Rfl: 1   metFORMIN (GLUCOPHAGE) 500 MG tablet, TAKE 1 TABLET BY MOUTH TWICE A DAY WITH FOOD, Disp: 180 tablet, Rfl: 1   metoprolol succinate (TOPROL-XL) 50 MG 24 hr tablet, TAKE 1 TABLET BY MOUTH DAILY. PATIENT NEED A APPOINTMENT FOR FUTURE REFILLS 2ND ATTEMPT, Disp: 90 tablet, Rfl: 1   omeprazole (PRILOSEC) 20 MG capsule, Take 1 capsule (20 mg total) by mouth 2 (two) times daily before a meal., Disp: 180 capsule, Rfl: 3   Spacer/Aero-Holding Chambers DEVI, Use with Symbicort inhaler, Disp:  1 each, Rfl: 2   traMADol (ULTRAM) 50 MG tablet, Take by mouth every 6 (six) hours as needed., Disp: , Rfl:   Review of Systems:  Negative unless indicated in HPI.   Physical Exam: Vitals:   05/03/23 1049  BP: 120/70  Pulse: 73  Temp: 98 F (36.7 C)  TempSrc: Oral  SpO2: 95%  Weight: 162 lb 8 oz (73.7 kg)    Body mass index is 31.74 kg/m.   Physical Exam Vitals reviewed.  Constitutional:      Appearance: Normal appearance.  HENT:     Head: Normocephalic and atraumatic.  Eyes:     Conjunctiva/sclera: Conjunctivae normal.     Pupils: Pupils are equal, round, and reactive to light.  Cardiovascular:     Rate and Rhythm: Normal rate and regular rhythm.  Pulmonary:     Effort: Pulmonary effort is normal.     Breath sounds: Normal breath sounds.  Skin:    General: Skin is warm and dry.  Neurological:     General: No focal deficit present.     Mental Status: She is alert and oriented to person, place, and time.  Psychiatric:        Mood and Affect: Mood normal.        Behavior: Behavior normal.        Thought Content: Thought content normal.        Judgment: Judgment normal.      Impression and Plan:  Screening for malignant neoplasm of colon -     Ambulatory referral to Gastroenterology  Type 2 diabetes mellitus without complication, without long-term current use of insulin (HCC) Assessment & Plan: A1c demonstrates excellent control at 6.0.  Orders: -     POCT glycosylated hemoglobin (Hb A1C)  Essential hypertension Assessment & Plan: Well-controlled.  She is on amlodipine 10 mg and losartan 100 mg daily.   ILD (interstitial lung disease) (HCC) Assessment & Plan: Followed by pulmonary, stable.      Time spent:31 minutes reviewing chart, interviewing and examining patient and formulating plan of care.     Chaya Jan, MD [facility]

## 2023-05-03 NOTE — Assessment & Plan Note (Signed)
 Followed by pulmonary, stable.

## 2023-05-03 NOTE — Assessment & Plan Note (Signed)
A1c demonstrates excellent control at 6.0.

## 2023-05-03 NOTE — Assessment & Plan Note (Signed)
Well-controlled.  She is on amlodipine 10 mg and losartan 100 mg daily.

## 2023-05-21 ENCOUNTER — Other Ambulatory Visit: Payer: Self-pay | Admitting: Internal Medicine

## 2023-05-21 DIAGNOSIS — D649 Anemia, unspecified: Secondary | ICD-10-CM

## 2023-05-25 DIAGNOSIS — M5414 Radiculopathy, thoracic region: Secondary | ICD-10-CM | POA: Diagnosis not present

## 2023-05-28 ENCOUNTER — Other Ambulatory Visit: Payer: Self-pay | Admitting: Internal Medicine

## 2023-05-28 DIAGNOSIS — K219 Gastro-esophageal reflux disease without esophagitis: Secondary | ICD-10-CM

## 2023-06-14 ENCOUNTER — Other Ambulatory Visit: Payer: Self-pay | Admitting: Internal Medicine

## 2023-06-14 DIAGNOSIS — Z5181 Encounter for therapeutic drug level monitoring: Secondary | ICD-10-CM | POA: Diagnosis not present

## 2023-06-14 DIAGNOSIS — Z79899 Other long term (current) drug therapy: Secondary | ICD-10-CM | POA: Diagnosis not present

## 2023-06-14 DIAGNOSIS — M5136 Other intervertebral disc degeneration, lumbar region: Secondary | ICD-10-CM | POA: Diagnosis not present

## 2023-06-14 DIAGNOSIS — M4854XD Collapsed vertebra, not elsewhere classified, thoracic region, subsequent encounter for fracture with routine healing: Secondary | ICD-10-CM | POA: Diagnosis not present

## 2023-06-14 DIAGNOSIS — M5412 Radiculopathy, cervical region: Secondary | ICD-10-CM | POA: Diagnosis not present

## 2023-06-14 DIAGNOSIS — M545 Low back pain, unspecified: Secondary | ICD-10-CM | POA: Diagnosis not present

## 2023-06-14 DIAGNOSIS — I10 Essential (primary) hypertension: Secondary | ICD-10-CM

## 2023-06-14 DIAGNOSIS — M5414 Radiculopathy, thoracic region: Secondary | ICD-10-CM | POA: Diagnosis not present

## 2023-06-14 DIAGNOSIS — M546 Pain in thoracic spine: Secondary | ICD-10-CM | POA: Diagnosis not present

## 2023-06-14 DIAGNOSIS — Z96651 Presence of right artificial knee joint: Secondary | ICD-10-CM | POA: Diagnosis not present

## 2023-06-22 ENCOUNTER — Encounter: Payer: Self-pay | Admitting: Internal Medicine

## 2023-06-22 ENCOUNTER — Ambulatory Visit (INDEPENDENT_AMBULATORY_CARE_PROVIDER_SITE_OTHER): Payer: Medicare Other | Admitting: Internal Medicine

## 2023-06-22 VITALS — BP 142/82 | HR 77 | Ht 61.0 in | Wt 161.2 lb

## 2023-06-22 DIAGNOSIS — R9389 Abnormal findings on diagnostic imaging of other specified body structures: Secondary | ICD-10-CM | POA: Diagnosis not present

## 2023-06-22 DIAGNOSIS — K219 Gastro-esophageal reflux disease without esophagitis: Secondary | ICD-10-CM | POA: Diagnosis not present

## 2023-06-22 DIAGNOSIS — J4540 Moderate persistent asthma, uncomplicated: Secondary | ICD-10-CM | POA: Diagnosis not present

## 2023-06-22 MED ORDER — FLUTICASONE-SALMETEROL 113-14 MCG/ACT IN AEPB: 2.0000 | INHALATION_SPRAY | Freq: Two times a day (BID) | RESPIRATORY_TRACT | 5 refills | Status: DC

## 2023-06-22 NOTE — Patient Instructions (Addendum)
Please schedule follow up scheduled with myself in 6 months.  If my schedule is not open yet, we will contact you with a reminder closer to that time. Please call [phone] if you haven't heard from [place] a month before.   I will see you in 6 months with a breathing test beforehand.   Continue the airduo inhaler. You can try decreasing to 2 puffs once a day, gargle after use.   I recommend starting to use the inhaler twice daily as prescribed if you have symptoms of asthma such as chest tightness, wheezing, coughing, shortness of breath. You should also start using it twice daily  if you have exposure to a sick contact, worsening allergies, or any other trigger for your asthma. I recommend you keep using it even after your respiratory symptoms resolve for 3-4 days. The goal of this therapy is to prevent your symptoms from becoming a flare severe enough to require steroids like prednisone.   Continue the albuterol inhaler as needed.   The changes on your CT Chest are most likely related to your reflux issues. We will follow this with breathing testing and consider a CT Chest if there's a change. Call us sooner if you need [place].

## 2023-06-22 NOTE — Progress Notes (Signed)
Kristy [name]    [id number]    01/07/1941  Primary Care Physician:Hernandez Priscella Mann, MD Date of Appointment: 06/22/2023 Established Patient Visit  Chief complaint:   Chief Complaint  Patient presents with   Follow-up    Pt states she has no concerns she is doing well      HPI: Kristy [name] is a 82 y.o. woman with moderate persistent asthma, GERD, and chronic CT Chest changes felt to be secondary to aspiration.    Interval Updates: Here for asthma follow up Doing well on airduo.  No prednisone use, no asthma flare.  Reflux is "okay" she is seeing GI later this year.    Current Regimen: airduo respiclick 2 puffs twice a day, 1-2 times a week prn albuterol. Asthma Triggers: allergies, URI, exertion Exacerbations in the last year: History of hospitalization or intubation: never Allergy Testing: yes negative in 2023 on blood work GERD: yes on PPI Allergic Rhinitis: yes on zyrtec as needed ACT:  Asthma Control Test ACT Total Score  06/22/2023 10:44 AM 24  01/27/2023  8:39 AM 20  12/16/2022  9:14 AM 14   FeNO:  I have reviewed the patient's family social and past medical history and updated as appropriate.   Past Medical History:  Diagnosis Date   Allergic rhinitis, seasonal    Arthritis    Asthma    bronchial with weather change   Diabetes mellitus without complication (HCC)    type 2    Dysrhythmia    "Skipping a beat"   GERD (gastroesophageal reflux disease)    History of colon polyps    Hypertension    Pneumonia    Rotator cuff tear, right     Past Surgical History:  Procedure Laterality Date   BACK SURGERY     x3  ruptured disc    CHOLECYSTECTOMY OPEN  AGE 8   EYE SURGERY     bil cataracts   HERNIA REPAIR     INGUINAL HERNIA REPAIR Right AGE 8s   IR KYPHO THORACIC WITH BONE BIOPSY  10/30/2022   IR RADIOLOGIST EVAL & MGMT  10/07/2022   KNEE ARTHROSCOPY Left 2014   LUMBAR DISC SURGERY  x2  LAST DATE EARLY 2000s   ORIF ANKLE  FRACTURE Right 06/24/2018   Procedure: OPEN REDUCTION INTERNAL FIXATION (ORIF) RIGHT ANKLE FRACTURE;  Surgeon: Kathryne Hitch, MD;  Location: [facility];  Service: Orthopedics;  Laterality: Right;   ROTATOR CUFF REPAIR Left 01/2015   TONSILLECTOMY  AGE 28   TOTAL KNEE ARTHROPLASTY Right 12/09/2020   Procedure: TOTAL KNEE ARTHROPLASTY;  Surgeon: Ollen Gross, MD;  Location: [facility];  Service: Orthopedics;  Laterality: Right;    VAGINAL HYSTERECTOMY  AGE 76   WITH BSO    Family History  Problem Relation Age of Onset   Colon cancer Neg Hx    Esophageal cancer Neg Hx    Rectal cancer Neg Hx    Stomach cancer Neg Hx    Asthma Neg Hx     Social History   Occupational History   Occupation: retired  Tobacco Use   Smoking status: Former    Current packs/day: 0.00    Types: Cigarettes    Start date: 10/19/1978    Quit date: 10/19/1988    Years since quitting: 34.6   Smokeless tobacco: Never  Vaping Use   Vaping status: Never Used  Substance and Sexual Activity   Alcohol use: Yes  Alcohol/week: 3.0 - 4.0 standard drinks of alcohol    Types: 3 - 4 Glasses of wine per week    Comment: 2-3 times a week   Drug use: Never   Sexual activity: Not Currently     Physical Exam: Blood pressure (!) 142/82, pulse 77, height 5\' 1"  (1.549 m), weight 161 lb 3.2 oz (73.1 kg), SpO2 99%.  Gen:      No acute distress ENT:  no nasal polyps, mucus membranes moist Lungs:    No increased respiratory effort, symmetric chest wall excursion, clear to auscultation bilaterally, no wheezes or crackles CV:         Regular rate and rhythm; no murmurs, rubs, or gallops.  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 Mild lower lobe predominant subpleural stranding with few areas of bronchiectasis. Could be earlier ILD. No frank honeycombing.   PFTs:     Latest Ref Rng &  Units 10/21/2022   11:05 AM  PFT Results  FVC-Pre L 1.59   FVC-Predicted Pre % 74   FVC-Post L 1.61   FVC-Predicted Post % 76   Pre FEV1/FVC % % 86   Post FEV1/FCV % % 84   FEV1-Pre L 1.36   FEV1-Predicted Pre % 87   FEV1-Post L 1.35   DLCO uncorrected ml/min/mmHg 15.50   DLCO UNC% % 92   DLCO corrected ml/min/mmHg 15.31   DLCO COR %Predicted % 91   DLVA Predicted % 138   TLC L 2.54   TLC % Predicted % 55   RV % Predicted % 30    I have personally reviewed the patient's PFTs and moderate restriction to ventilation based on Z score. Normal diffusion capacity.   Labs:  Immunization status: Immunization History  Administered Date(s) Administered   Covid-19, Mrna,Vaccine(Spikevax)3yrs and older 02/17/2023   Fluad Quad(high Dose 65+) 07/05/2019, 08/22/2020, 07/13/2022   Influenza Split 09/23/2011, 09/09/2013   Influenza Whole 10/04/2007, 08/07/2008, 09/16/2009   Influenza, High Dose Seasonal PF 10/04/2015, 08/05/2016, 09/14/2017, 09/08/2018   Influenza,inj,Quad PF,6+ Mos 08/07/2014   Influenza,inj,quad, With Preservative 02/05/2021   Influenza-Unspecified 08/05/2021   Moderna SARS-COV2 Booster Vaccination 02/20/2023   Moderna Sars-Covid-2 Vaccination 12/17/2019   PFIZER Comirnaty(Gray Top)Covid-19 Tri-Sucrose Vaccine 02/05/2021, 07/13/2022   PFIZER(Purple Top)SARS-COV-2 Vaccination 11/07/2019, 11/28/2019, 07/18/2020   PNEUMOCOCCAL CONJUGATE-20 02/05/2021, 07/13/2022   Pfizer Covid-19 Vaccine Bivalent Booster 32yrs & up 08/05/2021   Pneumococcal Conjugate-13 10/09/2013   Pneumococcal Polysaccharide-23 04/03/2016   Tetanus 10/09/2013    External Records Personally Reviewed:   Assessment:  Moderate persistent asthma Allergic Rhinits 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  Abnormal CT Chest Hypertension  Plan/Recommendations:  I will see you in 6 months with a breathing test beforehand.   Continue the airduo inhaler. You can try decreasing to 2 puffs  once a day, gargle after use.   I recommend starting to use the inhaler twice daily as prescribed if you have symptoms of asthma such as chest tightness, wheezing, coughing, shortness of breath.   Continue albuterol inhaler as needed.   Continue the prilosec medication for reflux and voice hoarseness.   The changes on your CT Chest are most likely related to your reflux issues. We will follow this with breathing testing and consider a CT Chest if there's a change. Call us sooner if you need [place].   Return to Care: Return in about 6  months (around 12/20/2023) for follow up with PFT and same day appointment.   Durel Salts, MD Pulmonary and Critical Care Medicine [facility] Office:[phone]

## 2023-06-28 DIAGNOSIS — Z23 Encounter for immunization: Secondary | ICD-10-CM | POA: Diagnosis not present

## 2023-06-30 DIAGNOSIS — M542 Cervicalgia: Secondary | ICD-10-CM | POA: Diagnosis not present

## 2023-06-30 DIAGNOSIS — M546 Pain in thoracic spine: Secondary | ICD-10-CM | POA: Diagnosis not present

## 2023-07-05 DIAGNOSIS — M546 Pain in thoracic spine: Secondary | ICD-10-CM | POA: Diagnosis not present

## 2023-07-05 DIAGNOSIS — M542 Cervicalgia: Secondary | ICD-10-CM | POA: Diagnosis not present

## 2023-07-07 DIAGNOSIS — M546 Pain in thoracic spine: Secondary | ICD-10-CM | POA: Diagnosis not present

## 2023-07-07 DIAGNOSIS — M542 Cervicalgia: Secondary | ICD-10-CM | POA: Diagnosis not present

## 2023-07-13 DIAGNOSIS — M542 Cervicalgia: Secondary | ICD-10-CM | POA: Diagnosis not present

## 2023-07-13 DIAGNOSIS — M546 Pain in thoracic spine: Secondary | ICD-10-CM | POA: Diagnosis not present

## 2023-07-15 ENCOUNTER — Ambulatory Visit: Payer: Medicare Other | Admitting: Physician Assistant

## 2023-07-16 DIAGNOSIS — M546 Pain in thoracic spine: Secondary | ICD-10-CM | POA: Diagnosis not present

## 2023-07-16 DIAGNOSIS — M542 Cervicalgia: Secondary | ICD-10-CM | POA: Diagnosis not present

## 2023-07-19 ENCOUNTER — Ambulatory Visit (INDEPENDENT_AMBULATORY_CARE_PROVIDER_SITE_OTHER): Payer: Medicare Other

## 2023-07-19 VITALS — Ht 61.0 in | Wt 161.0 lb

## 2023-07-19 DIAGNOSIS — M546 Pain in thoracic spine: Secondary | ICD-10-CM | POA: Diagnosis not present

## 2023-07-19 DIAGNOSIS — M542 Cervicalgia: Secondary | ICD-10-CM | POA: Diagnosis not present

## 2023-07-19 DIAGNOSIS — Z0000 Encounter for general adult medical examination without abnormal findings: Secondary | ICD-10-CM | POA: Diagnosis not present

## 2023-07-19 NOTE — Progress Notes (Signed)
Subjective:   Kristy [name] is a 82 y.o. female who presents for Medicare Annual (Subsequent) preventive examination.  Visit Complete: Virtual  I connected with  Kristy [name] on 07/19/23 by a audio enabled telemedicine application and verified that I am speaking with the correct person using two identifiers.  Patient Location: Home  Provider Location: Home Office  I discussed the limitations of evaluation and management by telemedicine. The patient expressed understanding and agreed to proceed.  Because this visit was a virtual/telehealth visit, some criteria may be missing or patient reported. Any vitals not documented were not able to be obtained and vitals that have been documented are patient reported.   Cardiac Risk Factors include: advanced age (>52men, >65 women);diabetes mellitus;hypertension     Objective:    Today's Vitals   07/19/23 1407  Weight: 161 lb (73 kg)  Height: 5\' 1"  (1.549 m)   Body mass index is 30.42 kg/m.     07/19/2023    2:15 PM 10/30/2022    7:16 AM 09/05/2021    5:41 PM 09/03/2021   10:39 AM 05/15/2021    2:04 PM 12/09/2020    4:12 PM 11/27/2020   10:34 AM  Advanced Directives  Does Patient Have a Medical Advance Directive? Yes Yes No No Yes No No  Type of Estate agent of [place];Living will Healthcare Power of [place];Living will   Living will;Healthcare Power of Attorney    Does patient want to make changes to medical advance directive?  No - Patient declined   No - Patient declined    Copy of Healthcare Power of Attorney in Chart? No - copy requested No - copy requested   No - copy requested    Would patient like information on creating a medical advance directive?   No - Patient declined   No - Patient declined     Current Medications (verified) Outpatient Encounter Medications as of 07/19/2023  Medication Sig   albuterol (VENTOLIN HFA) 108 (90 Base) MCG/ACT inhaler INHALE 2 PUFFS BY MOUTH EVERY 6 HOURS AS NEEDED FOR  WHEEZING   alendronate (FOSAMAX) 70 MG tablet Take 70 mg by mouth once a week. Take with a full glass of water on an empty stomach.   amLODipine (NORVASC) 10 MG tablet TAKE 1 TABLET BY MOUTH EVERY DAY   celecoxib (CELEBREX) 200 MG capsule Take 200 mg by mouth daily.   ferrous sulfate 325 (65 FE) MG tablet TAKE 1 TABLET BY MOUTH TWICE A DAY WITH FOOD   Fluticasone-Salmeterol (AIRDUO RESPICLICK 113/14) 113-14 MCG/ACT AEPB Inhale 2 puffs into the lungs in the morning and at bedtime.   hydrochlorothiazide (HYDRODIURIL) 12.5 MG tablet Take 1 tablet (12.5 mg total) by mouth daily.   losartan (COZAAR) 100 MG tablet Take 1 tablet (100 mg total) by mouth daily.   metFORMIN (GLUCOPHAGE) 500 MG tablet TAKE 1 TABLET BY MOUTH TWICE A DAY WITH FOOD   metoprolol succinate (TOPROL-XL) 50 MG 24 hr tablet TAKE 1 TABLET BY MOUTH DAILY. PATIENT NEED A APPOINTMENT FOR FUTURE REFILLS 2ND ATTEMPT   omeprazole (PRILOSEC) 20 MG capsule TAKE 1 CAPSULE (20 MG TOTAL) BY MOUTH 2 (TWO) TIMES DAILY BEFORE A MEAL.   Spacer/Aero-Holding Rudean Curt Use with Symbicort inhaler   traMADol (ULTRAM) 50 MG tablet Take by mouth every 6 (six) hours as needed.   No facility-administered encounter medications on file as of 07/19/2023.    Allergies (verified) Clonidine   History: Past Medical History:  Diagnosis Date  Allergic rhinitis, seasonal    Arthritis    Asthma    bronchial with weather change   Diabetes mellitus without complication (HCC)    type 2    Dysrhythmia    "Skipping a beat"   GERD (gastroesophageal reflux disease)    History of colon polyps    Hypertension    Pneumonia    Rotator cuff tear, right    Past Surgical History:  Procedure Laterality Date   BACK SURGERY     x3  ruptured disc    CHOLECYSTECTOMY OPEN  AGE 78   EYE SURGERY     bil cataracts   HERNIA REPAIR     INGUINAL HERNIA REPAIR Right AGE 78s   IR KYPHO THORACIC WITH BONE BIOPSY  10/30/2022   IR RADIOLOGIST EVAL & MGMT  10/07/2022    KNEE ARTHROSCOPY Left 2014   LUMBAR DISC SURGERY  x2  LAST DATE EARLY 2000s   ORIF ANKLE FRACTURE Right 06/24/2018   Procedure: OPEN REDUCTION INTERNAL FIXATION (ORIF) RIGHT ANKLE FRACTURE;  Surgeon: Kathryne Hitch, MD;  Location: [facility];  Service: Orthopedics;  Laterality: Right;   ROTATOR CUFF REPAIR Left 01/2015   TONSILLECTOMY  AGE 15   TOTAL KNEE ARTHROPLASTY Right 12/09/2020   Procedure: TOTAL KNEE ARTHROPLASTY;  Surgeon: Ollen Gross, MD;  Location: [facility];  Service: Orthopedics;  Laterality: Right;    VAGINAL HYSTERECTOMY  AGE 89   WITH BSO   Family History  Problem Relation Age of Onset   Colon cancer Neg Hx    Esophageal cancer Neg Hx    Rectal cancer Neg Hx    Stomach cancer Neg Hx    Asthma Neg Hx    Social History   Socioeconomic History   Marital status: Widowed    Spouse name: Not on file   Number of children: 4   Years of education: Not on file   Highest education level: Associate degree: academic program  Occupational History   Occupation: retired  Tobacco Use   Smoking status: Former    Current packs/day: 0.00    Types: Cigarettes    Start date: 10/19/1978    Quit date: 10/19/1988    Years since quitting: 34.7   Smokeless tobacco: Never  Vaping Use   Vaping status: Never Used  Substance and Sexual Activity   Alcohol use: Yes    Alcohol/week: 3.0 - 4.0 standard drinks of alcohol    Types: 3 - 4 Glasses of wine per week    Comment: 2-3 times a week   Drug use: Never   Sexual activity: Not Currently  Other Topics Concern   Not on file  Social History Narrative   Not on file   Social Determinants of Health   Financial Resource Strain: Low Risk  (07/19/2023)   Overall Financial Resource Strain (CARDIA)    Difficulty of Paying Living Expenses: Not hard at all  Food Insecurity: No Food Insecurity (07/19/2023)   Hunger Vital Sign    Worried About Running Out of Food in the Last Year: Never true    Ran Out of Food in the Last Year: Never true   Transportation Needs: No Transportation Needs (07/19/2023)   PRAPARE - Administrator, Civil Service (Medical): No    Lack of Transportation (Non-Medical): No  Physical Activity: Sufficiently Active (07/19/2023)   Exercise Vital Sign    Days of Exercise per Week: 3 days    Minutes of Exercise per Session: 60 min  Stress: No Stress Concern Present (07/19/2023)   [employer] of Occupational Health - Occupational Stress Questionnaire    Feeling of Stress : Not at all  Social Connections: Moderately Integrated (07/19/2023)   Social Connection and Isolation Panel [NHANES]    Frequency of Communication with Friends and Family: More than three times a week    Frequency of Social Gatherings with Friends and Family: More than three times a week    Attends Religious Services: More than 4 times per year    Active Member of [employer] or Organizations: Yes    Attends Banker Meetings: More than 4 times per year    Marital Status: Widowed    Tobacco Counseling Counseling given: Not Answered   Clinical Intake:  Pre-visit preparation completed: Yes  Pain : No/denies pain     BMI - recorded: 30.42 Nutritional Status: BMI > 30  Obese Nutritional Risks: None Diabetes: Yes CBG done?: No Did pt. bring in CBG monitor from home?: No  How often do you need to have someone help you when you read instructions, pamphlets, or other written materials from your doctor or pharmacy?: 1 - Never  Interpreter Needed?: No  Information entered by :: Theresa Mulligan LPN   Activities of Daily Living    07/19/2023    2:14 PM  In your present state of health, do you have any difficulty performing the following activities:  Hearing? 0  Vision? 0  Difficulty concentrating or making decisions? 0  Walking or climbing stairs? 0  Dressing or bathing? 0  Doing errands, shopping? 0  Preparing Food and eating ? N  Using the Toilet? N  In the past six months, have you accidently leaked  urine? N  Do you have problems with loss of bowel control? N  Managing your Medications? N  Managing your Finances? N  Housekeeping or managing your Housekeeping? N    Patient Care Team: Philip Aspen, Limmie Patricia, MD as PCP - General (Internal Medicine) Lennette Bihari, MD as PCP - Cardiology (Cardiology)  Indicate any recent Medical Services you may have received from other than [facility] providers in the past year (date may be approximate).     Assessment:   This is a routine wellness examination for [place].  Hearing/Vision screen Hearing Screening - Comments:: Denies hearing difficulties   Vision Screening - Comments:: Wears rx glasses - up to date with routine eye exams with  Dr Burgess Estelle   Goals Addressed               This Visit's Progress     Stay Active (pt-stated)        Stay alive!       Depression Screen    07/19/2023    2:13 PM 05/03/2023   10:50 AM 03/22/2023   10:11 AM 02/01/2023    9:18 AM 07/16/2022    9:25 AM 03/25/2022    4:09 PM 02/23/2022    8:11 AM  PHQ 2/9 Scores  PHQ - 2 Score 0 0  0 0  0  PHQ- 9 Score  0  2 0  4  Exception Documentation   Patient refusal   Patient refusal     Fall Risk    07/19/2023    2:14 PM 05/03/2023   10:49 AM 03/22/2023   10:11 AM 03/18/2023    8:44 AM 02/01/2023    9:17 AM  Fall Risk   Falls in the past year? 0 0 1 0 1  Number falls  in past yr: 0 0 0  0  Injury with Fall? 0 0 1  1  Risk for fall due to : No Fall Risks      Follow up Falls prevention discussed Falls evaluation completed Falls evaluation completed  Falls evaluation completed    MEDICARE RISK AT HOME: Medicare Risk at Home Any stairs in or around the home?: Yes If so, are there any without handrails?: No Home free of loose throw rugs in walkways, pet beds, electrical cords, etc?: Yes Adequate lighting in your home to reduce risk of falls?: Yes Life alert?: No Use of a cane, walker or w/c?: No Grab bars in the bathroom?: No Shower chair or bench in  shower?: No Elevated toilet seat or a handicapped toilet?: No  TIMED UP AND GO:  Was the test performed?  No    Cognitive Function:        07/19/2023    2:15 PM  6CIT Screen  What Year? 0 points  What month? 0 points  What time? 0 points  Count back from 20 0 points  Months in reverse 0 points  Repeat phrase 0 points  Total Score 0 points    Immunizations Immunization History  Administered Date(s) Administered   Fluad Quad(high Dose 65+) 07/05/2019, 08/22/2020, 07/13/2022   Influenza Split 09/23/2011, 09/09/2013   Influenza Whole 10/04/2007, 08/07/2008, 09/16/2009   Influenza, High Dose Seasonal PF 10/04/2015, 08/05/2016, 09/14/2017, 09/08/2018, 06/28/2023   Influenza,inj,Quad PF,6+ Mos 08/07/2014   Influenza,inj,quad, With Preservative 02/05/2021   Influenza-Unspecified 08/05/2021   Moderna Covid-19 Fall Seasonal Vaccine 86yrs & older 06/28/2023   Moderna SARS-COV2 Booster Vaccination 02/20/2023   Moderna Sars-Covid-2 Vaccination 12/17/2019   PFIZER Comirnaty(Gray Top)Covid-19 Tri-Sucrose Vaccine 02/05/2021, 07/13/2022   PFIZER(Purple Top)SARS-COV-2 Vaccination 11/07/2019, 11/28/2019, 07/18/2020   PNEUMOCOCCAL CONJUGATE-20 02/05/2021, 07/13/2022   Pfizer Covid-19 Vaccine Bivalent Booster 23yrs & up 08/05/2021   Pneumococcal Conjugate-13 10/09/2013   Pneumococcal Polysaccharide-23 04/03/2016   Respiratory Syncytial Virus Vaccine,Recomb Aduvanted(Arexvy) 06/28/2023   Tetanus 10/09/2013    TDAP status: Due, Education has been provided regarding the importance of this vaccine. Advised may receive this vaccine at local pharmacy or Health Dept. Aware to provide a copy of the vaccination record if obtained from local pharmacy or Health Dept. Verbalized acceptance and understanding.  Flu Vaccine status: Up to date  Pneumococcal vaccine status: Up to date  Covid-19 vaccine status: Completed vaccines  Qualifies for Shingles Vaccine? Yes   Zostavax completed No   Shingrix  Completed?: No.    Education has been provided regarding the importance of this vaccine. Patient has been advised to call insurance company to determine out of pocket expense if they have not yet received this vaccine. Advised may also receive vaccine at local pharmacy or Health Dept. Verbalized acceptance and understanding.  Screening Tests Health Maintenance  Topic Date Due   FOOT EXAM  Never done   Zoster Vaccines- Shingrix (1 of 2) Never done   DTaP/Tdap/Td (1 - Tdap) 10/10/2013   Diabetic kidney evaluation - Urine ACR  07/17/2023   Colonoscopy  08/03/2023 (Originally 03/25/2023)   OPHTHALMOLOGY EXAM  11/03/2023 (Originally 03/19/2023)   COVID-19 Vaccine (9 - 2023-24 season) 08/23/2023   Diabetic kidney evaluation - eGFR measurement  10/31/2023   HEMOGLOBIN A1C  11/03/2023   MAMMOGRAM  04/04/2024   Medicare Annual Wellness (AWV)  07/18/2024   Pneumonia Vaccine 16+ Years old  Completed   INFLUENZA VACCINE  Completed   DEXA SCAN  Completed   HPV VACCINES  Aged Out    Health Maintenance  Health Maintenance Due  Topic Date Due   FOOT EXAM  Never done   Zoster Vaccines- Shingrix (1 of 2) Never done   DTaP/Tdap/Td (1 - Tdap) 10/10/2013   Diabetic kidney evaluation - Urine ACR  07/17/2023    Colorectal cancer screening: Referral to GI placed Deferred. Pt aware the office will call re: appt.  Mammogram status: Completed 04/05/23. Repeat every year  Bone Density status: Completed 11/20/22. Results reflect: Bone density results: OSTEOPOROSIS. Repeat every   years.    Additional Screening:    Vision Screening: Recommended annual ophthalmology exams for early detection of glaucoma and other disorders of the eye. Is the patient up to date with their annual eye exam?  Yes  Who is the provider or what is the name of the office in which the patient attends annual eye exams? Dr Burgess Estelle If pt is not established with a provider, would they like to be referred to a provider to establish care?  No .   Dental Screening: Recommended annual dental exams for proper oral hygiene  Diabetic Foot Exam: Diabetic Foot Exam: Overdue, Pt has been advised about the importance in completing this exam. Pt is scheduled for diabetic foot exam on Followed by PCP.  Community Resource Referral / Chronic Care Management:  CRR required this visit?  No   CCM required this visit?  No     Plan:     I have personally reviewed and noted the following in the patient's chart:   Medical and social history Use of alcohol, tobacco or illicit drugs  Current medications and supplements including opioid prescriptions. Patient is currently taking opioid prescriptions. Information provided to patient regarding non-opioid alternatives. Patient advised to discuss non-opioid treatment plan with their provider. Functional ability and status Nutritional status Physical activity Advanced directives List of other physicians Hospitalizations, surgeries, and ER visits in previous 12 months Vitals Screenings to include cognitive, depression, and falls Referrals and appointments  In addition, I have reviewed and discussed with patient certain preventive protocols, quality metrics, and best practice recommendations. A written personalized care plan for preventive services as well as general preventive health recommendations were provided to patient.     Tillie Rung, LPN   [id number]/[id number]   After Visit Summary: (MyChart) Due to this being a telephonic visit, the after visit summary with patients personalized plan was offered to patient via MyChart   Nurse Notes: None

## 2023-07-19 NOTE — Patient Instructions (Addendum)
[name] , Thank you for taking time to come for your Medicare Wellness Visit. I appreciate your ongoing commitment to your health goals. Please review the following plan we discussed and let me know if I can assist you in the future.   Referrals/Orders/Follow-Ups/Clinician Recommendations:   This is a list of the screening recommended for you and due dates:  Health Maintenance  Topic Date Due   Complete foot exam   Never done   Zoster (Shingles) Vaccine (1 of 2) Never done   DTaP/Tdap/Td vaccine (1 - Tdap) 10/10/2013   Yearly kidney health urinalysis for diabetes  07/17/2023   Colon Cancer Screening  08/03/2023*   Eye exam for diabetics  11/03/2023*   COVID-19 Vaccine (9 - 2023-24 season) 08/23/2023   Yearly kidney function blood test for diabetes  10/31/2023   Hemoglobin A1C  11/03/2023   Mammogram  04/04/2024   Medicare Annual Wellness Visit  07/18/2024   Pneumonia Vaccine  Completed   Flu Shot  Completed   DEXA scan (bone density measurement)  Completed   HPV Vaccine  Aged Out  *Topic was postponed. The date shown is not the original due date.    Advanced directives: (Copy Requested) Please bring a copy of your health care power of attorney and living will to the office to be added to your chart at your convenience.  Next Medicare Annual Wellness Visit scheduled for next year: Yes

## 2023-07-20 ENCOUNTER — Encounter: Payer: Self-pay | Admitting: Gastroenterology

## 2023-07-20 ENCOUNTER — Ambulatory Visit (INDEPENDENT_AMBULATORY_CARE_PROVIDER_SITE_OTHER): Payer: Medicare Other | Admitting: Gastroenterology

## 2023-07-20 VITALS — BP 110/80 | HR 80 | Ht 61.0 in | Wt 159.5 lb

## 2023-07-20 DIAGNOSIS — D509 Iron deficiency anemia, unspecified: Secondary | ICD-10-CM

## 2023-07-20 DIAGNOSIS — Z860100 Personal history of colon polyps, unspecified: Secondary | ICD-10-CM

## 2023-07-20 DIAGNOSIS — K219 Gastro-esophageal reflux disease without esophagitis: Secondary | ICD-10-CM | POA: Diagnosis not present

## 2023-07-20 DIAGNOSIS — R195 Other fecal abnormalities: Secondary | ICD-10-CM

## 2023-07-20 NOTE — Progress Notes (Signed)
Chief Complaint: repeat colonoscopy Primary GI MD:  Dr. 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 Marina Goodell  At that time she was doing well and taking her iron supplement once daily.  Her GERD was well-controlled on omeprazole 20 Mg twice daily.  Recent lab work 10/30/2022 Hgb 12.6 BMP normal  Patient states she is here today referred by her PCP for repeat colonoscopy.  She has no issues overall.  She does state when she takes omeprazole 20 Mg twice daily she will have severe diarrhea.  If she takes it once daily she has no issues with diarrhea.  Once daily does control her GERD adequately.  She states she does not sleep well at night she is typically up from 2 AM to 6 AM due to frequent urination.  States she often drinks 33 ounces of water throughout the night because she has dry mouth.  PREVIOUS GI WORKUP   EGD 05/29/2022 for IDA, dysphagia, melena - 12 mm subepithelial lesion proximal esophagus - Esophageal webs and distal diverticulum - Distal stricture at GE junction: Dilated - Large hiatal hernia with Sheria Lang erosions - Otherwise normal EGD  Colonoscopy 03/24/2018 for history of polyps - Diverticulosis in sigmoid colon - Internal hemorrhoids - No specimens collected - Repeat colonoscopy not recommended for surveillance  Previous colonoscopies 2006 and 2012  Past Medical History:  Diagnosis Date   Allergic rhinitis, seasonal    Arthritis    Asthma    bronchial with weather change   Diabetes mellitus without complication (HCC)    type 2    Dysrhythmia    "Skipping a beat"   GERD (gastroesophageal reflux disease)    History of colon polyps    Hypertension    Pneumonia    Rotator cuff tear, right     Past Surgical History:  Procedure Laterality Date   BACK SURGERY     x3  ruptured  disc    CHOLECYSTECTOMY OPEN  AGE 89   EYE SURGERY     bil cataracts   HERNIA REPAIR     INGUINAL HERNIA REPAIR Right AGE 89s   IR KYPHO THORACIC WITH BONE BIOPSY  10/30/2022   IR RADIOLOGIST EVAL & MGMT  10/07/2022   KNEE ARTHROSCOPY Left 2014   LUMBAR DISC SURGERY  x2  LAST DATE EARLY 2000s   ORIF ANKLE FRACTURE Right 06/24/2018   Procedure: OPEN REDUCTION INTERNAL FIXATION (ORIF) RIGHT ANKLE FRACTURE;  Surgeon: Kathryne Hitch, MD;  Location: [facility];  Service: Orthopedics;  Laterality: Right;   ROTATOR CUFF REPAIR Left 01/2015   TONSILLECTOMY  AGE 56   TOTAL KNEE ARTHROPLASTY Right 12/09/2020   Procedure: TOTAL KNEE ARTHROPLASTY;  Surgeon: Ollen Gross, MD;  Location: [facility];  Service: Orthopedics;  Laterality: Right;    VAGINAL HYSTERECTOMY  AGE 63   WITH BSO    Current Outpatient Medications  Medication Sig Dispense Refill   albuterol (VENTOLIN HFA) 108 (90 Base) MCG/ACT inhaler INHALE 2 PUFFS BY MOUTH EVERY 6 HOURS AS NEEDED FOR WHEEZING 6.7 each 1   alendronate (FOSAMAX) 70 MG tablet Take 70 mg by mouth once a week. Take with a full glass of water on an empty stomach.     amLODipine (NORVASC) 10 MG tablet TAKE 1 TABLET BY MOUTH EVERY DAY 90 tablet 0   celecoxib (CELEBREX) 200  MG capsule Take 200 mg by mouth daily.     ferrous sulfate 325 (65 FE) MG tablet TAKE 1 TABLET BY MOUTH TWICE A DAY WITH FOOD 100 tablet 2   Fluticasone-Salmeterol (AIRDUO RESPICLICK 113/14) 113-14 MCG/ACT AEPB Inhale 2 puffs into the lungs in the morning and at bedtime. 1 each 5   hydrochlorothiazide (HYDRODIURIL) 12.5 MG tablet Take 1 tablet (12.5 mg total) by mouth daily. 90 tablet 3   losartan (COZAAR) 100 MG tablet Take 1 tablet (100 mg total) by mouth daily. 90 tablet 1   metFORMIN (GLUCOPHAGE) 500 MG tablet TAKE 1 TABLET BY MOUTH TWICE A DAY WITH FOOD 180 tablet 1   metoprolol succinate (TOPROL-XL) 50 MG 24 hr tablet TAKE 1 TABLET BY MOUTH DAILY. PATIENT NEED A APPOINTMENT FOR FUTURE  REFILLS 2ND ATTEMPT 90 tablet 1   omeprazole (PRILOSEC) 20 MG capsule TAKE 1 CAPSULE (20 MG TOTAL) BY MOUTH 2 (TWO) TIMES DAILY BEFORE A MEAL. 180 capsule 1   Spacer/Aero-Holding Chambers DEVI Use with Symbicort inhaler 1 each 2   traMADol (ULTRAM) 50 MG tablet Take by mouth every 6 (six) hours as needed.     No current facility-administered medications for this visit.    Allergies as of 07/20/2023 - Review Complete 07/20/2023  Allergen Reaction Noted   Clonidine Hives 04/07/2007    Family History  Problem Relation Age of Onset   Colon cancer Neg Hx    Esophageal cancer Neg Hx    Rectal cancer Neg Hx    Stomach cancer Neg Hx    Asthma Neg Hx     Social History   Socioeconomic History   Marital status: Widowed    Spouse name: Not on file   Number of children: 4   Years of education: Not on file   Highest education level: Associate degree: academic program  Occupational History   Occupation: retired  Tobacco Use   Smoking status: Former    Current packs/day: 0.00    Types: Cigarettes    Start date: 10/19/1978    Quit date: 10/19/1988    Years since quitting: 34.7   Smokeless tobacco: Never  Vaping Use   Vaping status: Never Used  Substance and Sexual Activity   Alcohol use: Yes    Alcohol/week: 3.0 - 4.0 standard drinks of alcohol    Types: 3 - 4 Glasses of wine per week    Comment: 2-3 times a week   Drug use: Never   Sexual activity: Not Currently  Other Topics Concern   Not on file  Social History Narrative   Not on file   Social Determinants of Health   Financial Resource Strain: Low Risk  (07/19/2023)   Overall Financial Resource Strain (CARDIA)    Difficulty of Paying Living Expenses: Not hard at all  Food Insecurity: No Food Insecurity (07/19/2023)   Hunger Vital Sign    Worried About Running Out of Food in the Last Year: Never true    Ran Out of Food in the Last Year: Never true  Transportation Needs: No Transportation Needs (07/19/2023)   PRAPARE -  Administrator, Civil Service (Medical): No    Lack of Transportation (Non-Medical): No  Physical Activity: Sufficiently Active (07/19/2023)   Exercise Vital Sign    Days of Exercise per Week: 3 days    Minutes of Exercise per Session: 60 min  Stress: No Stress Concern Present (07/19/2023)   [employer] of Occupational Health - Occupational Stress Questionnaire  Feeling of Stress : Not at all  Social Connections: Moderately Integrated (07/19/2023)   Social Connection and Isolation Panel [NHANES]    Frequency of Communication with Friends and Family: More than three times a week    Frequency of Social Gatherings with Friends and Family: More than three times a week    Attends Religious Services: More than 4 times per year    Active Member of [employer] or Organizations: Yes    Attends Banker Meetings: More than 4 times per year    Marital Status: Widowed  Intimate Partner Violence: Not At Risk (07/19/2023)   Humiliation, Afraid, Rape, and Kick questionnaire    Fear of Current or Ex-Partner: No    Emotionally Abused: No    Physically Abused: No    Sexually Abused: No    Review of Systems:    Constitutional: No weight loss, fever, chills, weakness or fatigue HEENT: Eyes: No change in vision               Ears, Nose, Throat:  No change in hearing or congestion Skin: No rash or itching Cardiovascular: No chest pain, chest pressure or palpitations   Respiratory: No SOB or cough Gastrointestinal: See HPI and otherwise negative Genitourinary: No dysuria or change in urinary frequency Neurological: No headache, dizziness or syncope Musculoskeletal: No new muscle or joint pain Hematologic: No bleeding or bruising Psychiatric: No history of depression or anxiety    Physical Exam:  Vital signs: Ht 5\' 1"  (1.549 m)   Wt 72.3 kg   BMI 30.14 kg/m   Constitutional: NAD, Well developed, Well nourished, alert and cooperative.  Appears significantly younger than  stated age head:  Normocephalic and atraumatic. Eyes:   PEERL, EOMI. No icterus. Conjunctiva pink. Respiratory: Respirations even and unlabored. Lungs clear to auscultation bilaterally.   No wheezes, crackles, or rhonchi.  Cardiovascular:  Regular rate and rhythm. No peripheral edema, cyanosis or pallor.  Rectal:  Not performed.  Msk:  Symmetrical without gross deformities. Without edema, no deformity or joint abnormality.  Neurologic:  Alert and  oriented x4;  grossly normal neurologically.  Skin:   Dry and intact without significant lesions or rashes. Psychiatric: Oriented to person, place and time. Demonstrates good judgement and reason without abnormal affect or behaviors.   RELEVANT LABS AND IMAGING: CBC    Component Value Date/Time   WBC 7.6 10/30/2022 0711   RBC 4.12 10/30/2022 0711   HGB 12.6 10/30/2022 0711   HCT 38.5 10/30/2022 0711   PLT 327 10/30/2022 0711   MCV 93.4 10/30/2022 0711   MCH 30.6 10/30/2022 0711   MCHC 32.7 10/30/2022 0711   RDW 13.0 10/30/2022 0711   LYMPHSABS 2.5 10/06/2022 1206   MONOABS 0.6 10/06/2022 1206   EOSABS 0.2 10/06/2022 1206   BASOSABS 0.1 10/06/2022 1206    CMP     Component Value Date/Time   NA 137 10/30/2022 0711   NA 139 03/13/2020 1458   K 3.5 10/30/2022 0711   CL 98 10/30/2022 0711   CO2 30 10/30/2022 0711   GLUCOSE 111 (H) 10/30/2022 0711   GLUCOSE 82 09/30/2006 0943   BUN 11 10/30/2022 0711   BUN 17 03/13/2020 1458   CREATININE 0.73 10/30/2022 0711   CALCIUM 9.6 10/30/2022 0711   PROT 7.7 07/16/2022 0938   ALBUMIN 4.4 07/16/2022 0938   AST 15 07/16/2022 0938   ALT 19 07/16/2022 0938   ALKPHOS 77 07/16/2022 0938   BILITOT 0.3 07/16/2022 0938   GFRNONAA >  60 10/30/2022 0711   GFRAA >60 03/15/2020 1141     Assessment/Plan:   82 year old female 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  IDA Well-controlled on iron supplementation.  Recent Hgb 12.6.  No overt bleeding. - Continue iron supplementation - Let us know if any overt bleeding or drop in hemoglobin  Loose stools Only when taking omeprazole 20 Mg twice daily.  Not an issue if she only takes it once daily - Recommend decreasing to omeprazole 20 Mg once daily - If breakthrough GERD on once daily please let us know  History of colon polyps Last colonoscopy in 2019 with no polyps and no repeat recommended - No need for repeat colonoscopy at this time  Don[facility] 07/20/2023, 9:36 AM  Cc: Philip Aspen, [place]*

## 2023-07-20 NOTE — Patient Instructions (Signed)
_______________________________________________________  If your blood pressure at your visit was 140/90 or greater, please contact your primary care physician to follow up on this.  _______________________________________________________  If you are age 84 or older, your body mass index should be between 23-30. Your Body mass index is 30.14 kg/m. If this is out of the aforementioned range listed, please consider follow up with your Primary Care Provider.  If you are age 70 or younger, your body mass index should be between 19-25. Your Body mass index is 30.14 kg/m. If this is out of the aformentioned range listed, please consider follow up with your Primary Care Provider.   ________________________________________________________  The [facility] providers would like to encourage you to use [facility] to communicate with providers for non-urgent requests or questions.  Due to long hold times on the telephone, sending your provider a message by [facility] may be a faster and more efficient way to get a response.  Please allow 48 business hours for a response.  Please remember that this is for non-urgent requests.  _______________________________________________________ It was a pleasure to see you today!  Thank you for trusting me with your gastrointestinal care!

## 2023-07-20 NOTE — Progress Notes (Signed)
Agree with chronic iron supplementation. Agree that she does not need any endoscopic evaluations at present. Thanks, Dr. Marina Goodell

## 2023-07-21 DIAGNOSIS — M546 Pain in thoracic spine: Secondary | ICD-10-CM | POA: Diagnosis not present

## 2023-07-21 DIAGNOSIS — M542 Cervicalgia: Secondary | ICD-10-CM | POA: Diagnosis not present

## 2023-07-22 ENCOUNTER — Other Ambulatory Visit: Payer: Self-pay | Admitting: Cardiovascular Disease

## 2023-07-26 DIAGNOSIS — M546 Pain in thoracic spine: Secondary | ICD-10-CM | POA: Diagnosis not present

## 2023-07-26 DIAGNOSIS — M542 Cervicalgia: Secondary | ICD-10-CM | POA: Diagnosis not present

## 2023-07-28 DIAGNOSIS — M542 Cervicalgia: Secondary | ICD-10-CM | POA: Diagnosis not present

## 2023-07-28 DIAGNOSIS — M546 Pain in thoracic spine: Secondary | ICD-10-CM | POA: Diagnosis not present

## 2023-08-04 DIAGNOSIS — M542 Cervicalgia: Secondary | ICD-10-CM | POA: Diagnosis not present

## 2023-08-04 DIAGNOSIS — M546 Pain in thoracic spine: Secondary | ICD-10-CM | POA: Diagnosis not present

## 2023-08-06 DIAGNOSIS — M546 Pain in thoracic spine: Secondary | ICD-10-CM | POA: Diagnosis not present

## 2023-08-06 DIAGNOSIS — M542 Cervicalgia: Secondary | ICD-10-CM | POA: Diagnosis not present

## 2023-08-11 DIAGNOSIS — M546 Pain in thoracic spine: Secondary | ICD-10-CM | POA: Diagnosis not present

## 2023-08-11 DIAGNOSIS — M542 Cervicalgia: Secondary | ICD-10-CM | POA: Diagnosis not present

## 2023-08-13 DIAGNOSIS — M546 Pain in thoracic spine: Secondary | ICD-10-CM | POA: Diagnosis not present

## 2023-08-13 DIAGNOSIS — M542 Cervicalgia: Secondary | ICD-10-CM | POA: Diagnosis not present

## 2023-08-17 ENCOUNTER — Other Ambulatory Visit: Payer: Self-pay | Admitting: Cardiovascular Disease

## 2023-08-18 DIAGNOSIS — M546 Pain in thoracic spine: Secondary | ICD-10-CM | POA: Diagnosis not present

## 2023-08-18 DIAGNOSIS — M542 Cervicalgia: Secondary | ICD-10-CM | POA: Diagnosis not present

## 2023-08-20 DIAGNOSIS — M542 Cervicalgia: Secondary | ICD-10-CM | POA: Diagnosis not present

## 2023-08-20 DIAGNOSIS — M546 Pain in thoracic spine: Secondary | ICD-10-CM | POA: Diagnosis not present

## 2023-08-23 DIAGNOSIS — M542 Cervicalgia: Secondary | ICD-10-CM | POA: Diagnosis not present

## 2023-08-23 DIAGNOSIS — M546 Pain in thoracic spine: Secondary | ICD-10-CM | POA: Diagnosis not present

## 2023-08-25 DIAGNOSIS — M546 Pain in thoracic spine: Secondary | ICD-10-CM | POA: Diagnosis not present

## 2023-08-25 DIAGNOSIS — M542 Cervicalgia: Secondary | ICD-10-CM | POA: Diagnosis not present

## 2023-08-30 DIAGNOSIS — M546 Pain in thoracic spine: Secondary | ICD-10-CM | POA: Diagnosis not present

## 2023-08-30 DIAGNOSIS — M545 Low back pain, unspecified: Secondary | ICD-10-CM | POA: Diagnosis not present

## 2023-09-02 ENCOUNTER — Encounter: Payer: Self-pay | Admitting: Internal Medicine

## 2023-09-02 ENCOUNTER — Ambulatory Visit: Payer: Medicare Other | Admitting: Internal Medicine

## 2023-09-02 VITALS — BP 130/80 | HR 67 | Temp 97.9°F | Wt 165.0 lb

## 2023-09-02 DIAGNOSIS — I10 Essential (primary) hypertension: Secondary | ICD-10-CM

## 2023-09-02 DIAGNOSIS — E119 Type 2 diabetes mellitus without complications: Secondary | ICD-10-CM

## 2023-09-02 DIAGNOSIS — J849 Interstitial pulmonary disease, unspecified: Secondary | ICD-10-CM

## 2023-09-02 LAB — POCT GLYCOSYLATED HEMOGLOBIN (HGB A1C): Hemoglobin A1C: 5.8 % — AB (ref 4.0–5.6)

## 2023-09-02 NOTE — Progress Notes (Signed)
Established Patient Office Visit     CC/Reason for Visit: Follow-up chronic conditions  HPI: Kristy [name] is a 82 y.o. female who is coming in today for the above mentioned reasons. Past Medical History is significant for: Hypertension, type 2 diabetes, interstitial lung disease.  Feeling well without acute concerns or complaints.   Past Medical/Surgical History: Past Medical History:  Diagnosis Date   Allergic rhinitis, seasonal    Arthritis    Asthma    bronchial with weather change   Diabetes mellitus without complication (HCC)    type 2    Dysrhythmia    "Skipping a beat"   GERD (gastroesophageal reflux disease)    History of colon polyps    Hypertension    Pneumonia    Rotator cuff tear, right     Past Surgical History:  Procedure Laterality Date   BACK SURGERY     x3  ruptured disc    CHOLECYSTECTOMY OPEN  AGE 40   EYE SURGERY     bil cataracts   HERNIA REPAIR     INGUINAL HERNIA REPAIR Right AGE 40s   IR KYPHO THORACIC WITH BONE BIOPSY  10/30/2022   IR RADIOLOGIST EVAL & MGMT  10/07/2022   KNEE ARTHROSCOPY Left 2014   LUMBAR DISC SURGERY  x2  LAST DATE EARLY 2000s   ORIF ANKLE FRACTURE Right 06/24/2018   Procedure: OPEN REDUCTION INTERNAL FIXATION (ORIF) RIGHT ANKLE FRACTURE;  Surgeon: Kathryne Hitch, MD;  Location: [facility];  Service: Orthopedics;  Laterality: Right;   ROTATOR CUFF REPAIR Left 01/2015   TONSILLECTOMY  AGE 28   TOTAL KNEE ARTHROPLASTY Right 12/09/2020   Procedure: TOTAL KNEE ARTHROPLASTY;  Surgeon: Ollen Gross, MD;  Location: [facility];  Service: Orthopedics;  Laterality: Right;    VAGINAL HYSTERECTOMY  AGE 58   WITH BSO    Social History:  reports that she quit smoking about 34 years ago. Her smoking use included cigarettes. She started smoking about 44 years ago. She has never used smokeless tobacco. She reports current alcohol use of about 3.0 - 4.0 standard drinks of alcohol per week. She reports that she does not use  drugs.  Allergies: Allergies  Allergen Reactions   Clonidine Hives    Family History:  Family History  Problem Relation Age of Onset   Colon cancer Neg Hx    Esophageal cancer Neg Hx    Rectal cancer Neg Hx    Stomach cancer Neg Hx    Asthma Neg Hx      Current Outpatient Medications:    albuterol (VENTOLIN HFA) 108 (90 Base) MCG/ACT inhaler, INHALE 2 PUFFS BY MOUTH EVERY 6 HOURS AS NEEDED FOR WHEEZING, Disp: 6.7 each, Rfl: 1   alendronate (FOSAMAX) 70 MG tablet, Take 70 mg by mouth once a week. Take with a full glass of water on an empty stomach., Disp: , Rfl:    amLODipine (NORVASC) 10 MG tablet, TAKE 1 TABLET BY MOUTH EVERY DAY, Disp: 90 tablet, Rfl: 0   celecoxib (CELEBREX) 200 MG capsule, Take 200 mg by mouth daily., Disp: , Rfl:    ferrous sulfate 325 (65 FE) MG tablet, TAKE 1 TABLET BY MOUTH TWICE A DAY WITH FOOD, Disp: 100 tablet, Rfl: 2   Fluticasone-Salmeterol (AIRDUO RESPICLICK 113/14) 113-14 MCG/ACT AEPB, Inhale 2 puffs into the lungs in the morning and at bedtime., Disp: 1 each, Rfl: 5   hydrochlorothiazide (HYDRODIURIL) 12.5 MG tablet, Take 1 tablet (12.5 mg total) by  mouth daily., Disp: 90 tablet, Rfl: 3   losartan (COZAAR) 100 MG tablet, Take 1 tablet (100 mg total) by mouth daily., Disp: 90 tablet, Rfl: 1   metFORMIN (GLUCOPHAGE) 500 MG tablet, TAKE 1 TABLET BY MOUTH TWICE A DAY WITH FOOD, Disp: 180 tablet, Rfl: 1   metoprolol succinate (TOPROL-XL) 50 MG 24 hr tablet, TAKE 1 TABLET BY MOUTH EVERY DAY, Disp: 90 tablet, Rfl: 3   omeprazole (PRILOSEC) 20 MG capsule, TAKE 1 CAPSULE (20 MG TOTAL) BY MOUTH 2 (TWO) TIMES DAILY BEFORE A MEAL., Disp: 180 capsule, Rfl: 1   Spacer/Aero-Holding Chambers DEVI, Use with Symbicort inhaler, Disp: 1 each, Rfl: 2   traMADol (ULTRAM) 50 MG tablet, Take by mouth every 6 (six) hours as needed., Disp: , Rfl:   Review of Systems:  Negative unless indicated in HPI.   Physical Exam: Vitals:   09/02/23 0832  BP: 130/80  Pulse: 67   Temp: 97.9 F (36.6 C)  TempSrc: Oral  SpO2: 100%  Weight: 165 lb (74.8 kg)    Body mass index is 31.18 kg/m.   Physical Exam Vitals reviewed.  Constitutional:      Appearance: Normal appearance.  HENT:     Head: Normocephalic and atraumatic.  Eyes:     Conjunctiva/sclera: Conjunctivae normal.     Pupils: Pupils are equal, round, and reactive to light.  Cardiovascular:     Rate and Rhythm: Normal rate and regular rhythm.  Pulmonary:     Effort: Pulmonary effort is normal.     Breath sounds: Normal breath sounds.  Skin:    General: Skin is warm and dry.  Neurological:     General: No focal deficit present.     Mental Status: She is alert and oriented to person, place, and time.  Psychiatric:        Mood and Affect: Mood normal.        Behavior: Behavior normal.        Thought Content: Thought content normal.        Judgment: Judgment normal.      Impression and Plan:  Type 2 diabetes mellitus without complication, without long-term current use of insulin (HCC) -     POCT glycosylated hemoglobin (Hb A1C)  Essential hypertension  ILD (interstitial lung disease) (HCC)   -Blood pressure fairly well-controlled on current. -A1c of 5.8 demonstrates excellent diabetic control. -She will obtain shingles vaccine at pharmacy, flu, COVID, RSV are up-to-date.  Time spent:32 minutes reviewing chart, interviewing and examining patient and formulating plan of care.     Chaya Jan, MD [facility]

## 2023-09-22 ENCOUNTER — Other Ambulatory Visit: Payer: Self-pay | Admitting: Internal Medicine

## 2023-09-22 DIAGNOSIS — E1159 Type 2 diabetes mellitus with other circulatory complications: Secondary | ICD-10-CM

## 2023-10-07 ENCOUNTER — Other Ambulatory Visit: Payer: Self-pay | Admitting: Internal Medicine

## 2023-10-07 DIAGNOSIS — E119 Type 2 diabetes mellitus without complications: Secondary | ICD-10-CM

## 2023-10-21 DIAGNOSIS — M25562 Pain in left knee: Secondary | ICD-10-CM | POA: Diagnosis not present

## 2023-10-21 DIAGNOSIS — Z96651 Presence of right artificial knee joint: Secondary | ICD-10-CM | POA: Diagnosis not present

## 2023-10-24 ENCOUNTER — Other Ambulatory Visit: Payer: Self-pay | Admitting: Internal Medicine

## 2023-10-24 DIAGNOSIS — D649 Anemia, unspecified: Secondary | ICD-10-CM

## 2023-10-26 DIAGNOSIS — S22000A Wedge compression fracture of unspecified thoracic vertebra, initial encounter for closed fracture: Secondary | ICD-10-CM | POA: Diagnosis not present

## 2023-10-26 DIAGNOSIS — E669 Obesity, unspecified: Secondary | ICD-10-CM | POA: Diagnosis not present

## 2023-10-26 DIAGNOSIS — Z6830 Body mass index (BMI) 30.0-30.9, adult: Secondary | ICD-10-CM | POA: Diagnosis not present

## 2023-10-26 DIAGNOSIS — Z79899 Other long term (current) drug therapy: Secondary | ICD-10-CM | POA: Diagnosis not present

## 2023-10-26 DIAGNOSIS — M810 Age-related osteoporosis without current pathological fracture: Secondary | ICD-10-CM | POA: Diagnosis not present

## 2023-10-26 DIAGNOSIS — M545 Low back pain, unspecified: Secondary | ICD-10-CM | POA: Diagnosis not present

## 2023-10-26 DIAGNOSIS — M154 Erosive (osteo)arthritis: Secondary | ICD-10-CM | POA: Diagnosis not present

## 2023-11-23 ENCOUNTER — Other Ambulatory Visit: Payer: Self-pay | Admitting: Internal Medicine

## 2023-11-23 DIAGNOSIS — K219 Gastro-esophageal reflux disease without esophagitis: Secondary | ICD-10-CM

## 2023-12-01 ENCOUNTER — Encounter: Payer: Self-pay | Admitting: Cardiovascular Disease

## 2023-12-01 ENCOUNTER — Ambulatory Visit: Payer: Medicare Other | Attending: Cardiovascular Disease | Admitting: Cardiovascular Disease

## 2023-12-01 VITALS — BP 124/78 | HR 77 | Ht 61.0 in | Wt 163.2 lb

## 2023-12-01 DIAGNOSIS — I340 Nonrheumatic mitral (valve) insufficiency: Secondary | ICD-10-CM | POA: Diagnosis not present

## 2023-12-01 DIAGNOSIS — I351 Nonrheumatic aortic (valve) insufficiency: Secondary | ICD-10-CM | POA: Insufficient documentation

## 2023-12-01 DIAGNOSIS — E119 Type 2 diabetes mellitus without complications: Secondary | ICD-10-CM | POA: Insufficient documentation

## 2023-12-01 DIAGNOSIS — I82511 Chronic embolism and thrombosis of right femoral vein: Secondary | ICD-10-CM | POA: Insufficient documentation

## 2023-12-01 DIAGNOSIS — Z9889 Other specified postprocedural states: Secondary | ICD-10-CM | POA: Insufficient documentation

## 2023-12-01 DIAGNOSIS — I5189 Other ill-defined heart diseases: Secondary | ICD-10-CM | POA: Diagnosis not present

## 2023-12-01 DIAGNOSIS — I10 Essential (primary) hypertension: Secondary | ICD-10-CM | POA: Insufficient documentation

## 2023-12-01 LAB — LIPID PANEL
Chol/HDL Ratio: 2.9 {ratio} (ref 0.0–4.4)
Cholesterol, Total: 241 mg/dL — ABNORMAL HIGH (ref 100–199)
HDL: 84 mg/dL (ref >39)
LDL Chol Calc (NIH): 136 mg/dL — ABNORMAL HIGH (ref 0–99)
Triglycerides: 121 mg/dL (ref 0–149)
VLDL Cholesterol Cal: 21 mg/dL (ref 5–40)

## 2023-12-01 LAB — TSH: TSH: 3.64 u[IU]/mL (ref 0.450–4.500)

## 2023-12-01 NOTE — Patient Instructions (Addendum)
Medication Instructions:  No medication changes were made during today's visit  *If you need a refill on your cardiac medications before your next appointment, please call your pharmacy*   Lab Work: FASTING LABS WILL BE DRAWN TODAY. If you have labs (blood work) drawn today and your tests are completely normal, you will receive your results only by: MyChart Message (if you have MyChart) OR A paper copy in the mail If you have any lab test that is abnormal or we need to change your treatment, we will call you to review the results.   Testing/Procedures: No procedures ordered today.    Follow-Up: At [facility], you and your health needs are our priority.  As part of our continuing mission to provide you with exceptional heart care, we have created designated Provider Care Teams.  These Care Teams include your primary Cardiologist (physician) and Advanced Practice Providers (APPs -  Physician Assistants and Nurse Practitioners) who all work together to provide you with the care you need, when you need it.  We recommend signing up for the patient portal called "MyChart".  Sign up information is provided on this After Visit Summary.  MyChart is used to connect with patients for Virtual Visits (Telemedicine).  Patients are able to view lab/test results, encounter notes, upcoming appointments, etc.  Non-urgent messages can be sent to your provider as well.   To learn more about what you can do with MyChart, go to [email or web address].    Your next appointment:   1 year(s)  Provider:   Chilton Si, MD    Other Instructions Thank you for choosing [facility]!   A letter will be mailed to you as a reminder to call the office for your follow up appointment.

## 2023-12-01 NOTE — Progress Notes (Unsigned)
 Cardiology Office Note    Date:  12/03/2023   ID:  MEIRA WAHBA, DOB 1940-12-09, MRN [id number]  PCP:  Philip Aspen, Limmie Patricia, MD  Cardiologist:  Nicki Guadalajara, MD   12 F/U cardiology evaluation initially referred through the courtesy of Dr. Philip Aspen   History of Present Illness:  Kristy [name] is a 83 y.o. female who is followed by Dr. Philip Aspen for primary care.  She had previously undergone right knee arthroscopy and apparently is scheduled to undergo right total knee replacement electively on June 7.  She has had significant difficulty with walking and has been using a cane.  Previously in the past she had enjoyed Zumba as well as bowling.  She was seen yesterday by Dr. Philip Aspen for preoperative clearance.  During her evaluation she was noticed to have frequent skipping beats and palpitations.  An ECG showed sinus rhythm with frequent PACs with a ventricular rate around 85 without acute ST-T changes.  She has a history of essential hypertension.  She was worked into my schedule today to be seen for preoperative cardiology clearance prior to her elective surgery.  A 2D echo Doppler study had been scheduled.  I saw the patient for initial cardiology evaluation on Mar 07, 2020.  At that time, she denied any chest pain or chest tightness and denied any significant shortness of breath.  She has a history of hypertension for greater than 15 years and was on a regimen consisting of amlodipine 10 mg, benazepril 40 mg and was on hydrochlorothiazide 25 mg daily.   Laboratory drawn the day before her evaluation with me had shown a hemoglobin A1c at 6.6 and Dr. Philip Aspen had called in a prescription for metformin to initiate 500 mg twice a day effective today.  In addition she was found to be hypokalemic with a potassium of 3.1 and supplemental potassium 40 mEq daily for 3 days was ordered.  She was unaware of any  heart rate irregularity.  I reviewed the EKG from Dr.  Wynetta Emery evaluation which showed every fourth beat being a PAC.  Ventricular rate was 85 bpm.  She denies PND orthopnea.  She denies presyncope or syncope.  She is unaware of any significant cardiac murmur.  During my evaluation, her blood pressure was slightly elevated at 144/70 and her ECG showed atrial trigeminy.  At that time I recommended she decrease HCTZ to 12.5 mg and started her on metoprolol succinate 25 mg daily with plan titration to 50 mg.  She underwent an echo Doppler study on Mar 12, 2020.  This revealed reduced LV function with an EF of 40 to 45% in a global hypokinetic pattern.  Left-ventricular internal cavity size is mildly dilated.  There is grade 1 diastolic dysfunction.  She had mildly elevated pulmonic artery systolic pressure.  There was mild left atrial dilatation.  There is evidence for a tricuspid aortic valve with mild aortic insufficiency.  There is mild mitral regurgitation.  She underwent follow-up laboratory which revealed improvement in her hypokalemia with potassium at 3.9.  With her reduced LV function, a Lexiscan Myoview study was recommended which was done on March 26, 2020 and was low risk.  She was seen on April 17, 2020 by Joni Reining, DNP and at that time, her blood pressure was improved.  At that time, her knee discomfort was improved and she wished to defer surgery.  I saw her on October 03, 2020.  Over the prior 6 months  she was on  amlodipine 10 mg, benazepril 40 mg, HCTZ 12.5 mg in addition to metoprolol succinate 50 mg daily.  She has experienced ankle swelling.  She has had some recurrent knee discomfort and is now planning to undergo knee surgery with Dr. Ethlyn Gallery.  During that evaluation, she was felt to be cardiovascularly stable patient was given clearance to undergo her knee replacement surgery.  I saw her on November 13, 2021.  At that time she felt well but had recently been hospitalized around Thanksgiving 2022 with left submandibular  inflammation/cellulitis.  She was seen by ENT.  She required antibiotics as well as Decadron taper.  She was discharged and then readmitted.  She is completed her course of therapy with resolution of symptomatology.  Presently she admits to being more short of breath with walking.  She denies any exertional chest tightness.  She admits to being fatigued.  She sleeps by herself she is unaware of snoring.  She does have frequent awakenings.  She continues to be on amlodipine 10 mg, benazepril 40 mg, HCTZ 12.5 mg, and metoprolol succinate 50 mg daily.  She is on metformin 500 mg for diabetes and is on as needed albuterol and Wixela in hub inhaler.  I recommended that she undergo a follow-up echo Doppler study for reassessment of LV function.  I also discussed the possibility of obstructive sleep apnea particularly with her frequent awakenings, nocturia and fatigability.  She underwent an echo Doppler study on November 19, 2021.  Echo showed normal LV function with EF 55 to 60%, grade 1 diastolic dysfunction and mild MR and mild AR.  I last saw her on November 18, 2022.  At that time she felt well but had issues with her back and recently underwent a kyphoplasty procedure due to a compression fracture of her thoracic vertebrae.  She denies any chest pain or shortness of breath.  She continues to be on amlodipine 10 mg, benazepril 40 mg, HCTZ 12.5 mg daily in addition to metoprolol 50 mg for blood pressure control.  She is on Symbicort 160/4.5 and takes as needed albuterol.  She is on metformin for diabetes mellitus.    Since I last saw her, she has continued to see Dr. Philip Aspen for primary care.  She denies any chest pain or shortness of breath.  She has had some issues with stomach discomfort and plans to see Dr. Marina Goodell.  She had undergone laboratory on October 26, 2023 at [facility].  Renal function was stable with BUN 17 creatinine 0.82.  Hemoglobin 12.9/hematocrit 39.4.  LFTs were normal.   During that evaluation, her blood pressure was elevated at 160/80.  She is felt to have erosive osteoarthritis of both hands, as well as osteoporosis, postmenopausal and low back pain at multiple sites.  Presently, she continues to be on amlodipine 10 mg, HCTZ 12.5 mg, losartan 100 mg daily, and metoprolol succinate 50 mg for blood pressure control.  She continues to be on omeprazole for GERD.  She takes Celebrex.  She is diabetic on metformin.  She presents for injury evaluation.   Past Medical History:  Diagnosis Date   Allergic rhinitis, seasonal    Arthritis    Asthma    bronchial with weather change   Diabetes mellitus without complication (HCC)    type 2    Dysrhythmia    "Skipping a beat"   GERD (gastroesophageal reflux disease)    History of colon polyps    Hypertension    Pneumonia  Rotator cuff tear, right     Past Surgical History:  Procedure Laterality Date   BACK SURGERY     x3  ruptured disc    CHOLECYSTECTOMY OPEN  AGE 1   EYE SURGERY     bil cataracts   HERNIA REPAIR     INGUINAL HERNIA REPAIR Right AGE 1s   IR KYPHO THORACIC WITH BONE BIOPSY  10/30/2022   IR RADIOLOGIST EVAL & MGMT  10/07/2022   KNEE ARTHROSCOPY Left 2014   LUMBAR DISC SURGERY  x2  LAST DATE EARLY 2000s   ORIF ANKLE FRACTURE Right 06/24/2018   Procedure: OPEN REDUCTION INTERNAL FIXATION (ORIF) RIGHT ANKLE FRACTURE;  Surgeon: Kathryne Hitch, MD;  Location: [facility];  Service: Orthopedics;  Laterality: Right;   ROTATOR CUFF REPAIR Left 01/2015   TONSILLECTOMY  AGE 40   TOTAL KNEE ARTHROPLASTY Right 12/09/2020   Procedure: TOTAL KNEE ARTHROPLASTY;  Surgeon: Ollen Gross, MD;  Location: [facility];  Service: Orthopedics;  Laterality: Right;    VAGINAL HYSTERECTOMY  AGE 61   WITH BSO    Current Medications: Outpatient Medications Prior to Visit  Medication Sig Dispense Refill   albuterol (VENTOLIN HFA) 108 (90 Base) MCG/ACT inhaler INHALE 2 PUFFS BY MOUTH EVERY 6 HOURS AS NEEDED FOR  WHEEZING 6.7 each 1   alendronate (FOSAMAX) 70 MG tablet Take 70 mg by mouth once a week. Take with a full glass of water on an empty stomach.     amLODipine (NORVASC) 10 MG tablet TAKE 1 TABLET BY MOUTH EVERY DAY 90 tablet 0   celecoxib (CELEBREX) 200 MG capsule Take 200 mg by mouth daily.     ferrous sulfate 325 (65 FE) MG tablet TAKE 1 TABLET BY MOUTH TWICE A DAY WITH FOOD 100 tablet 2   Fluticasone-Salmeterol (AIRDUO RESPICLICK 113/14) 113-14 MCG/ACT AEPB Inhale 2 puffs into the lungs in the morning and at bedtime. 1 each 5   hydrochlorothiazide (HYDRODIURIL) 12.5 MG tablet Take 1 tablet (12.5 mg total) by mouth daily. 90 tablet 3   losartan (COZAAR) 100 MG tablet TAKE 1 TABLET BY MOUTH EVERY DAY 90 tablet 0   metFORMIN (GLUCOPHAGE) 500 MG tablet TAKE 1 TABLET BY MOUTH TWICE A DAY WITH FOOD 180 tablet 1   metoprolol succinate (TOPROL-XL) 50 MG 24 hr tablet TAKE 1 TABLET BY MOUTH EVERY DAY 90 tablet 3   omeprazole (PRILOSEC) 20 MG capsule TAKE 1 CAPSULE (20 MG TOTAL) BY MOUTH 2 (TWO) TIMES DAILY BEFORE A MEAL. 180 capsule 1   Spacer/Aero-Holding Chambers DEVI Use with Symbicort inhaler 1 each 2   traMADol (ULTRAM) 50 MG tablet Take by mouth every 6 (six) hours as needed.     No facility-administered medications prior to visit.     Allergies:   Clonidine   Social History   Socioeconomic History   Marital status: Widowed    Spouse name: Not on file   Number of children: 4   Years of education: Not on file   Highest education level: Associate degree: academic program  Occupational History   Occupation: retired  Tobacco Use   Smoking status: Former    Current packs/day: 0.00    Types: Cigarettes    Start date: 10/19/1978    Quit date: 10/19/1988    Years since quitting: 35.1   Smokeless tobacco: Never  Vaping Use   Vaping status: Never Used  Substance and Sexual Activity   Alcohol use: Yes    Alcohol/week: 3.0 - 4.0 standard drinks  of alcohol    Types: 3 - 4 Glasses of wine per  week    Comment: 2-3 times a week   Drug use: Never   Sexual activity: Not Currently  Other Topics Concern   Not on file  Social History Narrative   Not on file   Social Drivers of Health   Financial Resource Strain: Low Risk  (07/19/2023)   Overall Financial Resource Strain (CARDIA)    Difficulty of Paying Living Expenses: Not hard at all  Food Insecurity: No Food Insecurity (07/19/2023)   Hunger Vital Sign    Worried About Running Out of Food in the Last Year: Never true    Ran Out of Food in the Last Year: Never true  Transportation Needs: No Transportation Needs (07/19/2023)   PRAPARE - Administrator, Civil Service (Medical): No    Lack of Transportation (Non-Medical): No  Physical Activity: Sufficiently Active (07/19/2023)   Exercise Vital Sign    Days of Exercise per Week: 3 days    Minutes of Exercise per Session: 60 min  Stress: No Stress Concern Present (07/19/2023)   [employer] of Occupational Health - Occupational Stress Questionnaire    Feeling of Stress : Not at all  Social Connections: Moderately Integrated (07/19/2023)   Social Connection and Isolation Panel [NHANES]    Frequency of Communication with Friends and Family: More than three times a week    Frequency of Social Gatherings with Friends and Family: More than three times a week    Attends Religious Services: More than 4 times per year    Active Member of [employer] or Organizations: Yes    Attends Banker Meetings: More than 4 times per year    Marital Status: Widowed    Additional social history: She is widowed. She has 4 children, 12 grandchildren and 10 great-grandchildren.  She is retired.  Previously she was a Merchandiser, retail for [employer].  She has associated college degree.  Remotely she had smoked for 5 years.  She quit in 2001.  She does drink occasional wine.  Used to be very active but this has been limited due to her progressive knee pain  Family History: Family history is  notable that her mother died at age 15 with a stroke.  Father died at age 8.  Maternal grandmother died at 23 with natural causes.  She has 4 brothers and 3 sisters.  ROS General: Negative; No fevers, chills, or night sweats;  HEENT: Negative; No changes in vision or hearing, sinus congestion, difficulty swallowing Pulmonary: History of allergy induced bronchial asthma Cardiovascular: See HPI GI: Negative; No nausea, vomiting, diarrhea, or abdominal pain GU: Negative; No dysuria, hematuria, or difficulty voiding Musculoskeletal: Positive for right knee discomfort compression fracture of body of thoracic vertebra, low back pain Hematologic/Oncology: Negative; no easy bruising, bleeding Endocrine: Positive for diabetes Neuro: Negative; no changes in balance, headaches Skin: Negative; No rashes or skin lesions Psychiatric: Negative; No behavioral problems, depression Sleep: Nonrestorative sleep; unaware of snoring, nocturia 3 times per night, no bruxism, restless legs, hypnogognic hallucinations, no cataplexy Other comprehensive 14 point system review is negative.   PHYSICAL EXAM:   VS:  BP 124/78   Pulse 77   Ht 5\' 1"  (1.549 m)   Wt 163 lb 3.2 oz (74 kg)   SpO2 95%   BMI 30.84 kg/m     Repeat blood pressure by me was 126/70  Wt Readings from Last 3 Encounters:  12/01/23 163 lb  3.2 oz (74 kg)  09/02/23 165 lb (74.8 kg)  07/20/23 159 lb 8 oz (72.3 kg)   General: Alert, oriented, no distress.  Skin: normal turgor, no rashes, warm and dry HEENT: Normocephalic, atraumatic. Pupils equal round and reactive to light; sclera anicteric; extraocular muscles intact;  Nose without nasal septal hypertrophy Mouth/Parynx benign; Mallinpatti scale 3 Neck: No JVD, no carotid bruits; normal carotid upstroke Lungs: clear to ausculatation and percussion; no wheezing or rales Chest wall: without tenderness to palpitation Heart: PMI not displaced, RRR, s1 s2 normal, 1/6 systolic murmur, no  diastolic murmur, no rubs, gallops, thrills, or heaves Abdomen: soft, nontender; no hepatosplenomehaly, BS+; abdominal aorta nontender and not dilated by palpation. Back: no CVA tenderness Pulses 2+ Musculoskeletal: full range of motion, normal strength, no joint deformities Extremities: no clubbing cyanosis or edema, Homan's sign negative  Neurologic: grossly nonfocal; Cranial nerves grossly wnl Psychologic: Normal mood and affect   Studies/Labs Reviewed:   EKG Interpretation Date/Time:  Wednesday December 01 2023 08:42:44 EST Ventricular Rate:  77 PR Interval:  144 QRS Duration:  78 QT Interval:  402 QTC Calculation: 454 R Axis:   24  Text Interpretation: Normal sinus rhythm Nonspecific ST and T wave abnormality When compared with ECG of 03-Sep-2021 10:32, PREVIOUS ECG IS PRESENT Confirmed by Nicki Guadalajara ([id number]) on 12/03/2023 10:17:00 AM    November 18, 2022 ECG (independently read by me): NSR at 65, nonspecific T wave abnormality  November 13, 2021 ECG (independently read by me):  NSR at 77, nonspecific T wave  October 08, 2020 ECG (independently read by me): NSR at 69; baseline wander; normal intervals  May 2021 ECG (independently read by me): Sinus rhythm at 78 bpm with PACs and atrial trigeminal rhythm.  QTc interval 3 7 3  ms.  Parable 146 ms  Mar 07, 2020 ECG (independently read by me): Sinus rhythm with PACs and atrial trigeminal rhythm pattern.  QTc interval 446 ms  Recent Labs:    Latest Ref Rng & Units 10/30/2022    7:11 AM 07/16/2022    9:38 AM 09/12/2021    3:01 PM  BMP  Glucose 70 - 99 mg/dL 191  99  478   BUN 8 - 23 mg/dL 11  22  16    Creatinine 0.44 - 1.00 mg/dL 2.95  6.21  3.08   Sodium 135 - 145 mmol/L 137  138  138   Potassium 3.5 - 5.1 mmol/L 3.5  3.8  3.5   Chloride 98 - 111 mmol/L 98  102  103   CO2 22 - 32 mmol/L 30  27  27    Calcium 8.9 - 10.3 mg/dL 9.6  65.7  8.9         Latest Ref Rng & Units 07/16/2022    9:38 AM 11/27/2020   11:13 AM  03/15/2020   11:41 AM  Hepatic Function  Total Protein 6.0 - 8.3 g/dL 7.7  8.0  7.6   Albumin 3.5 - 5.2 g/dL 4.4  4.6  4.2   AST 0 - 37 U/L 15  16  19    ALT 0 - 35 U/L 19  12  16    Alk Phosphatase 39 - 117 U/L 77  53  54   Total Bilirubin 0.2 - 1.2 mg/dL 0.3  0.6  0.5        Latest Ref Rng & Units 10/30/2022    7:11 AM 10/06/2022   12:06 PM 07/16/2022    9:38 AM  CBC  WBC 4.0 -  10.5 K/uL 7.6  7.9  6.1   Hemoglobin 12.0 - 15.0 g/dL 16.1  09.6  04.5   Hematocrit 36.0 - 46.0 % 38.5  40.7  38.8   Platelets 150 - 400 K/uL 327  391.0  282.0    Lab Results  Component Value Date   MCV 93.4 10/30/2022   MCV 91.3 10/06/2022   MCV 85.6 07/16/2022   Lab Results  Component Value Date   TSH 3.640 12/01/2023   Lab Results  Component Value Date   HGBA1C 5.8 (A) 09/02/2023     BNP No results found for: "BNP"  ProBNP No results found for: "PROBNP"   Lipid Panel     Component Value Date/Time   CHOL 241 (H) 12/01/2023 1105   TRIG 121 12/01/2023 1105   TRIG 82 09/30/2006 0943   HDL 84 12/01/2023 1105   CHOLHDL 2.9 12/01/2023 1105   CHOLHDL 3 07/16/2022 0938   VLDL 18.0 07/16/2022 0938   LDLCALC 136 (H) 12/01/2023 1105   LDLDIRECT 118.8 03/30/2013 1145   LABVLDL 21 12/01/2023 1105     RADIOLOGY: No results found.   Additional studies/ records that were reviewed today include:  I reviewed the patient's records from Dr. Philip Aspen.  ECHO 03/12/2020 IMPRESSIONS   1. Mild to moderate LV dysfunction; grade 1 diastolic dysfunction; mild  LVE; mild AI and MR; mild LAE.   2. Left ventricular ejection fraction, by estimation, is 40 to 45%. The  left ventricle has mildly decreased function. The left ventricle  demonstrates global hypokinesis. The left ventricular internal cavity size  was mildly dilated. Left ventricular  diastolic parameters are consistent with Grade I diastolic dysfunction  (impaired relaxation).   3. Right ventricular systolic function is normal.  The right ventricular  size is normal. There is mildly elevated pulmonary artery systolic  pressure.   4. Left atrial size was mildly dilated.   5. The mitral valve is normal in structure. Mild mitral valve  regurgitation. No evidence of mitral stenosis.   6. The aortic valve is tricuspid. Aortic valve regurgitation is mild. No  aortic stenosis is present.   7. The inferior vena cava is normal in size with greater than 50%  respiratory variability, suggesting right atrial pressure of 3 mmHg.    ECHO: 11/19/2021  1. Left ventricular ejection fraction, by estimation, is 55 to 60%. The  left ventricle has normal function. The left ventricle has no regional  wall motion abnormalities. Left ventricular diastolic parameters are  consistent with Grade I diastolic  dysfunction (impaired relaxation).   2. Right ventricular systolic function is normal. The right ventricular  size is normal. There is normal pulmonary artery systolic pressure. The  estimated right ventricular systolic pressure is 29.0 mmHg.   3. The mitral valve is normal in structure. Mild mitral valve  regurgitation. No evidence of mitral stenosis.   4. The aortic valve is normal in structure. Aortic valve regurgitation is  mild. No aortic stenosis is present. Aortic regurgitation PHT measures 467  msec.   5. The inferior vena cava is normal in size with greater than 50%  respiratory variability, suggesting right atrial pressure of 3 mmHg.     ASSESSMENT:    1. Essential hypertension   2. Grade I diastolic dysfunction   3. Mild mitral regurgitation   4. Mild aortic insufficiency   5. Status post kyphoplasty   6. Type 2 diabetes mellitus without complication, without long-term current use of insulin (HCC)   7. 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 Bettyanne Dittman is a very pleasant 83 year old African-American female with a history of essential hypertension for greater than 15  years and had been on amlodipine 10 mg daily, benazepril 40 mg and HCTZ 25 mg.  I saw her for initial evaluation in May 2021 after she had been seen by Dr. Philip Aspen who noted irregularity to her heart rate.  The patient was planning to undergo knee surgery.   When I saw her for initial preoperative evaluation on Mar 07, 2020, her ECG showed sinus rhythm with atrial trigeminy.  Potassium drawn the day prior to her initial evaluation was low at 3.1 and hemoglobin A1c was increased and she was started on metformin and given potassium replacement.  During that initial evaluation I recommended initiation of metoprolol succinate 25 mg daily and reduction of her HCTZ to 12.5 mg daily.  Subsequent lab work showed normalization of potassium. Subsequent laboratory has shown normalization of potassium.  An echo Doppler study May 2021 demonstrated reduced LV function EF 40 -45% with mild LV dilation, aortic insufficiency as well as mitral regurgitation.  With her reduction in LV function, I recommended further evaluation with a Lexiscan Myoview study which was done on March 26, 2020.  This did not reveal any ST segment deviation and she had normal perfusion.  The study was interpreted as low risk.  He was given clearance to undergo her knee surgery which was done by Dr. Antony Odea.  In November 2022 she  developed a left submandibular cellulitis/infarct summation and required Rocephin, Augmentin and Decadron therapy and was  evaluated by ENT.  At her January 2023 evaluation with me she had admitted to experiencing some increased shortness of breath with walking to her car or climbing steps.  A subsequent echo Doppler study on November 19, 2021 continue to show normal LV function which was now 55 to 60%.  She had grade 1 diastolic dysfunction with mild MR and mild AR. She developed a compression fracture at T12 and underwent kyphoplasty in mid January 2024.  She tolerated this well from a cardiovascular standpoint.  Presently  she is on a multi-drug regimen for BP control with amlodipine 10 mg, hydrochlorothiazide 12.5 mg, losartan 100 mg, and metoprolol succinate 100 mg. BP today is well controlled. Most recent lipid panel was in 2023 with TC 197, LDL 106, HDL 73 and TG 90.  I have recommended a fasting lipid panel and TSH be obtained.  I discussed my plans for retirement this year.  I will transition her to the cardiology care of Dr. Chilton Si in one year or sooner as needed.    Medication Adjustments/Labs and Tests Ordered: Current medicines are reviewed at length with the patient today.  Concerns regarding medicines are outlined above.  Medication changes, Labs and Tests ordered today are listed in the Patient Instructions below. Patient Instructions  Medication Instructions:  No medication changes were made during today's visit  *If you need a refill on your cardiac medications before your next appointment, please call your pharmacy*   Lab Work: FASTING LABS WILL BE DRAWN TODAY. If you have labs (blood work) drawn today and your tests are completely normal, you will receive your results only by: MyChart Message (if you have MyChart) OR A paper copy in the mail If you have any lab test that is abnormal or we need to change your treatment, we will call you to review  the results.   Testing/Procedures: No procedures ordered today.    Follow-Up: At [facility], you and your health needs are our priority.  As part of our continuing mission to provide you with exceptional heart care, we have created designated Provider Care Teams.  These Care Teams include your primary Cardiologist (physician) and Advanced Practice Providers (APPs -  Physician Assistants and Nurse Practitioners) who all work together to provide you with the care you need, when you need it.  We recommend signing up for the patient portal called "MyChart".  Sign up information is provided on this After Visit Summary.  MyChart is used  to connect with patients for Virtual Visits (Telemedicine).  Patients are able to view lab/test results, encounter notes, upcoming appointments, etc.  Non-urgent messages can be sent to your provider as well.   To learn more about what you can do with MyChart, go to [email or web address].    Your next appointment:   1 year(s)  Provider:   Chilton Si, MD    Other Instructions Thank you for choosing [facility]!   A letter will be mailed to you as a reminder to call the office for your follow up appointment.     Signed, Nicki Guadalajara, MD  12/03/2023 10:41 AM    [facility] [address], [place], [place]  [address] Phone: [phone]

## 2023-12-03 ENCOUNTER — Encounter: Payer: Self-pay | Admitting: Cardiovascular Disease

## 2023-12-04 ENCOUNTER — Other Ambulatory Visit: Payer: Self-pay | Admitting: Cardiovascular Disease

## 2023-12-04 ENCOUNTER — Other Ambulatory Visit: Payer: Self-pay | Admitting: Internal Medicine

## 2023-12-04 DIAGNOSIS — I10 Essential (primary) hypertension: Secondary | ICD-10-CM

## 2023-12-07 ENCOUNTER — Ambulatory Visit: Payer: Medicare Other | Admitting: Internal Medicine

## 2023-12-07 ENCOUNTER — Encounter: Payer: Self-pay | Admitting: Internal Medicine

## 2023-12-07 ENCOUNTER — Telehealth: Payer: Self-pay | Admitting: Cardiovascular Disease

## 2023-12-07 VITALS — BP 124/84 | HR 97 | Temp 98.0°F | Wt 163.5 lb

## 2023-12-07 DIAGNOSIS — Z79899 Other long term (current) drug therapy: Secondary | ICD-10-CM

## 2023-12-07 DIAGNOSIS — I10 Essential (primary) hypertension: Secondary | ICD-10-CM

## 2023-12-07 DIAGNOSIS — J849 Interstitial pulmonary disease, unspecified: Secondary | ICD-10-CM | POA: Diagnosis not present

## 2023-12-07 DIAGNOSIS — E119 Type 2 diabetes mellitus without complications: Secondary | ICD-10-CM

## 2023-12-07 LAB — POCT GLYCOSYLATED HEMOGLOBIN (HGB A1C): Hemoglobin A1C: 5.9 % — AB (ref 4.0–5.6)

## 2023-12-07 MED ORDER — ROSUVASTATIN CALCIUM 10 MG PO TABS: 10.0000 mg | ORAL_TABLET | Freq: Every evening | ORAL | 3 refills | Status: AC

## 2023-12-07 NOTE — Progress Notes (Signed)
 Established Patient Office Visit     CC/Reason for Visit: Follow-up chronic conditions  HPI: Kristy [name] is a 83 y.o. female who is coming in today for the above mentioned reasons. Past Medical History is significant for: Hypertension, hyperlipidemia, type 2 diabetes.  She is feeling well and has no acute concerns or complaints.   Past Medical/Surgical History: Past Medical History:  Diagnosis Date   Allergic rhinitis, seasonal    Arthritis    Asthma    bronchial with weather change   Diabetes mellitus without complication (HCC)    type 2    Dysrhythmia    "Skipping a beat"   GERD (gastroesophageal reflux disease)    History of colon polyps    Hypertension    Pneumonia    Rotator cuff tear, right     Past Surgical History:  Procedure Laterality Date   BACK SURGERY     x3  ruptured disc    CHOLECYSTECTOMY OPEN  AGE 82   EYE SURGERY     bil cataracts   HERNIA REPAIR     INGUINAL HERNIA REPAIR Right AGE 82s   IR KYPHO THORACIC WITH BONE BIOPSY  10/30/2022   IR RADIOLOGIST EVAL & MGMT  10/07/2022   KNEE ARTHROSCOPY Left 2014   LUMBAR DISC SURGERY  x2  LAST DATE EARLY 2000s   ORIF ANKLE FRACTURE Right 06/24/2018   Procedure: OPEN REDUCTION INTERNAL FIXATION (ORIF) RIGHT ANKLE FRACTURE;  Surgeon: Kathryne Hitch, MD;  Location: [facility];  Service: Orthopedics;  Laterality: Right;   ROTATOR CUFF REPAIR Left 01/2015   TONSILLECTOMY  AGE 43   TOTAL KNEE ARTHROPLASTY Right 12/09/2020   Procedure: TOTAL KNEE ARTHROPLASTY;  Surgeon: Ollen Gross, MD;  Location: [facility];  Service: Orthopedics;  Laterality: Right;    VAGINAL HYSTERECTOMY  AGE 89   WITH BSO    Social History:  reports that she quit smoking about 35 years ago. Her smoking use included cigarettes. She started smoking about 45 years ago. She has never used smokeless tobacco. She reports current alcohol use of about 3.0 - 4.0 standard drinks of alcohol per week. She reports that she does not use  drugs.  Allergies: Allergies  Allergen Reactions   Clonidine Hives    Family History:  Family History  Problem Relation Age of Onset   Colon cancer Neg Hx    Esophageal cancer Neg Hx    Rectal cancer Neg Hx    Stomach cancer Neg Hx    Asthma Neg Hx      Current Outpatient Medications:    albuterol (VENTOLIN HFA) 108 (90 Base) MCG/ACT inhaler, INHALE 2 PUFFS BY MOUTH EVERY 6 HOURS AS NEEDED FOR WHEEZING, Disp: 6.7 each, Rfl: 1   alendronate (FOSAMAX) 70 MG tablet, Take 70 mg by mouth once a week. Take with a full glass of water on an empty stomach., Disp: , Rfl:    amLODipine (NORVASC) 10 MG tablet, TAKE 1 TABLET BY MOUTH EVERY DAY, Disp: 90 tablet, Rfl: 1   celecoxib (CELEBREX) 200 MG capsule, Take 200 mg by mouth daily., Disp: , Rfl:    ferrous sulfate 325 (65 FE) MG tablet, TAKE 1 TABLET BY MOUTH TWICE A DAY WITH FOOD, Disp: 100 tablet, Rfl: 2   Fluticasone-Salmeterol (AIRDUO RESPICLICK 113/14) 113-14 MCG/ACT AEPB, Inhale 2 puffs into the lungs in the morning and at bedtime., Disp: 1 each, Rfl: 5   hydrochlorothiazide (HYDRODIURIL) 12.5 MG tablet, TAKE 1 TABLET BY MOUTH  EVERY DAY, Disp: 90 tablet, Rfl: 3   losartan (COZAAR) 100 MG tablet, TAKE 1 TABLET BY MOUTH EVERY DAY, Disp: 90 tablet, Rfl: 0   metFORMIN (GLUCOPHAGE) 500 MG tablet, TAKE 1 TABLET BY MOUTH TWICE A DAY WITH FOOD, Disp: 180 tablet, Rfl: 1   metoprolol succinate (TOPROL-XL) 50 MG 24 hr tablet, TAKE 1 TABLET BY MOUTH EVERY DAY, Disp: 90 tablet, Rfl: 3   omeprazole (PRILOSEC) 20 MG capsule, TAKE 1 CAPSULE (20 MG TOTAL) BY MOUTH 2 (TWO) TIMES DAILY BEFORE A MEAL., Disp: 180 capsule, Rfl: 1   Spacer/Aero-Holding Chambers DEVI, Use with Symbicort inhaler, Disp: 1 each, Rfl: 2   traMADol (ULTRAM) 50 MG tablet, Take by mouth every 6 (six) hours as needed., Disp: , Rfl:   Review of Systems:  Negative unless indicated in HPI.   Physical Exam: Vitals:   12/07/23 1111  BP: 124/84  Pulse: 97  Temp: 98 F (36.7 C)   TempSrc: Oral  SpO2: 98%  Weight: 163 lb 8 oz (74.2 kg)    Body mass index is 30.89 kg/m.   Physical Exam Vitals reviewed.  Constitutional:      Appearance: Normal appearance.  HENT:     Head: Normocephalic and atraumatic.  Eyes:     Conjunctiva/sclera: Conjunctivae normal.     Pupils: Pupils are equal, round, and reactive to light.  Cardiovascular:     Rate and Rhythm: Normal rate and regular rhythm.  Pulmonary:     Effort: Pulmonary effort is normal.     Breath sounds: Normal breath sounds.  Skin:    General: Skin is warm and dry.  Neurological:     General: No focal deficit present.     Mental Status: She is alert and oriented to person, place, and time.  Psychiatric:        Mood and Affect: Mood normal.        Behavior: Behavior normal.        Thought Content: Thought content normal.        Judgment: Judgment normal.      Impression and Plan:  Type 2 diabetes mellitus without complication, unspecified whether long term insulin use (HCC) -     POCT glycosylated hemoglobin (Hb A1C)  ILD (interstitial lung disease) (HCC)  Essential hypertension   -Blood pressure is well-controlled on current. -A1c demonstrates excellent diabetic management at 5.9. -She continues routine follow-up with pulmonary in regards to her ILD.  Time spent:31 minutes reviewing chart, interviewing and examining patient and formulating plan of care.     Chaya Jan, MD [facility]

## 2023-12-07 NOTE — Telephone Encounter (Signed)
 Patient was returning call. Please advise ?

## 2023-12-07 NOTE — Telephone Encounter (Signed)
 Kristy Bihari, MD 12/03/2023 10:46 AM EST     Lipids have increased since 2023. TC now 241, LDL 136. Recommend initiation of statin with rosuvastatin to start at 10 mg; improve diet. Re-check CMET and FLP in 3 months with LP(a). TSH nl.   Patient identification verified by 2 forms. [name], RN    Called and spoke to patient  Relayed provider message  Reviewed Rx instruction/education  Patient aware:   -Rx sent to preferred pharmacy   -to complete repeat lab ~03/05/24 Patient verbalized understanding, no questions at this time

## 2023-12-22 ENCOUNTER — Other Ambulatory Visit: Payer: Self-pay | Admitting: Internal Medicine

## 2023-12-22 DIAGNOSIS — I152 Hypertension secondary to endocrine disorders: Secondary | ICD-10-CM

## 2024-01-10 DIAGNOSIS — M1712 Unilateral primary osteoarthritis, left knee: Secondary | ICD-10-CM | POA: Diagnosis not present

## 2024-01-11 ENCOUNTER — Other Ambulatory Visit: Payer: Self-pay | Admitting: Adult Health

## 2024-01-11 DIAGNOSIS — J4520 Mild intermittent asthma, uncomplicated: Secondary | ICD-10-CM

## 2024-01-26 NOTE — Telephone Encounter (Signed)
 Copied from CRM [id number]. Topic: Clinical - Medication Refill >> Jan 26, 2024  2:37 PM Sundra Aland wrote: Most Recent Primary Care Visit:  Provider: Henderson Cloud  Department: [facility]  Visit Type: OFFICE VISIT  Date: 12/07/2023  Medication: Fluticasone-Salmeterol (AIRDUO RESPICLICK 113/14) 113-14 MCG/ACT AEPB  Has the patient contacted their pharmacy? Yes (Agent: If no, request that the patient contact the pharmacy for the refill. If patient does not wish to contact the pharmacy document the reason why and proceed with request.) (Agent: If yes, when and what did the pharmacy advise?)  Is this the correct pharmacy for this prescription? No If no, delete pharmacy and type the correct one.  This is the patient's preferred pharmacy:   [id number], [place] - [address] [place] [address] Phone: [phone] Fax: [phone]   Has the prescription been filled recently? Yes  Is the patient out of the medication? Yes  Has the patient been seen for an appointment in the last year OR does the patient have an upcoming appointment? Yes  Can we respond through MyChart? Yes  Agent: Please be advised that Rx refills may take up to 3 business days. We ask that you follow-up with your pharmacy.

## 2024-02-01 DIAGNOSIS — Z23 Encounter for immunization: Secondary | ICD-10-CM | POA: Diagnosis not present

## 2024-02-28 ENCOUNTER — Encounter (HOSPITAL_COMMUNITY): Payer: Self-pay

## 2024-03-06 ENCOUNTER — Ambulatory Visit: Payer: Medicare Other | Admitting: Internal Medicine

## 2024-03-06 ENCOUNTER — Encounter: Payer: Self-pay | Admitting: Internal Medicine

## 2024-03-06 VITALS — BP 120/66 | HR 83 | Temp 97.7°F | Wt 162.1 lb

## 2024-03-06 DIAGNOSIS — J849 Interstitial pulmonary disease, unspecified: Secondary | ICD-10-CM

## 2024-03-06 DIAGNOSIS — E1169 Type 2 diabetes mellitus with other specified complication: Secondary | ICD-10-CM | POA: Diagnosis not present

## 2024-03-06 DIAGNOSIS — E785 Hyperlipidemia, unspecified: Secondary | ICD-10-CM | POA: Diagnosis not present

## 2024-03-06 DIAGNOSIS — E119 Type 2 diabetes mellitus without complications: Secondary | ICD-10-CM

## 2024-03-06 DIAGNOSIS — J4540 Moderate persistent asthma, uncomplicated: Secondary | ICD-10-CM | POA: Diagnosis not present

## 2024-03-06 DIAGNOSIS — I10 Essential (primary) hypertension: Secondary | ICD-10-CM

## 2024-03-06 LAB — POCT GLYCOSYLATED HEMOGLOBIN (HGB A1C): Hemoglobin A1C: 6.2 % — AB (ref 4.0–5.6)

## 2024-03-06 LAB — LIPID PANEL
Cholesterol: 173 mg/dL (ref 0–200)
HDL: 75.3 mg/dL (ref >39.00)
LDL Cholesterol: 74 mg/dL (ref 0–99)
NonHDL: 97.44
Total CHOL/HDL Ratio: 2
Triglycerides: 117 mg/dL (ref 0.0–149.0)
VLDL: 23.4 mg/dL (ref 0.0–40.0)

## 2024-03-06 LAB — MICROALBUMIN / CREATININE URINE RATIO
Creatinine,U: 79.6 mg/dL
Microalb Creat Ratio: UNDETERMINED mg/g (ref 0.0–30.0)
Microalb, Ur: <0.7 mg/dL

## 2024-03-06 MED ORDER — FLUTICASONE-SALMETEROL 113-14 MCG/ACT IN AEPB
2.0000 | INHALATION_SPRAY | Freq: Two times a day (BID) | RESPIRATORY_TRACT | 5 refills | Status: DC
Start: 2024-03-06 — End: 2024-03-29

## 2024-03-06 NOTE — Progress Notes (Signed)
 Established Patient Office Visit     CC/Reason for Visit: Follow-up chronic conditions  HPI: Kristy [name] is a 83 y.o. female who is coming in today for the above mentioned reasons. Past Medical History is significant for: Hypertension, hyperlipidemia, type 2 diabetes.  Feeling well without concerns or complaints.  In February she is increased rosuvastatin  dose due to suboptimal LDL of 136.  Tolerating well.   Past Medical/Surgical History: Past Medical History:  Diagnosis Date   Allergic rhinitis, seasonal    Arthritis    Asthma    bronchial with weather change   Diabetes mellitus without complication (HCC)    type 2    Dysrhythmia    "Skipping a beat"   GERD (gastroesophageal reflux disease)    History of colon polyps    Hypertension    Pneumonia    Rotator cuff tear, right     Past Surgical History:  Procedure Laterality Date   BACK SURGERY     x3  ruptured disc    CHOLECYSTECTOMY OPEN  AGE 1   EYE SURGERY     bil cataracts   HERNIA REPAIR     INGUINAL HERNIA REPAIR Right AGE 1s   IR KYPHO THORACIC WITH BONE BIOPSY  10/30/2022   IR RADIOLOGIST EVAL & MGMT  10/07/2022   KNEE ARTHROSCOPY Left 2014   LUMBAR DISC SURGERY  x2  LAST DATE EARLY 2000s   ORIF ANKLE FRACTURE Right 06/24/2018   Procedure: OPEN REDUCTION INTERNAL FIXATION (ORIF) RIGHT ANKLE FRACTURE;  Surgeon: Arnie Lao, MD;  Location: [facility];  Service: Orthopedics;  Laterality: Right;   ROTATOR CUFF REPAIR Left 01/2015   TONSILLECTOMY  AGE 75   TOTAL KNEE ARTHROPLASTY Right 12/09/2020   Procedure: TOTAL KNEE ARTHROPLASTY;  Surgeon: Liliane Rei, MD;  Location: [facility];  Service: Orthopedics;  Laterality: Right;    VAGINAL HYSTERECTOMY  AGE 89   WITH BSO    Social History:  reports that she quit smoking about 35 years ago. Her smoking use included cigarettes. She started smoking about 45 years ago. She has never used smokeless tobacco. She reports current alcohol use of about 3.0  - 4.0 standard drinks of alcohol per week. She reports that she does not use drugs.  Allergies: Allergies  Allergen Reactions   Clonidine Hives    Family History:  Family History  Problem Relation Age of Onset   Colon cancer Neg Hx    Esophageal cancer Neg Hx    Rectal cancer Neg Hx    Stomach cancer Neg Hx    Asthma Neg Hx      Current Outpatient Medications:    albuterol  (VENTOLIN  HFA) 108 (90 Base) MCG/ACT inhaler, TAKE 2 PUFFS BY MOUTH EVERY 6 HOURS AS NEEDED FOR WHEEZE, Disp: 18 each, Rfl: 9   alendronate (FOSAMAX) 70 MG tablet, Take 70 mg by mouth once a week. Take with a full glass of water  on an empty stomach., Disp: , Rfl:    amLODipine  (NORVASC ) 10 MG tablet, TAKE 1 TABLET BY MOUTH EVERY DAY, Disp: 90 tablet, Rfl: 1   celecoxib  (CELEBREX ) 200 MG capsule, Take 200 mg by mouth daily., Disp: , Rfl:    ferrous sulfate  325 (65 FE) MG tablet, TAKE 1 TABLET BY MOUTH TWICE A DAY WITH FOOD, Disp: 100 tablet, Rfl: 2   hydrochlorothiazide  (HYDRODIURIL ) 12.5 MG tablet, TAKE 1 TABLET BY MOUTH EVERY DAY, Disp: 90 tablet, Rfl: 3   losartan  (COZAAR ) 100 MG tablet,  TAKE 1 TABLET BY MOUTH EVERY DAY, Disp: 90 tablet, Rfl: 1   metFORMIN  (GLUCOPHAGE ) 500 MG tablet, TAKE 1 TABLET BY MOUTH TWICE A DAY WITH FOOD, Disp: 180 tablet, Rfl: 1   metoprolol  succinate (TOPROL -XL) 50 MG 24 hr tablet, TAKE 1 TABLET BY MOUTH EVERY DAY, Disp: 90 tablet, Rfl: 3   omeprazole  (PRILOSEC) 20 MG capsule, TAKE 1 CAPSULE (20 MG TOTAL) BY MOUTH 2 (TWO) TIMES DAILY BEFORE A MEAL., Disp: 180 capsule, Rfl: 1   rosuvastatin  (CRESTOR ) 10 MG tablet, Take 1 tablet (10 mg total) by mouth at bedtime., Disp: 90 tablet, Rfl: 3   Spacer/Aero-Holding Chambers DEVI, Use with Symbicort  inhaler, Disp: 1 each, Rfl: 2   traMADol  (ULTRAM ) 50 MG tablet, Take by mouth every 6 (six) hours as needed., Disp: , Rfl:    Fluticasone -Salmeterol (AIRDUO RESPICLICK 113/14) 113-14 MCG/ACT AEPB, Inhale 2 puffs into the lungs in the morning and at  bedtime., Disp: 1 each, Rfl: 5  Review of Systems:  Negative unless indicated in HPI.   Physical Exam: Vitals:   03/06/24 1056  BP: 120/66  Pulse: 83  Temp: 97.7 F (36.5 C)  TempSrc: Oral  SpO2: 95%  Weight: 162 lb 1.6 oz (73.5 kg)    Body mass index is 30.63 kg/m.   Physical Exam Vitals reviewed.  Constitutional:      Appearance: Normal appearance. She is obese.  HENT:     Head: Normocephalic and atraumatic.  Eyes:     Conjunctiva/sclera: Conjunctivae normal.  Cardiovascular:     Rate and Rhythm: Normal rate and regular rhythm.  Pulmonary:     Effort: Pulmonary effort is normal.     Breath sounds: Normal breath sounds.  Skin:    General: Skin is warm and dry.  Neurological:     General: No focal deficit present.     Mental Status: She is alert and oriented to person, place, and time.  Psychiatric:        Mood and Affect: Mood normal.        Behavior: Behavior normal.        Thought Content: Thought content normal.        Judgment: Judgment normal.      Impression and Plan:  Type 2 diabetes mellitus without complication, unspecified whether long term insulin  use (HCC) -     POCT glycosylated hemoglobin (Hb A1C) -     Microalbumin / creatinine urine ratio; Future -     Lipid panel; Future -     Microalbumin / creatinine urine ratio; Future  Moderate persistent asthma, unspecified whether complicated -     Fluticasone -Salmeterol; Inhale 2 puffs into the lungs in the morning and at bedtime.  Dispense: 1 each; Refill: 5  Essential hypertension  ILD (interstitial lung disease) (HCC)  Hyperlipidemia associated with type 2 diabetes mellitus (HCC)   - A1c of 6.2 demonstrates excellent diabetic management. - Blood pressure is well-controlled on current. - Check lipids today on rosuvastatin  10 mg daily.  Time spent:31 minutes reviewing chart, interviewing and examining patient and formulating plan of care.     Marguerita Shih, MD [facility]

## 2024-03-28 ENCOUNTER — Other Ambulatory Visit: Payer: Self-pay | Admitting: Internal Medicine

## 2024-03-28 DIAGNOSIS — D649 Anemia, unspecified: Secondary | ICD-10-CM

## 2024-03-29 ENCOUNTER — Telehealth: Payer: Self-pay | Admitting: *Deleted

## 2024-03-29 ENCOUNTER — Ambulatory Visit (INDEPENDENT_AMBULATORY_CARE_PROVIDER_SITE_OTHER): Admitting: Internal Medicine

## 2024-03-29 ENCOUNTER — Encounter: Payer: Self-pay | Admitting: Internal Medicine

## 2024-03-29 VITALS — BP 110/80 | HR 71 | Temp 98.0°F | Ht 61.0 in | Wt 161.4 lb

## 2024-03-29 DIAGNOSIS — R9389 Abnormal findings on diagnostic imaging of other specified body structures: Secondary | ICD-10-CM | POA: Diagnosis not present

## 2024-03-29 DIAGNOSIS — J4540 Moderate persistent asthma, uncomplicated: Secondary | ICD-10-CM

## 2024-03-29 MED ORDER — BUDESONIDE-FORMOTEROL FUMARATE 160-4.5 MCG/ACT IN AERO
2.0000 | INHALATION_SPRAY | Freq: Two times a day (BID) | RESPIRATORY_TRACT | 11 refills | Status: AC
Start: 2024-03-29 — End: ?

## 2024-03-29 NOTE — Progress Notes (Signed)
 [name]    [id number]    [birth year]  Primary Care Physician:Hernandez Fran Imus, MD Date of Appointment: 03/29/2024 Established Patient Visit  Chief complaint:   Chief Complaint  Patient presents with   Follow-up    Needs inhaler refilled.     HPI: Kristy [name] is a 83 y.o. 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   Interval Updates: Here for asthma follow up. Doesn't feel airduo is doing enough.  Using albuterol  4-5 times/day. Usually with exertion the symptoms worsen and not at rest.   Reflux is controlled.   Current Regimen: airduo respiclick 2 puffs twice a day,  prn albuterol . Asthma Triggers: allergies, URI, exertion Exacerbations in the last year: History of hospitalization or intubation: never Allergy  Testing: yes negative in 2023 on blood work GERD: yes on PPI Allergic Rhinitis: yes on zyrtec as needed ACT:  Asthma Control Test ACT Total Score  03/29/2024 12:00 PM 14  06/22/2023 10:44 AM 24  01/27/2023  8:39 AM 20   FeNO:  I have reviewed the patient's family social and past medical history and updated as appropriate.   Past Medical History:  Diagnosis Date   Allergic rhinitis, seasonal    Arthritis    Asthma    bronchial with weather change   Diabetes mellitus without complication (HCC)    type 2    Dysrhythmia    Skipping a beat   GERD (gastroesophageal reflux disease)    History of colon polyps    Hypertension    Pneumonia    Rotator cuff tear, right     Past Surgical History:  Procedure Laterality Date   BACK SURGERY     x3  ruptured disc    CHOLECYSTECTOMY OPEN  AGE 61   EYE SURGERY     bil cataracts   HERNIA REPAIR     INGUINAL HERNIA REPAIR Right AGE 61s   IR KYPHO THORACIC WITH BONE BIOPSY  10/30/2022   IR RADIOLOGIST EVAL & MGMT  10/07/2022   KNEE ARTHROSCOPY Left 2014   LUMBAR DISC SURGERY  x2  LAST  DATE EARLY 2000s   ORIF ANKLE FRACTURE Right 06/24/2018   Procedure: OPEN REDUCTION INTERNAL FIXATION (ORIF) RIGHT ANKLE FRACTURE;  Surgeon: Arnie Lao, MD;  Location: [facility];  Service: Orthopedics;  Laterality: Right;   ROTATOR CUFF REPAIR Left 01/2015   TONSILLECTOMY  AGE 38   TOTAL KNEE ARTHROPLASTY Right 12/09/2020   Procedure: TOTAL KNEE ARTHROPLASTY;  Surgeon: Liliane Rei, MD;  Location[facility];  Service: Orthopedics;  Laterality: Right;    VAGINAL HYSTERECTOMY  AGE 71   WITH BSO    Family History  Problem Relation Age of Onset   Colon cancer Neg Hx    Esophageal cancer Neg Hx    Rectal cancer Neg Hx    Stomach cancer Neg Hx    Asthma Neg Hx     Social History   Occupational History   Occupation: retired  Tobacco Use   Smoking status: Former    Current packs/day: 0.00    Types: Cigarettes    Start date: 10/19/1978    Quit date: 10/19/1988    Years since quitting: 35.4   Smokeless tobacco: Never  Vaping Use   Vaping status: Never Used  Substance and Sexual Activity   Alcohol use: Yes    Alcohol/week: 3.0 - 4.0  standard drinks of alcohol    Types: 3 - 4 Glasses of wine per week    Comment: 2-3 times a week   Drug use: Never   Sexual activity: Not Currently     Physical Exam: Blood pressure 110/80, pulse 71, temperature 98 F (36.7 C), temperature source Oral, height 5' 1 (1.549 m), weight 161 lb 6.4 oz (73.2 kg), SpO2 94%.  Gen:      No acute distress Lungs:    ctab no wheezes or crackles CV:         RRR no mrg   Data Reviewed: Imaging: I have personally reviewed the CT Chest obtained Feb 2024 shows collapse of posterior membrane of trachea >50% with expiration. Mild lower lobe predominant subpleural stranding with few areas of bronchiectasis. Could be earlier ILD. No frank honeycombing.   PFTs:     Latest Ref Rng & Units 10/21/2022   11:05 AM  PFT Results  FVC-Pre L 1.59   FVC-Predicted Pre % 74   FVC-Post L 1.61   FVC-Predicted Post  % 76   Pre FEV1/FVC % % 86   Post FEV1/FCV % % 84   FEV1-Pre L 1.36   FEV1-Predicted Pre % 87   FEV1-Post L 1.35   DLCO uncorrected ml/min/mmHg 15.50   DLCO UNC% % 92   DLCO corrected ml/min/mmHg 15.31   DLCO COR %Predicted % 91   DLVA Predicted % 138   TLC L 2.54   TLC % Predicted % 55   RV % Predicted % 30    I have personally reviewed the patient's PFTs and moderate restriction to ventilation based on Z score. Normal diffusion capacity.   Labs: Lab Results  Component Value Date   NA 137 10/30/2022   K 3.5 10/30/2022   CO2 30 10/30/2022   GLUCOSE 111 (H) 10/30/2022   BUN 11 10/30/2022   CREATININE 0.73 10/30/2022   CALCIUM  9.6 10/30/2022   GFR 60.93 07/16/2022   GFRNONAA >60 10/30/2022   Lab Results  Component Value Date   WBC 7.6 10/30/2022   HGB 12.6 10/30/2022   HCT 38.5 10/30/2022   MCV 93.4 10/30/2022   PLT 327 10/30/2022    Immunization status: Immunization History  Administered Date(s) Administered   Fluad Quad(high Dose 65+) 07/05/2019, 08/22/2020, 07/13/2022   Influenza Split 09/23/2011, 09/09/2013   Influenza Whole 10/04/2007, 08/07/2008, 09/16/2009   Influenza, High Dose Seasonal PF 10/04/2015, 08/05/2016, 09/14/2017, 09/08/2018, 06/28/2023   Influenza,inj,Quad PF,6+ Mos 08/07/2014   Influenza,inj,quad, With Preservative 02/05/2021   Influenza-Unspecified 08/05/2021   Moderna Covid-19 Fall Seasonal Vaccine 21yrs & older 06/28/2023, 02/01/2024   Moderna SARS-COV2 Booster Vaccination 02/20/2023   Moderna Sars-Covid-2 Vaccination 12/17/2019   PFIZER Comirnaty(Gray Top)Covid-19 Tri-Sucrose Vaccine 02/05/2021, 07/13/2022   PFIZER(Purple Top)SARS-COV-2 Vaccination 11/07/2019, 11/28/2019, 07/18/2020   PNEUMOCOCCAL CONJUGATE-20 02/05/2021, 07/13/2022   Pfizer Covid-19 Vaccine Bivalent Booster 23yrs & up 08/05/2021   Pneumococcal Conjugate-13 10/09/2013   Pneumococcal Polysaccharide-23 04/03/2016   Respiratory Syncytial Virus Vaccine,Recomb  Aduvanted(Arexvy) 06/28/2023   Tetanus 10/09/2013    External Records Personally Reviewed:   Assessment:  Moderate persistent asthma Allergic Rhinits 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  Tracheobronchomalacia Abnormal CT Chest    Plan/Recommendations: Sorry to hear your breathing has not been doing well.  We are stopping the air duo and putting you back on Symbicort  I have sent this to [employer].    In the meantime we will also get repeat breathing testing and a CT scan of your  chest to make sure there are no changes from what we saw last year.  I will see you back in a couple of months to follow-up.  Continue prilosec for reflux.  Purse lipped breathing - smell the roses and blow out the candles   Return to Care: Return in about 2 months (around 05/29/2024).   Louie Rover, MD Pulmonary and Critical Care Medicine [facility] [phone]

## 2024-03-29 NOTE — Telephone Encounter (Signed)
 ATC patient regarding PFT that was ordered in September of 2024, this has not been completed or scheduled.  I left a detailed VM that she needs to call the office to schedule the PFT as it was to be completed prior to her fu with Dr. Dione Franks.  Advised her that if she is having any issues with her breathing we will be glad to see her however if not she needs to reschedule after her PFT.  Will await return call.

## 2024-03-29 NOTE — Patient Instructions (Addendum)
 It was a pleasure to see you today!  Please schedule follow up with myself in 2 months.  If my schedule is not open yet, we will contact you with a reminder closer to that time. Please call [phone] if you haven't heard from us  a month before, and always call us  sooner if issues or concerns arise. You can also send us  a message through MyChart, but but aware that this is not to be used for urgent issues and it may take up to 5-7 days to receive a reply. Please be aware that you will likely be able to view your results before I have a chance to respond to them. Please give us  5 business days to respond to any non-urgent results.    Before your next visit I would like you to have: CT Chest and PFT   Sorry to hear your breathing has not been doing well.  We are stopping the air duo and putting you back on Symbicort  I have sent this to [employer].  In the meantime we will also get repeat breathing testing and a CT scan of your chest to make sure there are no changes from what we saw last year.  I will see you back in a couple of months to follow-up.

## 2024-03-29 NOTE — Telephone Encounter (Signed)
 I called and spoke [facility] [address] [facility].  The generic for Airduo Respiclick is not covered by her insurance.  He was wondering if they wanted the name brand.  I advised that the patient states that Symbicort  is now covered and we will likely send that in.  He said the only way they will know if it is covered is with a script.

## 2024-04-05 ENCOUNTER — Ambulatory Visit (HOSPITAL_BASED_OUTPATIENT_CLINIC_OR_DEPARTMENT_OTHER)
Admission: RE | Admit: 2024-04-05 | Discharge: 2024-04-05 | Disposition: A | Discharge status: Home / Self Care | Source: Ambulatory Visit | Attending: Internal Medicine | Admitting: Internal Medicine

## 2024-04-05 DIAGNOSIS — I700 Atherosclerosis of aorta: Secondary | ICD-10-CM | POA: Diagnosis not present

## 2024-04-05 DIAGNOSIS — J984 Other disorders of lung: Secondary | ICD-10-CM | POA: Diagnosis not present

## 2024-04-05 DIAGNOSIS — K449 Diaphragmatic hernia without obstruction or gangrene: Secondary | ICD-10-CM | POA: Diagnosis not present

## 2024-04-05 DIAGNOSIS — R9389 Abnormal findings on diagnostic imaging of other specified body structures: Secondary | ICD-10-CM | POA: Insufficient documentation

## 2024-04-05 NOTE — Telephone Encounter (Signed)
 Patient was seen in the office and she scheduled her PFT while in the office.  Nothing further needed.

## 2024-04-07 DIAGNOSIS — R599 Enlarged lymph nodes, unspecified: Secondary | ICD-10-CM | POA: Diagnosis not present

## 2024-04-07 DIAGNOSIS — J029 Acute pharyngitis, unspecified: Secondary | ICD-10-CM | POA: Diagnosis not present

## 2024-04-10 ENCOUNTER — Ambulatory Visit: Payer: Self-pay | Admitting: Internal Medicine

## 2024-04-10 DIAGNOSIS — Z1231 Encounter for screening mammogram for malignant neoplasm of breast: Secondary | ICD-10-CM | POA: Diagnosis not present

## 2024-04-10 LAB — HM MAMMOGRAPHY

## 2024-04-12 ENCOUNTER — Encounter: Payer: Self-pay | Admitting: Internal Medicine

## 2024-04-17 ENCOUNTER — Ambulatory Visit (HOSPITAL_BASED_OUTPATIENT_CLINIC_OR_DEPARTMENT_OTHER): Admitting: Internal Medicine

## 2024-04-17 DIAGNOSIS — J4540 Moderate persistent asthma, uncomplicated: Secondary | ICD-10-CM

## 2024-04-17 LAB — PULMONARY FUNCTION TEST
DL/VA % pred: 136 %
DL/VA: 5.66 ml/min/mmHg/L
DLCO cor % pred: 97 %
DLCO cor: 16.25 ml/min/mmHg
DLCO unc % pred: 97 %
DLCO unc: 16.25 ml/min/mmHg
FEF 25-75 Post: 1.87 L/s
FEF 25-75 Pre: 1.3 L/s
FEF2575-%Change-Post: 43 %
FEF2575-%Pred-Post: 180 %
FEF2575-%Pred-Pre: 126 %
FEV1-%Change-Post: 9 %
FEV1-%Pred-Post: 81 %
FEV1-%Pred-Pre: 75 %
FEV1-Post: 1.25 L
FEV1-Pre: 1.14 L
FEV1FVC-%Change-Post: -2 %
FEV1FVC-%Pred-Pre: 113 %
FEV6-%Change-Post: 11 %
FEV6-%Pred-Post: 79 %
FEV6-%Pred-Pre: 70 %
FEV6-Post: 1.53 L
FEV6-Pre: 1.37 L
FEV6FVC-%Pred-Post: 106 %
FEV6FVC-%Pred-Pre: 106 %
FVC-%Change-Post: 11 %
FVC-%Pred-Post: 73 %
FVC-%Pred-Pre: 66 %
FVC-Post: 1.53 L
FVC-Pre: 1.37 L
Post FEV1/FVC ratio: 81 %
Post FEV6/FVC ratio: 100 %
Pre FEV1/FVC ratio: 83 %
Pre FEV6/FVC Ratio: 100 %
RV % pred: 99 %
RV: 2.32 L
TLC % pred: 86 %
TLC: 3.99 L

## 2024-04-17 NOTE — Patient Instructions (Signed)
 Full PFT performed today.

## 2024-04-17 NOTE — Progress Notes (Signed)
 Full PFT performed today.

## 2024-04-18 DIAGNOSIS — M810 Age-related osteoporosis without current pathological fracture: Secondary | ICD-10-CM | POA: Diagnosis not present

## 2024-04-18 DIAGNOSIS — S22000A Wedge compression fracture of unspecified thoracic vertebra, initial encounter for closed fracture: Secondary | ICD-10-CM | POA: Diagnosis not present

## 2024-04-18 DIAGNOSIS — M154 Erosive (osteo)arthritis: Secondary | ICD-10-CM | POA: Diagnosis not present

## 2024-04-18 DIAGNOSIS — E663 Overweight: Secondary | ICD-10-CM | POA: Diagnosis not present

## 2024-04-18 DIAGNOSIS — M545 Low back pain, unspecified: Secondary | ICD-10-CM | POA: Diagnosis not present

## 2024-04-18 DIAGNOSIS — Z79899 Other long term (current) drug therapy: Secondary | ICD-10-CM | POA: Diagnosis not present

## 2024-04-18 DIAGNOSIS — Z6829 Body mass index (BMI) 29.0-29.9, adult: Secondary | ICD-10-CM | POA: Diagnosis not present

## 2024-05-02 DIAGNOSIS — R7303 Prediabetes: Secondary | ICD-10-CM | POA: Diagnosis not present

## 2024-05-02 DIAGNOSIS — H43813 Vitreous degeneration, bilateral: Secondary | ICD-10-CM | POA: Diagnosis not present

## 2024-05-02 DIAGNOSIS — H52203 Unspecified astigmatism, bilateral: Secondary | ICD-10-CM | POA: Diagnosis not present

## 2024-05-02 DIAGNOSIS — H35033 Hypertensive retinopathy, bilateral: Secondary | ICD-10-CM | POA: Diagnosis not present

## 2024-05-02 DIAGNOSIS — H18593 Other hereditary corneal dystrophies, bilateral: Secondary | ICD-10-CM | POA: Diagnosis not present

## 2024-05-02 LAB — HM DIABETES EYE EXAM

## 2024-05-05 ENCOUNTER — Encounter: Payer: Self-pay | Admitting: Advanced Practice Midwife

## 2024-05-07 ENCOUNTER — Emergency Department (HOSPITAL_BASED_OUTPATIENT_CLINIC_OR_DEPARTMENT_OTHER)
Admission: EM | Admit: 2024-05-07 | Discharge: 2024-05-07 | Disposition: A | Discharge status: Home / Self Care | Attending: Emergency Medicine | Admitting: Emergency Medicine

## 2024-05-07 ENCOUNTER — Emergency Department (HOSPITAL_BASED_OUTPATIENT_CLINIC_OR_DEPARTMENT_OTHER)

## 2024-05-07 ENCOUNTER — Emergency Department (HOSPITAL_BASED_OUTPATIENT_CLINIC_OR_DEPARTMENT_OTHER): Admitting: Radiology

## 2024-05-07 DIAGNOSIS — E119 Type 2 diabetes mellitus without complications: Secondary | ICD-10-CM | POA: Diagnosis not present

## 2024-05-07 DIAGNOSIS — Z79899 Other long term (current) drug therapy: Secondary | ICD-10-CM | POA: Insufficient documentation

## 2024-05-07 DIAGNOSIS — M5126 Other intervertebral disc displacement, lumbar region: Secondary | ICD-10-CM | POA: Diagnosis not present

## 2024-05-07 DIAGNOSIS — Y93E1 Activity, personal bathing and showering: Secondary | ICD-10-CM | POA: Insufficient documentation

## 2024-05-07 DIAGNOSIS — M47812 Spondylosis without myelopathy or radiculopathy, cervical region: Secondary | ICD-10-CM | POA: Diagnosis not present

## 2024-05-07 DIAGNOSIS — I10 Essential (primary) hypertension: Secondary | ICD-10-CM | POA: Diagnosis not present

## 2024-05-07 DIAGNOSIS — M4854XA Collapsed vertebra, not elsewhere classified, thoracic region, initial encounter for fracture: Secondary | ICD-10-CM | POA: Diagnosis not present

## 2024-05-07 DIAGNOSIS — W182XXA Fall in (into) shower or empty bathtub, initial encounter: Secondary | ICD-10-CM | POA: Insufficient documentation

## 2024-05-07 DIAGNOSIS — M5134 Other intervertebral disc degeneration, thoracic region: Secondary | ICD-10-CM | POA: Diagnosis not present

## 2024-05-07 DIAGNOSIS — S0990XA Unspecified injury of head, initial encounter: Secondary | ICD-10-CM | POA: Diagnosis not present

## 2024-05-07 DIAGNOSIS — Z7984 Long term (current) use of oral hypoglycemic drugs: Secondary | ICD-10-CM | POA: Diagnosis not present

## 2024-05-07 DIAGNOSIS — W19XXXA Unspecified fall, initial encounter: Secondary | ICD-10-CM

## 2024-05-07 DIAGNOSIS — M4804 Spinal stenosis, thoracic region: Secondary | ICD-10-CM | POA: Diagnosis not present

## 2024-05-07 DIAGNOSIS — Z043 Encounter for examination and observation following other accident: Secondary | ICD-10-CM | POA: Diagnosis not present

## 2024-05-07 DIAGNOSIS — M5136 Other intervertebral disc degeneration, lumbar region with discogenic back pain only: Secondary | ICD-10-CM | POA: Diagnosis not present

## 2024-05-07 DIAGNOSIS — M5020 Other cervical disc displacement, unspecified cervical region: Secondary | ICD-10-CM | POA: Diagnosis not present

## 2024-05-07 DIAGNOSIS — M47811 Spondylosis without myelopathy or radiculopathy, occipito-atlanto-axial region: Secondary | ICD-10-CM | POA: Diagnosis not present

## 2024-05-07 MED ORDER — ACETAMINOPHEN 325 MG PO TABS
650.0000 mg | ORAL_TABLET | Freq: Once | ORAL | Status: AC
  Administered 2024-05-07: 650 mg via ORAL
  Filled 2024-05-07: qty 2

## 2024-05-07 NOTE — Discharge Instructions (Addendum)
 We discussed the results of your CT head, CT cervical spine, x-ray of your lower back, x-ray of your upper back.  You may alternate ibuprofen  or Tylenol  to help with your symptoms, return to the emergency department if you experience any worsening symptoms.    You were examined today for a head injury and possible concussion.   Your head CT showed no evidence of  Injury today.  Sometimes serious problems can develop after a head injury. Please return to the emergency department if you experience any of the following symptoms: Repeated vomiting Headache that gets worse and does not go away Loss of consciousness or inability to stay awake at times when you   normally would be able to Getting more confused, restless or agitated Convulsions or seizures Difficulty walking or feeling off balance Weakness or numbness Vision changes

## 2024-05-07 NOTE — ED Triage Notes (Signed)
 Mechanical fall this morning and hit head over side of tub. +LOC for a few seconds Denies thinners. Endorses headache.

## 2024-05-07 NOTE — ED Provider Notes (Signed)
 Kristy [name]   CSN: [id number] Arrival date & time: 05/07/24  1344     Patient presents with: Kristy [name] is a 83 y.o. female.   83 year old female with a past medical history of kyphoplasty, hypertension, diabetes presents to the ED status post mechanical fall.  Patient reports she was taking a shower, when suddenly she slipped in the tub, reports she fell backwards striking the back aspect of her head.  She reports she did not try to brace her fall.  She is unsure whether she lost consciousness.  She is endorsing a generalized headache at this time, reports it does not hurt to the back of her head but through her entire head.  She also did hit her back, where she had a prior intervention with a kyphoplasty.  She has not taken any medication for improvement in symptoms.  She has not had any nausea, no vomiting, no changes in vision.No other complaints.   The history is provided by the patient.  Fall This is a new problem. The problem occurs constantly. Associated symptoms include headaches. Pertinent negatives include no chest pain, no abdominal pain and no shortness of breath.       Prior to Admission medications   Medication Sig Start Date End Date Taking? Authorizing Provider  albuterol  (VENTOLIN  HFA) 108 (90 Base) MCG/ACT inhaler TAKE 2 PUFFS BY MOUTH EVERY 6 HOURS AS NEEDED FOR WHEEZE 01/11/24   Parrett, Tammy S, NP  alendronate (FOSAMAX) 70 MG tablet Take 70 mg by mouth once a week. Take with a full glass of water  on an empty stomach.    [provider]  amLODipine  (NORVASC ) 10 MG tablet TAKE 1 TABLET BY MOUTH EVERY DAY 12/06/23   Theophilus Andrews, Tully GRADE, MD  budesonide -formoterol  (SYMBICORT ) 160-4.5 MCG/ACT inhaler Inhale 2 puffs into the lungs in the morning and at bedtime. 03/29/24   Meade Verdon RAMAN, MD  celecoxib  (CELEBREX ) 200 MG capsule Take 200 mg by mouth daily.    [provider]  ferrous  sulfate 325 (65 FE) MG tablet TAKE 1 TABLET BY MOUTH TWICE A DAY WITH FOOD 03/28/24   Theophilus Andrews, Tully GRADE, MD  hydrochlorothiazide  (HYDRODIURIL ) 12.5 MG tablet TAKE 1 TABLET BY MOUTH EVERY DAY 12/06/23   Burnard Debby LABOR, MD  losartan  (COZAAR ) 100 MG tablet TAKE 1 TABLET BY MOUTH EVERY DAY 12/22/23   Theophilus Andrews, Tully GRADE, MD  metFORMIN  (GLUCOPHAGE ) 500 MG tablet TAKE 1 TABLET BY MOUTH TWICE A DAY WITH FOOD 10/07/23   Theophilus Andrews, Tully GRADE, MD  metoprolol  succinate (TOPROL -XL) 50 MG 24 hr tablet TAKE 1 TABLET BY MOUTH EVERY DAY 08/17/23   Burnard Debby LABOR, MD  omeprazole  (PRILOSEC) 20 MG capsule TAKE 1 CAPSULE (20 MG TOTAL) BY MOUTH 2 (TWO) TIMES DAILY BEFORE A MEAL. 11/23/23   Abran Norleen SAILOR, MD  rosuvastatin  (CRESTOR ) 10 MG tablet Take 1 tablet (10 mg total) by mouth at bedtime. 12/07/23 03/06/24  Burnard Debby LABOR, MD  Spacer/Aero-Holding Raguel FRENCH Use with Symbicort  inhaler 10/06/22   Cobb, Comer GAILS, NP  traMADol  (ULTRAM ) 50 MG tablet Take by mouth every 6 (six) hours as needed.    [provider]    Allergies: Clonidine    Review of Systems  Constitutional:  Negative for fever.  HENT:  Negative for sore throat.   Respiratory:  Negative for shortness of breath.   Cardiovascular:  Negative for chest pain.  Gastrointestinal:  Negative for abdominal pain.  Neurological:  Positive for headaches.  All other systems reviewed and are negative.   Updated Vital Signs BP 132/67 (BP Location: Right Arm)   Pulse 73   Temp 97.8 F (36.6 C) (Oral)   Resp 16   SpO2 94%   Physical Exam Vitals and nursing [name] reviewed.  Constitutional:      Appearance: Normal appearance.  HENT:     Head: Normocephalic.     Comments: No obvious bruising or deformity noted.     Mouth/Throat:     Mouth: Mucous membranes are moist.  Neck:     Comments: No midline cervical spine tenderness.  Cardiovascular:     Rate and Rhythm: Normal rate.  Pulmonary:     Effort: Pulmonary effort is  normal.     Breath sounds: No wheezing.  Abdominal:     General: Abdomen is flat.     Palpations: Abdomen is soft.  Musculoskeletal:     Cervical back: Normal range of motion and neck supple.  Lymphadenopathy:     Cervical: No cervical adenopathy.  Skin:    General: Skin is warm and dry.  Neurological:     Mental Status: She is alert and oriented to person, place, and time.     Comments: Moves all upper or lower extremities.      (all labs ordered are listed, but only abnormal results are displayed) Labs Reviewed - No data to display  EKG: None  Radiology: DG Lumbar Spine Complete Result Date: 05/07/2024 CLINICAL DATA:  Pain after fall. EXAM: LUMBAR SPINE - COMPLETE 4+ VIEW COMPARISON:  None Available. FINDINGS: Five non-rib-bearing lumbar vertebra. No evidence of acute fracture or compression deformity. Posterior elements are grossly intact. Trace retrolisthesis of L3 on L4 and L4 on L5. Moderate L4-L5 and mild L3-L4 degenerative disc disease. Lower lumbar facet hypertrophy. No visible pars defects or focal bone abnormality. No sacroiliac diastasis. IMPRESSION: 1. No acute fracture or subluxation of the lumbar spine. 2. Moderate L4-L5 and mild L3-L4 degenerative disc disease. Electronically Signed   By: Andrea Gasman M.D.   On: 05/07/2024 15:47   DG Thoracic Spine 2 View Result Date: 05/07/2024 CLINICAL DATA:  Pain after fall. EXAM: THORACIC SPINE 2 VIEWS COMPARISON:  Reformats from chest CT 04/05/2024 FINDINGS: Overlying artifact from clothing projects over the spine on the frontal view. Slight broad-based dextroscoliotic curvature. Vertebral augmentation of previous T12 compression fracture. No evidence of acute compression deformity or acute fracture. Mild diffuse disc space narrowing and anterior spurring in the midthoracic spine. No paravertebral soft tissue abnormalities. IMPRESSION: 1. No acute fracture of the thoracic spine. 2. Vertebral augmentation of previous T12  compression fracture. 3. Mild degenerative disc disease in the midthoracic spine. Electronically Signed   By: Andrea Gasman M.D.   On: 05/07/2024 15:46   CT Head Wo Contrast Result Date: 05/07/2024 CLINICAL DATA:  Provided history: Fall with head trauma (posterior head strike). Loss of consciousness. EXAM: CT HEAD WITHOUT CONTRAST CT CERVICAL SPINE WITHOUT CONTRAST TECHNIQUE: Multidetector CT imaging of the head and cervical spine was performed following the standard protocol without intravenous contrast. Multiplanar CT image reconstructions of the cervical spine were also generated. RADIATION DOSE REDUCTION: This exam was performed according to the departmental dose-optimization program which includes automated exposure control, adjustment of the mA and/or kV according to patient size and/or use of iterative reconstruction technique. COMPARISON:  Neck CT 10/28/2021. FINDINGS: CT HEAD FINDINGS Brain: Generalized cerebral atrophy. Patchy and ill-defined hypoattenuation within the  cerebral white matter, nonspecific but compatible with mild chronic small vessel ischemic disease. There is no acute intracranial hemorrhage. No demarcated cortical infarct. No extra-axial fluid collection. No evidence of an intracranial mass. No midline shift. Vascular: No hyperdense vessel.  Atherosclerotic calcifications. Skull: No calvarial fracture or aggressive osseous lesion. Sinuses/Orbits: No mass or acute finding within the imaged orbits. Left sphenoid sinusitis. CT CERVICAL SPINE FINDINGS Alignment: Nonspecific reversal of the expected cervical lordosis. Mild C2-C3 grade 1 anterolisthesis. Mild grade 1 retrolisthesis at C4-C5, C5-C6 and C6-C7. Skull base and vertebrae: The basion-dental and atlanto-dental intervals are maintained.No evidence of acute fracture to the cervical spine. Soft tissues and spinal canal: No prevertebral fluid or swelling. No visible canal hematoma. Disc levels: Cervical spondylosis with multilevel  disc space narrowing, disc bulges/central disc protrusions, posterior disc osteophyte complexes and uncovertebral hypertrophy. Multilevel spinal canal narrowing. Most notably at C4-C5, a posterior disc osteophyte complex contributes to up to moderate spinal canal stenosis. Multilevel bony neural foraminal narrowing. Disc space narrowing is greatest at C4-C5 (advanced at this level). Degenerative changes also present at the C1-C2 articulation. Upper chest: No consolidation within the imaged lung apices. No visible pneumothorax. IMPRESSION: CT head: 1.  No evidence of an acute intracranial abnormality. 2. Parenchymal atrophy and chronic small vessel ischemic disease. 3. Left sphenoid sinusitis. CT cervical spine: 1. No evidence of an acute cervical spine fracture. 2. Nonspecific reversal of the expected cervical lordosis. 3. Mild grade 1 spondylolisthesis at C2-C3, C4-C5, C5-C6 and C6-C7. 4. Cervical spondylosis as described. Electronically Signed   By: Rockey Childs D.O.   On: 05/07/2024 15:31   CT Cervical Spine Wo Contrast Result Date: 05/07/2024 CLINICAL DATA:  Provided history: Fall with head trauma (posterior head strike). Loss of consciousness. EXAM: CT HEAD WITHOUT CONTRAST CT CERVICAL SPINE WITHOUT CONTRAST TECHNIQUE: Multidetector CT imaging of the head and cervical spine was performed following the standard protocol without intravenous contrast. Multiplanar CT image reconstructions of the cervical spine were also generated. RADIATION DOSE REDUCTION: This exam was performed according to the departmental dose-optimization program which includes automated exposure control, adjustment of the mA and/or kV according to patient size and/or use of iterative reconstruction technique. COMPARISON:  Neck CT 10/28/2021. FINDINGS: CT HEAD FINDINGS Brain: Generalized cerebral atrophy. Patchy and ill-defined hypoattenuation within the cerebral white matter, nonspecific but compatible with mild chronic small vessel  ischemic disease. There is no acute intracranial hemorrhage. No demarcated cortical infarct. No extra-axial fluid collection. No evidence of an intracranial mass. No midline shift. Vascular: No hyperdense vessel.  Atherosclerotic calcifications. Skull: No calvarial fracture or aggressive osseous lesion. Sinuses/Orbits: No mass or acute finding within the imaged orbits. Left sphenoid sinusitis. CT CERVICAL SPINE FINDINGS Alignment: Nonspecific reversal of the expected cervical lordosis. Mild C2-C3 grade 1 anterolisthesis. Mild grade 1 retrolisthesis at C4-C5, C5-C6 and C6-C7. Skull base and vertebrae: The basion-dental and atlanto-dental intervals are maintained.No evidence of acute fracture to the cervical spine. Soft tissues and spinal canal: No prevertebral fluid or swelling. No visible canal hematoma. Disc levels: Cervical spondylosis with multilevel disc space narrowing, disc bulges/central disc protrusions, posterior disc osteophyte complexes and uncovertebral hypertrophy. Multilevel spinal canal narrowing. Most notably at C4-C5, a posterior disc osteophyte complex contributes to up to moderate spinal canal stenosis. Multilevel bony neural foraminal narrowing. Disc space narrowing is greatest at C4-C5 (advanced at this level). Degenerative changes also present at the C1-C2 articulation. Upper chest: No consolidation within the imaged lung apices. No visible pneumothorax. IMPRESSION: CT head: 1.  No evidence of an acute intracranial abnormality. 2. Parenchymal atrophy and chronic small vessel ischemic disease. 3. Left sphenoid sinusitis. CT cervical spine: 1. No evidence of an acute cervical spine fracture. 2. Nonspecific reversal of the expected cervical lordosis. 3. Mild grade 1 spondylolisthesis at C2-C3, C4-C5, C5-C6 and C6-C7. 4. Cervical spondylosis as described. Electronically Signed   By: Rockey Childs D.O.   On: 05/07/2024 15:31     Procedures   Medications Ordered in the ED  acetaminophen   (TYLENOL ) tablet 650 mg (650 mg Oral Given 05/07/24 1539)                                    Medical Decision Making Amount and/or Complexity of Data Reviewed Radiology: ordered.  Risk OTC drugs.   Patient presented to the ED status post mechanical fall, reports she was in the shower when suddenly she slipped and fell landing on the rim of the tub, got hit in the back of the head.  Endorses a period of LOC, states that she was able to get herself up.  She has ambulated since.  She has taken some Tylenol  to help with her symptoms.  On evaluation she does have pain along the thoracic spine, prior history of kyphoplasty.  Neurological exam is intact, there is no obvious deformity, no wound noted to the occipital part of her head.  Vitals are within normal limits.  CT head, CT cervical spine without any acute finding at this time  Stray of the lumbar spine, x-ray of the thoracic spine without any acute finding.  We discussed close follow-up with PCP, return precautions discussed at length, patient hemodynamically stable for discharge.  Portions of this [name] were generated with Scientist, clinical (histocompatibility and immunogenetics). Dictation errors may occur despite best attempts at proofreading.      Final diagnoses:  Injury of head, initial encounter  Fall, initial encounter    ED Discharge Orders     None          Maureen Broad, PA-C 05/07/24 1614    Patsey Lot, MD 05/08/24 1251

## 2024-05-07 NOTE — ED Notes (Signed)
 DC paperwork given and verbally understood.

## 2024-05-10 ENCOUNTER — Telehealth: Payer: Self-pay

## 2024-05-10 NOTE — Transitions of Care (Post Inpatient/ED Visit) (Signed)
 05/10/2024  Name: Kristy [name] MRN: [id number] DOB: 01-16-1941  Today's TOC FU Call Status: Today's TOC FU Call Status:: Successful TOC FU Call Completed TOC FU Call Complete Date: 05/10/24 Patient's Name and Date of Birth confirmed.  Transition Care Management Follow-up Telephone Call Date of Discharge: 05/07/24 Discharge Facility: [facility] ([facility]) Type of Discharge: Emergency Department Reason for ED Visit: Other: (Fall/Head Injury) How have you been since you were released from the hospital?: Better Any questions or concerns?: No  Items Reviewed: Did you receive and understand the discharge instructions provided?: Yes Medications obtained,verified, and reconciled?: Yes (Medications Reviewed) Any new allergies since your discharge?: No Dietary orders reviewed?: No Do you have support at home?: Yes People in Home [RPT]: grandchild(ren)  Medications Reviewed Today: Medications Reviewed Today     Reviewed by Dionisio Camelia PARAS, RN (Registered Nurse) on 05/10/24 at [phone]  Med List Status: <None>   Medication Order Taking? Sig Documenting Provider Last Dose Status Informant  albuterol  (VENTOLIN  HFA) 108 (90 Base) MCG/ACT inhaler [id number] Yes TAKE 2 PUFFS BY MOUTH EVERY 6 HOURS AS NEEDED FOR WHEEZE Parrett, Tammy S, NP  Active   alendronate (FOSAMAX) 70 MG tablet [id number] Yes Take 70 mg by mouth once a week. Take with a full glass of water  on an empty stomach. [provider]  Active   amLODipine  (NORVASC ) 10 MG tablet [id number] Yes TAKE 1 TABLET BY MOUTH EVERY DAY Theophilus Andrews, Tully GRADE, MD  Active   budesonide -formoterol  (SYMBICORT ) 160-4.5 MCG/ACT inhaler [id number] Yes Inhale 2 puffs into the lungs in the morning and at bedtime. Desai, Nikita S, MD  Active   celecoxib  (CELEBREX ) 200 MG capsule [id number]  Take 200 mg by mouth daily.  Patient not taking: Reported on 05/10/2024   [provider]  Active   ferrous sulfate  325 (65 FE) MG tablet  [id number] Yes TAKE 1 TABLET BY MOUTH TWICE A DAY WITH FOOD Theophilus Andrews, Tully GRADE, MD  Active   hydrochlorothiazide  (HYDRODIURIL ) 12.5 MG tablet [id number] Yes TAKE 1 TABLET BY MOUTH EVERY DAY Burnard Debby LABOR, MD  Active   losartan  (COZAAR ) 100 MG tablet [id number] Yes TAKE 1 TABLET BY MOUTH EVERY DAY Theophilus Andrews, Tully GRADE, MD  Active   metFORMIN  (GLUCOPHAGE ) 500 MG tablet [id number] Yes TAKE 1 TABLET BY MOUTH TWICE A DAY WITH FOOD Theophilus Andrews, Tully GRADE, MD  Active   metoprolol  succinate (TOPROL -XL) 50 MG 24 hr tablet [id number] Yes TAKE 1 TABLET BY MOUTH EVERY DAY Burnard Debby LABOR, MD  Active   omeprazole  (PRILOSEC) 20 MG capsule [id number] Yes TAKE 1 CAPSULE (20 MG TOTAL) BY MOUTH 2 (TWO) TIMES DAILY BEFORE A MEAL. Abran Norleen SAILOR, MD  Active   rosuvastatin  (CRESTOR ) 10 MG tablet [id number] Yes Take 1 tablet (10 mg total) by mouth at bedtime. Burnard Debby LABOR, MD  Active   Spacer/Aero-Holding Chambers DEVI [id number] Yes Use with Symbicort  inhaler Malachy Comer GAILS, NP  Active   traMADol  (ULTRAM ) 50 MG tablet [id number] Yes Take by mouth every 6 (six) hours as needed. [provider]  Active             Home Care and Equipment/Supplies: Were Home Health Services Ordered?: No Any new equipment or medical supplies ordered?: No  Functional Questionnaire: Do you need assistance with bathing/showering or dressing?: Yes Do you need assistance with meal preparation?: Yes Do you need assistance with eating?: Yes Do you have difficulty maintaining continence: Yes Do you need assistance with getting out of  bed/getting out of a chair/moving?: Yes Do you have difficulty managing or taking your medications?: Yes  Follow up appointments reviewed: PCP Follow-up appointment confirmed?: Yes Date of PCP follow-up appointment?: 05/15/24 Follow-up Provider: Dr. Theophilus Specialist [facility] Follow-up appointment confirmed?: NA Do you need transportation to your follow-up appointment?:  No Do you understand care options if your condition(s) worsen?: Yes-patient verbalized understanding    SIGNATURE: Camelia Hind, BSN, RN

## 2024-05-15 ENCOUNTER — Inpatient Hospital Stay: Admitting: Internal Medicine

## 2024-05-15 DIAGNOSIS — R7989 Other specified abnormal findings of blood chemistry: Secondary | ICD-10-CM | POA: Diagnosis not present

## 2024-05-16 ENCOUNTER — Ambulatory Visit (INDEPENDENT_AMBULATORY_CARE_PROVIDER_SITE_OTHER): Admitting: Internal Medicine

## 2024-05-16 ENCOUNTER — Encounter: Payer: Self-pay | Admitting: Internal Medicine

## 2024-05-16 VITALS — BP 120/80 | HR 80 | Temp 97.4°F | Wt 160.9 lb

## 2024-05-16 DIAGNOSIS — Z09 Encounter for follow-up examination after completed treatment for conditions other than malignant neoplasm: Secondary | ICD-10-CM

## 2024-05-16 DIAGNOSIS — W19XXXD Unspecified fall, subsequent encounter: Secondary | ICD-10-CM

## 2024-05-16 DIAGNOSIS — Z87828 Personal history of other (healed) physical injury and trauma: Secondary | ICD-10-CM | POA: Diagnosis not present

## 2024-05-16 NOTE — Progress Notes (Signed)
 Established Patient Office Visit     CC/Reason for Visit: ED follow up  HPI: Kristy [name] is a 83 y.o. female who is coming in today for the above mentioned reasons. Past Medical History is significant for: Hypertension, hyperlipidemia, type 2 diabetes.  On July 20 she slept well on the back top and hit the back part of her head.  She went to the emergency department where CT of the head and cervical spine were without acute findings.  She is feeling back to baseline.  Her children have installed handrails and a non slick surface to the bathtub.  She tells me that her rheumatologist discontinued Celebrex  due to worsening CKD.  I will request labs.   Past Medical/Surgical History: Past Medical History:  Diagnosis Date   Allergic rhinitis, seasonal    Arthritis    Asthma    bronchial with weather change   Diabetes mellitus without complication (HCC)    type 2    Dysrhythmia    Skipping a beat   GERD (gastroesophageal reflux disease)    History of colon polyps    Hypertension    Pneumonia    Rotator cuff tear, right     Past Surgical History:  Procedure Laterality Date   BACK SURGERY     x3  ruptured disc    CHOLECYSTECTOMY OPEN  AGE 30   EYE SURGERY     bil cataracts   HERNIA REPAIR     INGUINAL HERNIA REPAIR Right AGE 30s   IR KYPHO THORACIC WITH BONE BIOPSY  10/30/2022   IR RADIOLOGIST EVAL & MGMT  10/07/2022   KNEE ARTHROSCOPY Left 2014   LUMBAR DISC SURGERY  x2  LAST DATE EARLY 2000s   ORIF ANKLE FRACTURE Right 06/24/2018   Procedure: OPEN REDUCTION INTERNAL FIXATION (ORIF) RIGHT ANKLE FRACTURE;  Surgeon: Vernetta Lonni GRADE, MD;  Location: [facility];  Service: Orthopedics;  Laterality: Right;   ROTATOR CUFF REPAIR Left 01/2015   TONSILLECTOMY  AGE 34   TOTAL KNEE ARTHROPLASTY Right 12/09/2020   Procedure: TOTAL KNEE ARTHROPLASTY;  Surgeon: Melodi Lerner, MD;  Location: [facility];  Service: Orthopedics;  Laterality: Right;    VAGINAL HYSTERECTOMY  AGE 66    WITH BSO    Social History:  reports that she quit smoking about 35 years ago. Her smoking use included cigarettes. She started smoking about 45 years ago. She has never used smokeless tobacco. She reports current alcohol use of about 3.0 - 4.0 standard drinks of alcohol per week. She reports that she does not use drugs.  Allergies: Allergies  Allergen Reactions   Clonidine Hives    Family History:  Family History  Problem Relation Age of Onset   Colon cancer Neg Hx    Esophageal cancer Neg Hx    Rectal cancer Neg Hx    Stomach cancer Neg Hx    Asthma Neg Hx      Current Outpatient Medications:    albuterol  (VENTOLIN  HFA) 108 (90 Base) MCG/ACT inhaler, TAKE 2 PUFFS BY MOUTH EVERY 6 HOURS AS NEEDED FOR WHEEZE, Disp: 18 each, Rfl: 9   alendronate (FOSAMAX) 70 MG tablet, Take 70 mg by mouth once a week. Take with a full glass of water  on an empty stomach., Disp: , Rfl:    amLODipine  (NORVASC ) 10 MG tablet, TAKE 1 TABLET BY MOUTH EVERY DAY, Disp: 90 tablet, Rfl: 1   budesonide -formoterol  (SYMBICORT ) 160-4.5 MCG/ACT inhaler, Inhale 2 puffs into the lungs in  the morning and at bedtime., Disp: 30.6 each, Rfl: 11   ferrous sulfate  325 (65 FE) MG tablet, TAKE 1 TABLET BY MOUTH TWICE A DAY WITH FOOD, Disp: 100 tablet, Rfl: 1   hydrochlorothiazide  (HYDRODIURIL ) 12.5 MG tablet, TAKE 1 TABLET BY MOUTH EVERY DAY, Disp: 90 tablet, Rfl: 3   losartan  (COZAAR ) 100 MG tablet, TAKE 1 TABLET BY MOUTH EVERY DAY, Disp: 90 tablet, Rfl: 1   metFORMIN  (GLUCOPHAGE ) 500 MG tablet, TAKE 1 TABLET BY MOUTH TWICE A DAY WITH FOOD, Disp: 180 tablet, Rfl: 1   metoprolol  succinate (TOPROL -XL) 50 MG 24 hr tablet, TAKE 1 TABLET BY MOUTH EVERY DAY, Disp: 90 tablet, Rfl: 3   omeprazole  (PRILOSEC) 20 MG capsule, TAKE 1 CAPSULE (20 MG TOTAL) BY MOUTH 2 (TWO) TIMES DAILY BEFORE A MEAL., Disp: 180 capsule, Rfl: 1   rosuvastatin  (CRESTOR ) 10 MG tablet, Take 1 tablet (10 mg total) by mouth at bedtime., Disp: 90 tablet, Rfl: 3    Spacer/Aero-Holding Chambers DEVI, Use with Symbicort  inhaler, Disp: 1 each, Rfl: 2   traMADol  (ULTRAM ) 50 MG tablet, Take by mouth every 6 (six) hours as needed., Disp: , Rfl:    celecoxib  (CELEBREX ) 200 MG capsule, Take 200 mg by mouth daily. (Patient not taking: Reported on 05/16/2024), Disp: , Rfl:   Review of Systems:  Negative unless indicated in HPI.   Physical Exam: Vitals:   05/16/24 0840  BP: 120/80  Pulse: 80  Temp: (!) 97.4 F (36.3 C)  TempSrc: Oral  SpO2: 99%  Weight: 160 lb 14.4 oz (73 kg)    Body mass index is 30.4 kg/m.    Impression and Plan:  Hospital discharge follow-up  Fall, subsequent encounter   - ED charts have been reviewed.  CT scan of the head was negative.  Patient feels back to baseline.  Time spent:23 minutes reviewing chart, interviewing and examining patient and formulating plan of care.     Tully Theophilus Andrews, MD [facility]

## 2024-05-20 ENCOUNTER — Other Ambulatory Visit: Payer: Self-pay | Admitting: Internal Medicine

## 2024-05-20 DIAGNOSIS — K219 Gastro-esophageal reflux disease without esophagitis: Secondary | ICD-10-CM

## 2024-05-29 DIAGNOSIS — R7989 Other specified abnormal findings of blood chemistry: Secondary | ICD-10-CM | POA: Diagnosis not present

## 2024-05-29 DIAGNOSIS — S22000A Wedge compression fracture of unspecified thoracic vertebra, initial encounter for closed fracture: Secondary | ICD-10-CM | POA: Diagnosis not present

## 2024-05-29 DIAGNOSIS — E663 Overweight: Secondary | ICD-10-CM | POA: Diagnosis not present

## 2024-05-29 DIAGNOSIS — Z6829 Body mass index (BMI) 29.0-29.9, adult: Secondary | ICD-10-CM | POA: Diagnosis not present

## 2024-05-29 DIAGNOSIS — M545 Low back pain, unspecified: Secondary | ICD-10-CM | POA: Diagnosis not present

## 2024-05-29 DIAGNOSIS — M154 Erosive (osteo)arthritis: Secondary | ICD-10-CM | POA: Diagnosis not present

## 2024-05-29 DIAGNOSIS — Z79899 Other long term (current) drug therapy: Secondary | ICD-10-CM | POA: Diagnosis not present

## 2024-05-30 LAB — LAB REPORT - SCANNED: EGFR: 52

## 2024-06-04 ENCOUNTER — Other Ambulatory Visit: Payer: Self-pay | Admitting: Internal Medicine

## 2024-06-04 DIAGNOSIS — I10 Essential (primary) hypertension: Secondary | ICD-10-CM

## 2024-06-05 ENCOUNTER — Ambulatory Visit (INDEPENDENT_AMBULATORY_CARE_PROVIDER_SITE_OTHER): Admitting: Internal Medicine

## 2024-06-05 ENCOUNTER — Other Ambulatory Visit: Payer: Self-pay | Admitting: General Practice

## 2024-06-05 ENCOUNTER — Encounter: Payer: Self-pay | Admitting: Internal Medicine

## 2024-06-05 VITALS — BP 130/72 | HR 76 | Temp 98.1°F | Ht 61.0 in | Wt 159.0 lb

## 2024-06-05 DIAGNOSIS — Z87891 Personal history of nicotine dependence: Secondary | ICD-10-CM

## 2024-06-05 DIAGNOSIS — K219 Gastro-esophageal reflux disease without esophagitis: Secondary | ICD-10-CM

## 2024-06-05 DIAGNOSIS — R9389 Abnormal findings on diagnostic imaging of other specified body structures: Secondary | ICD-10-CM

## 2024-06-05 DIAGNOSIS — J4540 Moderate persistent asthma, uncomplicated: Secondary | ICD-10-CM | POA: Diagnosis not present

## 2024-06-05 NOTE — Progress Notes (Signed)
 Kristy [name]    [id number]    24-Jun-1941  Primary Care Physician:Hernandez Delma Tully GRADE, MD Date of Appointment: 06/05/2024 Established Patient Visit  Chief complaint:   Chief Complaint  Patient presents with   Asthma     HPI: Kristy [name] is a 83 y.o. 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   Interval Updates: Feels improvement in breathing on symbicort .  No longer needing albutero 4-5 times/day.  Hasn't used since making the switch.   Reflux is controlled. Drinks carbonated beverages daily, occasional wine.  Having voice hoarseness.    Current Regimen: Symbicort  160 2 puffs twice a day. prn albuterol . Asthma Triggers: allergies, URI, exertion Exacerbations in the last year: History of hospitalization or intubation: never Allergy  Testing: yes negative in 2023 on blood work GERD: yes on PPI Allergic Rhinitis: yes on zyrtec as needed ACT:  Asthma Control Test ACT Total Score  06/05/2024 10:55 AM 17  03/29/2024 12:00 PM 14  06/22/2023 10:44 AM 24   FeNO:  I have reviewed the patient's family social and past medical history and updated as appropriate.   Past Medical History:  Diagnosis Date   Allergic rhinitis, seasonal    Arthritis    Asthma    bronchial with weather change   Diabetes mellitus without complication (HCC)    type 2    Dysrhythmia    Skipping a beat   GERD (gastroesophageal reflux disease)    History of colon polyps    Hypertension    Pneumonia    Rotator cuff tear, right     Past Surgical History:  Procedure Laterality Date   BACK SURGERY     x3  ruptured disc    CHOLECYSTECTOMY OPEN  AGE 4   EYE SURGERY     bil cataracts   HERNIA REPAIR     INGUINAL HERNIA REPAIR Right AGE 4s   IR KYPHO THORACIC WITH BONE BIOPSY  10/30/2022   IR RADIOLOGIST EVAL & MGMT  10/07/2022   KNEE ARTHROSCOPY Left 2014   LUMBAR DISC  SURGERY  x2  LAST DATE EARLY 2000s   ORIF ANKLE FRACTURE Right 06/24/2018   Procedure: OPEN REDUCTION INTERNAL FIXATION (ORIF) RIGHT ANKLE FRACTURE;  Surgeon: Vernetta Lonni GRADE, MD;  Location: [facility];  Service: Orthopedics;  Laterality: Right;   ROTATOR CUFF REPAIR Left 01/2015   TONSILLECTOMY  AGE 33   TOTAL KNEE ARTHROPLASTY Right 12/09/2020   Procedure: TOTAL KNEE ARTHROPLASTY;  Surgeon: Melodi Lerner, MD;  Location: [facility];  Service: Orthopedics;  Laterality: Right;    VAGINAL HYSTERECTOMY  AGE 54   WITH BSO    Family History  Problem Relation Age of Onset   Colon cancer Neg Hx    Esophageal cancer Neg Hx    Rectal cancer Neg Hx    Stomach cancer Neg Hx    Asthma Neg Hx     Social History   Occupational History   Occupation: retired  Tobacco Use   Smoking status: Former    Current packs/day: 0.00    Types: Cigarettes    Start date: 10/19/1978    Quit date: 10/19/1988    Years since quitting: 35.6   Smokeless tobacco: Never  Vaping Use   Vaping status: Never Used  Substance and Sexual Activity   Alcohol use: Yes    Alcohol/week: 3.0 - 4.0 standard drinks of  alcohol    Types: 3 - 4 Glasses of wine per week    Comment: 2-3 times a week   Drug use: Never   Sexual activity: Not Currently     Physical Exam: Blood pressure 130/72, pulse 76, temperature 98.1 F (36.7 C), temperature source Oral, height 5' 1 (1.549 m), weight 159 lb (72.1 kg), SpO2 95%.  Gen:      No distress Lungs:    ctab no wheezes or crackles CV:        RRR no mrg   Data Reviewed: Imaging: I have personally reviewed the CT Chest August 2025 shows mild RLL>LLL changes likely related to GERD and microaspiration  PFTs:     Latest Ref Rng & Units 04/17/2024   11:13 AM 10/21/2022   11:05 AM  PFT Results  FVC-Pre L 1.37  1.59   FVC-Predicted Pre % 66  74   FVC-Post L 1.53  1.61   FVC-Predicted Post % 73  76   Pre FEV1/FVC % % 83  86   Post FEV1/FCV % % 81  84   FEV1-Pre L 1.14  1.36    FEV1-Predicted Pre % 75  87   FEV1-Post L 1.25  1.35   DLCO uncorrected ml/min/mmHg 16.25  15.50   DLCO UNC% % 97  92   DLCO corrected ml/min/mmHg 16.25  15.31   DLCO COR %Predicted % 97  91   DLVA Predicted % 136  138   TLC L 3.99  2.54   TLC % Predicted % 86  55   RV % Predicted % 99  30    I have personally reviewed the patient's PFTs and normal pulmonary function  Labs: Lab Results  Component Value Date   NA 137 10/30/2022   K 3.5 10/30/2022   CO2 30 10/30/2022   GLUCOSE 111 (H) 10/30/2022   BUN 11 10/30/2022   CREATININE 0.73 10/30/2022   CALCIUM  9.6 10/30/2022   GFR 60.93 07/16/2022   EGFR 52.0 05/30/2024   GFRNONAA >60 10/30/2022   Lab Results  Component Value Date   WBC 7.6 10/30/2022   HGB 12.6 10/30/2022   HCT 38.5 10/30/2022   MCV 93.4 10/30/2022   PLT 327 10/30/2022    Immunization status: Immunization History  Administered Date(s) Administered   Fluad Quad(high Dose 65+) 07/05/2019, 08/22/2020, 07/13/2022   Influenza Split 09/23/2011, 09/09/2013   Influenza Whole 10/04/2007, 08/07/2008, 09/16/2009   Influenza, High Dose Seasonal PF 10/04/2015, 08/05/2016, 09/14/2017, 09/08/2018, 06/28/2023   Influenza,inj,Quad PF,6+ Mos 08/07/2014   Influenza,inj,quad, With Preservative 02/05/2021   Influenza-Unspecified 08/05/2021   Moderna Covid-19 Fall Seasonal Vaccine 29yrs & older 06/28/2023, 02/01/2024   Moderna SARS-COV2 Booster Vaccination 02/20/2023   Moderna Sars-Covid-2 Vaccination 12/17/2019   PFIZER Comirnaty(Gray Top)Covid-19 Tri-Sucrose Vaccine 02/05/2021, 07/13/2022   PFIZER(Purple Top)SARS-COV-2 Vaccination 11/07/2019, 11/28/2019, 07/18/2020   PNEUMOCOCCAL CONJUGATE-20 02/05/2021, 07/13/2022   Pfizer Covid-19 Vaccine Bivalent Booster 18yrs & up 08/05/2021   Pneumococcal Conjugate-13 10/09/2013   Pneumococcal Polysaccharide-23 04/03/2016   Respiratory Syncytial Virus Vaccine,Recomb Aduvanted(Arexvy) 06/28/2023   Tetanus 10/09/2013    External  Records Personally Reviewed:   Assessment:  Moderate persistent asthma Allergic Rhinits 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  Tracheobronchomalacia Abnormal CT Chest    Plan/Recommendations: So glad to see that the breathing is improved with symbicort .  Continue 2 puffs twice daily, gargle after use.  Continue albuterol  inhaler as needed.  Breathing testing stable from last year.   Continue prilosec for reflux.  Small  changes in the bottoms of your lungs are most likely related to micro-aspiration. I do not think they are causing you any problems.   Review the reflux precautions and dietary changes we discussed.    Return to Care: Return in about 6 months (around 12/06/2024).   Verdon Gore, MD Pulmonary and Critical Care Medicine [facility] Office:[phone]

## 2024-06-05 NOTE — Patient Instructions (Addendum)
 It was a pleasure to see you today!  Please schedule follow up with myself in  months.  If my schedule is not open yet, we will contact you with a reminder closer to that time. Please call [phone] if you haven't heard from us  a month before, and always call us  sooner if issues or concerns arise. You can also send us  a message through MyChart, but but aware that this is not to be used for urgent issues and it may take up to 5-7 days to receive a reply. Please be aware that you will likely be able to view your results before I have a chance to respond to them. Please give us  5 business days to respond to any non-urgent results.    So glad to see that the breathing is improved with symbicort .  Continue 2 puffs twice daily, gargle after use.  Continue albuterol  inhaler as needed.  Breathing testing stable from last year.   Continue prilosec for reflux.  Small changes in the bottoms of your lungs are most likely related to micro-aspiration. I do not think they are causing you any problems.   Review the reflux precautions and dietary changes we discussed.    What is GERD? Gastroesophageal reflux disease (GERD) is gastroesophageal reflux diseasewhich occurs when the lower esophageal sphincter (LES) opens spontaneously, for varying periods of time, or does not close properly and stomach contents rise up into the esophagus. GER is also called acid reflux or acid regurgitation, because digestive juices--called acids--rise up with the food. The esophagus is the tube that carries food from the mouth to the stomach. The LES is a ring of muscle at the bottom of the esophagus that acts like a valve between the esophagus and stomach.  When acid reflux occurs, food or fluid can be tasted in the back of the mouth. When refluxed stomach acid touches the lining of the esophagus it may cause a burning sensation in the chest or throat called heartburn or acid indigestion. Occasional reflux is common. Persistent  reflux that occurs more than twice a week is considered GERD, and it can eventually lead to more serious health problems. People of all ages can have GERD. Studies have shown that GERD may worsen or contribute to asthma, chronic cough, and pulmonary fibrosis.   What are the symptoms of GERD? The main symptom of GERD in adults is frequent heartburn, also called acid indigestion--burning-type pain in the lower part of the mid-chest, behind the breast bone, and in the mid-abdomen.  Not all reflux is acidic in nature, and many patients don't have heart burn at all. Sometimes it feels like a cough (either dry or with mucus), choking sensation, asthma, shortness of breath, waking up at night, frequent throat clearing, or trouble swallowing.    What causes GERD? The reason some people develop GERD is still unclear. However, research shows that in people with GERD, the LES relaxes while the rest of the esophagus is working. Anatomical abnormalities such as a hiatal hernia may also contribute to GERD. A hiatal hernia occurs when the upper part of the stomach and the LES move above the diaphragm, the muscle wall that separates the stomach from the chest. Normally, the diaphragm helps the LES keep acid from rising up into the esophagus. When a hiatal hernia is present, acid reflux can occur more easily. A hiatal hernia can occur in people of any age and is most often a normal finding in otherwise healthy people over age 17.  Most of the time, a hiatal hernia produces no symptoms.   Other factors that may contribute to GERD include - Obesity or recent weight gain - Pregnancy  - Smoking  - Diet - Certain medications  Common foods that can worsen reflux symptoms include: - carbonated beverages - artificial sweeteners - citrus fruits  - chocolate  - drinks with caffeine or alcohol  - fatty and fried foods  - garlic and onions  - mint flavorings  - spicy foods  - tomato-based foods, like spaghetti sauce,  salsa, chili, and pizza   Lifestyle Changes If you smoke, stop.  Avoid foods and beverages that worsen symptoms (see above.) Lose weight if needed.  Eat small, frequent meals.  Wear loose-fitting clothes.  Avoid lying down for 3 hours after a meal.  Raise the head of your bed 6 to 8 inches by securing wood blocks under the bedposts. Just using extra pillows will not help, but using a wedge-shaped pillow may be helpful.  Medications  H2 blockers, such as cimetidine (Tagamet HB), famotidine (Pepcid AC), nizatidine (Axid AR), and ranitidine (Zantac 75), decrease acid production. They are available in prescription strength and over-the-counter strength. These drugs provide short-term relief and are effective for about half of those who have GERD symptoms.  Proton pump inhibitors include omeprazole  (Prilosec, Zegerid), lansoprazole (Prevacid), pantoprazole  (Protonix ), rabeprazole (Aciphex), and esomeprazole (Nexium), which are available by prescription. Prilosec is also available in over-the-counter strength. Proton pump inhibitors are more effective than H2 blockers and can relieve symptoms and heal the esophageal lining in almost everyone who has GERD.  Because drugs work in different ways, combinations of medications may help control symptoms. People who get heartburn after eating may take both antacids and H2 blockers. The antacids work first to neutralize the acid in the stomach, and then the H2 blockers act on acid production. By the time the antacid stops working, the H2 blocker will have stopped acid production. Your health care provider is the best source of information about how to use medications for GERD.   Points to Remember 1. You can have GERD without having heartburn. Your symptoms could include a dry cough, asthma symptoms, or trouble swallowing.  2. Taking medications daily as prescribed is important in controlling you symptoms.  Sometimes it can take up to 8 weeks to fully  achieve the effects of the medications prescribed.  3. Coughing related to GERD can be difficult to treat and is very frustrating!  However, it is important to stick with these medications and lifestyle modifications before pursuing more aggressive or invasive test and treatments.

## 2024-06-06 ENCOUNTER — Encounter: Payer: Self-pay | Admitting: Internal Medicine

## 2024-06-06 ENCOUNTER — Ambulatory Visit (INDEPENDENT_AMBULATORY_CARE_PROVIDER_SITE_OTHER): Admitting: Internal Medicine

## 2024-06-06 VITALS — BP 110/68 | Temp 98.7°F | Wt 159.8 lb

## 2024-06-06 DIAGNOSIS — J849 Interstitial pulmonary disease, unspecified: Secondary | ICD-10-CM

## 2024-06-06 DIAGNOSIS — M1711 Unilateral primary osteoarthritis, right knee: Secondary | ICD-10-CM

## 2024-06-06 DIAGNOSIS — E119 Type 2 diabetes mellitus without complications: Secondary | ICD-10-CM | POA: Diagnosis not present

## 2024-06-06 DIAGNOSIS — I10 Essential (primary) hypertension: Secondary | ICD-10-CM

## 2024-06-06 DIAGNOSIS — Z7984 Long term (current) use of oral hypoglycemic drugs: Secondary | ICD-10-CM

## 2024-06-06 LAB — POCT GLYCOSYLATED HEMOGLOBIN (HGB A1C): Hemoglobin A1C: 6.1 % — AB (ref 4.0–5.6)

## 2024-06-06 NOTE — Progress Notes (Signed)
 Established Patient Office Visit     CC/Reason for Visit: Follow up chronic conditions  HPI: Kristy [name] is a 83 y.o. female who is coming in today for the above mentioned reasons. Past Medical History is significant for: Hypertension, hyperlipidemia, type 2 diabetes.  Also has significant arthritis.  She was taken off Celebrex  and placed on Plaquenil.  She is otherwise doing well and has no concerns or complaints.   Past Medical/Surgical History: Past Medical History:  Diagnosis Date   Allergic rhinitis, seasonal    Arthritis    Asthma    bronchial with weather change   Diabetes mellitus without complication (HCC)    type 2    Dysrhythmia    Skipping a beat   GERD (gastroesophageal reflux disease)    History of colon polyps    Hypertension    Pneumonia    Rotator cuff tear, right     Past Surgical History:  Procedure Laterality Date   BACK SURGERY     x3  ruptured disc    CHOLECYSTECTOMY OPEN  AGE 72   EYE SURGERY     bil cataracts   HERNIA REPAIR     INGUINAL HERNIA REPAIR Right AGE 72s   IR KYPHO THORACIC WITH BONE BIOPSY  10/30/2022   IR RADIOLOGIST EVAL & MGMT  10/07/2022   KNEE ARTHROSCOPY Left 2014   LUMBAR DISC SURGERY  x2  LAST DATE EARLY 2000s   ORIF ANKLE FRACTURE Right 06/24/2018   Procedure: OPEN REDUCTION INTERNAL FIXATION (ORIF) RIGHT ANKLE FRACTURE;  Surgeon: Vernetta Lonni GRADE, MD;  Location: [facility];  Service: Orthopedics;  Laterality: Right;   ROTATOR CUFF REPAIR Left 01/2015   TONSILLECTOMY  AGE 83   TOTAL KNEE ARTHROPLASTY Right 12/09/2020   Procedure: TOTAL KNEE ARTHROPLASTY;  Surgeon: Melodi Lerner, MD;  Location: [facility];  Service: Orthopedics;  Laterality: Right;    VAGINAL HYSTERECTOMY  AGE 74   WITH BSO    Social History:  reports that she quit smoking about 35 years ago. Her smoking use included cigarettes. She started smoking about 45 years ago. She has never used smokeless tobacco. She reports current alcohol use of  about 3.0 - 4.0 standard drinks of alcohol per week. She reports that she does not use drugs.  Allergies: Allergies  Allergen Reactions   Clonidine Hives    Family History:  Family History  Problem Relation Age of Onset   Colon cancer Neg Hx    Esophageal cancer Neg Hx    Rectal cancer Neg Hx    Stomach cancer Neg Hx    Asthma Neg Hx      Current Outpatient Medications:    albuterol  (VENTOLIN  HFA) 108 (90 Base) MCG/ACT inhaler, TAKE 2 PUFFS BY MOUTH EVERY 6 HOURS AS NEEDED FOR WHEEZE, Disp: 18 each, Rfl: 9   alendronate (FOSAMAX) 70 MG tablet, Take 70 mg by mouth once a week. Take with a full glass of water  on an empty stomach., Disp: , Rfl:    amLODipine  (NORVASC ) 10 MG tablet, TAKE 1 TABLET BY MOUTH EVERY DAY, Disp: 90 tablet, Rfl: 1   budesonide -formoterol  (SYMBICORT ) 160-4.5 MCG/ACT inhaler, Inhale 2 puffs into the lungs in the morning and at bedtime., Disp: 30.6 each, Rfl: 11   ferrous sulfate  325 (65 FE) MG tablet, TAKE 1 TABLET BY MOUTH TWICE A DAY WITH FOOD, Disp: 100 tablet, Rfl: 1   hydrochlorothiazide  (HYDRODIURIL ) 12.5 MG tablet, TAKE 1 TABLET BY MOUTH EVERY DAY, Disp:  90 tablet, Rfl: 3   hydroxychloroquine (PLAQUENIL) 200 MG tablet, 1 tab Orally twice a day; Duration: 30 days, Disp: , Rfl:    losartan  (COZAAR ) 100 MG tablet, TAKE 1 TABLET BY MOUTH EVERY DAY, Disp: 90 tablet, Rfl: 1   metFORMIN  (GLUCOPHAGE ) 500 MG tablet, TAKE 1 TABLET BY MOUTH TWICE A DAY WITH FOOD, Disp: 180 tablet, Rfl: 1   metoprolol  succinate (TOPROL -XL) 50 MG 24 hr tablet, TAKE 1 TABLET BY MOUTH EVERY DAY, Disp: 90 tablet, Rfl: 3   omeprazole  (PRILOSEC) 20 MG capsule, TAKE 1 CAPSULE (20 MG TOTAL) BY MOUTH 2 (TWO) TIMES DAILY BEFORE A MEAL., Disp: 180 capsule, Rfl: 1   rosuvastatin  (CRESTOR ) 10 MG tablet, Take 1 tablet (10 mg total) by mouth at bedtime., Disp: 90 tablet, Rfl: 3   Spacer/Aero-Holding Chambers DEVI, Use with Symbicort  inhaler, Disp: 1 each, Rfl: 2   traMADol  (ULTRAM ) 50 MG tablet, Take  by mouth every 6 (six) hours as needed., Disp: , Rfl:    celecoxib  (CELEBREX ) 200 MG capsule, Take 200 mg by mouth daily. (Patient not taking: Reported on 06/05/2024), Disp: , Rfl:   Review of Systems:  Negative unless indicated in HPI.   Physical Exam: Vitals:   06/06/24 1111  BP: 110/68  Temp: 98.7 F (37.1 C)  TempSrc: Oral  Weight: 159 lb 12.8 oz (72.5 kg)    Body mass index is 30.19 kg/m.   Physical Exam Vitals reviewed.  Constitutional:      Appearance: Normal appearance.  HENT:     Head: Normocephalic and atraumatic.  Eyes:     Conjunctiva/sclera: Conjunctivae normal.  Cardiovascular:     Rate and Rhythm: Normal rate and regular rhythm.  Pulmonary:     Effort: Pulmonary effort is normal.     Breath sounds: Normal breath sounds.  Skin:    General: Skin is warm and dry.  Neurological:     General: No focal deficit present.     Mental Status: She is alert and oriented to person, place, and time.  Psychiatric:        Mood and Affect: Mood normal.        Behavior: Behavior normal.        Thought Content: Thought content normal.        Judgment: Judgment normal.      Impression and Plan:  Type 2 diabetes mellitus without complication, unspecified whether long term insulin  use (HCC) -     POCT glycosylated hemoglobin (Hb A1C)  Essential hypertension  ILD (interstitial lung disease) (HCC)  Primary osteoarthritis of right knee   - Blood pressure is well-controlled on current. - A1c of 6.1 demonstrates excellent diabetic management. - LDL is close to goal at 74. - Continue follow-up with rheumatology and pulmonology for her arthritis and interstitial lung disease.   Time spent:31 minutes reviewing chart, interviewing and examining patient and formulating plan of care.     Tully Theophilus Andrews, MD [facility]

## 2024-06-07 MED ORDER — METOPROLOL SUCCINATE ER 50 MG PO TB24
50.0000 mg | ORAL_TABLET | Freq: Every day | ORAL | 2 refills | Status: AC
Start: 2024-06-07 — End: ?

## 2024-06-20 ENCOUNTER — Other Ambulatory Visit: Payer: Self-pay | Admitting: Internal Medicine

## 2024-06-20 DIAGNOSIS — E1159 Type 2 diabetes mellitus with other circulatory complications: Secondary | ICD-10-CM

## 2024-07-15 ENCOUNTER — Other Ambulatory Visit: Payer: Self-pay | Admitting: Internal Medicine

## 2024-07-15 DIAGNOSIS — D649 Anemia, unspecified: Secondary | ICD-10-CM

## 2024-07-21 ENCOUNTER — Ambulatory Visit: Payer: Medicare Other

## 2024-07-21 VITALS — Ht 61.0 in | Wt 159.0 lb

## 2024-07-21 DIAGNOSIS — Z0000 Encounter for general adult medical examination without abnormal findings: Secondary | ICD-10-CM

## 2024-07-21 NOTE — Progress Notes (Signed)
 Subjective:   Kristy [name] is a 83 y.o. who presents for a Medicare Wellness preventive visit.  As a reminder, Annual Wellness Visits don't include a physical exam, and some assessments may be limited, especially if this visit is performed virtually. We may recommend an in-person follow-up visit with your provider if needed.  Visit Complete: Virtual I connected with  Kristy [name] on 07/21/24 by a audio enabled telemedicine application and verified that I am speaking with the correct person using two identifiers.  Patient Location: Home  Provider Location: Home Office  I discussed the limitations of evaluation and management by telemedicine. The patient expressed understanding and agreed to proceed.  Vital Signs: Because this visit was a virtual/telehealth visit, some criteria may be missing or patient reported. Any vitals not documented were not able to be obtained and vitals that have been documented are patient reported.    Persons Participating in Visit: Patient.  AWV Questionnaire: No: Patient Medicare AWV questionnaire was not completed prior to this visit.  Cardiac Risk Factors include: advanced age (>48men, >54 women);hypertension     Objective:    Today's Vitals   07/21/24 1342  Weight: 159 lb (72.1 kg)  Height: 5' 1 (1.549 m)   Body mass index is 30.04 kg/m.     07/21/2024    1:49 PM 07/19/2023    2:15 PM 10/30/2022    7:16 AM 09/05/2021    5:41 PM 09/03/2021   10:39 AM 05/15/2021    2:04 PM 12/09/2020    4:12 PM  Advanced Directives  Does Patient Have a Medical Advance Directive? Yes Yes Yes No No Yes No  Type of Estate agent of [place];Living will Healthcare Power of [place];Living will Healthcare Power of [place];Living will   Living will;Healthcare Power of Attorney   Does patient want to make changes to medical advance directive?   No - Patient declined   No - Patient declined   Copy of Healthcare Power of Attorney in Chart? No -  copy requested No - copy requested No - copy requested   No - copy requested   Would patient like information on creating a medical advance directive?    No - Patient declined   No - Patient declined    Current Medications (verified) Outpatient Encounter Medications as of 07/21/2024  Medication Sig   albuterol  (VENTOLIN  HFA) 108 (90 Base) MCG/ACT inhaler TAKE 2 PUFFS BY MOUTH EVERY 6 HOURS AS NEEDED FOR WHEEZE   alendronate (FOSAMAX) 70 MG tablet Take 70 mg by mouth once a week. Take with a full glass of water  on an empty stomach.   amLODipine  (NORVASC ) 10 MG tablet TAKE 1 TABLET BY MOUTH EVERY DAY   budesonide -formoterol  (SYMBICORT ) 160-4.5 MCG/ACT inhaler Inhale 2 puffs into the lungs in the morning and at bedtime.   celecoxib  (CELEBREX ) 200 MG capsule Take 200 mg by mouth daily. (Patient not taking: Reported on 06/05/2024)   ferrous sulfate  325 (65 FE) MG tablet TAKE 1 TABLET BY MOUTH TWICE A DAY WITH FOOD   hydrochlorothiazide  (HYDRODIURIL ) 12.5 MG tablet TAKE 1 TABLET BY MOUTH EVERY DAY   hydroxychloroquine (PLAQUENIL) 200 MG tablet 1 tab Orally twice a day; Duration: 30 days   losartan  (COZAAR ) 100 MG tablet TAKE 1 TABLET BY MOUTH EVERY DAY   metFORMIN  (GLUCOPHAGE ) 500 MG tablet TAKE 1 TABLET BY MOUTH TWICE A DAY WITH FOOD   metoprolol  succinate (TOPROL -XL) 50 MG 24 hr tablet Take 1 tablet (50 mg total) by  mouth daily.   omeprazole  (PRILOSEC) 20 MG capsule TAKE 1 CAPSULE (20 MG TOTAL) BY MOUTH 2 (TWO) TIMES DAILY BEFORE A MEAL.   rosuvastatin  (CRESTOR ) 10 MG tablet Take 1 tablet (10 mg total) by mouth at bedtime.   Spacer/Aero-Holding Raguel FRENCH Use with Symbicort  inhaler   traMADol  (ULTRAM ) 50 MG tablet Take by mouth every 6 (six) hours as needed. (Patient not taking: Reported on 07/21/2024)   No facility-administered encounter medications on file as of 07/21/2024.    Allergies (verified) Clonidine   History: Past Medical History:  Diagnosis Date   Allergic rhinitis, seasonal     Arthritis    Asthma    bronchial with weather change   Diabetes mellitus without complication (HCC)    type 2    Dysrhythmia    Skipping a beat   GERD (gastroesophageal reflux disease)    History of colon polyps    Hypertension    Pneumonia    Rotator cuff tear, right    Past Surgical History:  Procedure Laterality Date   BACK SURGERY     x3  ruptured disc    CHOLECYSTECTOMY OPEN  AGE 13   EYE SURGERY     bil cataracts   HERNIA REPAIR     INGUINAL HERNIA REPAIR Right AGE 13s   IR KYPHO THORACIC WITH BONE BIOPSY  10/30/2022   IR RADIOLOGIST EVAL & MGMT  10/07/2022   KNEE ARTHROSCOPY Left 2014   LUMBAR DISC SURGERY  x2  LAST DATE EARLY 2000s   ORIF ANKLE FRACTURE Right 06/24/2018   Procedure: OPEN REDUCTION INTERNAL FIXATION (ORIF) RIGHT ANKLE FRACTURE;  Surgeon: Vernetta Lonni GRADE, MD;  Location: [facility];  Service: Orthopedics;  Laterality: Right;   ROTATOR CUFF REPAIR Left 01/2015   TONSILLECTOMY  AGE 9   TOTAL KNEE ARTHROPLASTY Right 12/09/2020   Procedure: TOTAL KNEE ARTHROPLASTY;  Surgeon: Melodi Lerner, MD;  Location: [facility];  Service: Orthopedics;  Laterality: Right;    VAGINAL HYSTERECTOMY  AGE 20   WITH BSO   Family History  Problem Relation Age of Onset   Colon cancer Neg Hx    Esophageal cancer Neg Hx    Rectal cancer Neg Hx    Stomach cancer Neg Hx    Asthma Neg Hx    Social History   Socioeconomic History   Marital status: Widowed    Spouse name: Not on file   Number of children: 4   Years of education: Not on file   Highest education level: Associate degree: academic program  Occupational History   Occupation: retired  Tobacco Use   Smoking status: Former    Current packs/day: 0.00    Types: Cigarettes    Start date: 10/19/1978    Quit date: 10/19/1988    Years since quitting: 35.7   Smokeless tobacco: Never  Vaping Use   Vaping status: Never Used  Substance and Sexual Activity   Alcohol use: Yes    Alcohol/week: 3.0 - 4.0 standard  drinks of alcohol    Types: 3 - 4 Glasses of wine per week    Comment: 2-3 times a week   Drug use: Never   Sexual activity: Not Currently  Other Topics Concern   Not on file  Social History Narrative   Not on file   Social Drivers of Health   Financial Resource Strain: Low Risk  (07/21/2024)   Overall Financial Resource Strain (CARDIA)    Difficulty of Paying Living Expenses: Not hard at all  Food Insecurity: No Food Insecurity (07/21/2024)   Hunger Vital Sign    Worried About Running Out of Food in the Last Year: Never true    Ran Out of Food in the Last Year: Never true  Transportation Needs: No Transportation Needs (07/21/2024)   PRAPARE - Administrator, Civil Service (Medical): No    Lack of Transportation (Non-Medical): No  Physical Activity: Insufficiently Active (07/21/2024)   Exercise Vital Sign    Days of Exercise per Week: 3 days    Minutes of Exercise per Session: 30 min  Stress: No Stress Concern Present (07/21/2024)   [employer] of Occupational Health - Occupational Stress Questionnaire    Feeling of Stress: Not at all  Social Connections: Moderately Integrated (07/21/2024)   Social Connection and Isolation Panel    Frequency of Communication with Friends and Family: More than three times a week    Frequency of Social Gatherings with Friends and Family: More than three times a week    Attends Religious Services: More than 4 times per year    Active Member of [employer] or Organizations: Yes    Attends Banker Meetings: More than 4 times per year    Marital Status: Widowed    Tobacco Counseling Counseling given: Not Answered    Clinical Intake:  Pre-visit preparation completed: Yes  Pain : No/denies pain     BMI - recorded: 30.04 Nutritional Status: BMI > 30  Obese Nutritional Risks: None Diabetes: No  Lab Results  Component Value Date   HGBA1C 6.1 (A) 06/06/2024   HGBA1C 6.2 (A) 03/06/2024   HGBA1C 5.9 (A) 12/07/2023      How often do you need to have someone help you when you read instructions, pamphlets, or other written materials from your doctor or pharmacy?: 1 - Never  Interpreter Needed?: No  Information entered by :: Kristy Blush LPN   Activities of Daily Living     07/21/2024    1:47 PM  In your present state of health, do you have any difficulty performing the following activities:  Hearing? 0  Vision? 0  Difficulty concentrating or making decisions? 0  Walking or climbing stairs? 0  Dressing or bathing? 0  Doing errands, shopping? 0  Preparing Food and eating ? N  Using the Toilet? N  In the past six months, have you accidently leaked urine? N  Do you have problems with loss of bowel control? N  Managing your Medications? N  Managing your Finances? N  Housekeeping or managing your Housekeeping? N    Patient Care Team: Theophilus Andrews, Tully GRADE, MD as PCP - General (Internal Medicine) Burnard Debby LABOR, MD (Inactive) as PCP - Cardiology (Cardiology)  I have updated your Care Teams any recent Medical Services you may have received from other providers in the past year.     Assessment:   This is a routine wellness examination for [place].  Hearing/Vision screen Hearing Screening - Comments:: Denies hearing difficulties   Vision Screening - Comments:: Wears rx glasses - up to date with routine eye exams with  Dr Patrcia   Goals Addressed               This Visit's Progress     Remain active. (pt-stated)         Depression Screen     07/21/2024    1:47 PM 03/06/2024   11:30 AM 12/07/2023   11:24 AM 09/02/2023    8:37 AM 07/19/2023  2:13 PM 05/03/2023   10:50 AM 03/22/2023   10:11 AM  PHQ 2/9 Scores  PHQ - 2 Score 0 0 0 0 0 0   PHQ- 9 Score  0  0  0   Exception Documentation       Patient refusal    Fall Risk     07/21/2024    1:48 PM 03/29/2024   11:55 AM 03/06/2024   11:30 AM 12/07/2023   11:24 AM 09/02/2023    8:37 AM  Fall Risk   Falls in the past year? 1 0  0 0 1  Number falls in past yr: 0  0 0 0  Injury with Fall? 0  0 0 0  Comment Followed by medical attention      Risk for fall due to : No Fall Risks      Follow up Falls evaluation completed  Falls evaluation completed Falls evaluation completed Falls evaluation completed    MEDICARE RISK AT HOME:  Medicare Risk at Home Any stairs in or around the home?: Yes If so, are there any without handrails?: No Home free of loose throw rugs in walkways, pet beds, electrical cords, etc?: Yes Adequate lighting in your home to reduce risk of falls?: Yes Life alert?: No Use of a cane, walker or w/c?: No Grab bars in the bathroom?: Yes Shower chair or bench in shower?: No Elevated toilet seat or a handicapped toilet?: No  TIMED UP AND GO:  Was the test performed?  No  Cognitive Function: 6CIT completed        07/21/2024    1:49 PM 07/19/2023    2:15 PM  6CIT Screen  What Year? 0 points 0 points  What month? 0 points 0 points  What time? 0 points 0 points  Count back from 20 0 points 0 points  Months in reverse 0 points 0 points  Repeat phrase 0 points 0 points  Total Score 0 points 0 points    Immunizations Immunization History  Administered Date(s) Administered   Fluad Quad(high Dose 65+) 07/05/2019, 08/22/2020, 07/13/2022   INFLUENZA, HIGH DOSE SEASONAL PF 10/04/2015, 08/05/2016, 09/14/2017, 09/08/2018, 06/28/2023   Influenza Split 09/23/2011, 09/09/2013   Influenza Whole 10/04/2007, 08/07/2008, 09/16/2009   Influenza,inj,Quad PF,6+ Mos 08/07/2014   Influenza,inj,quad, With Preservative 02/05/2021   Influenza-Unspecified 08/05/2021   Moderna Covid-19 Fall Seasonal Vaccine 64yrs & older 06/28/2023, 02/01/2024   Moderna SARS-COV2 Booster Vaccination 02/20/2023   Moderna Sars-Covid-2 Vaccination 12/17/2019   PFIZER Comirnaty(Gray Top)Covid-19 Tri-Sucrose Vaccine 02/05/2021, 07/13/2022   PFIZER(Purple Top)SARS-COV-2 Vaccination 11/07/2019, 11/28/2019, 07/18/2020   PNEUMOCOCCAL  CONJUGATE-20 02/05/2021, 07/13/2022   Pfizer Covid-19 Vaccine Bivalent Booster 72yrs & up 08/05/2021   Pneumococcal Conjugate-13 10/09/2013   Pneumococcal Polysaccharide-23 04/03/2016   Respiratory Syncytial Virus Vaccine,Recomb Aduvanted(Arexvy) 06/28/2023   Tetanus 10/09/2013    Screening Tests Health Maintenance  Topic Date Due   Zoster Vaccines- Shingrix (1 of 2) Never done   DTaP/Tdap/Td (1 - Tdap) 10/10/2013   Influenza Vaccine  05/19/2024   COVID-19 Vaccine (10 - 2025-26 season) 06/19/2024   HEMOGLOBIN A1C  12/07/2024   Diabetic kidney evaluation - Urine ACR  03/06/2025   FOOT EXAM  03/06/2025   Mammogram  04/10/2025   OPHTHALMOLOGY EXAM  05/02/2025   Diabetic kidney evaluation - eGFR measurement  05/30/2025   Medicare Annual Wellness (AWV)  07/21/2025   Pneumococcal Vaccine: 50+ Years  Completed   DEXA SCAN  Completed   HPV VACCINES  Aged Out   Meningococcal B Vaccine  Aged Out   Colonoscopy  Discontinued    Health Maintenance Items Addressed:   Additional Screening:  Vision Screening: Recommended annual ophthalmology exams for early detection of glaucoma and other disorders of the eye. Is the patient up to date with their annual eye exam?  Yes  Who is the provider or what is the name of the office in which the patient attends annual eye exams? Dr Patrcia  Dental Screening: Recommended annual dental exams for proper oral hygiene  Community Resource Referral / Chronic Care Management: CRR required this visit?  No   CCM required this visit?  No   Plan:    I have personally reviewed and noted the following in the patient's chart:   Medical and social history Use of alcohol, tobacco or illicit drugs  Current medications and supplements including opioid prescriptions. Patient is not currently taking opioid prescriptions. Functional ability and status Nutritional status Physical activity Advanced directives List of other physicians Hospitalizations,  surgeries, and ER visits in previous 12 months Vitals Screenings to include cognitive, depression, and falls Referrals and appointments  In addition, I have reviewed and discussed with patient certain preventive protocols, quality metrics, and best practice recommendations. A written personalized care plan for preventive services as well as general preventive health recommendations were provided to patient.   Kristy LELON Blush, LPN   [id number]   After Visit Summary: (MyChart) Due to this being a telephonic visit, the after visit summary with patients personalized plan was offered to patient via MyChart   Notes: Nothing significant to report at this time.

## 2024-07-21 NOTE — Patient Instructions (Addendum)
 Kristy [name],  Thank you for taking the time for your Medicare Wellness Visit. I appreciate your continued commitment to your health goals. Please review the care plan we discussed, and feel free to reach out if I can assist you further.  Medicare recommends these wellness visits once per year to help you and your care team stay ahead of potential health issues. These visits are designed to focus on prevention, allowing your provider to concentrate on managing your acute and chronic conditions during your regular appointments.  Please note that Annual Wellness Visits do not include a physical exam. Some assessments may be limited, especially if the visit was conducted virtually. If needed, we may recommend a separate in-person follow-up with your provider.  Ongoing Care Seeing your primary care provider every 3 to 6 months helps us  monitor your health and provide consistent, personalized care.   Referrals If a referral was made during today's visit and you haven't received any updates within two weeks, please contact the referred provider directly to check on the status.  Recommended Screenings:  Health Maintenance  Topic Date Due   Zoster (Shingles) Vaccine (1 of 2) Never done   DTaP/Tdap/Td vaccine (1 - Tdap) 10/10/2013   Flu Shot  05/19/2024   COVID-19 Vaccine (10 - 2025-26 season) 06/19/2024   Hemoglobin A1C  12/07/2024   Yearly kidney health urinalysis for diabetes  03/06/2025   Complete foot exam   03/06/2025   Breast Cancer Screening  04/10/2025   Eye exam for diabetics  05/02/2025   Yearly kidney function blood test for diabetes  05/30/2025   Medicare Annual Wellness Visit  07/21/2025   Pneumococcal Vaccine for age over 56  Completed   DEXA scan (bone density measurement)  Completed   HPV Vaccine  Aged Out   Meningitis B Vaccine  Aged Out   Colon Cancer Screening  Discontinued       07/21/2024    1:49 PM  Advanced Directives  Does Patient Have a Medical Advance Directive?  Yes  Type of Estate agent of [place];Living will  Copy of Healthcare Power of Attorney in Chart? No - copy requested   Advance Care Planning is important because it: Ensures you receive medical care that aligns with your values, goals, and preferences. Provides guidance to your family and loved ones, reducing the emotional burden of decision-making during critical moments.  Vision: Annual vision screenings are recommended for early detection of glaucoma, cataracts, and diabetic retinopathy. These exams can also reveal signs of chronic conditions such as diabetes and high blood pressure.  Dental: Annual dental screenings help detect early signs of oral cancer, gum disease, and other conditions linked to overall health, including heart disease and diabetes.  Please see the attached documents for additional preventive care recommendations.

## 2024-07-25 DIAGNOSIS — Z23 Encounter for immunization: Secondary | ICD-10-CM | POA: Diagnosis not present

## 2024-08-03 ENCOUNTER — Other Ambulatory Visit: Payer: Self-pay

## 2024-08-03 ENCOUNTER — Emergency Department (HOSPITAL_BASED_OUTPATIENT_CLINIC_OR_DEPARTMENT_OTHER)
Admission: EM | Admit: 2024-08-03 | Discharge: 2024-08-03 | Disposition: A | Discharge status: Home / Self Care | Source: Ambulatory Visit | Attending: Emergency Medicine | Admitting: Emergency Medicine

## 2024-08-03 ENCOUNTER — Emergency Department (HOSPITAL_BASED_OUTPATIENT_CLINIC_OR_DEPARTMENT_OTHER)

## 2024-08-03 DIAGNOSIS — M25531 Pain in right wrist: Secondary | ICD-10-CM | POA: Diagnosis not present

## 2024-08-03 DIAGNOSIS — M7918 Myalgia, other site: Secondary | ICD-10-CM

## 2024-08-03 DIAGNOSIS — Z7984 Long term (current) use of oral hypoglycemic drugs: Secondary | ICD-10-CM | POA: Insufficient documentation

## 2024-08-03 DIAGNOSIS — W01198A Fall on same level from slipping, tripping and stumbling with subsequent striking against other object, initial encounter: Secondary | ICD-10-CM | POA: Insufficient documentation

## 2024-08-03 DIAGNOSIS — Z7951 Long term (current) use of inhaled steroids: Secondary | ICD-10-CM | POA: Diagnosis not present

## 2024-08-03 DIAGNOSIS — E119 Type 2 diabetes mellitus without complications: Secondary | ICD-10-CM | POA: Diagnosis not present

## 2024-08-03 DIAGNOSIS — I10 Essential (primary) hypertension: Secondary | ICD-10-CM | POA: Insufficient documentation

## 2024-08-03 DIAGNOSIS — S4991XA Unspecified injury of right shoulder and upper arm, initial encounter: Secondary | ICD-10-CM | POA: Insufficient documentation

## 2024-08-03 DIAGNOSIS — M19011 Primary osteoarthritis, right shoulder: Secondary | ICD-10-CM | POA: Diagnosis not present

## 2024-08-03 DIAGNOSIS — J45909 Unspecified asthma, uncomplicated: Secondary | ICD-10-CM | POA: Insufficient documentation

## 2024-08-03 DIAGNOSIS — Z79899 Other long term (current) drug therapy: Secondary | ICD-10-CM | POA: Insufficient documentation

## 2024-08-03 DIAGNOSIS — M791 Myalgia, unspecified site: Secondary | ICD-10-CM | POA: Diagnosis not present

## 2024-08-03 DIAGNOSIS — M25511 Pain in right shoulder: Secondary | ICD-10-CM | POA: Diagnosis not present

## 2024-08-03 DIAGNOSIS — S0990XA Unspecified injury of head, initial encounter: Secondary | ICD-10-CM | POA: Diagnosis not present

## 2024-08-03 DIAGNOSIS — W19XXXA Unspecified fall, initial encounter: Secondary | ICD-10-CM

## 2024-08-03 MED ORDER — LIDOCAINE 5 % EX PTCH: 1.0000 | MEDICATED_PATCH | CUTANEOUS | 0 refills | Status: AC

## 2024-08-03 MED ORDER — TRAMADOL HCL 50 MG PO TABS: 50.0000 mg | ORAL_TABLET | Freq: Four times a day (QID) | ORAL | 0 refills | Status: AC | PRN

## 2024-08-03 NOTE — ED Triage Notes (Signed)
 Pt caox4 ambulatory reporting she went to [facility] this morning for fall, was told to come to ED for CT R shoulder and CT head d/t pt hitting her head when she fell. Denies LOC. C/o pain in L wrist and L shoulder since fall.

## 2024-08-03 NOTE — ED Provider Notes (Signed)
 [place] EMERGENCY DEPARTMENT AT [facility] Provider Note   CSN: [id number] Arrival date & time: 08/03/24  1411     Patient presents with: Kristy [name] is a 83 y.o. female.   The history is provided by the patient and medical records. No language interpreter was used.  Fall This is a new problem. The current episode started yesterday. The problem has not changed since onset.Pertinent negatives include no chest pain, no abdominal pain, no headaches (resolved) and no shortness of breath. Nothing aggravates the symptoms. Nothing relieves the symptoms. She has tried nothing for the symptoms. The treatment provided no relief.       Prior to Admission medications   Medication Sig Start Date End Date Taking? Authorizing Provider  albuterol  (VENTOLIN  HFA) 108 (90 Base) MCG/ACT inhaler TAKE 2 PUFFS BY MOUTH EVERY 6 HOURS AS NEEDED FOR WHEEZE 01/11/24   Parrett, Tammy S, NP  alendronate (FOSAMAX) 70 MG tablet Take 70 mg by mouth once a week. Take with a full glass of water  on an empty stomach.    [provider]  amLODipine  (NORVASC ) 10 MG tablet TAKE 1 TABLET BY MOUTH EVERY DAY 06/05/24   Theophilus Andrews, Tully GRADE, MD  budesonide -formoterol  (SYMBICORT ) 160-4.5 MCG/ACT inhaler Inhale 2 puffs into the lungs in the morning and at bedtime. 03/29/24   Meade Verdon RAMAN, MD  celecoxib  (CELEBREX ) 200 MG capsule Take 200 mg by mouth daily. Patient not taking: Reported on 06/05/2024    [provider]  ferrous sulfate  325 (65 FE) MG tablet TAKE 1 TABLET BY MOUTH TWICE A DAY WITH FOOD 07/17/24   Theophilus Andrews, Tully GRADE, MD  hydrochlorothiazide  (HYDRODIURIL ) 12.5 MG tablet TAKE 1 TABLET BY MOUTH EVERY DAY 12/06/23   Burnard Debby LABOR, MD  hydroxychloroquine (PLAQUENIL) 200 MG tablet 1 tab Orally twice a day; Duration: 30 days 05/29/24   [provider]  losartan  (COZAAR ) 100 MG tablet TAKE 1 TABLET BY MOUTH EVERY DAY 06/20/24   Theophilus Andrews, Tully GRADE, MD   metFORMIN  (GLUCOPHAGE ) 500 MG tablet TAKE 1 TABLET BY MOUTH TWICE A DAY WITH FOOD 10/07/23   Theophilus Andrews, Tully GRADE, MD  metoprolol  succinate (TOPROL -XL) 50 MG 24 hr tablet Take 1 tablet (50 mg total) by mouth daily. 06/07/24   Raford Riggs, MD  omeprazole  (PRILOSEC) 20 MG capsule TAKE 1 CAPSULE (20 MG TOTAL) BY MOUTH 2 (TWO) TIMES DAILY BEFORE A MEAL. 05/22/24   Abran Norleen SAILOR, MD  rosuvastatin  (CRESTOR ) 10 MG tablet Take 1 tablet (10 mg total) by mouth at bedtime. 12/07/23 06/06/24  Burnard Debby LABOR, MD  Spacer/Aero-Holding Raguel FRENCH Use with Symbicort  inhaler 10/06/22   Cobb, Comer GAILS, NP  traMADol  (ULTRAM ) 50 MG tablet Take by mouth every 6 (six) hours as needed. Patient not taking: Reported on 07/21/2024    [provider]    Allergies: Clonidine    Review of Systems  Constitutional:  Negative for chills and fatigue.  HENT:  Negative for congestion.   Eyes:  Negative for visual disturbance.  Respiratory:  Negative for cough, chest tightness and shortness of breath.   Cardiovascular:  Negative for chest pain.  Gastrointestinal:  Negative for abdominal pain, constipation, diarrhea, nausea and vomiting.  Genitourinary:  Negative for dysuria.  Musculoskeletal:  Negative for back pain and neck stiffness.  Skin:  Negative for rash and wound.  Neurological:  Negative for speech difficulty, weakness, light-headedness, numbness and headaches (resolved).  Psychiatric/Behavioral:  Negative for agitation.  All other systems reviewed and are negative.   Updated Vital Signs BP (!) 154/67   Pulse 72   Temp 97.8 F (36.6 C)   Resp 18   SpO2 96%   Physical Exam Vitals and nursing note reviewed.  Constitutional:      General: She is not in acute distress.    Appearance: She is well-developed. She is not ill-appearing, toxic-appearing or diaphoretic.  HENT:     Head: Normocephalic and atraumatic.     Right Ear: External ear normal.     Left Ear: External ear normal.      Nose: Nose normal.     Mouth/Throat:     Mouth: Mucous membranes are moist.     Pharynx: No oropharyngeal exudate.  Eyes:     Conjunctiva/sclera: Conjunctivae normal.     Pupils: Pupils are equal, round, and reactive to light.  Cardiovascular:     Rate and Rhythm: Normal rate.     Heart sounds: No murmur heard. Pulmonary:     Effort: No respiratory distress.     Breath sounds: No stridor.  Abdominal:     General: There is no distension.     Tenderness: There is no abdominal tenderness. There is no rebound.  Musculoskeletal:        General: Tenderness present.     Cervical back: Normal range of motion and neck supple. No tenderness.  Skin:    General: Skin is warm.     Capillary Refill: Capillary refill takes less than 2 seconds.     Findings: No erythema or rash.  Neurological:     General: No focal deficit present.     Mental Status: She is alert and oriented to person, place, and time.     Sensory: No sensory deficit.     Motor: No weakness or abnormal muscle tone.     Deep Tendon Reflexes: Reflexes are normal and symmetric.     (all labs ordered are listed, but only abnormal results are displayed) Labs Reviewed - No data to display  EKG: None  Radiology: CT Head Wo Contrast Result Date: 08/03/2024 CLINICAL DATA:  Fall.  Head trauma.  Hit head. EXAM: CT HEAD WITHOUT CONTRAST TECHNIQUE: Contiguous axial images were obtained from the base of the skull through the vertex without intravenous contrast. RADIATION DOSE REDUCTION: This exam was performed according to the departmental dose-optimization program which includes automated exposure control, adjustment of the mA and/or kV according to patient size and/or use of iterative reconstruction technique. COMPARISON:  05/07/2024 FINDINGS: Brain: No acute intracranial abnormality. Specifically, no hemorrhage, hydrocephalus, mass lesion, acute infarction, or significant intracranial injury. There is atrophy and chronic small  vessel disease changes. Vascular: No hyperdense vessel or unexpected calcification. Skull: No acute calvarial abnormality. Sinuses/Orbits: No acute findings Other: None IMPRESSION: Atrophy, chronic microvascular disease. No acute intracranial abnormality. Electronically Signed   By: Franky Crease M.D.   On: 08/03/2024 18:33   CT Shoulder Right Wo Contrast Result Date: 08/03/2024 CLINICAL DATA:  Fall.  Shoulder pain. EXAM: CT OF THE UPPER RIGHT EXTREMITY WITHOUT CONTRAST TECHNIQUE: Multidetector CT imaging of the upper right extremity was performed according to the standard protocol. RADIATION DOSE REDUCTION: This exam was performed according to the departmental dose-optimization program which includes automated exposure control, adjustment of the mA and/or kV according to patient size and/or use of iterative reconstruction technique. COMPARISON:  None Available. FINDINGS: Bones/Joint/Cartilage No acute bony abnormality. No fracture, subluxation or dislocation. Degenerative changes in the [facility] and glenohumeral  joints with joint space narrowing and spurring. Ligaments Suboptimally assessed by CT. Muscles and Tendons Negative Soft tissues Negative IMPRESSION: Degenerative changes in the [facility] and glenohumeral joints. No acute bony abnormality. Electronically Signed   By: Franky Crease M.D.   On: 08/03/2024 18:32     Procedures   Medications Ordered in the ED - No data to display                                  Medical Decision Making Amount and/or Complexity of Data Reviewed Radiology: ordered.    QUERIDA BERETTA is a 83 y.o. female with a past medical history significant for hypertension, asthma, GERD, and diabetes who presents from her orthopedics office to get CT imaging of her head and shoulder to look for significant injuries.  According to patient, she had a fall last night tripping over a rug and hit her right forehead and her right shoulder on the ground.  She did not lose consciousness and only  had headache for a little while before it resolved.  She went to her orthopedist office with [facility] and had x-rays that did not show fracture but she was seen here due to the continued shoulder pain to rule out occult shoulder fracture.  Otherwise she is denying complaints including denying headache, Neck pain, vision changes, neurologic complaints, nausea, vomiting, abdominal pain, low back pain, or lower extremity pains that are new.  On exam, lungs clear.  Chest nontender.  Abdomen nontender.  Back nontender.  She has tenderness in her right shoulder that is in a sling.  She could wiggle her fingers and had sensation in her fingers.  Midline neck nontender.  Forehead nontender.  No large hematoma or laceration seen.  No focal neurologic deficits.  Patient has CT of the shoulder and CT of the head as recommended by orthopedics and both are reassuring.  We discussed the possibility of nonbony injury and agree with plan to keep her in the sling and have her follow-up with her orthopedics team.  We agreed to hold on extensive lab or imaging workup as this was a mechanical fall and she is feeling better now.  She does not want further lab or imaging workup at this time.  We also offered CT of the neck given the pain from her shoulder slightly going to her lateral neck but she does not want it at this time.  She thinks is more musculoskeletal.  Will give her prescription for some pain medicine that she has tolerated before as she reports she is taking Ultram  many times and did well with it and we will also give her posterior Lidoderm  patches.  She will follow-up with orthopedics team to head with questions or concerns.  Patient discharged in good condition.      Final diagnoses:  Fall, initial encounter  Musculoskeletal pain    ED Discharge Orders          Ordered    traMADol  (ULTRAM ) 50 MG tablet  Every 6 hours PRN        08/03/24 1856    lidocaine  (LIDODERM ) 5 %  Every 24 hours         08/03/24 1856            Clinical Impression: 1. Fall, initial encounter   2. Musculoskeletal pain     Disposition: Discharge  Condition: Good  I have discussed the results, Dx and  Tx plan with the pt(& family if present). He/she/they expressed understanding and agree(s) with the plan. Discharge instructions discussed at great length. Strict return precautions discussed and pt &/or family have verbalized understanding of the instructions. No further questions at time of discharge.    New Prescriptions   LIDOCAINE  (LIDODERM ) 5 %    Place 1 patch onto the skin daily. Remove & Discard patch within 12 hours or as directed by MD   TRAMADOL  (ULTRAM ) 50 MG TABLET    Take 1 tablet (50 mg total) by mouth every 6 (six) hours as needed.    Follow Up: your orthopedics team     [facility] Emergency Department at [facility] [address] [place]  [id number] [phone]        Kiante Petrovich, Lonni PARAS, MD 08/03/24 [phone]

## 2024-08-03 NOTE — Discharge Instructions (Signed)
 Your history, exam, and imaging today did not show evidence of acute shoulder fracture or dislocation.  The CT of your head did not show skull fracture or intracranial bleeding.  We had a shared decision making conversation and agreed to hold on extensive lab or other imaging workup at this time given her well appearance and improving symptoms.  Please follow-up with your orthopedics team to discuss further management and workup.  If any symptoms change or worsen acutely, return to the nearest emergency department.  Please be careful during the pain medicine that you have tolerated the past and use some of the numbing patches as well.

## 2024-08-16 ENCOUNTER — Ambulatory Visit: Admitting: Internal Medicine

## 2024-08-17 DIAGNOSIS — M25511 Pain in right shoulder: Secondary | ICD-10-CM | POA: Diagnosis not present

## 2024-08-17 DIAGNOSIS — S62352D Nondisplaced fracture of shaft of third metacarpal bone, right hand, subsequent encounter for fracture with routine healing: Secondary | ICD-10-CM | POA: Diagnosis not present

## 2024-08-17 DIAGNOSIS — S62324D Displaced fracture of shaft of fourth metacarpal bone, right hand, subsequent encounter for fracture with routine healing: Secondary | ICD-10-CM | POA: Diagnosis not present

## 2024-08-17 DIAGNOSIS — M25531 Pain in right wrist: Secondary | ICD-10-CM | POA: Diagnosis not present

## 2024-08-18 ENCOUNTER — Encounter

## 2024-08-21 ENCOUNTER — Ambulatory Visit: Admitting: Internal Medicine

## 2024-08-24 DIAGNOSIS — M79641 Pain in right hand: Secondary | ICD-10-CM | POA: Diagnosis not present

## 2024-08-29 DIAGNOSIS — Z79899 Other long term (current) drug therapy: Secondary | ICD-10-CM | POA: Diagnosis not present

## 2024-08-29 DIAGNOSIS — E663 Overweight: Secondary | ICD-10-CM | POA: Diagnosis not present

## 2024-08-29 DIAGNOSIS — M810 Age-related osteoporosis without current pathological fracture: Secondary | ICD-10-CM | POA: Diagnosis not present

## 2024-08-29 DIAGNOSIS — M545 Low back pain, unspecified: Secondary | ICD-10-CM | POA: Diagnosis not present

## 2024-08-29 DIAGNOSIS — M154 Erosive (osteo)arthritis: Secondary | ICD-10-CM | POA: Diagnosis not present

## 2024-08-29 DIAGNOSIS — R7989 Other specified abnormal findings of blood chemistry: Secondary | ICD-10-CM | POA: Diagnosis not present

## 2024-08-29 DIAGNOSIS — Z6828 Body mass index (BMI) 28.0-28.9, adult: Secondary | ICD-10-CM | POA: Diagnosis not present

## 2024-08-31 ENCOUNTER — Ambulatory Visit: Admitting: Internal Medicine

## 2024-08-31 ENCOUNTER — Encounter: Payer: Self-pay | Admitting: Internal Medicine

## 2024-08-31 VITALS — BP 128/78 | HR 67 | Temp 98.3°F | Wt 156.5 lb

## 2024-08-31 DIAGNOSIS — E119 Type 2 diabetes mellitus without complications: Secondary | ICD-10-CM

## 2024-08-31 DIAGNOSIS — M25511 Pain in right shoulder: Secondary | ICD-10-CM | POA: Diagnosis not present

## 2024-08-31 DIAGNOSIS — M25531 Pain in right wrist: Secondary | ICD-10-CM | POA: Diagnosis not present

## 2024-08-31 DIAGNOSIS — E785 Hyperlipidemia, unspecified: Secondary | ICD-10-CM

## 2024-08-31 DIAGNOSIS — E1169 Type 2 diabetes mellitus with other specified complication: Secondary | ICD-10-CM | POA: Diagnosis not present

## 2024-08-31 DIAGNOSIS — I10 Essential (primary) hypertension: Secondary | ICD-10-CM

## 2024-08-31 LAB — POCT GLYCOSYLATED HEMOGLOBIN (HGB A1C): Hemoglobin A1C: 5.8 % — AB (ref 4.0–5.6)

## 2024-08-31 NOTE — Assessment & Plan Note (Signed)
A1c demonstrates excellent control at 5.8.

## 2024-08-31 NOTE — Assessment & Plan Note (Signed)
 Last LDL 74, slightly above goal but likely acceptable for age.

## 2024-08-31 NOTE — Assessment & Plan Note (Signed)
Well-controlled.  She is on amlodipine 10 mg and losartan 100 mg daily.

## 2024-08-31 NOTE — Progress Notes (Signed)
 Established Patient Office Visit     CC/Reason for Visit: Follow-up chronic medical conditions  HPI: Kristy [name] is a 83 y.o. female who is coming in today for the above mentioned reasons. Past Medical History is significant for: Hypertension, hyperlipidemia, type 2 diabetes.  Since I last saw her she suffered a fall and injured her hand and wrist.  She is in a sling and being followed by orthopedics, is starting OT soon.  Otherwise feeling well.   Past Medical/Surgical History: Past Medical History:  Diagnosis Date   Allergic rhinitis, seasonal    Arthritis    Asthma    bronchial with weather change   Diabetes mellitus without complication (HCC)    type 2    Dysrhythmia    Skipping a beat   GERD (gastroesophageal reflux disease)    History of colon polyps    Hypertension    Pneumonia    Rotator cuff tear, right     Past Surgical History:  Procedure Laterality Date   BACK SURGERY     x3  ruptured disc    CHOLECYSTECTOMY OPEN  AGE 27   EYE SURGERY     bil cataracts   HERNIA REPAIR     INGUINAL HERNIA REPAIR Right AGE 27s   IR KYPHO THORACIC WITH BONE BIOPSY  10/30/2022   IR RADIOLOGIST EVAL & MGMT  10/07/2022   KNEE ARTHROSCOPY Left 2014   LUMBAR DISC SURGERY  x2  LAST DATE EARLY 2000s   ORIF ANKLE FRACTURE Right 06/24/2018   Procedure: OPEN REDUCTION INTERNAL FIXATION (ORIF) RIGHT ANKLE FRACTURE;  Surgeon: Vernetta Lonni GRADE, MD;  Location: [facility];  Service: Orthopedics;  Laterality: Right;   ROTATOR CUFF REPAIR Left 01/2015   TONSILLECTOMY  AGE 28   TOTAL KNEE ARTHROPLASTY Right 12/09/2020   Procedure: TOTAL KNEE ARTHROPLASTY;  Surgeon: Melodi Lerner, MD;  Location: [facility];  Service: Orthopedics;  Laterality: Right;    VAGINAL HYSTERECTOMY  AGE 29   WITH BSO    Social History:  reports that she quit smoking about 35 years ago. Her smoking use included cigarettes. She started smoking about 45 years ago. She has never used smokeless tobacco. She  reports current alcohol use of about 3.0 - 4.0 standard drinks of alcohol per week. She reports that she does not use drugs.  Allergies: Allergies  Allergen Reactions   Clonidine Hives    Family History:  Family History  Problem Relation Age of Onset   Colon cancer Neg Hx    Esophageal cancer Neg Hx    Rectal cancer Neg Hx    Stomach cancer Neg Hx    Asthma Neg Hx      Current Outpatient Medications:    albuterol  (VENTOLIN  HFA) 108 (90 Base) MCG/ACT inhaler, TAKE 2 PUFFS BY MOUTH EVERY 6 HOURS AS NEEDED FOR WHEEZE, Disp: 18 each, Rfl: 9   alendronate (FOSAMAX) 70 MG tablet, Take 70 mg by mouth once a week. Take with a full glass of water  on an empty stomach., Disp: , Rfl:    amLODipine  (NORVASC ) 10 MG tablet, TAKE 1 TABLET BY MOUTH EVERY DAY, Disp: 90 tablet, Rfl: 1   budesonide -formoterol  (SYMBICORT ) 160-4.5 MCG/ACT inhaler, Inhale 2 puffs into the lungs in the morning and at bedtime., Disp: 30.6 each, Rfl: 11   celecoxib  (CELEBREX ) 200 MG capsule, Take 200 mg by mouth daily., Disp: , Rfl:    ferrous sulfate  325 (65 FE) MG tablet, TAKE 1 TABLET BY MOUTH  TWICE A DAY WITH FOOD, Disp: 100 tablet, Rfl: 1   hydrochlorothiazide  (HYDRODIURIL ) 12.5 MG tablet, TAKE 1 TABLET BY MOUTH EVERY DAY, Disp: 90 tablet, Rfl: 3   hydroxychloroquine (PLAQUENIL) 200 MG tablet, 1 tab Orally twice a day; Duration: 30 days, Disp: , Rfl:    lidocaine  (LIDODERM ) 5 %, Place 1 patch onto the skin daily. Remove & Discard patch within 12 hours or as directed by MD, Disp: 15 patch, Rfl: 0   losartan  (COZAAR ) 100 MG tablet, TAKE 1 TABLET BY MOUTH EVERY DAY, Disp: 90 tablet, Rfl: 1   metFORMIN  (GLUCOPHAGE ) 500 MG tablet, TAKE 1 TABLET BY MOUTH TWICE A DAY WITH FOOD, Disp: 180 tablet, Rfl: 1   metoprolol  succinate (TOPROL -XL) 50 MG 24 hr tablet, Take 1 tablet (50 mg total) by mouth daily., Disp: 90 tablet, Rfl: 2   omeprazole  (PRILOSEC) 20 MG capsule, TAKE 1 CAPSULE (20 MG TOTAL) BY MOUTH 2 (TWO) TIMES DAILY BEFORE A  MEAL., Disp: 180 capsule, Rfl: 1   rosuvastatin  (CRESTOR ) 10 MG tablet, Take 1 tablet (10 mg total) by mouth at bedtime., Disp: 90 tablet, Rfl: 3   Spacer/Aero-Holding Chambers DEVI, Use with Symbicort  inhaler, Disp: 1 each, Rfl: 2   traMADol  (ULTRAM ) 50 MG tablet, Take by mouth every 6 (six) hours as needed., Disp: , Rfl:    traMADol  (ULTRAM ) 50 MG tablet, Take 1 tablet (50 mg total) by mouth every 6 (six) hours as needed., Disp: 15 tablet, Rfl: 0  Review of Systems:  Negative unless indicated in HPI.   Physical Exam: Vitals:   08/31/24 0909 08/31/24 0930  BP: 130/80 128/78  Pulse: 67   Temp: 98.3 F (36.8 C)   TempSrc: Oral   SpO2: 97%   Weight: 156 lb 8 oz (71 kg)     Body mass index is 29.57 kg/m.   Physical Exam Vitals reviewed.  Constitutional:      Appearance: Normal appearance.  HENT:     Head: Normocephalic and atraumatic.  Eyes:     Conjunctiva/sclera: Conjunctivae normal.  Cardiovascular:     Rate and Rhythm: Normal rate and regular rhythm.  Pulmonary:     Effort: Pulmonary effort is normal.     Breath sounds: Normal breath sounds.  Skin:    General: Skin is warm and dry.  Neurological:     General: No focal deficit present.     Mental Status: She is alert and oriented to person, place, and time.  Psychiatric:        Mood and Affect: Mood normal.        Behavior: Behavior normal.        Thought Content: Thought content normal.        Judgment: Judgment normal.      Impression and Plan:  Type 2 diabetes mellitus without complication, without long-term current use of insulin  (HCC) Assessment & Plan: A1c demonstrates excellent control at 5.8.  Orders: -     POCT glycosylated hemoglobin (Hb A1C)  Essential hypertension Assessment & Plan: Well-controlled.  She is on amlodipine  10 mg and losartan  100 mg daily.   Hyperlipidemia associated with type 2 diabetes mellitus (HCC) Assessment & Plan: Last LDL 74, slightly above goal but likely  acceptable for age.      Time spent:31 minutes reviewing chart, interviewing and examining patient and formulating plan of care.     Tully Theophilus Andrews, MD [facility]

## 2024-09-08 DIAGNOSIS — M79641 Pain in right hand: Secondary | ICD-10-CM | POA: Diagnosis not present

## 2024-09-08 DIAGNOSIS — M25641 Stiffness of right hand, not elsewhere classified: Secondary | ICD-10-CM | POA: Diagnosis not present

## 2024-09-27 DIAGNOSIS — M25641 Stiffness of right hand, not elsewhere classified: Secondary | ICD-10-CM | POA: Diagnosis not present

## 2024-09-27 DIAGNOSIS — M79641 Pain in right hand: Secondary | ICD-10-CM | POA: Diagnosis not present

## 2024-09-28 DIAGNOSIS — M25511 Pain in right shoulder: Secondary | ICD-10-CM | POA: Diagnosis not present

## 2024-09-28 DIAGNOSIS — S62324D Displaced fracture of shaft of fourth metacarpal bone, right hand, subsequent encounter for fracture with routine healing: Secondary | ICD-10-CM | POA: Diagnosis not present

## 2024-11-11 ENCOUNTER — Other Ambulatory Visit: Payer: Self-pay | Admitting: Internal Medicine

## 2024-11-11 DIAGNOSIS — K219 Gastro-esophageal reflux disease without esophagitis: Secondary | ICD-10-CM

## 2024-12-28 ENCOUNTER — Ambulatory Visit: Admitting: Internal Medicine

## 2025-07-27 ENCOUNTER — Ambulatory Visit
